# Patient Record
Sex: Male | Born: 1940 | Marital: Married | Smoking: Former smoker
Health system: Southern US, Community
[De-identification: ages and names within clinical notes are randomized; demographics above are authoritative.]

## PROBLEM LIST (undated history)

## (undated) DIAGNOSIS — E119 Type 2 diabetes mellitus without complications: Secondary | ICD-10-CM

## (undated) DIAGNOSIS — C3490 Malignant neoplasm of unspecified part of unspecified bronchus or lung: Secondary | ICD-10-CM

## (undated) HISTORY — DX: Malignant neoplasm of unspecified part of unspecified bronchus or lung: C34.90

## (undated) HISTORY — DX: Type 2 diabetes mellitus without complications: E11.9

## (undated) SURGERY — BRONCHOSCOPY, WITH FLUOROSCOPY
Anesthesia: Moderate Sedation

---

## 2009-04-04 DEATH — deceased

## 2017-02-23 ENCOUNTER — Other Ambulatory Visit (HOSPITAL_BASED_OUTPATIENT_CLINIC_OR_DEPARTMENT_OTHER): Payer: Self-pay | Admitting: Physician Assistant

## 2017-02-23 DIAGNOSIS — R918 Other nonspecific abnormal finding of lung field: Secondary | ICD-10-CM

## 2017-02-24 ENCOUNTER — Encounter (HOSPITAL_BASED_OUTPATIENT_CLINIC_OR_DEPARTMENT_OTHER): Payer: Self-pay

## 2017-02-24 ENCOUNTER — Ambulatory Visit (HOSPITAL_BASED_OUTPATIENT_CLINIC_OR_DEPARTMENT_OTHER)
Admission: RE | Admit: 2017-02-24 | Discharge: 2017-02-24 | Disposition: A | Discharge status: Home / Self Care | Payer: Medicare Other | Source: Ambulatory Visit | Attending: Physician Assistant | Admitting: Physician Assistant

## 2017-02-24 ENCOUNTER — Other Ambulatory Visit (HOSPITAL_COMMUNITY): Payer: Self-pay | Admitting: Physician Assistant

## 2017-02-24 DIAGNOSIS — I700 Atherosclerosis of aorta: Secondary | ICD-10-CM | POA: Diagnosis not present

## 2017-02-24 DIAGNOSIS — I2510 Atherosclerotic heart disease of native coronary artery without angina pectoris: Secondary | ICD-10-CM | POA: Insufficient documentation

## 2017-02-24 DIAGNOSIS — D491 Neoplasm of unspecified behavior of respiratory system: Secondary | ICD-10-CM | POA: Diagnosis not present

## 2017-02-24 DIAGNOSIS — K760 Fatty (change of) liver, not elsewhere classified: Secondary | ICD-10-CM | POA: Diagnosis not present

## 2017-02-24 DIAGNOSIS — J90 Pleural effusion, not elsewhere classified: Secondary | ICD-10-CM | POA: Insufficient documentation

## 2017-02-24 DIAGNOSIS — R918 Other nonspecific abnormal finding of lung field: Secondary | ICD-10-CM

## 2017-02-24 DIAGNOSIS — C3492 Malignant neoplasm of unspecified part of left bronchus or lung: Secondary | ICD-10-CM

## 2017-02-24 DIAGNOSIS — J432 Centrilobular emphysema: Secondary | ICD-10-CM | POA: Insufficient documentation

## 2017-02-24 DIAGNOSIS — J439 Emphysema, unspecified: Secondary | ICD-10-CM | POA: Diagnosis not present

## 2017-02-24 MED ORDER — IOPAMIDOL (ISOVUE-300) INJECTION 61%
100.0000 mL | Freq: Once | INTRAVENOUS | Status: AC | PRN
  Administered 2017-02-24: 80 mL via INTRAVENOUS

## 2017-03-05 ENCOUNTER — Telehealth: Payer: Self-pay | Admitting: *Deleted

## 2017-03-05 DIAGNOSIS — R918 Other nonspecific abnormal finding of lung field: Secondary | ICD-10-CM | POA: Insufficient documentation

## 2017-03-05 NOTE — Telephone Encounter (Signed)
Oncology Nurse Navigator Documentation  Oncology Nurse Navigator Flowsheets 03/05/2017  Navigator Location [facility]  Referral date to RadOnc/MedOnc 03/05/2017  Navigator Encounter Type Telephone/I received referral on Maurice Tyler today. I called and scheduled him to be seen at [facility] next week on 03/12/17.    Telephone Outgoing Call  Treatment Phase Abnormal Scans  Barriers/Navigation Needs Coordination of Care  Interventions Coordination of Care  Coordination of Care Appts  Acuity Level 1  Time Spent with Patient 30

## 2017-03-07 ENCOUNTER — Encounter (HOSPITAL_COMMUNITY): Payer: Self-pay

## 2017-03-07 ENCOUNTER — Ambulatory Visit (HOSPITAL_COMMUNITY)
Admission: RE | Admit: 2017-03-07 | Discharge: 2017-03-07 | Disposition: A | Discharge status: Home / Self Care | Payer: Medicare Other | Source: Ambulatory Visit | Attending: Physician Assistant | Admitting: Physician Assistant

## 2017-03-07 DIAGNOSIS — Z1389 Encounter for screening for other disorder: Secondary | ICD-10-CM | POA: Diagnosis present

## 2017-03-07 DIAGNOSIS — C3492 Malignant neoplasm of unspecified part of left bronchus or lung: Secondary | ICD-10-CM

## 2017-03-07 LAB — GLUCOSE, CAPILLARY
GLUCOSE-CAPILLARY: 278 mg/dL — AB (ref 65–99)
Glucose-Capillary: 262 mg/dL — ABNORMAL HIGH (ref 65–99)

## 2017-03-09 ENCOUNTER — Encounter: Payer: Self-pay | Admitting: *Deleted

## 2017-03-09 ENCOUNTER — Telehealth: Payer: Self-pay | Admitting: *Deleted

## 2017-03-09 NOTE — Telephone Encounter (Signed)
Oncology Nurse Navigator Documentation  Oncology Nurse Navigator Flowsheets 03/09/2017  Navigator Location [facility]  Navigator Encounter Type Telephone/I called today to follow up on Mr. Insley PET scan. It was supposed to be on 11/3.  He states his blood sugar was to high for scan.  He is going to see his doctor today regarding his sugar and will re-schedule his PET scan.  His appt on 11/8 will be cancelled and re-scheduled after his PET scan.  He verbalized understanding  Telephone Outgoing Call  Treatment Phase Abnormal Scans  Barriers/Navigation Needs Coordination of Care  Interventions Coordination of Care  Coordination of Care Other  Acuity Level 1  Time Spent with Patient 30

## 2017-03-10 ENCOUNTER — Telehealth: Payer: Self-pay | Admitting: *Deleted

## 2017-03-10 ENCOUNTER — Encounter: Payer: Self-pay | Admitting: Pulmonary Disease

## 2017-03-10 ENCOUNTER — Ambulatory Visit (INDEPENDENT_AMBULATORY_CARE_PROVIDER_SITE_OTHER): Payer: Medicare Other | Admitting: Pulmonary Disease

## 2017-03-10 ENCOUNTER — Encounter: Payer: Self-pay | Admitting: *Deleted

## 2017-03-10 VITALS — BP 124/64 | HR 90 | Ht 70.0 in | Wt 182.2 lb

## 2017-03-10 DIAGNOSIS — J90 Pleural effusion, not elsewhere classified: Secondary | ICD-10-CM | POA: Diagnosis not present

## 2017-03-10 DIAGNOSIS — R918 Other nonspecific abnormal finding of lung field: Secondary | ICD-10-CM

## 2017-03-10 NOTE — Progress Notes (Signed)
   Subjective:    Patient ID: Maurice Tyler, male    DOB: 1940-11-13, 76 y.o.   MRN: [id number]  HPI    Review of Systems  Constitutional: Positive for unexpected weight change. Negative for fever.  HENT: Negative for congestion, dental problem, ear pain, nosebleeds, postnasal drip, rhinorrhea, sinus pressure, sneezing, sore throat and trouble swallowing.   Eyes: Negative for redness and itching.  Respiratory: Positive for shortness of breath and wheezing. Negative for cough and chest tightness.   Cardiovascular: Negative for palpitations and leg swelling.  Gastrointestinal: Negative for nausea and vomiting.  Genitourinary: Negative for dysuria.  Musculoskeletal: Negative for joint swelling.  Skin: Negative for rash.  Allergic/Immunologic: Negative.  Negative for environmental allergies, food allergies and immunocompromised state.  Neurological: Negative for headaches.  Hematological: Does not bruise/bleed easily.  Psychiatric/Behavioral: Negative for dysphoric mood. The patient is not nervous/anxious.        Objective:   Physical Exam        Assessment & Plan:

## 2017-03-10 NOTE — Progress Notes (Signed)
Subjective:    Patient ID: Maurice Tyler, male    DOB: 1940-05-13, 76 y.o.   MRN: [id number]  Synopsis: Referred in 2018 in for a lung mass.  HPI Chief Complaint  Patient presents with  . pulm consult    Pt referred by Dr. Mat Carne for pulmonary Mass with possible PE. Pt having SOB with exertion with some wheezing on occassion.    Andray tells me that he was traveling recently with his RV and last week he was up in [place] and he had the sudden onset of cough and dyspnea.  Since then he started to fee a little better.  This was about 3 months ago.  He quit smoking in July and since then he has noticed some improvement in his dyspnea.  He says that there is still something in his chest.  No chest pain, still some dyspnea but it is not as bad recently.  He denies cough or hemoptysis.  He is actually gained 10 pounds in the last several weeks.  He tells me that he started smoking in 1960 when he joined [employer] and smoked 1 pack/day for 58 years until 2018.  He has always worked in Clinical cytogeneticist.  He says that early on in his career he had a significant amount of exposure to jet fuel's, installation used within the jet aircraft, and other chemicals that were used in the jet engine.  After this he worked Mud[employer].  He has never been told that he has a lung problem such as asthma or emphysema.   History reviewed. No pertinent past medical history.   Family History  Problem Relation Age of Onset  . Cancer Mother      Social History   Socioeconomic History  . Marital status: Married    Spouse name: Not on file  . Number of children: Not on file  . Years of education: Not on file  . Highest education level: Not on file  Social Needs  . Financial resource strain: Not on file  . Food insecurity - worry: Not on file  . Food insecurity - inability: Not on file  . Transportation needs - medical: Not on file  . Transportation needs - non-medical: Not on file    Occupational History  . Not on file  Tobacco Use  . Smoking status: Former Smoker    Years: 58.00    Types: Cigarettes    Last attempt to quit: 11/21/2016    Years since quitting: 0.2  . Smokeless tobacco: Never Used  Substance and Sexual Activity  . Alcohol use: No    Frequency: Never  . Drug use: No  . Sexual activity: Not on file  Other Topics Concern  . Not on file  Social History Narrative  . Not on file     No Known Allergies   Outpatient Medications Prior to Visit  Medication Sig Dispense Refill  . albuterol (PROVENTIL HFA;VENTOLIN HFA) 108 (90 Base) MCG/ACT inhaler Inhale 1 puff every 6 (six) hours as needed into the lungs for wheezing or shortness of breath.    . metFORMIN (GLUCOPHAGE) 850 MG tablet Take 850 mg 1 day or 1 dose by mouth.     No facility-administered medications prior to visit.       Review of Systems  Constitutional: Negative for activity change, appetite change, chills and fever.  HENT: Negative for congestion, ear pain, hearing loss, postnasal drip, rhinorrhea, sinus pressure and sneezing.   Eyes: Negative for  redness, itching and visual disturbance.  Respiratory: Positive for shortness of breath. Negative for cough, chest tightness and wheezing.   Cardiovascular: Negative for chest pain, palpitations and leg swelling.  Gastrointestinal: Negative for abdominal distention, abdominal pain, blood in stool, constipation, diarrhea, nausea and vomiting.  Musculoskeletal: Negative for arthralgias, gait problem, joint swelling, myalgias, neck pain and neck stiffness.  Skin: Negative for rash.  Neurological: Negative for dizziness, light-headedness, numbness and headaches.  Hematological: Does not bruise/bleed easily.  Psychiatric/Behavioral: Negative for confusion and dysphoric mood.       Objective:   Physical Exam  Vitals:   03/10/17 1127  BP: 124/64  Pulse: 90  SpO2: 95%  Weight: 182 lb 3.2 oz (82.6 kg)  Height: 5\' 10"  (1.778 m)     Gen: well appearing, no acute distress HENT: NCAT, OP clear, neck supple without masses Eyes: PERRL, EOMi Lymph: no cervical lymphadenopathy PULM: Diminished left lung upper lobe CV: RRR, no mgr, no JVD GI: BS+, soft, nontender, no hsm Derm: no rash or skin breakdown MSK: normal bulk and tone Neuro: A&Ox4, CN II-XII intact, strength 5/5 in all 4 extremities Psyche: normal mood and affect   CBC No results found for: WBC, RBC, HGB, HCT, PLT, MCV, MCH, MCHC, RDW, LYMPHSABS, MONOABS, EOSABS, BASOSABS  Chest imaging: October 2018 CT chest images independently reviewed showing what appears to be a large left upper lobe mass with complete collapse of the left upper lobe this lesion is encasing the pulmonary artery and extending into the subcarinal region of the mediastinum.  There is also a moderate-sized pleural effusion on that side.  He also has mild centrilobular emphysema      Assessment & Plan:    Lung mass - Plan: IR THORACENTESIS ASP PLEURAL SPACE W/IMG GUIDE Pleural fluid  Discussion: I'm very worried about Mr. Teale CT chest.  He has a large left lung mass with a pleural effusion.  Given his smoking history this most likely represents malignancy.  I explained this to him in detail today.  The best approach moving forward is to obtain a thoracentesis to assess the pleural fluid for malignancy.  If that is negative or inconclusive then he will need a bronchoscopy.  He should keep the appointment for the PET scan as previously arranged.  Plan: Lung mass with pleural effusion: I am concerned that this is cancer, but the only way to know for certain is to perform a biopsy We will plan for procedure called a thoracentesis where we will drain fluid from your left lung and send it to the lab If that test is inconclusive then I will make arrangements for a bronchoscopy next week  You will hear from [place] after the results of the thoracentesis are available    Current  Outpatient Medications:  .  albuterol (PROVENTIL HFA;VENTOLIN HFA) 108 (90 Base) MCG/ACT inhaler, Inhale 1 puff every 6 (six) hours as needed into the lungs for wheezing or shortness of breath., Disp: , Rfl:  .  metFORMIN (GLUCOPHAGE) 850 MG tablet, Take 850 mg 1 day or 1 dose by mouth., Disp: , Rfl:

## 2017-03-10 NOTE — Patient Instructions (Signed)
Lung mass with pleural effusion: I am concerned that this is cancer, but the only way to know for certain is to perform a biopsy We will plan for procedure called a thoracentesis where we will drain fluid from your left lung and send it to the lab If that test is inconclusive then I will make arrangements for a bronchoscopy next week  You will hear from [place] after the results of the thoracentesis are available

## 2017-03-12 ENCOUNTER — Other Ambulatory Visit: Payer: TRICARE For Life (TFL)

## 2017-03-12 ENCOUNTER — Ambulatory Visit: Payer: TRICARE For Life (TFL) | Admitting: Internal Medicine

## 2017-03-16 ENCOUNTER — Ambulatory Visit (HOSPITAL_COMMUNITY)
Admission: RE | Admit: 2017-03-16 | Discharge: 2017-03-16 | Disposition: A | Discharge status: Home / Self Care | Payer: Medicare Other | Source: Ambulatory Visit | Attending: Radiology | Admitting: Radiology

## 2017-03-16 ENCOUNTER — Ambulatory Visit (HOSPITAL_COMMUNITY)
Admission: RE | Admit: 2017-03-16 | Discharge: 2017-03-16 | Disposition: A | Discharge status: Home / Self Care | Payer: Medicare Other | Source: Ambulatory Visit | Attending: Pulmonary Disease | Admitting: Pulmonary Disease

## 2017-03-16 ENCOUNTER — Encounter (HOSPITAL_COMMUNITY): Payer: Self-pay | Admitting: Radiology

## 2017-03-16 DIAGNOSIS — I700 Atherosclerosis of aorta: Secondary | ICD-10-CM | POA: Insufficient documentation

## 2017-03-16 DIAGNOSIS — J90 Pleural effusion, not elsewhere classified: Secondary | ICD-10-CM | POA: Diagnosis present

## 2017-03-16 DIAGNOSIS — R918 Other nonspecific abnormal finding of lung field: Secondary | ICD-10-CM

## 2017-03-16 HISTORY — PX: IR THORACENTESIS ASP PLEURAL SPACE W/IMG GUIDE: IMG5380

## 2017-03-16 MED ORDER — LIDOCAINE 2% (20 MG/ML) 5 ML SYRINGE
INTRAMUSCULAR | Status: AC
  Filled 2017-03-16: qty 10

## 2017-03-16 NOTE — Procedures (Signed)
PROCEDURE SUMMARY:  Successful US guided left thoracentesis. Yielded 1.4 L of slightly hazy yellow fluid. Pt tolerated procedure well. No immediate complications.  Specimen was sent for labs. CXR ordered.  Ascencion Dike PA-C 03/16/2017 1:31 PM

## 2017-03-17 ENCOUNTER — Ambulatory Visit: Payer: TRICARE For Life (TFL) | Admitting: Internal Medicine

## 2017-03-17 ENCOUNTER — Telehealth: Payer: Self-pay | Admitting: Internal Medicine

## 2017-03-17 NOTE — Telephone Encounter (Signed)
Received a call from [facility] at [facility] to reschedule the pt's appt. Pt wanted to be rescheduled after PET scan on 11/19. Appt has been rescheduled for the pt to see Dr. [name] on 11/26 at 215pm, 145pm labs. Tammi Klippel will notify the pt.

## 2017-03-20 ENCOUNTER — Telehealth: Payer: Self-pay | Admitting: Pulmonary Disease

## 2017-03-20 NOTE — Telephone Encounter (Signed)
I called Mr. Hamre to discuss the results of his cytology report.  So far no evidence of cancer but some stains are still pending.  Based on this I explained to him that we need to go ahead and arrange for a bronchoscopy at [facility] at some point on Monday, Tue, Wed, or Friday of next week. I would prefer that it be performed earlier in the week so that we can get results sooner.  Will forward to our triage team to start working on this.

## 2017-03-20 NOTE — Telephone Encounter (Signed)
Bronch has been scheduled for 03/25/17 at 8:00a at [facility]. Pt is aware and voiced his understanding.   Will route to BQ to make aware.

## 2017-03-21 NOTE — Telephone Encounter (Signed)
Malachy Moan, The other patient I called you about is Morton Stall #[id number]. Thank you. [name]

## 2017-03-23 ENCOUNTER — Encounter (HOSPITAL_COMMUNITY)
Admission: RE | Admit: 2017-03-23 | Discharge: 2017-03-23 | Disposition: A | Discharge status: Home / Self Care | Payer: Medicare Other | Source: Ambulatory Visit | Attending: Physician Assistant | Admitting: Physician Assistant

## 2017-03-23 DIAGNOSIS — C3492 Malignant neoplasm of unspecified part of left bronchus or lung: Secondary | ICD-10-CM | POA: Diagnosis present

## 2017-03-23 LAB — GLUCOSE, CAPILLARY: Glucose-Capillary: 184 mg/dL — ABNORMAL HIGH (ref 65–99)

## 2017-03-23 MED ORDER — FLUDEOXYGLUCOSE F - 18 (FDG) INJECTION: 8.4000 | Freq: Once | INTRAVENOUS | Status: DC | PRN

## 2017-03-23 NOTE — Telephone Encounter (Signed)
Mohamed, Thanks for the note.  I sent you a note regarding [name]'s bronchoscopy today. Ruby Cola

## 2017-03-24 NOTE — Progress Notes (Signed)
Pt called and given instructions for bronchoscopy in am.  Pt was told to arrive at 06:45 am at the [facility]. Nothing to eat or drink after midnight.  Will bring wife as his adult driver. Pt was given a call back number of [phone].

## 2017-03-25 ENCOUNTER — Encounter (HOSPITAL_COMMUNITY)
Admission: RE | Disposition: A | Discharge status: Home / Self Care | Payer: Self-pay | Source: Ambulatory Visit | Attending: Pulmonary Disease

## 2017-03-25 ENCOUNTER — Ambulatory Visit (HOSPITAL_COMMUNITY)
Admission: RE | Admit: 2017-03-25 | Discharge: 2017-03-25 | Disposition: A | Discharge status: Home / Self Care | Payer: Medicare Other | Source: Ambulatory Visit | Attending: Pulmonary Disease | Admitting: Pulmonary Disease

## 2017-03-25 ENCOUNTER — Ambulatory Visit (HOSPITAL_COMMUNITY): Payer: Medicare Other

## 2017-03-25 ENCOUNTER — Encounter (HOSPITAL_COMMUNITY): Payer: Self-pay | Admitting: Respiratory Therapy

## 2017-03-25 DIAGNOSIS — R918 Other nonspecific abnormal finding of lung field: Secondary | ICD-10-CM

## 2017-03-25 DIAGNOSIS — J90 Pleural effusion, not elsewhere classified: Secondary | ICD-10-CM | POA: Diagnosis not present

## 2017-03-25 DIAGNOSIS — Z79899 Other long term (current) drug therapy: Secondary | ICD-10-CM | POA: Insufficient documentation

## 2017-03-25 DIAGNOSIS — Z7984 Long term (current) use of oral hypoglycemic drugs: Secondary | ICD-10-CM | POA: Diagnosis not present

## 2017-03-25 DIAGNOSIS — C3402 Malignant neoplasm of left main bronchus: Secondary | ICD-10-CM | POA: Diagnosis not present

## 2017-03-25 DIAGNOSIS — Z87891 Personal history of nicotine dependence: Secondary | ICD-10-CM | POA: Diagnosis not present

## 2017-03-25 DIAGNOSIS — Z9889 Other specified postprocedural states: Secondary | ICD-10-CM

## 2017-03-25 HISTORY — PX: VIDEO BRONCHOSCOPY: SHX5072

## 2017-03-25 LAB — CBC
HEMATOCRIT: 44.5 % (ref 39.0–52.0)
HEMOGLOBIN: 15.5 g/dL (ref 13.0–17.0)
MCH: 31.4 pg (ref 26.0–34.0)
MCHC: 34.8 g/dL (ref 30.0–36.0)
MCV: 90.1 fL (ref 78.0–100.0)
Platelets: 290 10*3/uL (ref 150–400)
RBC: 4.94 MIL/uL (ref 4.22–5.81)
RDW: 12.4 % (ref 11.5–15.5)
WBC: 9.2 10*3/uL (ref 4.0–10.5)

## 2017-03-25 LAB — GLUCOSE, CAPILLARY: Glucose-Capillary: 165 mg/dL — ABNORMAL HIGH (ref 65–99)

## 2017-03-25 SURGERY — BRONCHOSCOPY, WITH FLUOROSCOPY
Anesthesia: Moderate Sedation | Laterality: Bilateral

## 2017-03-25 MED ORDER — FENTANYL CITRATE (PF) 100 MCG/2ML IJ SOLN
INTRAMUSCULAR | Status: DC | PRN
  Administered 2017-03-25: 25 ug via INTRAVENOUS
  Administered 2017-03-25: 50 ug via INTRAVENOUS
  Administered 2017-03-25: 25 ug via INTRAVENOUS

## 2017-03-25 MED ORDER — LIDOCAINE HCL 1 % IJ SOLN
INTRAMUSCULAR | Status: DC | PRN
  Administered 2017-03-25: 6 mL via RESPIRATORY_TRACT

## 2017-03-25 MED ORDER — MIDAZOLAM HCL 5 MG/ML IJ SOLN
INTRAMUSCULAR | Status: AC
  Filled 2017-03-25: qty 2

## 2017-03-25 MED ORDER — LIDOCAINE HCL 2 % EX GEL: 1.0000 "application " | Freq: Once | CUTANEOUS | Status: DC

## 2017-03-25 MED ORDER — FENTANYL CITRATE (PF) 100 MCG/2ML IJ SOLN
INTRAMUSCULAR | Status: AC
  Filled 2017-03-25: qty 4

## 2017-03-25 MED ORDER — LIDOCAINE HCL 2 % EX GEL
CUTANEOUS | Status: DC | PRN
  Administered 2017-03-25: 1

## 2017-03-25 MED ORDER — SODIUM CHLORIDE 0.9 % IV SOLN
INTRAVENOUS | Status: DC
  Administered 2017-03-25: 08:00:00 via INTRAVENOUS

## 2017-03-25 MED ORDER — PHENYLEPHRINE HCL 0.25 % NA SOLN
NASAL | Status: DC | PRN
  Administered 2017-03-25: 2 via NASAL

## 2017-03-25 MED ORDER — PHENYLEPHRINE HCL 0.25 % NA SOLN: 1.0000 | Freq: Four times a day (QID) | NASAL | Status: DC | PRN

## 2017-03-25 MED ORDER — BUTAMBEN-TETRACAINE-BENZOCAINE 2-2-14 % EX AERO: 1.0000 | INHALATION_SPRAY | Freq: Once | CUTANEOUS | Status: DC

## 2017-03-25 MED ORDER — MIDAZOLAM HCL 10 MG/2ML IJ SOLN
INTRAMUSCULAR | Status: DC | PRN
  Administered 2017-03-25 (×3): 1 mg via INTRAVENOUS
  Administered 2017-03-25: 2 mg via INTRAVENOUS

## 2017-03-25 NOTE — Discharge Instructions (Signed)
Flexible Bronchoscopy, Care After These instructions give you information on caring for yourself after your procedure. Your doctor may also give you more specific instructions. Call your doctor if you have any problems or questions after your procedure. Follow these instructions at home:  Do not eat or drink anything for 2 hours after your procedure. If you try to eat or drink before the medicine wears off, food or drink could go into your lungs. You could also burn yourself.  After 2 hours have passed and when you can cough and gag normally, you may eat soft food and drink liquids slowly.  The day after the test, you may eat your normal diet.  You may do your normal activities.  Keep all doctor visits. Get help right away if:  You get more and more short of breath.  You get light-headed.  You feel like you are going to pass out (faint).  You have chest pain.  You have new problems that worry you.  You cough up more than a little blood.  You cough up more blood than before. This information is not intended to replace advice given to you by your health care provider. Make sure you discuss any questions you have with your health care provider. Document Released: 02/16/2009 Document Revised: 09/27/2015 Document Reviewed: 12/24/2012 Elsevier Interactive Patient Education  2017 [employer].  Nothing to eat or drink until  11:00 am today   03/25/2017.

## 2017-03-25 NOTE — H&P (Signed)
Maurice Tyler is an 76 y.o. male.   Chief Complaint: lung mass  HPI: 76 y/o smoker presented to clinic with cough, found to have a left pleural effusion and lung mass.  Thoracentesis was non-diagnostic.  Here for bronchoscopy today.  History reviewed. No pertinent past medical history.  Past Surgical History:  Procedure Laterality Date  . IR THORACENTESIS ASP PLEURAL SPACE W/IMG GUIDE  03/16/2017    Family History  Problem Relation Age of Onset  . Cancer Mother    Social History:  reports that he quit smoking about 4 months ago. His smoking use included cigarettes. He quit after 58.00 years of use. he has never used smokeless tobacco. He reports that he does not drink alcohol or use drugs.  Allergies: No Known Allergies  Medications Prior to Admission  Medication Sig Dispense Refill  . albuterol (PROVENTIL HFA;VENTOLIN HFA) 108 (90 Base) MCG/ACT inhaler Inhale 1 puff every 6 (six) hours as needed into the lungs for wheezing or shortness of breath.    . cetirizine (ZYRTEC) 10 MG tablet Take 10 mg daily by mouth. ALLERTEC    . metFORMIN (GLUCOPHAGE) 500 MG tablet Take 500 mg 2 (two) times daily with a meal by mouth.       Results for orders placed or performed during the hospital encounter of 03/23/17 (from the past 48 hour(s))  Glucose, capillary     Status: Abnormal   Collection Time: 03/23/17  8:44 AM  Result Value Ref Range   Glucose-Capillary 184 (H) 65 - 99 mg/dL   Nm Pet Image Initial (pi) Skull Base To Thigh  Result Date: 03/23/2017 CLINICAL DATA:  Initial treatment strategy for left lung mass and pleural effusion. EXAM: NUCLEAR MEDICINE PET SKULL BASE TO THIGH TECHNIQUE: 8.4 mCi F-18 FDG was injected intravenously. Full-ring PET imaging was performed from the skull base to thigh after the radiotracer. CT data was obtained and used for attenuation correction and anatomic localization. FASTING BLOOD GLUCOSE:  Value: 184 mg/dl COMPARISON:  Chest CT on 02/24/2017 FINDINGS:  NECK:  No hypermetabolic lymph nodes or masses. CHEST: A large hypermetabolic mass is seen within the central left lung, which involves the left hilum and mediastinum. This causes obstruction of the left mainstem bronchus, and complete postobstructive collapse of the left lung. This mass is difficult to measure due to surrounding left lung collapse, but has SUV max of 12.6. Moderate left pleural effusion is seen, without evidence of hypermetabolic chest wall mass. No hypermetabolic right hilar or supraclavicular lymph nodes. Mild right lung emphysema noted, however no suspicious right lung nodules or masses identified. ABDOMEN/PELVIS: No abnormal hypermetabolic activity within the liver, pancreas, adrenal glands, or spleen. No hypermetabolic lymph nodes in the abdomen or pelvis. Moderately enlarged prostate gland is seen with relatively diffuse hypermetabolic activity, with SUV max of 7.7. This may be due to prostatitis or high-grade prostate carcinoma. Multiple calcified gallstones are seen as well as mild to moderate diffuse hepatic steatosis. Aortic atherosclerosis. Sigmoid diverticulosis, without evidence of diverticulitis. SKELETON: No focal hypermetabolic bone lesions to suggest skeletal metastasis. IMPRESSION: Large central hypermetabolic left lung mass with involvement of the left hilum and mediastinum, and complete postobstructive left lung collapse. Moderate left pleural effusion, likely malignant. Recommend correlation with cytology. No evidence of extra-thoracic metastatic disease. Moderately enlarged hypermetabolic prostate gland, which may be due to prostatitis or high-grade prostate carcinoma. Recommend correlation with PSA, and consider prostate MRI or biopsy. Other incidental findings described above. Electronically Signed   By: Sharrie Rothman.D.  On: 03/23/2017 12:06    ROS  Blood pressure 135/65, temperature 97.7 F (36.5 C), resp. rate (!) 22, SpO2 96 %. Physical Exam   Gen: well  appearing HENT: OP clear, TM's clear, neck supple PULM: Diminished left lung B, normal percussion CV: RRR, no mgr, trace edema GI: BS+, soft, nontender Derm: no cyanosis or rash Psyche: normal mood and affect   CBC    Component Value Date/Time   WBC 9.2 03/25/2017 0810   RBC 4.94 03/25/2017 0810   HGB 15.5 03/25/2017 0810   HCT 44.5 03/25/2017 0810   PLT 290 03/25/2017 0810   MCV 90.1 03/25/2017 0810   MCH 31.4 03/25/2017 0810   MCHC 34.8 03/25/2017 0810   RDW 12.4 03/25/2017 0810   PET CT images reviewed, showing left lung mass which is hypermetabolic    Assessment/Plan  Lung mass: plan bronchoscopy, will send stat CBC to assess PLT count  Simonne Maffucci, MD 03/25/2017, 7:52 AM

## 2017-03-25 NOTE — Progress Notes (Signed)
Video Bronchoscopy done' Intervention Bronchial washing  Intervention bronchial biopsy Intervention Bronchial  Brushing  Procedure tolerated well

## 2017-03-25 NOTE — Op Note (Signed)
[name] Cardiopulmonary Patient Name: Maurice Tyler Procedure Date: 03/25/2017 MRN: [id number] Attending MD: Juanito Doom , MD Date of Birth: 1941-04-15 CSN: [id number] Age: 76 Admit Type: Outpatient Ethnicity: Unknown Procedure:            Bronchoscopy Indications:          Left mainstem mass Providers:            Nathaneil Canary B. Lake Bells, MD, Andre Lefort RRT,RCP,                        Ashley Mariner RRT,RCP Referring MD:          Medicines:            Midazolam 5 mg IV, Fentanyl 100 mcg IV, Lidocaine                        applied to nares and subglottic space Complications:        No immediate complications Estimated Blood Loss: Estimated blood loss was minimal. Procedure:      Pre-Anesthesia Assessment:      - A History and Physical has been performed. Patient meds and allergies       have been reviewed. The risks and benefits of the procedure and the       sedation options and risks were discussed with the patient. All       questions were answered and informed consent was obtained. Patient       identification and proposed procedure were verified prior to the       procedure by the physician and the technician in the procedure room.       Mental Status Examination: alert and oriented. Airway Examination:       normal oropharyngeal airway. Respiratory Examination: clear to       auscultation. CV Examination: normal. ASA Grade Assessment: II - A       patient with mild systemic disease. After reviewing the risks and       benefits, the patient was deemed in satisfactory condition to undergo       the procedure. The anesthesia plan was to use moderate sedation /       analgesia (conscious sedation). Immediately prior to administration of       medications, the patient was re-assessed for adequacy to receive       sedatives. The heart rate, respiratory rate, oxygen saturations, blood       pressure, adequacy of pulmonary ventilation, and response to  care were       monitored throughout the procedure. The physical status of the patient       was re-assessed after the procedure.      After obtaining informed consent, the bronchoscope was passed under       direct vision. Throughout the procedure, the patient's blood pressure,       pulse, and oxygen saturations were monitored continuously. the [id number]       ([id number]) scope was introduced through the left nostril and advanced to       the tracheobronchial tree. The procedure was accomplished without       difficulty. The patient tolerated the procedure well. The total duration       of the procedure was 15 minutes. Findings:      The nasopharynx/oropharynx appears normal. The larynx had a small nodule       on the left see  image. The vocal cords appear normal. The subglottic       space is normal. The trachea is of normal caliber. The carina is sharp.       The tracheobronchial tree of the right lung was examined to at least the       first subsegmental level. Bronchial mucosa and anatomy in the right lung       are normal; there are no endobronchial lesions, and no secretions.      Left Lung Abnormalities: A partially obstructing (about 90% obstructed)       mass was found in the middle portion in the left mainstem bronchus. The       mass was large and bloody, endobronchial and friable. The lesion was not       traversed. Endobronchial biopsies were performed in the left mainstem       bronchus using forceps and sent for histopathology examination. 6 were       obtained. Fluoroscopically guided transbronchial brushings were obtained       in the left mainstem bronchus and sent for routine cytology. Three       samples were obtained. Washings were obtained in the left mainstem       bronchus and sent for routine cytology. The return was blood-tinged. Impression:      - Left laryngeal nodule      - Left mainstem mass      - The right lung was normal.      - A bloody, endobronchial and  friable mass was found in the left       mainstem bronchus. This lesion is likely malignant.      - An endobronchial biopsy was performed.      - Fluoroscopically guided transbronchial brushings were obtained.      - Washings were obtained. Moderate Sedation:      Moderate (conscious) sedation was personally administered by the       endoscopist. The following parameters were monitored: oxygen saturation,       heart rate, blood pressure, and response to care. Total physician       intraservice time was 24 minutes. Recommendation:      - Await biopsy and cytology results. Procedure Code(s):      --- Professional ---      [phone], Bronchoscopy, rigid or flexible, including fluoroscopic guidance,       when performed; with bronchial or endobronchial biopsy(s), single or       multiple sites      31623, Bronchoscopy, rigid or flexible, including fluoroscopic guidance,       when performed; with brushing or protected brushings      99152, Moderate sedation services provided by the same physician or       other qualified health care professional performing the diagnostic or       therapeutic service that the sedation supports, requiring the presence       of an independent trained observer to assist in the monitoring of the       patient's level of consciousness and physiological status; initial 15       minutes of intraservice time, patient age 26 years or older      [phone], Moderate sedation services; each additional 15 minutes       intraservice time Diagnosis Code(s):      --- Professional ---      R91.8, Other nonspecific abnormal finding of lung field      J98.9,  Respiratory disorder, unspecified CPT copyright 2016 American Medical Association. All rights reserved. The codes documented in this report are preliminary and upon coder review may  be revised to meet current compliance requirements. Norlene Campbell, MD Juanito Doom, MD 03/25/2017 9:18:37 AM This report has been  signed electronically. Number of Addenda: 0 Scope In: 8:52:06 AM Scope Out: 9:07:07 AM

## 2017-03-27 ENCOUNTER — Telehealth: Payer: Self-pay | Admitting: Pulmonary Disease

## 2017-03-27 ENCOUNTER — Encounter (HOSPITAL_COMMUNITY): Payer: Self-pay | Admitting: Pulmonary Disease

## 2017-03-27 NOTE — Telephone Encounter (Signed)
Thank you :)

## 2017-03-27 NOTE — Telephone Encounter (Signed)
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  Further tissue stains are pending.  I requested PDL-1 testing.  He will see Dr. [name] next week.  He voiced understanding.

## 2017-03-30 ENCOUNTER — Ambulatory Visit (HOSPITAL_BASED_OUTPATIENT_CLINIC_OR_DEPARTMENT_OTHER): Payer: Medicare Other | Admitting: Internal Medicine

## 2017-03-30 ENCOUNTER — Other Ambulatory Visit (HOSPITAL_BASED_OUTPATIENT_CLINIC_OR_DEPARTMENT_OTHER): Payer: Medicare Other

## 2017-03-30 ENCOUNTER — Encounter: Payer: Self-pay | Admitting: Internal Medicine

## 2017-03-30 VITALS — BP 136/59 | HR 114 | Temp 97.6°F | Resp 20 | Ht 70.0 in | Wt 166.7 lb

## 2017-03-30 DIAGNOSIS — C3412 Malignant neoplasm of upper lobe, left bronchus or lung: Secondary | ICD-10-CM

## 2017-03-30 DIAGNOSIS — C3492 Malignant neoplasm of unspecified part of left bronchus or lung: Secondary | ICD-10-CM

## 2017-03-30 DIAGNOSIS — J910 Malignant pleural effusion: Secondary | ICD-10-CM | POA: Diagnosis not present

## 2017-03-30 DIAGNOSIS — R5382 Chronic fatigue, unspecified: Secondary | ICD-10-CM

## 2017-03-30 DIAGNOSIS — C3490 Malignant neoplasm of unspecified part of unspecified bronchus or lung: Secondary | ICD-10-CM

## 2017-03-30 DIAGNOSIS — R918 Other nonspecific abnormal finding of lung field: Secondary | ICD-10-CM

## 2017-03-30 DIAGNOSIS — Z7189 Other specified counseling: Secondary | ICD-10-CM

## 2017-03-30 DIAGNOSIS — R634 Abnormal weight loss: Secondary | ICD-10-CM | POA: Diagnosis not present

## 2017-03-30 DIAGNOSIS — E46 Unspecified protein-calorie malnutrition: Secondary | ICD-10-CM | POA: Diagnosis not present

## 2017-03-30 DIAGNOSIS — Z5111 Encounter for antineoplastic chemotherapy: Secondary | ICD-10-CM

## 2017-03-30 DIAGNOSIS — Z5112 Encounter for antineoplastic immunotherapy: Secondary | ICD-10-CM

## 2017-03-30 HISTORY — DX: Malignant neoplasm of unspecified part of unspecified bronchus or lung: C34.90

## 2017-03-30 LAB — COMPREHENSIVE METABOLIC PANEL
ALBUMIN: 3.4 g/dL — AB (ref 3.5–5.0)
ALK PHOS: 88 U/L (ref 40–150)
ALT: 15 U/L (ref 0–55)
AST: 13 U/L (ref 5–34)
Anion Gap: 14 mEq/L — ABNORMAL HIGH (ref 3–11)
BUN: 17.7 mg/dL (ref 7.0–26.0)
CALCIUM: 10.2 mg/dL (ref 8.4–10.4)
CO2: 26 mEq/L (ref 22–29)
Chloride: 98 mEq/L (ref 98–109)
Creatinine: 0.9 mg/dL (ref 0.7–1.3)
GLUCOSE: 131 mg/dL (ref 70–140)
POTASSIUM: 5 meq/L (ref 3.5–5.1)
SODIUM: 138 meq/L (ref 136–145)
Total Bilirubin: 0.54 mg/dL (ref 0.20–1.20)
Total Protein: 7.2 g/dL (ref 6.4–8.3)

## 2017-03-30 LAB — CBC WITH DIFFERENTIAL/PLATELET
BASO%: 0.4 % (ref 0.0–2.0)
Basophils Absolute: 0 10*3/uL (ref 0.0–0.1)
EOS%: 0.4 % (ref 0.0–7.0)
Eosinophils Absolute: 0 10*3/uL (ref 0.0–0.5)
HEMATOCRIT: 46.7 % (ref 38.4–49.9)
HGB: 15.7 g/dL (ref 13.0–17.1)
LYMPH#: 1.2 10*3/uL (ref 0.9–3.3)
LYMPH%: 12.8 % — ABNORMAL LOW (ref 14.0–49.0)
MCH: 30.4 pg (ref 27.2–33.4)
MCHC: 33.6 g/dL (ref 32.0–36.0)
MCV: 90.3 fL (ref 79.3–98.0)
MONO#: 1 10*3/uL — AB (ref 0.1–0.9)
MONO%: 10.6 % (ref 0.0–14.0)
NEUT%: 75.8 % — ABNORMAL HIGH (ref 39.0–75.0)
NEUTROS ABS: 7.2 10*3/uL — AB (ref 1.5–6.5)
PLATELETS: 292 10*3/uL (ref 140–400)
RBC: 5.17 10*6/uL (ref 4.20–5.82)
RDW: 12.9 % (ref 11.0–14.6)
WBC: 9.5 10*3/uL (ref 4.0–10.3)

## 2017-03-30 MED ORDER — PROCHLORPERAZINE MALEATE 10 MG PO TABS: 10.0000 mg | ORAL_TABLET | Freq: Four times a day (QID) | ORAL | 0 refills | Status: DC | PRN

## 2017-03-30 NOTE — Progress Notes (Signed)
START ON PATHWAY REGIMEN - Non-Small Cell Lung     A cycle is every 21 days:     Paclitaxel      Carboplatin   **Always confirm dose/schedule in your pharmacy ordering system**    Patient Characteristics: Stage IV Metastatic, Squamous, PS = 0, 1, First Line, PD-L1 Expression Positive 1-49% (TPS) / Negative / Not Tested / Not a Candidate for Immunotherapy/Awaiting Test Results AJCC T Category: T4 Current Disease Status: Distant Metastases AJCC N Category: N2 AJCC M Category: M1a AJCC 8 Stage Grouping: IVA Histology: Squamous Cell Line of therapy: First Line PD-L1 Expression Status: Awaiting Test Results Performance Status: PS = 0, 1 Would you be surprised if this patient died  in the next year<= I would NOT be surprised if this patient died in the next year Intent of Therapy: Non-Curative / Palliative Intent, Discussed with Patient

## 2017-03-30 NOTE — Progress Notes (Signed)
[facility] [phone]   [phone]  CONSULT NOTE  REFERRING PHYSICIAN: Dr. Simonne Maffucci  REASON FOR CONSULTATION:  76 years old white male recently diagnosed with lung cancer.  HPI Maurice Tyler is a 76 y.o. male with long history for smoking and past medical history significant for diabetes mellitus.  The patient mentions that 3 months ago while he was vacationing and traveling with his RV, he went out to smoke a cigarette and had severe cough to the point that he decided to quit smoking at that time.  He has also been complaining of persistent cough and shortness of breath since that time.  He also started losing weight.  He was seen by his primary care physician and CT scan of the chest was performed on February 24, 2017 and that showed a mass felt to arise from the left upper lobe and extend into and invades the left hilum, encasing the left main and left upper lobe pulmonary arteries.  There may be adjacent adenopathy in the left hilum and subcarinal regions.  The area of questionable adenopathy in the left hilar region measures 3.4 x 2.3 cm.  There is mass in the middle mediastinum which abuts and may invade the esophagus measuring 2.4 x 2.0 cm.  The scan also showed moderate pleural effusion on the left.  There was airspace consolidation throughout the left upper lobe with essentially collapsed left upper lobe likely due to tumor invading and obstructing the left main bronchus.  The area of tumor versus adjacent atelectasis/pneumonitis was difficult to ascertain.  The area felt to be most suspicious for tumor in the posterior segment of the left upper lobe medially and measured 4.1 x 3.3 cm.  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 On March 16, 2017 the patient underwent ultrasound-guided left thoracentesis with drainage of 1.4 L of pleural fluid.  The final cytology was not conclusive for malignancy. A PET scan was performed in March 23, 2017 and that showed large central hypermetabolic left lung mass with involvement of the left hilum and mediastinum and complete postobstructive left lung collapse.  There was also moderate left pleural effusion likely malignant.  There was also moderately enlarged hypermetabolic prostate gland which may be due to prostatitis or high-grade prostate carcinoma.  There was no evidence of extrathoracic metastatic disease. On April 24, 2017 the patient underwent bronchoscopy under the care of Dr. 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 Dr. Lake Bells kindly refer the patient to me today for evaluation and recommendation regarding treatment of his condition. When seen today the patient is feeling fine except for the weight loss of around 20 pounds in the last 3 months.  He denied having any chest pain, shortness of breath, cough or hemoptysis.  He denied having any nausea, vomiting, diarrhea or constipation.  He has no headache or visual changes. Family history significant for mother with liver cancer, father died from old age. The patient is married and has 2 children.  He used to work as in Office manager.  He has a history of smoking 1 pack/day for around 60 years and quit November 12, 2016.  The patient denied having any history of alcohol or drug abuse.  HPI  Past Medical History:  Diagnosis Date  . Diabetes mellitus without complication ([place])   . Malignant neoplasm of unspecified part of unspecified bronchus or lung ([place]) 03/30/2017    Past  Surgical History:  Procedure Laterality Date  . IR THORACENTESIS ASP PLEURAL SPACE W/IMG GUIDE  03/16/2017  . VIDEO BRONCHOSCOPY Bilateral 03/25/2017   Procedure: VIDEO BRONCHOSCOPY WITH FLUORO;  Surgeon: Juanito Doom, MD;  Location: [facility];  Service: Cardiopulmonary;  Laterality: Bilateral;    Family History  Problem Relation Age of Onset  .  Cancer Mother     Social History Social History   Tobacco Use  . Smoking status: Former Smoker    Years: 58.00    Types: Cigarettes    Last attempt to quit: 11/21/2016    Years since quitting: 0.3  . Smokeless tobacco: Never Used  Substance Use Topics  . Alcohol use: No    Frequency: Never  . Drug use: No    No Known Allergies  Current Outpatient Medications  Medication Sig Dispense Refill  . albuterol (PROVENTIL HFA;VENTOLIN HFA) 108 (90 Base) MCG/ACT inhaler Inhale 1 puff every 6 (six) hours as needed into the lungs for wheezing or shortness of breath.    . cetirizine (ZYRTEC) 10 MG tablet Take 10 mg daily by mouth. ALLERTEC    . metFORMIN (GLUCOPHAGE) 500 MG tablet Take 500 mg 2 (two) times daily with a meal by mouth.      No current facility-administered medications for this visit.     Review of Systems  Constitutional: positive for anorexia, fatigue and weight loss Eyes: negative Ears, nose, mouth, throat, and face: negative Respiratory: negative Cardiovascular: negative Gastrointestinal: negative Genitourinary:negative Integument/breast: negative Hematologic/lymphatic: negative Musculoskeletal:negative Neurological: negative Behavioral/Psych: negative Endocrine: negative Allergic/Immunologic: negative  Physical Exam  BTD:VVOHY, healthy, no distress, well nourished, well developed and anxious SKIN: skin color, texture, turgor are normal, no rashes or significant lesions HEAD: Normocephalic, No masses, lesions, tenderness or abnormalities EYES: normal, PERRLA, Conjunctiva are pink and non-injected EARS: External ears normal, Canals clear OROPHARYNX:no exudate, no erythema and lips, buccal mucosa, and tongue normal  NECK: supple, no adenopathy, no JVD LYMPH:  no palpable lymphadenopathy, no hepatosplenomegaly LUNGS: clear to auscultation , and palpation HEART: regular rate & rhythm, no murmurs and no gallops ABDOMEN:abdomen soft, non-tender, normal bowel  sounds and no masses or organomegaly BACK: Back symmetric, no curvature., No CVA tenderness EXTREMITIES:no joint deformities, effusion, or inflammation, no edema, no skin discoloration  NEURO: alert & oriented x 3 with fluent speech, no focal motor/sensory deficits  PERFORMANCE STATUS: ECOG 1  LABORATORY DATA: Lab Results  Component Value Date   WBC 9.5 03/30/2017   HGB 15.7 03/30/2017   HCT 46.7 03/30/2017   MCV 90.3 03/30/2017   PLT 292 03/30/2017      Chemistry   No results found for: NA, K, CL, CO2, BUN, CREATININE, GLU No results found for: CALCIUM, ALKPHOS, AST, ALT, BILITOT     RADIOGRAPHIC STUDIES: Dg Chest 1 View  Result Date: 03/16/2017 CLINICAL DATA:  As post left-sided thoracentesis with removal of 1.5 L of fluid. EXAM: CHEST 1 VIEW COMPARISON:  CT scan chest of February 24, 2017 and chest x-ray of February 20, 2017. FINDINGS: There is complete opacification of the left hemithorax. There is abrupt tapering of the caliber of the left mainstem bronchus. There is shift of the mediastinum toward the left with hyperinflation of the right lung. The right lung is clear. There is calcification in the wall of the aortic arch. The heart borders are obscured. IMPRESSION: Total opacification of the left hemithorax presumably with fluid and postobstructive atelectasis. An abrupt tapering of the left mainstem bronchus is observed. Compensatory hyperinflation of  the right lung. Thoracic aortic atherosclerosis. Electronically Signed   By: David  Martinique M.D.   On: 03/16/2017 13:31   Nm Pet Image Initial (pi) Skull Base To Thigh  Result Date: 03/23/2017 CLINICAL DATA:  Initial treatment strategy for left lung mass and pleural effusion. EXAM: NUCLEAR MEDICINE PET SKULL BASE TO THIGH TECHNIQUE: 8.4 mCi F-18 FDG was injected intravenously. Full-ring PET imaging was performed from the skull base to thigh after the radiotracer. CT data was obtained and used for attenuation correction and anatomic  localization. FASTING BLOOD GLUCOSE:  Value: 184 mg/dl COMPARISON:  Chest CT on 02/24/2017 FINDINGS: NECK:  No hypermetabolic lymph nodes or masses. CHEST: A large hypermetabolic mass is seen within the central left lung, which involves the left hilum and mediastinum. This causes obstruction of the left mainstem bronchus, and complete postobstructive collapse of the left lung. This mass is difficult to measure due to surrounding left lung collapse, but has SUV max of 12.6. Moderate left pleural effusion is seen, without evidence of hypermetabolic chest wall mass. No hypermetabolic right hilar or supraclavicular lymph nodes. Mild right lung emphysema noted, however no suspicious right lung nodules or masses identified. ABDOMEN/PELVIS: No abnormal hypermetabolic activity within the liver, pancreas, adrenal glands, or spleen. No hypermetabolic lymph nodes in the abdomen or pelvis. Moderately enlarged prostate gland is seen with relatively diffuse hypermetabolic activity, with SUV max of 7.7. This may be due to prostatitis or high-grade prostate carcinoma. Multiple calcified gallstones are seen as well as mild to moderate diffuse hepatic steatosis. Aortic atherosclerosis. Sigmoid diverticulosis, without evidence of diverticulitis. SKELETON: No focal hypermetabolic bone lesions to suggest skeletal metastasis. IMPRESSION: Large central hypermetabolic left lung mass with involvement of the left hilum and mediastinum, and complete postobstructive left lung collapse. Moderate left pleural effusion, likely malignant. Recommend correlation with cytology. No evidence of extra-thoracic metastatic disease. Moderately enlarged hypermetabolic prostate gland, which may be due to prostatitis or high-grade prostate carcinoma. Recommend correlation with PSA, and consider prostate MRI or biopsy. Other incidental findings described above. Electronically Signed   By: Earle Gell M.D.   On: 03/23/2017 12:06   Ir Thoracentesis Asp Pleural  Space W/img Guide  Result Date: 03/16/2017 INDICATION: Left-sided pulmonary mass. Left-sided pleural effusion. Request for diagnostic and therapeutic thoracentesis. EXAM: ULTRASOUND GUIDED LEFT THORACENTESIS MEDICATIONS: None. COMPLICATIONS: None immediate. Postprocedural chest x-ray negative for pneumothorax. PROCEDURE: An ultrasound guided thoracentesis was thoroughly discussed with the patient and questions answered. The benefits, risks, alternatives and complications were also discussed. The patient understands and wishes to proceed with the procedure. Written consent was obtained. Ultrasound was performed to localize and mark an adequate pocket of fluid in the left chest. The area was then prepped and draped in the normal sterile fashion. 1% Lidocaine was used for local anesthesia. Under ultrasound guidance a Safe-T-Centesis catheter was introduced. Thoracentesis was performed. The catheter was removed and a dressing applied. FINDINGS: A total of approximately 1.4 L of hazy, yellow colored fluid was removed. Samples were sent to the laboratory as requested by the clinical team. IMPRESSION: Successful ultrasound guided left thoracentesis yielding 1.4 L of pleural fluid. Read by: Ascencion Dike PA-C Electronically Signed   By: Aletta Edouard M.D.   On: 03/16/2017 13:43   Dg C-arm Bronchoscopy  Result Date: 03/25/2017 C-ARM BRONCHOSCOPY: Fluoroscopy was utilized by the requesting physician.  No radiographic interpretation.    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   PLAN: I had a lengthy discussion with the patient today about his current disease status, prognosis and treatment options. I personally and independently reviewed  the scan images and discussed the results and showed the images to the patient today. I recommended for the patient to complete the staging workup by ordering MRI of the brain to rule out brain metastasis. I explained to the patient that he has an incurable condition and all the treatment will be of palliative nature. I gave him the option of palliative care versus consideration of palliative systemic chemotherapy with carboplatin for AC of 5, paclitaxel 175 mg/M2 and Keytruda 200 mg IV every 3 weeks. I discussed with the patient the adverse effect of the chemotherapy including but not limited to alopecia, myelosuppression, nausea and vomiting, peripheral neuropathy, liver or renal dysfunction, in addition to the adverse effect of immunotherapy including but not limited to immunotherapy mediated skin rash, diarrhea, inflammation of the lung, kidney, liver, thyroid or other endocrine dysfunction. The patient is interested in proceeding with systemic chemotherapy.  He is expected to start the first cycle of this treatment on April 15, 2017. I also referred the patient to radiation oncology for consideration of short course of palliative radiotherapy to the central obstructing left upper lobe lung mass. For the recurrent left pleural effusion, I may consider referring the patient to thoracic surgery for consideration of Pleurx catheter placement if he has recurrence of his fluid. For the malnutrition and weight loss, I referred the patient to the dietitian at the cancer center for evaluation and management of his condition. We will arrange for the patient to have a chemotherapy education class before the first dose of his treatment. I would call his pharmacy with prescription for Compazine 10 mg p.o. every 6 hours as needed for nausea. The patient will come back for follow-up visit in 2 weeks for evaluation before starting the first cycle of his treatment. He was advised to call immediately if he has any  concerning symptoms in the interval. The patient voices understanding of current disease status and treatment options and is in agreement with the current care plan.  All questions were answered. The patient knows to call the clinic with any problems, questions or concerns. We can certainly see the patient much sooner if necessary.  Thank you so much for allowing me to participate in the care of Maurice Tyler. I will continue to follow up the patient with you and assist in his care.  I spent 55 minutes counseling the patient face to face. The total time spent in the appointment was 80 minutes.  Disclaimer: This note was dictated with voice recognition software. Similar sounding words can inadvertently be transcribed and may not be corrected upon review.   Eilleen Kempf March 30, 2017, 2:17 PM

## 2017-03-31 ENCOUNTER — Telehealth: Payer: Self-pay | Admitting: Internal Medicine

## 2017-03-31 ENCOUNTER — Encounter: Payer: Self-pay | Admitting: Radiation Oncology

## 2017-03-31 NOTE — Telephone Encounter (Signed)
Scheduled appt per 11/26 los - patient is aware of appt date and time and rad onc consult appt given as well.

## 2017-04-03 NOTE — Progress Notes (Signed)
Thoracic Location of Tumor / Histology: Left upper  Lobe lung mass in addition to mediastinal and left hilar adenopathy  And pleural effusion  Patient presented months ago with symptoms of: persistent cough and shortness breath   Biopsies of  (if applicable) revealed: Diagnosis 03/25/17:Dr. McQuaid, MdBronchus, biopsy, left - POORLY DIFFERENTIATED NON-SMALL CELL CARCINOMA.  Tobacco/Marijuana/Snuff/ETOH use:   Past/Anticipated interventions by cardiothoracic surgery, if any: 03/16/17 U/S guided thoracentesis with 1.4 liter drainage,   Past/Anticipated interventions by medical oncology, if any: Dr. [name]  May need pleurx  catheter placement , referral for palliative radiation to central obstructing left upper lobe lung mass ,1st chemotherapy infusion 04/15/17 paclitaxel Carboplatin every 21 days  MRI brain scheduled 04/08/17  Signs/Symptoms  Weight changes, if any: 20 lb weight loss past 3 months, difficulkty swallowing foods, eats soft foods, 25% or less appetite  Respiratory complaints, if any: Shortness breath   Hemoptysis, if any: no  Pain issues, if any:  Discomfort when lying  down in bed  SAFETY ISSUES:  Prior radiation? No  Pacemaker/ICD?  No  Is the patient on methotrexate? No Current Complaints / other details:  DM ;58 years cigarette smoking, last attempt to quit 11/21/16,no smokeless tobacco,no alcohol or drug use, wears b/l hearing aids BP 117/63   Pulse 100   Temp 97.6 F (36.4 C) (Oral)   Resp 20   Ht 5\' 10"  (1.778 m)   Wt 160 lb 3.2 oz (72.7 kg)   SpO2 99%   BMI 22.99 kg/m   Wt Readings from Last 3 Encounters:  04/09/17 160 lb 3.2 oz (72.7 kg)  04/07/17 161 lb (73 kg)  03/30/17 166 lb 11.2 oz (75.6 kg)

## 2017-04-07 ENCOUNTER — Encounter: Payer: Self-pay | Admitting: *Deleted

## 2017-04-07 ENCOUNTER — Other Ambulatory Visit: Payer: Self-pay | Admitting: Cardiothoracic Surgery

## 2017-04-07 ENCOUNTER — Other Ambulatory Visit: Payer: TRICARE For Life (TFL)

## 2017-04-07 ENCOUNTER — Other Ambulatory Visit: Payer: Self-pay

## 2017-04-07 ENCOUNTER — Encounter: Payer: Self-pay | Admitting: Cardiothoracic Surgery

## 2017-04-07 ENCOUNTER — Institutional Professional Consult (permissible substitution) (INDEPENDENT_AMBULATORY_CARE_PROVIDER_SITE_OTHER): Payer: Medicare Other | Admitting: Cardiothoracic Surgery

## 2017-04-07 VITALS — BP 100/61 | HR 100 | Ht 70.0 in | Wt 161.0 lb

## 2017-04-07 DIAGNOSIS — C3492 Malignant neoplasm of unspecified part of left bronchus or lung: Secondary | ICD-10-CM | POA: Diagnosis not present

## 2017-04-07 DIAGNOSIS — J90 Pleural effusion, not elsewhere classified: Secondary | ICD-10-CM | POA: Diagnosis not present

## 2017-04-07 DIAGNOSIS — C3490 Malignant neoplasm of unspecified part of unspecified bronchus or lung: Secondary | ICD-10-CM

## 2017-04-07 NOTE — Progress Notes (Signed)
[address]       [place] [address]             [phone]                    Christophr Pike [place] Medical Record #[id number] Date of Birth: 11-15-1940  Referring: Curt Bears, MD Primary Care: [name], MD  Chief Complaint:   No chief complaint on file.   History of Present Illness:    Maurice Tyler 76 y.o. male is seen in the office  today for possible placement of a Pleurx catheter.  Patient presents with advanced stage lung cancer with complete collapse of the left lung and probable impingement on the distal esophagus in addition. Patient has a long history of smoking and also a history of diabetes.  For the past several months he has had increasing shortness of breath and cough and weight loss.  CT scan done October 23 showed a mass in the left lung invading into the left hilum with extensive adenopathy.  No mass in the middle mediastinum which abuts the esophagus.  On November 12 thoracentesis was performed at 1.4 L of fluid was removed.  Bronchoscopy was performed by Dr. 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 The patient is unable to take solid food.  With the degree of left lung collapse that he has he notes that he is not bothered by shortness of breath much, but also likely that he has not been very active. He has no current films to evaluate the degree of pleural effusion versus left lung collapse to review on his visit today.  Current Activity/ Functional Status:  Patient is independent with mobility/ambulation, transfers, ADL's, IADL's.   Zubrod Score: At the time of surgery this patient's most appropriate activity status/level should be described as: []     0    Normal activity, no symptoms []     1    Restricted in physical strenuous activity but ambulatory, able to do out light work []     2    Ambulatory and capable of self  care, unable to do work activities, up and about               >50 % of waking hours                              []     3    Only limited self care, in bed greater than 50% of waking hours []     4    Completely disabled, no self care, confined to bed or chair []     5    Moribund   Past Medical History:  Diagnosis Date  . Diabetes mellitus without complication ([place])   . Malignant neoplasm of unspecified part of unspecified bronchus or lung ([place]) 03/30/2017    Past Surgical History:  Procedure Laterality Date  . IR THORACENTESIS ASP PLEURAL SPACE W/IMG GUIDE  03/16/2017  . VIDEO BRONCHOSCOPY Bilateral 03/25/2017   Procedure: VIDEO BRONCHOSCOPY WITH FLUORO;  Surgeon: Juanito Doom, MD;  Location: [facility];  Service: Cardiopulmonary;  Laterality: Bilateral;    Family History  Problem Relation Age of Onset  . Cancer Mother     Social History   Socioeconomic History  . Marital status: Married    Spouse name: Not on file  .  Number of children: Not on file  . Years of education: Not on file  . Highest education level: Not on file  Social Needs  . Financial resource strain: Not on file  . Food insecurity - worry: Not on file  . Food insecurity - inability: Not on file  . Transportation needs - medical: Not on file  . Transportation needs - non-medical: Not on file  Occupational History  . Not on file  Tobacco Use  . Smoking status: Former Smoker    Years: 58.00    Types: Cigarettes    Last attempt to quit: 11/21/2016    Years since quitting: 0.3  . Smokeless tobacco: Never Used  Substance and Sexual Activity  . Alcohol use: No    Frequency: Never  . Drug use: No  . Sexual activity: Not on file  Other Topics Concern  . Not on file  Social History Narrative  . Not on file    Social History   Tobacco Use  Smoking Status Former Smoker  . Years: 58.00  . Types: Cigarettes  . Last attempt to quit: 11/21/2016  . Years since quitting: 0.3  Smokeless Tobacco  Never Used    Social History   Substance and Sexual Activity  Alcohol Use No  . Frequency: Never     No Known Allergies  Current Outpatient Medications  Medication Sig Dispense Refill  . albuterol (PROVENTIL HFA;VENTOLIN HFA) 108 (90 Base) MCG/ACT inhaler Inhale 1 puff every 6 (six) hours as needed into the lungs for wheezing or shortness of breath.    . cetirizine (ZYRTEC) 10 MG tablet Take 10 mg daily by mouth. ALLERTEC    . metFORMIN (GLUCOPHAGE) 500 MG tablet Take 500 mg 2 (two) times daily with a meal by mouth.     . prochlorperazine (COMPAZINE) 10 MG tablet Take 1 tablet (10 mg total) by mouth every 6 (six) hours as needed for nausea or vomiting. 30 tablet 0   No current facility-administered medications for this visit.     Pertinent items are noted in HPI.   Review of Systems:     Cardiac Review of Systems: Y or N  Chest Pain [    ]  Resting SOB [   ] Exertional SOB  [  ]  Orthopnea [  ]   Pedal Edema [   ]    Palpitations [  ] Syncope  [  ]   Presyncope [   ]  General Review of Systems: [Y] = yes [  ]=no Constitional: recent weight change [  ];  Wt loss over the last 3 months [   ] anorexia [  ]; fatigue [  ]; nausea [  ]; night sweats [  ]; fever [  ]; or chills [  ];          Dental: poor dentition[  ]; Last Dentist visit:   Eye : blurred vision [  ]; diplopia [   ]; vision changes [  ];  Amaurosis fugax[  ]; Resp: cough [  ];  wheezing[  ];  hemoptysis[  ]; shortness of breath[  ]; paroxysmal nocturnal dyspnea[  ]; dyspnea on exertion[  ]; or orthopnea[  ];  GI:  gallstones[  ], vomiting[  ];  dysphagia[  ]; melena[  ];  hematochezia [  ]; heartburn[  ];   Hx of  Colonoscopy[  ]; GU: kidney stones [  ]; hematuria[  ];   dysuria [  ];  nocturia[  ];  history of     obstruction [  ]; urinary frequency [  ]             Skin: rash, swelling[  ];, hair loss[  ];  peripheral edema[  ];  or itching[  ]; Musculosketetal: myalgias[  ];  joint swelling[  ];  joint erythema[  ];   joint pain[  ];  back pain[  ];  Heme/Lymph: bruising[  ];  bleeding[  ];  anemia[  ];  Neuro: TIA[  ];  headaches[  ];  stroke[  ];  vertigo[  ];  seizures[  ];   paresthesias[  ];  difficulty walking[  ];  Psych:depression[  ]; anxiety[  ];  Endocrine: diabetes[  ];  thyroid dysfunction[  ];  Immunizations: Flu up to date [  ]; Pneumococcal up to date [  ];  Other:  Physical Exam: There were no vitals taken for this visit.  PHYSICAL EXAMINATION: General appearance: alert, cooperative, appears older than stated age, cachectic and no distress Head: Normocephalic, without obvious abnormality, atraumatic Neck: no adenopathy, no carotid bruit, no JVD, supple, symmetrical, trachea midline and thyroid not enlarged, symmetric, no tenderness/mass/nodules Lymph nodes: Cervical, supraclavicular, and axillary nodes normal. Resp: diminished breath sounds LLL and LUL Back: symmetric, no curvature. ROM normal. No CVA tenderness. Cardio: regular rate and rhythm, S1, S2 normal, no murmur, click, rub or gallop GI: soft, non-tender; bowel sounds normal; no masses,  no organomegaly Extremities: extremities normal, atraumatic, no cyanosis or edema and Homans sign is negative, no sign of DVT Neurologic: Grossly normal  Diagnostic Studies & Laboratory data:     Recent Radiology Findings:   Dg Chest 1 View  Result Date: 03/16/2017 CLINICAL DATA:  As post left-sided thoracentesis with removal of 1.5 L of fluid. EXAM: CHEST 1 VIEW COMPARISON:  CT scan chest of February 24, 2017 and chest x-ray of February 20, 2017. FINDINGS: There is complete opacification of the left hemithorax. There is abrupt tapering of the caliber of the left mainstem bronchus. There is shift of the mediastinum toward the left with hyperinflation of the right lung. The right lung is clear. There is calcification in the wall of the aortic arch. The heart borders are obscured. IMPRESSION: Total opacification of the left hemithorax presumably  with fluid and postobstructive atelectasis. An abrupt tapering of the left mainstem bronchus is observed. Compensatory hyperinflation of the right lung. Thoracic aortic atherosclerosis. Electronically Signed   By: David  Martinique M.D.   On: 03/16/2017 13:31   Nm Pet Image Initial (pi) Skull Base To Thigh  Result Date: 03/23/2017 CLINICAL DATA:  Initial treatment strategy for left lung mass and pleural effusion. EXAM: NUCLEAR MEDICINE PET SKULL BASE TO THIGH TECHNIQUE: 8.4 mCi F-18 FDG was injected intravenously. Full-ring PET imaging was performed from the skull base to thigh after the radiotracer. CT data was obtained and used for attenuation correction and anatomic localization. FASTING BLOOD GLUCOSE:  Value: 184 mg/dl COMPARISON:  Chest CT on 02/24/2017 FINDINGS: NECK:  No hypermetabolic lymph nodes or masses. CHEST: A large hypermetabolic mass is seen within the central left lung, which involves the left hilum and mediastinum. This causes obstruction of the left mainstem bronchus, and complete postobstructive collapse of the left lung. This mass is difficult to measure due to surrounding left lung collapse, but has SUV max of 12.6. Moderate left pleural effusion is seen, without evidence of hypermetabolic chest wall mass. No hypermetabolic right hilar or supraclavicular lymph  nodes. Mild right lung emphysema noted, however no suspicious right lung nodules or masses identified. ABDOMEN/PELVIS: No abnormal hypermetabolic activity within the liver, pancreas, adrenal glands, or spleen. No hypermetabolic lymph nodes in the abdomen or pelvis. Moderately enlarged prostate gland is seen with relatively diffuse hypermetabolic activity, with SUV max of 7.7. This may be due to prostatitis or high-grade prostate carcinoma. Multiple calcified gallstones are seen as well as mild to moderate diffuse hepatic steatosis. Aortic atherosclerosis. Sigmoid diverticulosis, without evidence of diverticulitis. SKELETON: No focal  hypermetabolic bone lesions to suggest skeletal metastasis. IMPRESSION: Large central hypermetabolic left lung mass with involvement of the left hilum and mediastinum, and complete postobstructive left lung collapse. Moderate left pleural effusion, likely malignant. Recommend correlation with cytology. No evidence of extra-thoracic metastatic disease. Moderately enlarged hypermetabolic prostate gland, which may be due to prostatitis or high-grade prostate carcinoma. Recommend correlation with PSA, and consider prostate MRI or biopsy. Other incidental findings described above. Electronically Signed   By: Earle Gell M.D.   On: 03/23/2017 12:06   Ir Thoracentesis Asp Pleural Space W/img Guide  Result Date: 03/16/2017 INDICATION: Left-sided pulmonary mass. Left-sided pleural effusion. Request for diagnostic and therapeutic thoracentesis. EXAM: ULTRASOUND GUIDED LEFT THORACENTESIS MEDICATIONS: None. COMPLICATIONS: None immediate. Postprocedural chest x-ray negative for pneumothorax. PROCEDURE: An ultrasound guided thoracentesis was thoroughly discussed with the patient and questions answered. The benefits, risks, alternatives and complications were also discussed. The patient understands and wishes to proceed with the procedure. Written consent was obtained. Ultrasound was performed to localize and mark an adequate pocket of fluid in the left chest. The area was then prepped and draped in the normal sterile fashion. 1% Lidocaine was used for local anesthesia. Under ultrasound guidance a Safe-T-Centesis catheter was introduced. Thoracentesis was performed. The catheter was removed and a dressing applied. FINDINGS: A total of approximately 1.4 L of hazy, yellow colored fluid was removed. Samples were sent to the laboratory as requested by the clinical team. IMPRESSION: Successful ultrasound guided left thoracentesis yielding 1.4 L of pleural fluid. Read by: Ascencion Dike PA-C Electronically Signed   By: Aletta Edouard M.D.   On: 03/16/2017 13:43   Dg C-arm Bronchoscopy  Result Date: 03/25/2017 C-ARM BRONCHOSCOPY: Fluoroscopy was utilized by the requesting physician.  No radiographic interpretation.     I have independently reviewed the above radiologic studies.  Recent Lab Findings: Lab Results  Component Value Date   WBC 9.5 03/30/2017   HGB 15.7 03/30/2017   HCT 46.7 03/30/2017   PLT 292 03/30/2017   GLUCOSE 131 03/30/2017   ALT 15 03/30/2017   AST 13 03/30/2017   NA 138 03/30/2017   K 5.0 03/30/2017   CREATININE 0.9 03/30/2017   BUN 17.7 03/30/2017   CO2 26 03/30/2017      Assessment / Plan:   Advanced stage non-small cell lung cancer with extensive mediastinal involvement on the left and possible esophageal involvement.  I discussed with the patient and his wife the possibility of use of a Pleurx catheter she developed increased pleural fluid that become symptomatic.  At this point I suspect his greatest limitation will be maintaining her nutritional status during radiation and chemotherapy and may require feeding tube. We will obtain a current chest x-ray tomorrow when he has the MRI of his brain He is scheduled for a CT for radiation planning later this week, I will attempt to use this to decide about placement of a Pleurx now or wait and see how much of the white out  of the left chest is collapsed lung and how much is fluid.  I have discussed with the patient and his wife the option of Pleurx catheter placement and the procedure to place it.  If we decide he needs it can be arranged quickly.       I  spent 30 minutes counseling the patient face to face and 50% or more the  time was spent in counseling and coordination of care. The total time spent in the appointment was 40 minutes.  Grace Isaac MD      [place] [place] [address] Office [phone]   [place] [phone]  04/07/2017 2:56 PM

## 2017-04-08 ENCOUNTER — Ambulatory Visit (HOSPITAL_COMMUNITY)
Admission: RE | Admit: 2017-04-08 | Discharge: 2017-04-08 | Disposition: A | Discharge status: Home / Self Care | Payer: Medicare Other | Source: Ambulatory Visit | Attending: Internal Medicine | Admitting: Internal Medicine

## 2017-04-08 ENCOUNTER — Ambulatory Visit (HOSPITAL_COMMUNITY)
Admission: RE | Admit: 2017-04-08 | Discharge: 2017-04-08 | Disposition: A | Discharge status: Home / Self Care | Payer: Medicare Other | Source: Ambulatory Visit | Attending: Cardiothoracic Surgery | Admitting: Cardiothoracic Surgery

## 2017-04-08 DIAGNOSIS — C3490 Malignant neoplasm of unspecified part of unspecified bronchus or lung: Secondary | ICD-10-CM

## 2017-04-08 DIAGNOSIS — I6782 Cerebral ischemia: Secondary | ICD-10-CM | POA: Diagnosis not present

## 2017-04-08 MED ORDER — GADOBENATE DIMEGLUMINE 529 MG/ML IV SOLN
15.0000 mL | Freq: Once | INTRAVENOUS | Status: AC | PRN
  Administered 2017-04-08: 15 mL via INTRAVENOUS

## 2017-04-09 ENCOUNTER — Other Ambulatory Visit: Payer: TRICARE For Life (TFL)

## 2017-04-09 ENCOUNTER — Ambulatory Visit
Admission: RE | Admit: 2017-04-09 | Discharge: 2017-04-09 | Disposition: A | Discharge status: Home / Self Care | Payer: Medicare Other | Source: Ambulatory Visit | Attending: Radiation Oncology | Admitting: Radiation Oncology

## 2017-04-09 ENCOUNTER — Telehealth: Payer: Self-pay | Admitting: *Deleted

## 2017-04-09 ENCOUNTER — Encounter: Payer: Self-pay | Admitting: Radiation Oncology

## 2017-04-09 VITALS — BP 117/63 | HR 100 | Temp 97.6°F | Resp 20 | Ht 70.0 in | Wt 160.2 lb

## 2017-04-09 DIAGNOSIS — Z7984 Long term (current) use of oral hypoglycemic drugs: Secondary | ICD-10-CM | POA: Insufficient documentation

## 2017-04-09 DIAGNOSIS — E119 Type 2 diabetes mellitus without complications: Secondary | ICD-10-CM | POA: Diagnosis not present

## 2017-04-09 DIAGNOSIS — Z510 Encounter for antineoplastic radiation therapy: Secondary | ICD-10-CM | POA: Diagnosis present

## 2017-04-09 DIAGNOSIS — C3412 Malignant neoplasm of upper lobe, left bronchus or lung: Secondary | ICD-10-CM

## 2017-04-09 DIAGNOSIS — Z87891 Personal history of nicotine dependence: Secondary | ICD-10-CM | POA: Insufficient documentation

## 2017-04-09 DIAGNOSIS — R590 Localized enlarged lymph nodes: Secondary | ICD-10-CM | POA: Insufficient documentation

## 2017-04-09 DIAGNOSIS — Z809 Family history of malignant neoplasm, unspecified: Secondary | ICD-10-CM | POA: Insufficient documentation

## 2017-04-09 DIAGNOSIS — J90 Pleural effusion, not elsewhere classified: Secondary | ICD-10-CM | POA: Diagnosis not present

## 2017-04-09 DIAGNOSIS — Z79899 Other long term (current) drug therapy: Secondary | ICD-10-CM | POA: Diagnosis not present

## 2017-04-09 NOTE — Addendum Note (Signed)
Encounter addended by: Doreen Beam, RN on: 04/09/2017 8:28 AM  Actions taken: Visit diagnoses modified

## 2017-04-09 NOTE — Progress Notes (Signed)
Please see the Nurse Progress Note in the MD Initial Consult Encounter for this patient. 

## 2017-04-09 NOTE — Telephone Encounter (Signed)
Called [name] RN Chemo education [phone], patient is double booked at 36 am, he is scheduled for his ct simulation and education at 10:00am,, today;  Patient and wife prefer  Tuesday at 9:30 am. Abigail Butts RN notified of preference, thanked Rn for changing his  Chemo education to Tuesday at 9:30am 8:46 AM

## 2017-04-09 NOTE — Progress Notes (Signed)
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   HISTORY OF PRESENT ILLNESS: Maurice Tyler is a 76 y.o. male seen at the request of Dr. Earlie Server for a new diagnosis of a left lung cancer. Of note, the patient has an extensive social history or smoking. He states that he quit smoking approximately four months ago after a 58-year history of cigarette usage. Around the time of mid-October 2018 he presented to his primary care physician complaining of a persistent cough and worsening shortness of breath. A CXR and subsequent CT Chest was performed at that time which was notable for a large lung mass on the left measured 4.1 x 3.3 cm, and within the  hilum and mediastinum, there was a 2.4 x 2 cm mass invading the esophagus, and 3.4 x 2.3 cm of hilar adenopathy. He had a moderate left pleural effusion and underwent thoracentesis on 03/16/17. Cytology was negative. He had a PET scan on 03/23/17 which revealed hypermetabolism of his large central left lung mass, and involvement of the hilum and mediastinum and complete postobstructive left lung collapse. His left effusion was also noted and likely malignant. He also had a hypermetabolic prostate gland which was concerning for prostatitis versus prostate cancer. He underwent bronchoscopy with Dr. Lake Bells on 03/25/17, and pathology of this left lung tumor was consistent with squamous cell carcinoma. He has met with Dr. [name] and yesterday, he completed his staging imaging with MRI brian which was negative for evidence of metastasis. He is clinically felt to be stage IV and he comes today to discuss palliative radiation with concurrent palliative  chemotherapy.      PREVIOUS RADIATION THERAPY: No   PAST MEDICAL HISTORY:  Past Medical History:  Diagnosis Date  . Diabetes mellitus without complication ([place])   . Malignant neoplasm of unspecified part of unspecified bronchus or lung ([place]) 03/30/2017       PAST SURGICAL HISTORY: Past Surgical History:  Procedure Laterality Date  . IR THORACENTESIS ASP PLEURAL SPACE W/IMG GUIDE  03/16/2017  . VIDEO BRONCHOSCOPY Bilateral 03/25/2017   Procedure: VIDEO BRONCHOSCOPY WITH FLUORO;  Surgeon: Juanito Doom, MD;  Location: [facility];  Service: Cardiopulmonary;  Laterality: Bilateral;     FAMILY HISTORY:  Family History  Problem Relation Age of Onset  . Cancer Mother      SOCIAL HISTORY:  reports that he quit smoking about 4 months ago. His smoking use included cigarettes. He quit after 58.00 years of use. he has never used smokeless tobacco. He reports that he does not drink alcohol or use drugs.   ALLERGIES: Patient has no known allergies.   MEDICATIONS:  Current Outpatient Medications  Medication Sig Dispense Refill  . albuterol (PROVENTIL HFA;VENTOLIN HFA) 108 (90 Base) MCG/ACT inhaler Inhale 1 puff every 6 (six) hours as needed into the lungs for wheezing or shortness of breath.    . metFORMIN (GLUCOPHAGE) 500 MG tablet Take 500 mg 2 (two) times daily with a meal by mouth.     . nystatin (MYCOSTATIN) 100000 UNIT/ML suspension SWISH AND SWALLOW 5 MLS PO  AC AND QHS FOR 7 DAYS  0  . ACCU-CHEK FASTCLIX LANCETS MISC     .  ACCU-CHEK GUIDE test strip USE TO CHECK BLOOD SUGAR BID  3  . cetirizine (ZYRTEC) 10 MG tablet Take 10 mg daily by mouth. ALLERTEC    . prochlorperazine (COMPAZINE) 10 MG tablet Take 1 tablet (10 mg total) by mouth every 6 (six) hours as needed for nausea or vomiting. (Patient not taking: Reported on 04/07/2017) 30 tablet 0   No current facility-administered medications for this encounter.      REVIEW OF SYSTEMS: On review of systems, the  patient reports that he is doing well overall. He denies any chest pain, shortness of breath, cough, fevers, chills, night sweats, unintended weight changes. He reports some mild orthopnea, and reports he is not ready to proceed with another thoracentesis. He denies any bowel or bladder disturbances, and denies abdominal pain, nausea or vomiting. He denies any new musculoskeletal or joint aches or pains. A complete review of systems is obtained and is otherwise negative.     PHYSICAL EXAM:  Wt Readings from Last 3 Encounters:  04/09/17 160 lb 3.2 oz (72.7 kg)  04/07/17 161 lb (73 kg)  03/30/17 166 lb 11.2 oz (75.6 kg)   Temp Readings from Last 3 Encounters:  04/09/17 97.6 F (36.4 C) (Oral)  03/30/17 97.6 F (36.4 C)  03/25/17 97.7 F (36.5 C)   BP Readings from Last 3 Encounters:  04/09/17 117/63  04/07/17 100/61  03/30/17 (!) 136/59   Pulse Readings from Last 3 Encounters:  04/09/17 100  04/07/17 100  03/30/17 (!) 114   Pain Assessment Pain Score: 2  Pain Loc: Chest(discomfort wjhen lying down)/10  In general this is a well appearing caucasian male in no acute distress. He is alert and oriented x4 and appropriate throughout the examination. HEENT reveals that the patient is normocephalic, atraumatic. EOMs are intact. PERRLA. Skin is intact without any evidence of gross lesions. Cardiovascular exam reveals a regular rate and rhythm, no clicks rubs or murmurs are auscultated. Chest is clear to auscultation on the right, with decrease in the left mid and lower fields. Lymphatic assessment is performed and does not reveal any adenopathy in the cervical, supraclavicular, axillary, or inguinal chains. Abdomen has active bowel sounds in all quadrants and is intact. The abdomen is soft, non tender, non distended. Lower extremities are negative for pretibial pitting edema, deep calf tenderness, cyanosis or clubbing.   ECOG = 1  0 - Asymptomatic (Fully active, able to carry on all  predisease activities without restriction)  1 - Symptomatic but completely ambulatory (Restricted in physically strenuous activity but ambulatory and able to carry out work of a light or sedentary nature. For example, light housework, office work)  2 - Symptomatic, <50% in bed during the day (Ambulatory and capable of all self care but unable to carry out any work activities. Up and about more than 50% of waking hours)  3 - Symptomatic, >50% in bed, but not bedbound (Capable of only limited self-care, confined to bed or chair 50% or more of waking hours)  4 - Bedbound (Completely disabled. Cannot carry on any self-care. Totally confined to bed or chair)  5 - Death   Eustace Pen MM, Creech RH, Tormey DC, et al. [phone]). "Toxicity and response criteria of the [facility]". [place] Oncol. 5 (6): 649-55    LABORATORY DATA:  Lab Results  Component Value Date   WBC 9.5 03/30/2017   HGB 15.7 03/30/2017   HCT 46.7 03/30/2017   MCV 90.3 03/30/2017   PLT 292 03/30/2017  Lab Results  Component Value Date   NA 138 03/30/2017   K 5.0 03/30/2017   CO2 26 03/30/2017   Lab Results  Component Value Date   ALT 15 03/30/2017   AST 13 03/30/2017   ALKPHOS 88 03/30/2017   BILITOT 0.54 03/30/2017      RADIOGRAPHY: Dg Chest 1 View  Result Date: 03/16/2017 CLINICAL DATA:  As post left-sided thoracentesis with removal of 1.5 L of fluid. EXAM: CHEST 1 VIEW COMPARISON:  CT scan chest of February 24, 2017 and chest x-ray of February 20, 2017. FINDINGS: There is complete opacification of the left hemithorax. There is abrupt tapering of the caliber of the left mainstem bronchus. There is shift of the mediastinum toward the left with hyperinflation of the right lung. The right lung is clear. There is calcification in the wall of the aortic arch. The heart borders are obscured. IMPRESSION: Total opacification of the left hemithorax presumably with fluid and postobstructive atelectasis.  An abrupt tapering of the left mainstem bronchus is observed. Compensatory hyperinflation of the right lung. Thoracic aortic atherosclerosis. Electronically Signed   By: David  Martinique M.D.   On: 03/16/2017 13:31   Dg Chest 2 View  Result Date: 04/08/2017 CLINICAL DATA:  Recent diagnosis of lung cancer. EXAM: CHEST  2 VIEW COMPARISON:  PET CT 03/23/2017.  Chest x-ray 03/16/2017. FINDINGS: Continued complete opacification of the left hemithorax, stable. Right lung is clear. Heart likely normal size. No acute bony abnormality. IMPRESSION: Stable complete opacification of the left hemithorax. Electronically Signed   By: Rolm Baptise M.D.   On: 04/08/2017 12:16   Mr Jeri Cos FW Contrast  Result Date: 04/08/2017 CLINICAL DATA:  Stage IV squamous cell lung cancer.  Staging. EXAM: MRI HEAD WITHOUT AND WITH CONTRAST TECHNIQUE: Multiplanar, multiecho pulse sequences of the brain and surrounding structures were obtained without and with intravenous contrast. CONTRAST:  77m MULTIHANCE GADOBENATE DIMEGLUMINE 529 MG/ML IV SOLN COMPARISON:  None. FINDINGS: Some sequences are mildly motion degraded. Brain: There is no evidence of acute infarct, intracranial hemorrhage, mass, midline shift, or extra-axial fluid collection. There is moderate cerebral and cerebellar atrophy. Minimal T2 hyperintensity in the cerebral white matter bilaterally is nonspecific but may reflect chronic small vessel ischemic disease, not greater than expected for patient's age. 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 Dilated perivascular spaces are noted in both cerebral hemispheres. No abnormal enhancement is identified. Vascular: Major intracranial vascular flow voids are preserved, with the right vertebral artery being dominant. Skull and upper cervical spine: Unremarkable bone marrow signal. Sinuses/Orbits: Unremarkable orbits.  No significant sinus disease. Other: None. IMPRESSION: 1. No evidence of intracranial metastases. 2.  Minimal chronic small vessel ischemic changes. Electronically Signed   By: ALogan BoresM.D.   On: 04/08/2017 11:35   Nm Pet Image Initial (pi) Skull Base To Thigh  Result Date: 03/23/2017 CLINICAL DATA:  Initial treatment strategy for left lung mass and pleural effusion. EXAM: NUCLEAR MEDICINE PET SKULL BASE TO THIGH TECHNIQUE: 8.4 mCi F-18 FDG was injected intravenously. Full-ring PET imaging was performed from the skull base to thigh after the radiotracer. CT data was obtained and used for attenuation correction and anatomic localization. FASTING BLOOD GLUCOSE:  Value: 184 mg/dl COMPARISON:  Chest CT on 02/24/2017 FINDINGS: NECK:  No hypermetabolic lymph nodes or masses. CHEST: A large hypermetabolic mass is seen within the central left lung, which involves the left hilum and mediastinum. This causes obstruction of the left mainstem bronchus, and complete  postobstructive collapse of the left lung. This mass is difficult to measure due to surrounding left lung collapse, but has SUV max of 12.6. Moderate left pleural effusion is seen, without evidence of hypermetabolic chest wall mass. No hypermetabolic right hilar or supraclavicular lymph nodes. Mild right lung emphysema noted, however no suspicious right lung nodules or masses identified. ABDOMEN/PELVIS: No abnormal hypermetabolic activity within the liver, pancreas, adrenal glands, or spleen. No hypermetabolic lymph nodes in the abdomen or pelvis. Moderately enlarged prostate gland is seen with relatively diffuse hypermetabolic activity, with SUV max of 7.7. This may be due to prostatitis or high-grade prostate carcinoma. Multiple calcified gallstones are seen as well as mild to moderate diffuse hepatic steatosis. Aortic atherosclerosis. Sigmoid diverticulosis, without evidence of diverticulitis. SKELETON: No focal hypermetabolic bone lesions to suggest skeletal metastasis. IMPRESSION: Large central hypermetabolic left lung mass with involvement of the left  hilum and mediastinum, and complete postobstructive left lung collapse. Moderate left pleural effusion, likely malignant. Recommend correlation with cytology. No evidence of extra-thoracic metastatic disease. Moderately enlarged hypermetabolic prostate gland, which may be due to prostatitis or high-grade prostate carcinoma. Recommend correlation with PSA, and consider prostate MRI or biopsy. Other incidental findings described above. Electronically Signed   By: Earle Gell M.D.   On: 03/23/2017 12:06   Ir Thoracentesis Asp Pleural Space W/img Guide  Result Date: 03/16/2017 INDICATION: Left-sided pulmonary mass. Left-sided pleural effusion. Request for diagnostic and therapeutic thoracentesis. EXAM: ULTRASOUND GUIDED LEFT THORACENTESIS MEDICATIONS: None. COMPLICATIONS: None immediate. Postprocedural chest x-ray negative for pneumothorax. PROCEDURE: An ultrasound guided thoracentesis was thoroughly discussed with the patient and questions answered. The benefits, risks, alternatives and complications were also discussed. The patient understands and wishes to proceed with the procedure. Written consent was obtained. Ultrasound was performed to localize and mark an adequate pocket of fluid in the left chest. The area was then prepped and draped in the normal sterile fashion. 1% Lidocaine was used for local anesthesia. Under ultrasound guidance a Safe-T-Centesis catheter was introduced. Thoracentesis was performed. The catheter was removed and a dressing applied. FINDINGS: A total of approximately 1.4 L of hazy, yellow colored fluid was removed. Samples were sent to the laboratory as requested by the clinical team. IMPRESSION: Successful ultrasound guided left thoracentesis yielding 1.4 L of pleural fluid. Read by: Ascencion Dike PA-C Electronically Signed   By: Aletta Edouard M.D.   On: 03/16/2017 13:43   Dg C-arm Bronchoscopy  Result Date: 03/25/2017 C-ARM BRONCHOSCOPY: Fluoroscopy was utilized by the  requesting physician.  No radiographic interpretation.       IMPRESSION/PLAN: 1. Stage IV, [id number], NSCLC, squamous cell carcinoma of the left lung with suspicion of malignant effusion. Dr. Lisbeth Renshaw discusses the pathology findings and reviews the nature of advanced lung disease. We discussed the risks, benefits, short, and long term effects of radiotherapy, and the patient is interested in proceeding. Dr. Lisbeth Renshaw discusses the delivery and logistics of radiotherapy and anticipates a course of 3 weeks while he will receive concurrent treatment. Written consent is obtained and placed in the chart, a copy was provided to the patient. He will simulate today, and plan for chemotherapy education early next week.  We anticipate starting radiation next week.    The above documentation reflects my direct findings during this shared patient visit. Please see the separate note by Dr. Lisbeth Renshaw on this date for the remainder of the patient's plan of care.    Carola Rhine, PAC

## 2017-04-13 ENCOUNTER — Ambulatory Visit: Payer: Medicare Other | Admitting: Radiation Oncology

## 2017-04-14 ENCOUNTER — Other Ambulatory Visit: Payer: Medicare Other

## 2017-04-14 ENCOUNTER — Ambulatory Visit: Payer: Medicare Other

## 2017-04-14 ENCOUNTER — Telehealth: Payer: Self-pay | Admitting: *Deleted

## 2017-04-14 ENCOUNTER — Ambulatory Visit: Payer: Medicare Other | Admitting: Radiation Oncology

## 2017-04-14 DIAGNOSIS — Z510 Encounter for antineoplastic radiation therapy: Secondary | ICD-10-CM | POA: Diagnosis not present

## 2017-04-14 NOTE — Telephone Encounter (Signed)
No other notes.

## 2017-04-15 ENCOUNTER — Encounter: Payer: Self-pay | Admitting: *Deleted

## 2017-04-15 ENCOUNTER — Other Ambulatory Visit (HOSPITAL_BASED_OUTPATIENT_CLINIC_OR_DEPARTMENT_OTHER): Payer: Medicare Other

## 2017-04-15 ENCOUNTER — Other Ambulatory Visit: Payer: Medicare Other

## 2017-04-15 ENCOUNTER — Ambulatory Visit: Payer: Medicare Other

## 2017-04-15 ENCOUNTER — Encounter: Payer: Self-pay | Admitting: Internal Medicine

## 2017-04-15 ENCOUNTER — Ambulatory Visit: Payer: Medicare Other | Admitting: Radiation Oncology

## 2017-04-15 ENCOUNTER — Telehealth: Payer: Self-pay | Admitting: Internal Medicine

## 2017-04-15 ENCOUNTER — Ambulatory Visit (HOSPITAL_BASED_OUTPATIENT_CLINIC_OR_DEPARTMENT_OTHER): Payer: Medicare Other | Admitting: Internal Medicine

## 2017-04-15 ENCOUNTER — Ambulatory Visit: Payer: Medicare Other | Admitting: Nutrition

## 2017-04-15 ENCOUNTER — Ambulatory Visit (HOSPITAL_BASED_OUTPATIENT_CLINIC_OR_DEPARTMENT_OTHER): Payer: Medicare Other

## 2017-04-15 VITALS — BP 122/53 | HR 90 | Temp 98.4°F | Resp 20 | Wt 158.6 lb

## 2017-04-15 VITALS — BP 112/74 | HR 73 | Temp 98.2°F | Resp 17

## 2017-04-15 DIAGNOSIS — Z5112 Encounter for antineoplastic immunotherapy: Secondary | ICD-10-CM

## 2017-04-15 DIAGNOSIS — C3412 Malignant neoplasm of upper lobe, left bronchus or lung: Secondary | ICD-10-CM | POA: Diagnosis present

## 2017-04-15 DIAGNOSIS — E119 Type 2 diabetes mellitus without complications: Secondary | ICD-10-CM

## 2017-04-15 DIAGNOSIS — C3490 Malignant neoplasm of unspecified part of unspecified bronchus or lung: Secondary | ICD-10-CM

## 2017-04-15 DIAGNOSIS — C3492 Malignant neoplasm of unspecified part of left bronchus or lung: Secondary | ICD-10-CM

## 2017-04-15 DIAGNOSIS — J90 Pleural effusion, not elsewhere classified: Secondary | ICD-10-CM

## 2017-04-15 DIAGNOSIS — Z5111 Encounter for antineoplastic chemotherapy: Secondary | ICD-10-CM

## 2017-04-15 DIAGNOSIS — R5382 Chronic fatigue, unspecified: Secondary | ICD-10-CM

## 2017-04-15 DIAGNOSIS — Z7189 Other specified counseling: Secondary | ICD-10-CM

## 2017-04-15 LAB — COMPREHENSIVE METABOLIC PANEL
ALT: 17 U/L (ref 0–55)
ANION GAP: 14 meq/L — AB (ref 3–11)
AST: 17 U/L (ref 5–34)
Albumin: 3.3 g/dL — ABNORMAL LOW (ref 3.5–5.0)
Alkaline Phosphatase: 86 U/L (ref 40–150)
BILIRUBIN TOTAL: 0.5 mg/dL (ref 0.20–1.20)
BUN: 16.8 mg/dL (ref 7.0–26.0)
CO2: 24 meq/L (ref 22–29)
CREATININE: 0.7 mg/dL (ref 0.7–1.3)
Calcium: 10.2 mg/dL (ref 8.4–10.4)
Chloride: 100 mEq/L (ref 98–109)
EGFR: >60 mL/min/{1.73_m2} (ref >60)
GLUCOSE: 105 mg/dL (ref 70–140)
Potassium: 4.1 mEq/L (ref 3.5–5.1)
Sodium: 138 mEq/L (ref 136–145)
TOTAL PROTEIN: 7 g/dL (ref 6.4–8.3)

## 2017-04-15 LAB — CBC WITH DIFFERENTIAL/PLATELET
BASO%: 0.1 % (ref 0.0–2.0)
BASOS ABS: 0 10*3/uL (ref 0.0–0.1)
EOS%: 0.2 % (ref 0.0–7.0)
Eosinophils Absolute: 0 10*3/uL (ref 0.0–0.5)
HCT: 44.3 % (ref 38.4–49.9)
HGB: 14.8 g/dL (ref 13.0–17.1)
LYMPH%: 12.3 % — AB (ref 14.0–49.0)
MCH: 30.4 pg (ref 27.2–33.4)
MCHC: 33.4 g/dL (ref 32.0–36.0)
MCV: 91 fL (ref 79.3–98.0)
MONO#: 1 10*3/uL — ABNORMAL HIGH (ref 0.1–0.9)
MONO%: 10.4 % (ref 0.0–14.0)
NEUT%: 77 % — AB (ref 39.0–75.0)
NEUTROS ABS: 7 10*3/uL — AB (ref 1.5–6.5)
Platelets: 309 10*3/uL (ref 140–400)
RBC: 4.87 10*6/uL (ref 4.20–5.82)
RDW: 12.9 % (ref 11.0–14.6)
WBC: 9.1 10*3/uL (ref 4.0–10.3)
lymph#: 1.1 10*3/uL (ref 0.9–3.3)

## 2017-04-15 LAB — TSH: TSH: 1.707 m[IU]/L (ref 0.320–4.118)

## 2017-04-15 MED ORDER — FAMOTIDINE IN NACL 20-0.9 MG/50ML-% IV SOLN
INTRAVENOUS | Status: AC
  Filled 2017-04-15: qty 50

## 2017-04-15 MED ORDER — PALONOSETRON HCL INJECTION 0.25 MG/5ML
INTRAVENOUS | Status: AC
  Filled 2017-04-15: qty 5

## 2017-04-15 MED ORDER — SODIUM CHLORIDE 0.9 % IV SOLN
20.0000 mg | Freq: Once | INTRAVENOUS | Status: AC
  Administered 2017-04-15: 20 mg via INTRAVENOUS
  Filled 2017-04-15: qty 2

## 2017-04-15 MED ORDER — SODIUM CHLORIDE 0.9 % IV SOLN
Freq: Once | INTRAVENOUS | Status: AC
  Administered 2017-04-15: 13:00:00 via INTRAVENOUS

## 2017-04-15 MED ORDER — SODIUM CHLORIDE 0.9 % IV SOLN
175.0000 mg/m2 | Freq: Once | INTRAVENOUS | Status: AC
  Administered 2017-04-15: 336 mg via INTRAVENOUS
  Filled 2017-04-15: qty 56

## 2017-04-15 MED ORDER — DIPHENHYDRAMINE HCL 50 MG/ML IJ SOLN
INTRAMUSCULAR | Status: AC
  Filled 2017-04-15: qty 1

## 2017-04-15 MED ORDER — DIPHENHYDRAMINE HCL 50 MG/ML IJ SOLN
50.0000 mg | Freq: Once | INTRAMUSCULAR | Status: AC
  Administered 2017-04-15: 50 mg via INTRAVENOUS

## 2017-04-15 MED ORDER — SODIUM CHLORIDE 0.9 % IV SOLN
456.0000 mg | Freq: Once | INTRAVENOUS | Status: AC
  Administered 2017-04-15: 460 mg via INTRAVENOUS
  Filled 2017-04-15: qty 46

## 2017-04-15 MED ORDER — SODIUM CHLORIDE 0.9 % IV SOLN
200.0000 mg | Freq: Once | INTRAVENOUS | Status: AC
  Administered 2017-04-15: 200 mg via INTRAVENOUS
  Filled 2017-04-15: qty 8

## 2017-04-15 MED ORDER — PALONOSETRON HCL INJECTION 0.25 MG/5ML
0.2500 mg | Freq: Once | INTRAVENOUS | Status: AC
  Administered 2017-04-15: 0.25 mg via INTRAVENOUS

## 2017-04-15 MED ORDER — FAMOTIDINE IN NACL 20-0.9 MG/50ML-% IV SOLN
20.0000 mg | Freq: Once | INTRAVENOUS | Status: AC
  Administered 2017-04-15: 20 mg via INTRAVENOUS

## 2017-04-15 NOTE — Progress Notes (Signed)
76 year old male diagnosed with lung cancer.  He is a patient of Dr. [name].  Past medical history includes diabetes.  Medications include Glucophage and Compazine.  Labs include albumin 3.4, glucose 131 on November 26.  Height: 5 feet 10 inches. Weight: 160.2 pounds, December 6. Usual body weight: 182.2 pounds on November 6. BMI: 22.99.  Patient status post thoracentesis on November 12, removing 1.4 L fluid. Patient tolerates 2 Glucerna daily along with very soft foods such as oatmeal and chicken noodle soup. Patient has been following a low concentrated sweets diet and has been avoiding carbohydrates. Patient endorses 22 pound weight loss.  Estimated nutrition needs: 2000-2200 calories, 90-100 grams protein, 2.2 L fluid.  Nutrition diagnosis: Inadequate oral intake related to new diagnosis of lung cancer as evidenced by 12% weight loss over 4 weeks and less than 50% energy intake for greater than 5 days.  Patient also has moderate to severe depletion of body fat and muscle mass.  Intervention: Patient was educated to liberalize carbohydrate controlled diet in order to consume higher calorie, higher protein foods and avoid further weight loss. Reviewed soft moist foods with patient. Educated patient on nausea prevention. Patient agreeable to increasing Glucerna 3 times a day I provided fact sheets and coupons.  Questions were answered.  Teach back method used.  Monitoring, evaluation, goals: Patient will tolerate increased calories and protein to minimize weight loss during treatment.  Next visit: Wednesday, January 2, during infusion.  **Disclaimer: This note was dictated with voice recognition software. Similar sounding words can inadvertently be transcribed and this note may contain transcription errors which may not have been corrected upon publication of note.**

## 2017-04-15 NOTE — Progress Notes (Signed)
Discharge instructions reviewed with patient and patient's wife, both verbalized understanding.   Patient tolerated infusions well. Patient stable upon discharge.

## 2017-04-15 NOTE — Progress Notes (Signed)
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  PRIOR THERAPY: None.  CURRENT THERAPY: Palliative systemic chemotherapy with carboplatin for AC of 5, paclitaxel 175 mg/M2 and Keytruda 200 mg IV every 3 weeks.  INTERVAL HISTORY: Maurice Tyler 76 y.o. male returns to the clinic today for follow-up visit accompanied by his wife.  The patient is feeling fine today with no specific complaints except for lack of appetite.  He is scheduled to see the dietitian later today for evaluation of his nutritional status.  He denied having any current chest pain but has shortness of breath at baseline increased with exertion with no cough or hemoptysis.  He denied having any nausea, vomiting, diarrhea or constipation.  The patient had MRI of the brain that showed no evidence of metastatic disease to the brain.  He is here today for evaluation before starting the first cycle of systemic chemotherapy.  MEDICAL HISTORY: Past Medical History:  Diagnosis Date  . Diabetes mellitus without complication ([place])   . Malignant neoplasm of unspecified part of unspecified bronchus or lung ([place]) 03/30/2017    ALLERGIES:  has No Known Allergies.  MEDICATIONS:  Current Outpatient Medications  Medication Sig Dispense Refill  . ACCU-CHEK FASTCLIX LANCETS MISC     . ACCU-CHEK GUIDE test strip USE TO CHECK BLOOD SUGAR BID  3  . albuterol (PROVENTIL HFA;VENTOLIN HFA) 108 (90 Base) MCG/ACT inhaler Inhale 1 puff every 6 (six) hours as needed into the lungs for wheezing or shortness of breath.    . cetirizine (ZYRTEC) 10 MG tablet Take 10 mg daily by  mouth. ALLERTEC    . metFORMIN (GLUCOPHAGE) 500 MG tablet Take 500 mg 2 (two) times daily with a meal by mouth.     . nystatin (MYCOSTATIN) 100000 UNIT/ML suspension SWISH AND SWALLOW 5 MLS PO  AC AND QHS FOR 7 DAYS  0  . prochlorperazine (COMPAZINE) 10 MG tablet Take 1 tablet (10 mg total) by mouth every 6 (six) hours as needed for nausea or vomiting. (Patient not taking: Reported on 04/07/2017) 30 tablet 0   No current facility-administered medications for this visit.     SURGICAL HISTORY:  Past Surgical History:  Procedure Laterality Date  . IR THORACENTESIS ASP PLEURAL SPACE W/IMG GUIDE  03/16/2017  . VIDEO BRONCHOSCOPY Bilateral 03/25/2017   Procedure: VIDEO BRONCHOSCOPY WITH FLUORO;  Surgeon: Juanito Doom, MD;  Location: [facility];  Service: Cardiopulmonary;  Laterality: Bilateral;    REVIEW OF SYSTEMS:  A comprehensive review of systems was negative except for: Constitutional: positive for fatigue and weight loss Respiratory: positive for dyspnea on exertion   PHYSICAL EXAMINATION: General appearance: alert, cooperative, fatigued and no distress Head: Normocephalic, without obvious abnormality, atraumatic Neck: no adenopathy, no JVD, supple, symmetrical, trachea midline and thyroid not enlarged, symmetric, no tenderness/mass/nodules Lymph nodes: Cervical, supraclavicular, and axillary nodes normal. Resp: clear to auscultation bilaterally Back: symmetric, no curvature. ROM normal. No CVA tenderness. Cardio: regular rate and rhythm, S1, S2 normal, no murmur, click, rub or gallop GI: soft, non-tender; bowel sounds normal; no masses,  no organomegaly Extremities: extremities normal, atraumatic, no cyanosis or edema  ECOG PERFORMANCE STATUS: 1 -  Symptomatic but completely ambulatory  Blood pressure (!) 122/53, pulse 90, temperature 98.4 F (36.9 C), temperature source Oral, resp. rate 20, weight 158 lb 9.6 oz (71.9 kg), SpO2 97 %.  LABORATORY DATA: Lab Results  Component  Value Date   WBC 9.1 04/15/2017   HGB 14.8 04/15/2017   HCT 44.3 04/15/2017   MCV 91.0 04/15/2017   PLT 309 04/15/2017      Chemistry      Component Value Date/Time   NA 138 04/15/2017 0922   K 4.1 04/15/2017 0922   CO2 24 04/15/2017 0922   BUN 16.8 04/15/2017 0922   CREATININE 0.7 04/15/2017 0922      Component Value Date/Time   CALCIUM 10.2 04/15/2017 0922   ALKPHOS 86 04/15/2017 0922   AST 17 04/15/2017 0922   ALT 17 04/15/2017 0922   BILITOT 0.50 04/15/2017 0922       RADIOGRAPHIC STUDIES: Dg Chest 1 View  Result Date: 03/16/2017 CLINICAL DATA:  As post left-sided thoracentesis with removal of 1.5 L of fluid. EXAM: CHEST 1 VIEW COMPARISON:  CT scan chest of February 24, 2017 and chest x-ray of February 20, 2017. FINDINGS: There is complete opacification of the left hemithorax. There is abrupt tapering of the caliber of the left mainstem bronchus. There is shift of the mediastinum toward the left with hyperinflation of the right lung. The right lung is clear. There is calcification in the wall of the aortic arch. The heart borders are obscured. IMPRESSION: Total opacification of the left hemithorax presumably with fluid and postobstructive atelectasis. An abrupt tapering of the left mainstem bronchus is observed. Compensatory hyperinflation of the right lung. Thoracic aortic atherosclerosis. Electronically Signed   By: David  Martinique M.D.   On: 03/16/2017 13:31   Dg Chest 2 View  Result Date: 04/08/2017 CLINICAL DATA:  Recent diagnosis of lung cancer. EXAM: CHEST  2 VIEW COMPARISON:  PET CT 03/23/2017.  Chest x-ray 03/16/2017. FINDINGS: Continued complete opacification of the left hemithorax, stable. Right lung is clear. Heart likely normal size. No acute bony abnormality. IMPRESSION: Stable complete opacification of the left hemithorax. Electronically Signed   By: Rolm Baptise M.D.   On: 04/08/2017 12:16   Mr Jeri Cos YQ Contrast  Result Date: 04/08/2017 CLINICAL DATA:  Stage  IV squamous cell lung cancer.  Staging. EXAM: MRI HEAD WITHOUT AND WITH CONTRAST TECHNIQUE: Multiplanar, multiecho pulse sequences of the brain and surrounding structures were obtained without and with intravenous contrast. CONTRAST:  69mL MULTIHANCE GADOBENATE DIMEGLUMINE 529 MG/ML IV SOLN COMPARISON:  None. FINDINGS: Some sequences are mildly motion degraded. Brain: There is no evidence of acute infarct, intracranial hemorrhage, mass, midline shift, or extra-axial fluid collection. There is moderate cerebral and cerebellar atrophy. Minimal T2 hyperintensity in the cerebral white matter bilaterally is nonspecific but may reflect chronic small vessel ischemic disease, not greater than expected for patient's age. 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 Dilated perivascular spaces are noted in both cerebral hemispheres. No abnormal enhancement is identified. Vascular: Major intracranial vascular flow voids are preserved, with the right vertebral artery being dominant. Skull and upper cervical spine: Unremarkable bone marrow signal. Sinuses/Orbits: Unremarkable orbits.  No significant sinus disease. Other: None. IMPRESSION: 1. No evidence of intracranial metastases. 2. Minimal chronic small vessel ischemic changes. Electronically Signed   By: Logan Bores M.D.   On: 04/08/2017 11:35   Nm Pet Image Initial (pi) Skull Base To Thigh  Result Date: 03/23/2017 CLINICAL DATA:  Initial treatment  strategy for left lung mass and pleural effusion. EXAM: NUCLEAR MEDICINE PET SKULL BASE TO THIGH TECHNIQUE: 8.4 mCi F-18 FDG was injected intravenously. Full-ring PET imaging was performed from the skull base to thigh after the radiotracer. CT data was obtained and used for attenuation correction and anatomic localization. FASTING BLOOD GLUCOSE:  Value: 184 mg/dl COMPARISON:  Chest CT on 02/24/2017 FINDINGS: NECK:  No hypermetabolic lymph nodes or masses. CHEST: A large hypermetabolic mass is seen within the  central left lung, which involves the left hilum and mediastinum. This causes obstruction of the left mainstem bronchus, and complete postobstructive collapse of the left lung. This mass is difficult to measure due to surrounding left lung collapse, but has SUV max of 12.6. Moderate left pleural effusion is seen, without evidence of hypermetabolic chest wall mass. No hypermetabolic right hilar or supraclavicular lymph nodes. Mild right lung emphysema noted, however no suspicious right lung nodules or masses identified. ABDOMEN/PELVIS: No abnormal hypermetabolic activity within the liver, pancreas, adrenal glands, or spleen. No hypermetabolic lymph nodes in the abdomen or pelvis. Moderately enlarged prostate gland is seen with relatively diffuse hypermetabolic activity, with SUV max of 7.7. This may be due to prostatitis or high-grade prostate carcinoma. Multiple calcified gallstones are seen as well as mild to moderate diffuse hepatic steatosis. Aortic atherosclerosis. Sigmoid diverticulosis, without evidence of diverticulitis. SKELETON: No focal hypermetabolic bone lesions to suggest skeletal metastasis. IMPRESSION: Large central hypermetabolic left lung mass with involvement of the left hilum and mediastinum, and complete postobstructive left lung collapse. Moderate left pleural effusion, likely malignant. Recommend correlation with cytology. No evidence of extra-thoracic metastatic disease. Moderately enlarged hypermetabolic prostate gland, which may be due to prostatitis or high-grade prostate carcinoma. Recommend correlation with PSA, and consider prostate MRI or biopsy. Other incidental findings described above. Electronically Signed   By: Earle Gell M.D.   On: 03/23/2017 12:06   Ir Thoracentesis Asp Pleural Space W/img Guide  Result Date: 03/16/2017 INDICATION: Left-sided pulmonary mass. Left-sided pleural effusion. Request for diagnostic and therapeutic thoracentesis. EXAM: ULTRASOUND GUIDED LEFT  THORACENTESIS MEDICATIONS: None. COMPLICATIONS: None immediate. Postprocedural chest x-ray negative for pneumothorax. PROCEDURE: An ultrasound guided thoracentesis was thoroughly discussed with the patient and questions answered. The benefits, risks, alternatives and complications were also discussed. The patient understands and wishes to proceed with the procedure. Written consent was obtained. Ultrasound was performed to localize and mark an adequate pocket of fluid in the left chest. The area was then prepped and draped in the normal sterile fashion. 1% Lidocaine was used for local anesthesia. Under ultrasound guidance a Safe-T-Centesis catheter was introduced. Thoracentesis was performed. The catheter was removed and a dressing applied. FINDINGS: A total of approximately 1.4 L of hazy, yellow colored fluid was removed. Samples were sent to the laboratory as requested by the clinical team. IMPRESSION: Successful ultrasound guided left thoracentesis yielding 1.4 L of pleural fluid. Read by: Ascencion Dike PA-C Electronically Signed   By: Aletta Edouard M.D.   On: 03/16/2017 13:43   Dg C-arm Bronchoscopy  Result Date: 03/25/2017 C-ARM BRONCHOSCOPY: Fluoroscopy was utilized by the requesting physician.  No radiographic interpretation.    ASSESSMENT AND PLAN: This is a very pleasant 76 years old white male with a stage IV non-small cell lung cancer, squamous cell carcinoma. The patient is here today to start the first cycle of systemic chemotherapy with carboplatin, paclitaxel and Keytruda. He is feeling fine and a recommended for him to proceed with the treatment as planned. For the malnutrition, he  is scheduled to see the dietitian at the cancer center today. I would see the patient back for follow-up visit in 3 weeks for evaluation before starting cycle #2. He was advised to call immediately if he has any concerning symptoms in the interval. The patient voices understanding of current disease status  and treatment options and is in agreement with the current care plan. All questions were answered. The patient knows to call the clinic with any problems, questions or concerns. We can certainly see the patient much sooner if necessary.  I spent 10 minutes counseling the patient face to face. The total time spent in the appointment was 15 minutes.  Disclaimer: This note was dictated with voice recognition software. Similar sounding words can inadvertently be transcribed and may not be corrected upon review.

## 2017-04-15 NOTE — Telephone Encounter (Signed)
Gave avs and calendar for February and March 2019

## 2017-04-15 NOTE — Patient Instructions (Addendum)
[facility] Discharge Instructions for Patients Receiving Chemotherapy  Today you received the following chemotherapy agents paclitaxel (Taxol), carboplatin, pembrolizumab (Keytruda).  To help prevent nausea and vomiting after your treatment, we encourage you to take your nausea medication as directed by doctor.   If you develop nausea and vomiting that is not controlled by your nausea medication, call the clinic.   BELOW ARE SYMPTOMS THAT SHOULD BE REPORTED IMMEDIATELY:  *FEVER GREATER THAN 100.5 F  *CHILLS WITH OR WITHOUT FEVER  NAUSEA AND VOMITING THAT IS NOT CONTROLLED WITH YOUR NAUSEA MEDICATION  *UNUSUAL SHORTNESS OF BREATH  *UNUSUAL BRUISING OR BLEEDING  TENDERNESS IN MOUTH AND THROAT WITH OR WITHOUT PRESENCE OF ULCERS  *URINARY PROBLEMS  *BOWEL PROBLEMS  UNUSUAL RASH Items with * indicate a potential emergency and should be followed up as soon as possible.  Feel free to call the clinic should you have any questions or concerns. The clinic phone number is [phone].  Please show the [facility] at check-in to the Emergency Department and triage nurse.  Pembrolizumab injection What is this medicine? PEMBROLIZUMAB (pem broe liz ue mab) is a monoclonal antibody. It is used to treat melanoma, head and neck cancer, Hodgkin lymphoma, non-small cell lung cancer, urothelial cancer, stomach cancer, and cancers that have a certain genetic condition. This medicine may be used for other purposes; ask your health care provider or pharmacist if you have questions. COMMON BRAND NAME(S): Keytruda What should I tell my health care provider before I take this medicine? They need to know if you have any of these conditions: -diabetes -immune system problems -inflammatory bowel disease -liver disease -lung or breathing disease -lupus -organ transplant -an unusual or allergic reaction to pembrolizumab, other medicines, foods, dyes, or preservatives -pregnant  or trying to get pregnant -breast-feeding How should I use this medicine? This medicine is for infusion into a vein. It is given by a health care professional in a hospital or clinic setting. A special MedGuide will be given to you before each treatment. Be sure to read this information carefully each time. Talk to your pediatrician regarding the use of this medicine in children. While this drug may be prescribed for selected conditions, precautions do apply. Overdosage: If you think you have taken too much of this medicine contact a poison control center or emergency room at once. NOTE: This medicine is only for you. Do not share this medicine with others. What if I miss a dose? It is important not to miss your dose. Call your doctor or health care professional if you are unable to keep an appointment. What may interact with this medicine? Interactions have not been studied. Give your health care provider a list of all the medicines, herbs, non-prescription drugs, or dietary supplements you use. Also tell them if you smoke, drink alcohol, or use illegal drugs. Some items may interact with your medicine. This list may not describe all possible interactions. Give your health care provider a list of all the medicines, herbs, non-prescription drugs, or dietary supplements you use. Also tell them if you smoke, drink alcohol, or use illegal drugs. Some items may interact with your medicine. What should I watch for while using this medicine? Your condition will be monitored carefully while you are receiving this medicine. You may need blood work done while you are taking this medicine. Do not become pregnant while taking this medicine or for 4 months after stopping it. Women should inform their doctor if they wish to  become pregnant or think they might be pregnant. There is a potential for serious side effects to an unborn child. Talk to your health care professional or pharmacist for more information. Do  not breast-feed an infant while taking this medicine or for 4 months after the last dose. What side effects may I notice from receiving this medicine? Side effects that you should report to your doctor or health care professional as soon as possible: -allergic reactions like skin rash, itching or hives, swelling of the face, lips, or tongue -bloody or black, tarry -breathing problems -changes in vision -chest pain -chills -constipation -cough -dizziness or feeling faint or lightheaded -fast or irregular heartbeat -fever -flushing -hair loss -low blood counts - this medicine may decrease the number of white blood cells, red blood cells and platelets. You may be at increased risk for infections and bleeding. -muscle pain -muscle weakness -persistent headache -signs and symptoms of high blood sugar such as dizziness; dry mouth; dry skin; fruity breath; nausea; stomach pain; increased hunger or thirst; increased urination -signs and symptoms of kidney injury like trouble passing urine or change in the amount of urine -signs and symptoms of liver injury like dark urine, light-colored stools, loss of appetite, nausea, right upper belly pain, yellowing of the eyes or skin -stomach pain -sweating -weight loss Side effects that usually do not require medical attention (report to your doctor or health care professional if they continue or are bothersome): -decreased appetite -diarrhea -tiredness This list may not describe all possible side effects. Call your doctor for medical advice about side effects. You may report side effects to FDA at 1-800-FDA-1088. Where should I keep my medicine? This drug is given in a hospital or clinic and will not be stored at home. NOTE: This sheet is a summary. It may not cover all possible information. If you have questions about this medicine, talk to your doctor, pharmacist, or health care provider.  2018 Elsevier/Gold Standard (2016-01-29  12:29:36)   Paclitaxel injection What is this medicine? PACLITAXEL (PAK li TAX el) is a chemotherapy drug. It targets fast dividing cells, like cancer cells, and causes these cells to die. This medicine is used to treat ovarian cancer, breast cancer, and other cancers. This medicine may be used for other purposes; ask your health care provider or pharmacist if you have questions. COMMON BRAND NAME(S): Onxol, Taxol What should I tell my health care provider before I take this medicine? They need to know if you have any of these conditions: -blood disorders -irregular heartbeat -infection (especially a virus infection such as chickenpox, cold sores, or herpes) -liver disease -previous or ongoing radiation therapy -an unusual or allergic reaction to paclitaxel, alcohol, polyoxyethylated castor oil, other chemotherapy agents, other medicines, foods, dyes, or preservatives -pregnant or trying to get pregnant -breast-feeding How should I use this medicine? This drug is given as an infusion into a vein. It is administered in a hospital or clinic by a specially trained health care professional. Talk to your pediatrician regarding the use of this medicine in children. Special care may be needed. Overdosage: If you think you have taken too much of this medicine contact a poison control center or emergency room at once. NOTE: This medicine is only for you. Do not share this medicine with others. What if I miss a dose? It is important not to miss your dose. Call your doctor or health care professional if you are unable to keep an appointment. What may interact with this medicine? Do  not take this medicine with any of the following medications: -disulfiram -metronidazole This medicine may also interact with the following medications: -cyclosporine -diazepam -ketoconazole -medicines to increase blood counts like filgrastim, pegfilgrastim, sargramostim -other chemotherapy drugs like cisplatin,  doxorubicin, epirubicin, etoposide, teniposide, vincristine -quinidine -testosterone -vaccines -verapamil Talk to your doctor or health care professional before taking any of these medicines: -acetaminophen -aspirin -ibuprofen -ketoprofen -naproxen This list may not describe all possible interactions. Give your health care provider a list of all the medicines, herbs, non-prescription drugs, or dietary supplements you use. Also tell them if you smoke, drink alcohol, or use illegal drugs. Some items may interact with your medicine. What should I watch for while using this medicine? Your condition will be monitored carefully while you are receiving this medicine. You will need important blood work done while you are taking this medicine. This medicine can cause serious allergic reactions. To reduce your risk you will need to take other medicine(s) before treatment with this medicine. If you experience allergic reactions like skin rash, itching or hives, swelling of the face, lips, or tongue, tell your doctor or health care professional right away. In some cases, you may be given additional medicines to help with side effects. Follow all directions for their use. This drug may make you feel generally unwell. This is not uncommon, as chemotherapy can affect healthy cells as well as cancer cells. Report any side effects. Continue your course of treatment even though you feel ill unless your doctor tells you to stop. Call your doctor or health care professional for advice if you get a fever, chills or sore throat, or other symptoms of a cold or flu. Do not treat yourself. This drug decreases your body's ability to fight infections. Try to avoid being around people who are sick. This medicine may increase your risk to bruise or bleed. Call your doctor or health care professional if you notice any unusual bleeding. Be careful brushing and flossing your teeth or using a toothpick because you may get an  infection or bleed more easily. If you have any dental work done, tell your dentist you are receiving this medicine. Avoid taking products that contain aspirin, acetaminophen, ibuprofen, naproxen, or ketoprofen unless instructed by your doctor. These medicines may hide a fever. Do not become pregnant while taking this medicine. Women should inform their doctor if they wish to become pregnant or think they might be pregnant. There is a potential for serious side effects to an unborn child. Talk to your health care professional or pharmacist for more information. Do not breast-feed an infant while taking this medicine. Men are advised not to father a child while receiving this medicine. This product may contain alcohol. Ask your pharmacist or healthcare provider if this medicine contains alcohol. Be sure to tell all healthcare providers you are taking this medicine. Certain medicines, like metronidazole and disulfiram, can cause an unpleasant reaction when taken with alcohol. The reaction includes flushing, headache, nausea, vomiting, sweating, and increased thirst. The reaction can last from 30 minutes to several hours. What side effects may I notice from receiving this medicine? Side effects that you should report to your doctor or health care professional as soon as possible: -allergic reactions like skin rash, itching or hives, swelling of the face, lips, or tongue -low blood counts - This drug may decrease the number of white blood cells, red blood cells and platelets. You may be at increased risk for infections and bleeding. -signs of infection - fever  or chills, cough, sore throat, pain or difficulty passing urine -signs of decreased platelets or bleeding - bruising, pinpoint red spots on the skin, black, tarry stools, nosebleeds -signs of decreased red blood cells - unusually weak or tired, fainting spells, lightheadedness -breathing problems -chest pain -high or low blood pressure -mouth  sores -nausea and vomiting -pain, swelling, redness or irritation at the injection site -pain, tingling, numbness in the hands or feet -slow or irregular heartbeat -swelling of the ankle, feet, hands Side effects that usually do not require medical attention (report to your doctor or health care professional if they continue or are bothersome): -bone pain -complete hair loss including hair on your head, underarms, pubic hair, eyebrows, and eyelashes -changes in the color of fingernails -diarrhea -loosening of the fingernails -loss of appetite -muscle or joint pain -red flush to skin -sweating This list may not describe all possible side effects. Call your doctor for medical advice about side effects. You may report side effects to FDA at 1-800-FDA-1088. Where should I keep my medicine? This drug is given in a hospital or clinic and will not be stored at home. NOTE: This sheet is a summary. It may not cover all possible information. If you have questions about this medicine, talk to your doctor, pharmacist, or health care provider.  2018 Elsevier/Gold Standard (2015-02-20 19:58:00)  Carboplatin injection What is this medicine? CARBOPLATIN (KAR boe pla tin) is a chemotherapy drug. It targets fast dividing cells, like cancer cells, and causes these cells to die. This medicine is used to treat ovarian cancer and many other cancers. This medicine may be used for other purposes; ask your health care provider or pharmacist if you have questions. COMMON BRAND NAME(S): Paraplatin What should I tell my health care provider before I take this medicine? They need to know if you have any of these conditions: -blood disorders -hearing problems -kidney disease -recent or ongoing radiation therapy -an unusual or allergic reaction to carboplatin, cisplatin, other chemotherapy, other medicines, foods, dyes, or preservatives -pregnant or trying to get pregnant -breast-feeding How should I use this  medicine? This drug is usually given as an infusion into a vein. It is administered in a hospital or clinic by a specially trained health care professional. Talk to your pediatrician regarding the use of this medicine in children. Special care may be needed. Overdosage: If you think you have taken too much of this medicine contact a poison control center or emergency room at once. NOTE: This medicine is only for you. Do not share this medicine with others. What if I miss a dose? It is important not to miss a dose. Call your doctor or health care professional if you are unable to keep an appointment. What may interact with this medicine? -medicines for seizures -medicines to increase blood counts like filgrastim, pegfilgrastim, sargramostim -some antibiotics like amikacin, gentamicin, neomycin, streptomycin, tobramycin -vaccines Talk to your doctor or health care professional before taking any of these medicines: -acetaminophen -aspirin -ibuprofen -ketoprofen -naproxen This list may not describe all possible interactions. Give your health care provider a list of all the medicines, herbs, non-prescription drugs, or dietary supplements you use. Also tell them if you smoke, drink alcohol, or use illegal drugs. Some items may interact with your medicine. What should I watch for while using this medicine? Your condition will be monitored carefully while you are receiving this medicine. You will need important blood work done while you are taking this medicine. This drug may make you feel  generally unwell. This is not uncommon, as chemotherapy can affect healthy cells as well as cancer cells. Report any side effects. Continue your course of treatment even though you feel ill unless your doctor tells you to stop. In some cases, you may be given additional medicines to help with side effects. Follow all directions for their use. Call your doctor or health care professional for advice if you get a  fever, chills or sore throat, or other symptoms of a cold or flu. Do not treat yourself. This drug decreases your body's ability to fight infections. Try to avoid being around people who are sick. This medicine may increase your risk to bruise or bleed. Call your doctor or health care professional if you notice any unusual bleeding. Be careful brushing and flossing your teeth or using a toothpick because you may get an infection or bleed more easily. If you have any dental work done, tell your dentist you are receiving this medicine. Avoid taking products that contain aspirin, acetaminophen, ibuprofen, naproxen, or ketoprofen unless instructed by your doctor. These medicines may hide a fever. Do not become pregnant while taking this medicine. Women should inform their doctor if they wish to become pregnant or think they might be pregnant. There is a potential for serious side effects to an unborn child. Talk to your health care professional or pharmacist for more information. Do not breast-feed an infant while taking this medicine. What side effects may I notice from receiving this medicine? Side effects that you should report to your doctor or health care professional as soon as possible: -allergic reactions like skin rash, itching or hives, swelling of the face, lips, or tongue -signs of infection - fever or chills, cough, sore throat, pain or difficulty passing urine -signs of decreased platelets or bleeding - bruising, pinpoint red spots on the skin, black, tarry stools, nosebleeds -signs of decreased red blood cells - unusually weak or tired, fainting spells, lightheadedness -breathing problems -changes in hearing -changes in vision -chest pain -high blood pressure -low blood counts - This drug may decrease the number of white blood cells, red blood cells and platelets. You may be at increased risk for infections and bleeding. -nausea and vomiting -pain, swelling, redness or irritation at the  injection site -pain, tingling, numbness in the hands or feet -problems with balance, talking, walking -trouble passing urine or change in the amount of urine Side effects that usually do not require medical attention (report to your doctor or health care professional if they continue or are bothersome): -hair loss -loss of appetite -metallic taste in the mouth or changes in taste This list may not describe all possible side effects. Call your doctor for medical advice about side effects. You may report side effects to FDA at 1-800-FDA-1088. Where should I keep my medicine? This drug is given in a hospital or clinic and will not be stored at home. NOTE: This sheet is a summary. It may not cover all possible information. If you have questions about this medicine, talk to your doctor, pharmacist, or health care provider.  2018 Elsevier/Gold Standard (2007-07-27 14:38:05)

## 2017-04-16 ENCOUNTER — Ambulatory Visit: Payer: Medicare Other

## 2017-04-16 ENCOUNTER — Ambulatory Visit
Admission: RE | Admit: 2017-04-16 | Discharge: 2017-04-16 | Disposition: A | Discharge status: Home / Self Care | Payer: Medicare Other | Source: Ambulatory Visit | Attending: Radiation Oncology | Admitting: Radiation Oncology

## 2017-04-16 ENCOUNTER — Telehealth: Payer: Self-pay | Admitting: Medical Oncology

## 2017-04-16 ENCOUNTER — Ambulatory Visit: Payer: Medicare Other | Admitting: Radiation Oncology

## 2017-04-16 DIAGNOSIS — Z510 Encounter for antineoplastic radiation therapy: Secondary | ICD-10-CM | POA: Diagnosis not present

## 2017-04-16 NOTE — Telephone Encounter (Signed)
Left message to return call about chemo followup .

## 2017-04-17 ENCOUNTER — Ambulatory Visit: Payer: Medicare Other

## 2017-04-17 ENCOUNTER — Ambulatory Visit
Admission: RE | Admit: 2017-04-17 | Discharge: 2017-04-17 | Disposition: A | Discharge status: Home / Self Care | Payer: Medicare Other | Source: Ambulatory Visit | Attending: Radiation Oncology | Admitting: Radiation Oncology

## 2017-04-17 ENCOUNTER — Ambulatory Visit (HOSPITAL_BASED_OUTPATIENT_CLINIC_OR_DEPARTMENT_OTHER): Payer: Medicare Other

## 2017-04-17 VITALS — BP 115/72 | HR 76 | Temp 98.1°F | Resp 16

## 2017-04-17 DIAGNOSIS — C3490 Malignant neoplasm of unspecified part of unspecified bronchus or lung: Secondary | ICD-10-CM

## 2017-04-17 DIAGNOSIS — C3412 Malignant neoplasm of upper lobe, left bronchus or lung: Secondary | ICD-10-CM

## 2017-04-17 DIAGNOSIS — Z5189 Encounter for other specified aftercare: Secondary | ICD-10-CM | POA: Diagnosis not present

## 2017-04-17 DIAGNOSIS — Z510 Encounter for antineoplastic radiation therapy: Secondary | ICD-10-CM | POA: Diagnosis not present

## 2017-04-17 DIAGNOSIS — C3492 Malignant neoplasm of unspecified part of left bronchus or lung: Secondary | ICD-10-CM

## 2017-04-17 MED ORDER — PEGFILGRASTIM INJECTION 6 MG/0.6ML ~~LOC~~
6.0000 mg | PREFILLED_SYRINGE | Freq: Once | SUBCUTANEOUS | Status: AC
  Administered 2017-04-17: 6 mg via SUBCUTANEOUS

## 2017-04-17 NOTE — Progress Notes (Signed)
  Radiation Oncology         [phone]) [phone] ________________________________  Name: Maurice Tyler MRN: [id number]  Date: 04/09/2017  DOB: Jul 02, 1940  SIMULATION AND TREATMENT PLANNING NOTE  DIAGNOSIS:     ICD-10-CM   1. 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.  Identity was confirmed.  All relevant records and images related to the planned course of therapy were reviewed.   Written consent to proceed with treatment was confirmed which was freely given after reviewing the details related to the planned course of therapy had been reviewed with the patient.  Then, the patient was set-up in a stable reproducible  supine position for radiation therapy.  CT images were obtained.  Surface markings were placed.    Medically necessary complex treatment device(s) for immobilization:  Customized vac-lock bag.   The CT images were loaded into the planning software.  Then the target and avoidance structures were contoured.  Treatment planning then occurred.  The radiation prescription was entered and confirmed.  A total of 6 complex treatment devices were fabricated which relate to the designed radiation treatment fields; including 3 reduced fields to improve dose homogeneity. Each of these customized fields/ complex treatment devices will be used on a daily basis during the radiation course. I have requested : 3D Simulation  I have requested a DVH of the following structures: target volume, spinal cord, lungs, heart.   The patient will undergo daily image guidance to ensure accurate localization of the target, and adequate minimize dose to the normal surrounding structures in close proximity to the target.   PLAN:  The patient will receive 37.5 Gy in 15 fractions.   Special treatment procedure The patient will also receive concurrent chemotherapy during the treatment. The patient may  therefore experience increased toxicity or side effects and the patient will be monitored for such problems. This may require extra lab work as necessary. This therefore constitutes a special treatment procedure.   ________________________________   Jodelle Gross, MD, PhD

## 2017-04-20 ENCOUNTER — Ambulatory Visit
Admission: RE | Admit: 2017-04-20 | Discharge: 2017-04-20 | Disposition: A | Discharge status: Home / Self Care | Payer: Medicare Other | Source: Ambulatory Visit | Attending: Radiation Oncology | Admitting: Radiation Oncology

## 2017-04-20 ENCOUNTER — Ambulatory Visit: Payer: Medicare Other

## 2017-04-20 DIAGNOSIS — Z510 Encounter for antineoplastic radiation therapy: Secondary | ICD-10-CM | POA: Diagnosis not present

## 2017-04-21 ENCOUNTER — Ambulatory Visit: Payer: Medicare Other

## 2017-04-21 ENCOUNTER — Ambulatory Visit
Admission: RE | Admit: 2017-04-21 | Discharge: 2017-04-21 | Disposition: A | Discharge status: Home / Self Care | Payer: Medicare Other | Source: Ambulatory Visit | Attending: Radiation Oncology | Admitting: Radiation Oncology

## 2017-04-21 DIAGNOSIS — Z510 Encounter for antineoplastic radiation therapy: Secondary | ICD-10-CM | POA: Diagnosis not present

## 2017-04-22 ENCOUNTER — Other Ambulatory Visit (HOSPITAL_BASED_OUTPATIENT_CLINIC_OR_DEPARTMENT_OTHER): Payer: Medicare Other

## 2017-04-22 ENCOUNTER — Ambulatory Visit: Payer: Medicare Other

## 2017-04-22 ENCOUNTER — Other Ambulatory Visit: Payer: TRICARE For Life (TFL)

## 2017-04-22 ENCOUNTER — Ambulatory Visit
Admission: RE | Admit: 2017-04-22 | Discharge: 2017-04-22 | Disposition: A | Discharge status: Home / Self Care | Payer: Medicare Other | Source: Ambulatory Visit | Attending: Radiation Oncology | Admitting: Radiation Oncology

## 2017-04-22 DIAGNOSIS — C3412 Malignant neoplasm of upper lobe, left bronchus or lung: Secondary | ICD-10-CM | POA: Diagnosis not present

## 2017-04-22 DIAGNOSIS — C3492 Malignant neoplasm of unspecified part of left bronchus or lung: Secondary | ICD-10-CM

## 2017-04-22 LAB — COMPREHENSIVE METABOLIC PANEL
ALBUMIN: 3.3 g/dL — AB (ref 3.5–5.0)
ALK PHOS: 110 U/L (ref 40–150)
ALT: 22 U/L (ref 0–55)
AST: 18 U/L (ref 5–34)
Anion Gap: 13 mEq/L — ABNORMAL HIGH (ref 3–11)
BILIRUBIN TOTAL: 0.58 mg/dL (ref 0.20–1.20)
BUN: 16.4 mg/dL (ref 7.0–26.0)
CALCIUM: 9.6 mg/dL (ref 8.4–10.4)
CO2: 28 mEq/L (ref 22–29)
Chloride: 99 mEq/L (ref 98–109)
Creatinine: 0.7 mg/dL (ref 0.7–1.3)
GLUCOSE: 147 mg/dL — AB (ref 70–140)
POTASSIUM: 3.9 meq/L (ref 3.5–5.1)
Sodium: 140 mEq/L (ref 136–145)
TOTAL PROTEIN: 6.6 g/dL (ref 6.4–8.3)

## 2017-04-22 LAB — CBC WITH DIFFERENTIAL/PLATELET
BASO%: 0.5 % (ref 0.0–2.0)
BASOS ABS: 0.1 10*3/uL (ref 0.0–0.1)
EOS ABS: 0 10*3/uL (ref 0.0–0.5)
EOS%: 0.3 % (ref 0.0–7.0)
HEMATOCRIT: 44.6 % (ref 38.4–49.9)
HEMOGLOBIN: 14.4 g/dL (ref 13.0–17.1)
LYMPH#: 0.6 10*3/uL — AB (ref 0.9–3.3)
LYMPH%: 4.6 % — ABNORMAL LOW (ref 14.0–49.0)
MCH: 29.6 pg (ref 27.2–33.4)
MCHC: 32.3 g/dL (ref 32.0–36.0)
MCV: 91.5 fL (ref 79.3–98.0)
MONO#: 0.1 10*3/uL (ref 0.1–0.9)
MONO%: 1.1 % (ref 0.0–14.0)
NEUT%: 93.5 % — ABNORMAL HIGH (ref 39.0–75.0)
NEUTROS ABS: 12.3 10*3/uL — AB (ref 1.5–6.5)
Platelets: 207 10*3/uL (ref 140–400)
RBC: 4.88 10*6/uL (ref 4.20–5.82)
RDW: 13.1 % (ref 11.0–14.6)
WBC: 13.1 10*3/uL — AB (ref 4.0–10.3)

## 2017-04-23 ENCOUNTER — Ambulatory Visit
Admission: RE | Admit: 2017-04-23 | Discharge: 2017-04-23 | Disposition: A | Discharge status: Home / Self Care | Payer: Medicare Other | Source: Ambulatory Visit | Attending: Radiation Oncology | Admitting: Radiation Oncology

## 2017-04-23 ENCOUNTER — Ambulatory Visit: Payer: Medicare Other

## 2017-04-24 ENCOUNTER — Inpatient Hospital Stay (HOSPITAL_COMMUNITY)
Admission: EM | Admit: 2017-04-24 | Discharge: 2017-05-20 | DRG: 871 | Disposition: A | Discharge status: Skilled Nursing Facility | Payer: Medicare Other | Attending: Internal Medicine | Admitting: Internal Medicine

## 2017-04-24 ENCOUNTER — Ambulatory Visit: Payer: Medicare Other

## 2017-04-24 ENCOUNTER — Other Ambulatory Visit: Payer: Self-pay | Admitting: Medical Oncology

## 2017-04-24 ENCOUNTER — Other Ambulatory Visit: Payer: Self-pay

## 2017-04-24 ENCOUNTER — Emergency Department (HOSPITAL_COMMUNITY): Payer: Medicare Other

## 2017-04-24 ENCOUNTER — Other Ambulatory Visit: Payer: Self-pay | Admitting: Radiation Oncology

## 2017-04-24 ENCOUNTER — Telehealth: Payer: Self-pay | Admitting: *Deleted

## 2017-04-24 ENCOUNTER — Encounter (HOSPITAL_COMMUNITY): Payer: Self-pay

## 2017-04-24 DIAGNOSIS — A409 Streptococcal sepsis, unspecified: Secondary | ICD-10-CM | POA: Diagnosis not present

## 2017-04-24 DIAGNOSIS — L89891 Pressure ulcer of other site, stage 1: Secondary | ICD-10-CM | POA: Diagnosis present

## 2017-04-24 DIAGNOSIS — R197 Diarrhea, unspecified: Secondary | ICD-10-CM | POA: Diagnosis present

## 2017-04-24 DIAGNOSIS — A419 Sepsis, unspecified organism: Secondary | ICD-10-CM | POA: Diagnosis present

## 2017-04-24 DIAGNOSIS — Z7189 Other specified counseling: Secondary | ICD-10-CM

## 2017-04-24 DIAGNOSIS — N179 Acute kidney failure, unspecified: Secondary | ICD-10-CM

## 2017-04-24 DIAGNOSIS — J90 Pleural effusion, not elsewhere classified: Secondary | ICD-10-CM

## 2017-04-24 DIAGNOSIS — Z809 Family history of malignant neoplasm, unspecified: Secondary | ICD-10-CM

## 2017-04-24 DIAGNOSIS — L8990 Pressure ulcer of unspecified site, unspecified stage: Secondary | ICD-10-CM | POA: Diagnosis present

## 2017-04-24 DIAGNOSIS — J869 Pyothorax without fistula: Secondary | ICD-10-CM

## 2017-04-24 DIAGNOSIS — Z9689 Presence of other specified functional implants: Secondary | ICD-10-CM

## 2017-04-24 DIAGNOSIS — E875 Hyperkalemia: Secondary | ICD-10-CM | POA: Diagnosis present

## 2017-04-24 DIAGNOSIS — J9621 Acute and chronic respiratory failure with hypoxia: Secondary | ICD-10-CM

## 2017-04-24 DIAGNOSIS — E778 Other disorders of glycoprotein metabolism: Secondary | ICD-10-CM | POA: Diagnosis present

## 2017-04-24 DIAGNOSIS — Z79899 Other long term (current) drug therapy: Secondary | ICD-10-CM

## 2017-04-24 DIAGNOSIS — J154 Pneumonia due to other streptococci: Secondary | ICD-10-CM

## 2017-04-24 DIAGNOSIS — J8410 Pulmonary fibrosis, unspecified: Secondary | ICD-10-CM | POA: Diagnosis present

## 2017-04-24 DIAGNOSIS — J948 Other specified pleural conditions: Secondary | ICD-10-CM

## 2017-04-24 DIAGNOSIS — R627 Adult failure to thrive: Secondary | ICD-10-CM | POA: Diagnosis present

## 2017-04-24 DIAGNOSIS — E119 Type 2 diabetes mellitus without complications: Secondary | ICD-10-CM

## 2017-04-24 DIAGNOSIS — I482 Chronic atrial fibrillation, unspecified: Secondary | ICD-10-CM

## 2017-04-24 DIAGNOSIS — J910 Malignant pleural effusion: Secondary | ICD-10-CM | POA: Diagnosis present

## 2017-04-24 DIAGNOSIS — J441 Chronic obstructive pulmonary disease with (acute) exacerbation: Secondary | ICD-10-CM | POA: Diagnosis present

## 2017-04-24 DIAGNOSIS — B953 Streptococcus pneumoniae as the cause of diseases classified elsewhere: Secondary | ICD-10-CM | POA: Diagnosis present

## 2017-04-24 DIAGNOSIS — R0602 Shortness of breath: Secondary | ICD-10-CM | POA: Diagnosis not present

## 2017-04-24 DIAGNOSIS — I471 Supraventricular tachycardia: Secondary | ICD-10-CM | POA: Diagnosis not present

## 2017-04-24 DIAGNOSIS — Z6821 Body mass index (BMI) 21.0-21.9, adult: Secondary | ICD-10-CM

## 2017-04-24 DIAGNOSIS — E43 Unspecified severe protein-calorie malnutrition: Secondary | ICD-10-CM | POA: Diagnosis present

## 2017-04-24 DIAGNOSIS — E1165 Type 2 diabetes mellitus with hyperglycemia: Secondary | ICD-10-CM | POA: Diagnosis present

## 2017-04-24 DIAGNOSIS — Z9889 Other specified postprocedural states: Secondary | ICD-10-CM

## 2017-04-24 DIAGNOSIS — Y95 Nosocomial condition: Secondary | ICD-10-CM | POA: Diagnosis present

## 2017-04-24 DIAGNOSIS — J9622 Acute and chronic respiratory failure with hypercapnia: Secondary | ICD-10-CM | POA: Diagnosis present

## 2017-04-24 DIAGNOSIS — E872 Acidosis: Secondary | ICD-10-CM | POA: Diagnosis present

## 2017-04-24 DIAGNOSIS — I4891 Unspecified atrial fibrillation: Secondary | ICD-10-CM

## 2017-04-24 DIAGNOSIS — Z09 Encounter for follow-up examination after completed treatment for conditions other than malignant neoplasm: Secondary | ICD-10-CM

## 2017-04-24 DIAGNOSIS — J9602 Acute respiratory failure with hypercapnia: Secondary | ICD-10-CM

## 2017-04-24 DIAGNOSIS — T380X5A Adverse effect of glucocorticoids and synthetic analogues, initial encounter: Secondary | ICD-10-CM | POA: Diagnosis present

## 2017-04-24 DIAGNOSIS — Z87891 Personal history of nicotine dependence: Secondary | ICD-10-CM

## 2017-04-24 DIAGNOSIS — R630 Anorexia: Secondary | ICD-10-CM

## 2017-04-24 DIAGNOSIS — R000 Tachycardia, unspecified: Secondary | ICD-10-CM

## 2017-04-24 DIAGNOSIS — J9601 Acute respiratory failure with hypoxia: Secondary | ICD-10-CM | POA: Diagnosis present

## 2017-04-24 DIAGNOSIS — R6520 Severe sepsis without septic shock: Secondary | ICD-10-CM | POA: Diagnosis present

## 2017-04-24 DIAGNOSIS — E11649 Type 2 diabetes mellitus with hypoglycemia without coma: Secondary | ICD-10-CM | POA: Diagnosis not present

## 2017-04-24 DIAGNOSIS — J440 Chronic obstructive pulmonary disease with acute lower respiratory infection: Secondary | ICD-10-CM | POA: Diagnosis present

## 2017-04-24 DIAGNOSIS — E876 Hypokalemia: Secondary | ICD-10-CM | POA: Diagnosis present

## 2017-04-24 DIAGNOSIS — J189 Pneumonia, unspecified organism: Secondary | ICD-10-CM | POA: Diagnosis present

## 2017-04-24 DIAGNOSIS — R339 Retention of urine, unspecified: Secondary | ICD-10-CM

## 2017-04-24 DIAGNOSIS — L89152 Pressure ulcer of sacral region, stage 2: Secondary | ICD-10-CM | POA: Diagnosis present

## 2017-04-24 DIAGNOSIS — Z515 Encounter for palliative care: Secondary | ICD-10-CM

## 2017-04-24 DIAGNOSIS — C3492 Malignant neoplasm of unspecified part of left bronchus or lung: Secondary | ICD-10-CM | POA: Diagnosis present

## 2017-04-24 DIAGNOSIS — E11622 Type 2 diabetes mellitus with other skin ulcer: Secondary | ICD-10-CM | POA: Diagnosis present

## 2017-04-24 DIAGNOSIS — E440 Moderate protein-calorie malnutrition: Secondary | ICD-10-CM | POA: Diagnosis present

## 2017-04-24 DIAGNOSIS — Z66 Do not resuscitate: Secondary | ICD-10-CM | POA: Diagnosis not present

## 2017-04-24 DIAGNOSIS — R0902 Hypoxemia: Secondary | ICD-10-CM

## 2017-04-24 LAB — CBC WITH DIFFERENTIAL/PLATELET
BAND NEUTROPHILS: 3 %
BASOS ABS: 0 10*3/uL (ref 0.0–0.1)
BLASTS: 0 %
Basophils Relative: 0 %
EOS ABS: 0 10*3/uL (ref 0.0–0.7)
Eosinophils Relative: 0 %
HEMATOCRIT: 48.5 % (ref 39.0–52.0)
HEMOGLOBIN: 16.4 g/dL (ref 13.0–17.0)
LYMPHS PCT: 1 %
Lymphs Abs: 0.3 10*3/uL — ABNORMAL LOW (ref 0.7–4.0)
MCH: 31.5 pg (ref 26.0–34.0)
MCHC: 33.8 g/dL (ref 30.0–36.0)
MCV: 93.3 fL (ref 78.0–100.0)
MONOS PCT: 5 %
Metamyelocytes Relative: 1 %
Monocytes Absolute: 1.4 10*3/uL — ABNORMAL HIGH (ref 0.1–1.0)
Myelocytes: 1 %
Neutro Abs: 25.8 10*3/uL — ABNORMAL HIGH (ref 1.7–7.7)
Neutrophils Relative %: 89 %
OTHER: 0 %
PROMYELOCYTES ABS: 0 %
Platelets: 242 10*3/uL (ref 150–400)
RBC: 5.2 MIL/uL (ref 4.22–5.81)
RDW: 13.3 % (ref 11.5–15.5)
WBC: 27.5 10*3/uL — AB (ref 4.0–10.5)
nRBC: 0 /100 WBC

## 2017-04-24 LAB — COMPREHENSIVE METABOLIC PANEL
ALBUMIN: 3.6 g/dL (ref 3.5–5.0)
ALT: 22 U/L (ref 17–63)
ANION GAP: 17 — AB (ref 5–15)
AST: 26 U/L (ref 15–41)
Alkaline Phosphatase: 132 U/L — ABNORMAL HIGH (ref 38–126)
BILIRUBIN TOTAL: 0.9 mg/dL (ref 0.3–1.2)
BUN: 26 mg/dL — ABNORMAL HIGH (ref 6–20)
CALCIUM: 9.4 mg/dL (ref 8.9–10.3)
CO2: 22 mmol/L (ref 22–32)
Chloride: 99 mmol/L — ABNORMAL LOW (ref 101–111)
Creatinine, Ser: 0.74 mg/dL (ref 0.61–1.24)
GLUCOSE: 234 mg/dL — AB (ref 65–99)
POTASSIUM: 4.1 mmol/L (ref 3.5–5.1)
Sodium: 138 mmol/L (ref 135–145)
TOTAL PROTEIN: 7.4 g/dL (ref 6.5–8.1)

## 2017-04-24 LAB — I-STAT CG4 LACTIC ACID, ED: LACTIC ACID, VENOUS: 2.72 mmol/L — AB (ref 0.5–1.9)

## 2017-04-24 LAB — BRAIN NATRIURETIC PEPTIDE: B Natriuretic Peptide: 440.6 pg/mL — ABNORMAL HIGH (ref 0.0–100.0)

## 2017-04-24 LAB — I-STAT TROPONIN, ED: TROPONIN I, POC: 0.05 ng/mL (ref 0.00–0.08)

## 2017-04-24 MED ORDER — ALBUTEROL SULFATE (2.5 MG/3ML) 0.083% IN NEBU: 5.0000 mg | INHALATION_SOLUTION | Freq: Once | RESPIRATORY_TRACT | Status: DC

## 2017-04-24 MED ORDER — LEVALBUTEROL HCL 0.63 MG/3ML IN NEBU
0.6300 mg | INHALATION_SOLUTION | Freq: Four times a day (QID) | RESPIRATORY_TRACT | Status: DC
  Administered 2017-04-25 – 2017-05-09 (×51): 0.63 mg via RESPIRATORY_TRACT
  Filled 2017-04-24 (×53): qty 3

## 2017-04-24 MED ORDER — IOPAMIDOL (ISOVUE-370) INJECTION 76%
INTRAVENOUS | Status: AC
  Administered 2017-04-24: 100 mL via INTRAVENOUS
  Filled 2017-04-24: qty 100

## 2017-04-24 MED ORDER — PROCHLORPERAZINE MALEATE 10 MG PO TABS: 10.0000 mg | ORAL_TABLET | Freq: Four times a day (QID) | ORAL | 0 refills | Status: AC | PRN

## 2017-04-24 MED ORDER — METHYLPREDNISOLONE 4 MG PO TBPK: ORAL_TABLET | ORAL | 0 refills | Status: DC

## 2017-04-24 NOTE — ED Provider Notes (Addendum)
[name] DEPT Provider Note   CSN: [id number] Arrival date & time: 04/24/17  2002     History   Chief Complaint Chief Complaint  Patient presents with  . Shortness of Breath    HPI Maurice Tyler is a 76 y.o. male.  HPI  Patient has known lung cancer.  Patient is getting palliative chemotherapy.  He has got significantly more short of breath over the past 2 days.  He chronically has cough but seems somewhat worse.  No fever.  No swelling of the lower extremities.  No mental status change. Past Medical History:  Diagnosis Date  . Diabetes mellitus without complication ([place])   . Malignant neoplasm of unspecified part of unspecified bronchus or lung ([place]) 03/30/2017    Patient Active Problem List   Diagnosis Date Noted  . Acute respiratory failure with hypoxia ([place]) 04/25/2017  . Diabetes mellitus type 2 in nonobese ([place]) 04/25/2017  . CAP (community acquired pneumonia) 04/25/2017  . 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 Mass of left lung 03/05/2017    Past Surgical History:  Procedure Laterality Date  . IR THORACENTESIS ASP PLEURAL SPACE W/IMG GUIDE  03/16/2017  . VIDEO BRONCHOSCOPY Bilateral 03/25/2017   Procedure: VIDEO BRONCHOSCOPY WITH FLUORO;  Surgeon: Juanito Doom, MD;  Location: [facility];  Service: Cardiopulmonary;  Laterality: Bilateral;       Home Medications    Prior to Admission medications   Medication Sig Start Date End Date Taking? Authorizing Provider  acetaminophen (TYLENOL) 500 MG tablet Take 1,000 mg by mouth every 6 (six) hours as needed for moderate pain.   Yes [provider]  albuterol (PROVENTIL HFA;VENTOLIN HFA) 108 (90 Base) MCG/ACT inhaler Inhale 1 puff every 6 (six) hours as needed into the lungs for wheezing or shortness of breath.   Yes [provider]  loperamide (IMODIUM  A-D) 2 MG tablet Take 2 mg by mouth 3 (three) times daily as needed for diarrhea or loose stools.   Yes [provider]  metFORMIN (GLUCOPHAGE) 500 MG tablet Take 500 mg 2 (two) times daily with a meal by mouth.    Yes [provider]  methylPREDNISolone (MEDROL DOSEPAK) 4 MG TBPK tablet Take per package instructions Patient taking differently: as directed. Take 6 tablets on Day 1, Take 5 tablets on Day 2, Take 4 tablets on Day 3, Take 3 tablets on Day 4, Take 2 tablets on Day 5 and take 1 tablet on Day 6. 04/24/17  Yes Curt Bears, MD  prochlorperazine (COMPAZINE) 10 MG tablet Take 1 tablet (10 mg total) by mouth every 6 (six) hours as needed for nausea or vomiting. 04/24/17  Yes Kyung Rudd, MD  ACCU-CHEK FASTCLIX LANCETS [place]  03/19/17   [provider]  ACCU-CHEK GUIDE test strip USE TO CHECK BLOOD SUGAR BID 03/23/17   [provider]  cetirizine (ZYRTEC) 10 MG tablet Take 10 mg daily by mouth. ALLERTEC    [provider]  nystatin (MYCOSTATIN) 100000 UNIT/ML suspension SWISH AND SWALLOW 5 MLS PO  AC AND QHS FOR 7 DAYS 03/31/17   [provider]    Family History Family History  Problem Relation Age of Onset  . Cancer Mother     Social History Social History   Tobacco Use  . Smoking status: Former Smoker    Years: 58.00    Types: Cigarettes  Last attempt to quit: 11/21/2016    Years since quitting: 0.4  . Smokeless tobacco: Never Used  Substance Use Topics  . Alcohol use: No    Frequency: Never  . Drug use: No     Allergies   Patient has no known allergies.   Review of Systems Review of Systems 10 Systems reviewed and are negative for acute change except as noted in the HPI.  Physical Exam Updated Vital Signs BP 127/60 (BP Location: Right Arm)   Pulse 91   Temp 97.9 F (36.6 C) (Oral)   Resp (!) 33   SpO2 97%   Physical Exam  Constitutional: He is oriented to person, place, and time.  Patient has mild  to moderate increased work of breathing.  Mental status is alert.  He is thin and deconditioned.  HENT:  Head: Normocephalic and atraumatic.  Cardiovascular:  Tachycardia.  No gross rub murmur gallop.  Pulmonary/Chest:  Moderate work of breathing.  Significantly decreased breath sounds over left lung fields.  Abdominal: Soft. He exhibits no distension. There is no tenderness.  Musculoskeletal: Normal range of motion. He exhibits no edema or tenderness.  Neurological: He is alert and oriented to person, place, and time. No cranial nerve deficit. He exhibits normal muscle tone. Coordination normal.  Skin: Skin is warm.  Psychiatric: He has a normal mood and affect.     ED Treatments / Results  Labs (all labs ordered are listed, but only abnormal results are displayed) Labs Reviewed  COMPREHENSIVE METABOLIC PANEL - Abnormal; Notable for the following components:      Result Value   Chloride 99 (*)    Glucose, Bld 234 (*)    BUN 26 (*)    Alkaline Phosphatase 132 (*)    Anion gap 17 (*)    All other components within normal limits  CBC WITH DIFFERENTIAL/PLATELET - Abnormal; Notable for the following components:   WBC 27.5 (*)    Neutro Abs 25.8 (*)    Lymphs Abs 0.3 (*)    Monocytes Absolute 1.4 (*)    All other components within normal limits  BRAIN NATRIURETIC PEPTIDE - Abnormal; Notable for the following components:   B Natriuretic Peptide 440.6 (*)    All other components within normal limits  I-STAT CG4 LACTIC ACID, ED - Abnormal; Notable for the following components:   Lactic Acid, Venous 2.72 (*)    All other components within normal limits  CULTURE, BLOOD (ROUTINE X 2)  CULTURE, BLOOD (ROUTINE X 2)  URINALYSIS, ROUTINE W REFLEX MICROSCOPIC  I-STAT TROPONIN, ED  I-STAT CG4 LACTIC ACID, ED    EKG  EKG Interpretation None       Radiology Dg Chest 2 View  Result Date: 04/24/2017 CLINICAL DATA:  Chronic shortness of breath, productive cough EXAM: CHEST  2 VIEW  COMPARISON:  04/08/2017 FINDINGS: Large left perihilar mass. Moderate left pleural effusion which appears partially loculated. Right lung is clear. Stable cardiomediastinal silhouette. Thoracic aortic atherosclerosis. No acute osseous abnormality. IMPRESSION: Left perihilar soft tissue mass consistent with known malignancy. Moderate left pleural effusion improved compared with 04/08/2017. Electronically Signed   By: Kathreen Devoid   On: 04/24/2017 21:43   Ct Angio Chest Pe W/cm &/or Wo Cm  Result Date: 04/24/2017 CLINICAL DATA:  Acute onset of shortness of breath, cough and generalized weakness. Known lung cancer, status post radiation therapy. EXAM: CT ANGIOGRAPHY CHEST WITH CONTRAST TECHNIQUE: Multidetector CT imaging of the chest was performed using the standard protocol during bolus  administration of intravenous contrast. Multiplanar CT image reconstructions and MIPs were obtained to evaluate the vascular anatomy. CONTRAST:  151mL ISOVUE-370 IOPAMIDOL (ISOVUE-370) INJECTION 76% COMPARISON:  Chest radiograph performed earlier today at 9:11 p.m., and PET/CT performed 03/23/2017 FINDINGS: Cardiovascular: There is no definite evidence of pulmonary embolus. Evaluation for pulmonary embolus is suboptimal due to motion artifact and in areas of airspace consolidation. The heart is normal in size. Scattered calcification is noted along the aortic arch. The great vessels are grossly unremarkable in appearance, aside from mild mural thrombus along the left subclavian artery. Mediastinum/Nodes: A large mass is again noted along the left side of the mediastinum, completely effacing the left mainstem bronchus and markedly compressing the left main pulmonary artery and its branches. The mass is difficult to fully characterize due to the phase of contrast enhancement, but measures at least 6.5 cm size. Trace pericardial fluid remains within normal limits. No additional mediastinal lymphadenopathy is seen. The thyroid gland  is unremarkable. No axillary lymphadenopathy is appreciated. Lungs/Pleura: A moderate to large left-sided pleural effusion is noted, improved from the prior study. Diffuse airspace opacity is noted within the left upper and lower lobes, compatible with pneumonia. Underlying central cavitary lung mass is noted, with air-fluid levels and nodular extension into the left lung apex. Patchy airspace opacities within the right middle lobe are also concerning for pneumonia. Underlying emphysema is noted at the right upper lobe. No pneumothorax is seen. Upper Abdomen: The visualized portions of the liver and spleen are unremarkable. The visualized portions of the pancreas, adrenal glands and kidneys are within normal limits. Musculoskeletal: No acute osseous abnormalities are identified. The visualized musculature is unremarkable in appearance. Review of the MIP images confirms the above findings. IMPRESSION: 1. No definite evidence of pulmonary embolus. 2. Diffuse airspace opacities within the left upper and lower lung lobes, and mild patchy airspace opacities within the right middle lobe, compatible with multifocal pneumonia. 3. Central cavitary lung mass noted at the left lung, with air-fluid levels and nodular extension into the left lung apex. Associated large mass along the left side of the mediastinum, completely effacing the left mainstem bronchus and markedly compressing the left main pulmonary artery and its branches. This reflects the patient's known lung cancer. 4. Moderate to large left-sided pleural effusion is improved from the prior study. 5. Underlying emphysema noted at the right upper lobe. Electronically Signed   By: Garald Balding M.D.   On: 04/24/2017 23:49    Procedures Procedures (including critical care time) CRITICAL CARE Performed by: Si Gaul   Total critical care time: 20 minutes  Critical care time was exclusive of separately billable procedures and treating other  patients.  Critical care was necessary to treat or prevent imminent or life-threatening deterioration.  Critical care was time spent personally by me on the following activities: development of treatment plan with patient and/or surrogate as well as nursing, discussions with consultants, evaluation of patient's response to treatment, examination of patient, obtaining history from patient or surrogate, ordering and performing treatments and interventions, ordering and review of laboratory studies, ordering and review of radiographic studies, pulse oximetry and re-evaluation of patient's condition. Medications Ordered in ED Medications  levalbuterol (XOPENEX) nebulizer solution 0.63 mg (not administered)  ceFEPIme (MAXIPIME) 2 g in dextrose 5 % 50 mL IVPB (not administered)  vancomycin (VANCOCIN) IVPB 1000 mg/200 mL premix (not administered)  iopamidol (ISOVUE-370) 76 % injection (100 mLs Intravenous Contrast Given 04/24/17 2310)     Initial Impression / Assessment and  Plan / ED Course  I have reviewed the triage vital signs and the nursing notes.  Pertinent labs & imaging results that were available during my care of the patient were reviewed by me and considered in my medical decision making (see chart for details).      Consult: Dr. Hal Hope for admission.  We will proceed with CT PE study.  Final Clinical Impressions(s) / ED Diagnoses   Final diagnoses:  Hypoxia  Malignant neoplasm of left lung, unspecified part of lung ([place])  Tachycardia  Acute on chronic respiratory failure with hypoxia ([place])  HCAP (healthcare-associated pneumonia)   Patient has developed significant increased and difficulty breathing.  He does have known malignancy.  Patient however presents with hypoxia which is new.  We will proceed with CT PE study to further delineate.  Patient does have significant leukocytosis.  He however does not have fever or chest pain.  Will await CT PE results to determine next  treatment plan.  Have reviewed with Dr. Hal Hope who is also evaluating at this time for admission and additional diagnostic and treatment plan. ED Discharge Orders    None       Charlesetta Shanks, MD 04/24/17 2313    Charlesetta Shanks, MD 04/24/17 Sarita Haver    Charlesetta Shanks, MD 04/25/17 0005

## 2017-04-24 NOTE — ED Triage Notes (Signed)
Pt c/o chronic shortness of breath, worsening over the past several days. Pt not on o2 at home, initial sats 85%, pt's family reports congested cough with frothy sputum.

## 2017-04-24 NOTE — ED Notes (Signed)
Bed: WA03 Expected date:  Expected time:  Means of arrival:  Comments: triage

## 2017-04-24 NOTE — ED Notes (Signed)
Pt reported feeling SOB following his xray. Pt was assisted into an upright sitting position. Pt's oxygen was maintained at 98% on nonrebreather.

## 2017-04-24 NOTE — Telephone Encounter (Signed)
Called patient who had cancelled rad tx today, wife stated he was up all night with diarrhea, what should she do?,Very weak, cancelled radiatio treatment  To his chest today, MD informed , suggested he start taking imodium OTC , she then  said patient also coughing up a lot of junk, and needs refill on compazine too,  she had called Dr. [name] office , and waiting for them to call back, Dr. Lisbeth Renshaw will refill his compazine he will e-script it over, wife thanked this RN and stated she would go get imodium  For her husband 10:19 AM

## 2017-04-24 NOTE — Telephone Encounter (Signed)
Diarrhea,cough/no appetite - pt not hungry lost 10 pounds in past 10 days. Pt will get Imodium /compazine per Dr Lisbeth Renshaw  Orders. Per Dr [name] I instructed wife to start pt on Delysm for cough and medrol dose pack called in to stimulate appetite.

## 2017-04-24 NOTE — ED Notes (Signed)
Pt O2 increased to 4L [place], pts sats remain at 86%. MD notified.

## 2017-04-24 NOTE — ED Notes (Signed)
Pt aware a urine sample is needed. Family remains with patient.

## 2017-04-25 ENCOUNTER — Other Ambulatory Visit: Payer: Self-pay

## 2017-04-25 ENCOUNTER — Encounter (HOSPITAL_COMMUNITY): Payer: Self-pay | Admitting: Internal Medicine

## 2017-04-25 ENCOUNTER — Inpatient Hospital Stay (HOSPITAL_COMMUNITY): Payer: Medicare Other

## 2017-04-25 DIAGNOSIS — J9602 Acute respiratory failure with hypercapnia: Secondary | ICD-10-CM

## 2017-04-25 DIAGNOSIS — J154 Pneumonia due to other streptococci: Secondary | ICD-10-CM | POA: Diagnosis present

## 2017-04-25 DIAGNOSIS — J441 Chronic obstructive pulmonary disease with (acute) exacerbation: Secondary | ICD-10-CM | POA: Diagnosis present

## 2017-04-25 DIAGNOSIS — C3412 Malignant neoplasm of upper lobe, left bronchus or lung: Secondary | ICD-10-CM | POA: Diagnosis not present

## 2017-04-25 DIAGNOSIS — R627 Adult failure to thrive: Secondary | ICD-10-CM | POA: Diagnosis not present

## 2017-04-25 DIAGNOSIS — R0902 Hypoxemia: Secondary | ICD-10-CM

## 2017-04-25 DIAGNOSIS — J910 Malignant pleural effusion: Secondary | ICD-10-CM | POA: Diagnosis present

## 2017-04-25 DIAGNOSIS — N179 Acute kidney failure, unspecified: Secondary | ICD-10-CM | POA: Diagnosis present

## 2017-04-25 DIAGNOSIS — Z9889 Other specified postprocedural states: Secondary | ICD-10-CM | POA: Diagnosis not present

## 2017-04-25 DIAGNOSIS — R0600 Dyspnea, unspecified: Secondary | ICD-10-CM | POA: Diagnosis not present

## 2017-04-25 DIAGNOSIS — E440 Moderate protein-calorie malnutrition: Secondary | ICD-10-CM | POA: Diagnosis not present

## 2017-04-25 DIAGNOSIS — J948 Other specified pleural conditions: Secondary | ICD-10-CM | POA: Diagnosis present

## 2017-04-25 DIAGNOSIS — E119 Type 2 diabetes mellitus without complications: Secondary | ICD-10-CM

## 2017-04-25 DIAGNOSIS — E8729 Other acidosis: Secondary | ICD-10-CM | POA: Insufficient documentation

## 2017-04-25 DIAGNOSIS — I471 Supraventricular tachycardia: Secondary | ICD-10-CM | POA: Diagnosis not present

## 2017-04-25 DIAGNOSIS — B953 Streptococcus pneumoniae as the cause of diseases classified elsewhere: Secondary | ICD-10-CM | POA: Diagnosis present

## 2017-04-25 DIAGNOSIS — Z66 Do not resuscitate: Secondary | ICD-10-CM | POA: Diagnosis not present

## 2017-04-25 DIAGNOSIS — J440 Chronic obstructive pulmonary disease with acute lower respiratory infection: Secondary | ICD-10-CM | POA: Diagnosis present

## 2017-04-25 DIAGNOSIS — Z7189 Other specified counseling: Secondary | ICD-10-CM | POA: Diagnosis not present

## 2017-04-25 DIAGNOSIS — J9601 Acute respiratory failure with hypoxia: Secondary | ICD-10-CM | POA: Diagnosis not present

## 2017-04-25 DIAGNOSIS — Z978 Presence of other specified devices: Secondary | ICD-10-CM | POA: Diagnosis not present

## 2017-04-25 DIAGNOSIS — E11622 Type 2 diabetes mellitus with other skin ulcer: Secondary | ICD-10-CM | POA: Diagnosis present

## 2017-04-25 DIAGNOSIS — J90 Pleural effusion, not elsewhere classified: Secondary | ICD-10-CM | POA: Diagnosis not present

## 2017-04-25 DIAGNOSIS — A419 Sepsis, unspecified organism: Secondary | ICD-10-CM | POA: Diagnosis not present

## 2017-04-25 DIAGNOSIS — R0602 Shortness of breath: Secondary | ICD-10-CM | POA: Diagnosis present

## 2017-04-25 DIAGNOSIS — E11649 Type 2 diabetes mellitus with hypoglycemia without coma: Secondary | ICD-10-CM | POA: Diagnosis not present

## 2017-04-25 DIAGNOSIS — Z515 Encounter for palliative care: Secondary | ICD-10-CM | POA: Diagnosis not present

## 2017-04-25 DIAGNOSIS — E872 Acidosis: Secondary | ICD-10-CM | POA: Diagnosis present

## 2017-04-25 DIAGNOSIS — Z9689 Presence of other specified functional implants: Secondary | ICD-10-CM | POA: Diagnosis not present

## 2017-04-25 DIAGNOSIS — J869 Pyothorax without fistula: Secondary | ICD-10-CM | POA: Diagnosis present

## 2017-04-25 DIAGNOSIS — R6520 Severe sepsis without septic shock: Secondary | ICD-10-CM | POA: Diagnosis present

## 2017-04-25 DIAGNOSIS — I4891 Unspecified atrial fibrillation: Secondary | ICD-10-CM | POA: Diagnosis present

## 2017-04-25 DIAGNOSIS — E778 Other disorders of glycoprotein metabolism: Secondary | ICD-10-CM | POA: Diagnosis present

## 2017-04-25 DIAGNOSIS — J9622 Acute and chronic respiratory failure with hypercapnia: Secondary | ICD-10-CM | POA: Diagnosis present

## 2017-04-25 DIAGNOSIS — E1165 Type 2 diabetes mellitus with hyperglycemia: Secondary | ICD-10-CM | POA: Diagnosis present

## 2017-04-25 DIAGNOSIS — J189 Pneumonia, unspecified organism: Secondary | ICD-10-CM | POA: Diagnosis not present

## 2017-04-25 DIAGNOSIS — K121 Other forms of stomatitis: Secondary | ICD-10-CM | POA: Diagnosis not present

## 2017-04-25 DIAGNOSIS — A409 Streptococcal sepsis, unspecified: Secondary | ICD-10-CM | POA: Diagnosis present

## 2017-04-25 DIAGNOSIS — C3492 Malignant neoplasm of unspecified part of left bronchus or lung: Secondary | ICD-10-CM | POA: Diagnosis present

## 2017-04-25 DIAGNOSIS — Y95 Nosocomial condition: Secondary | ICD-10-CM | POA: Diagnosis present

## 2017-04-25 DIAGNOSIS — E43 Unspecified severe protein-calorie malnutrition: Secondary | ICD-10-CM | POA: Diagnosis present

## 2017-04-25 DIAGNOSIS — J9621 Acute and chronic respiratory failure with hypoxia: Secondary | ICD-10-CM | POA: Diagnosis present

## 2017-04-25 DIAGNOSIS — L89891 Pressure ulcer of other site, stage 1: Secondary | ICD-10-CM | POA: Diagnosis present

## 2017-04-25 DIAGNOSIS — R339 Retention of urine, unspecified: Secondary | ICD-10-CM | POA: Diagnosis not present

## 2017-04-25 DIAGNOSIS — L89152 Pressure ulcer of sacral region, stage 2: Secondary | ICD-10-CM | POA: Diagnosis present

## 2017-04-25 LAB — BASIC METABOLIC PANEL
Anion gap: 14 (ref 5–15)
Anion gap: 8 (ref 5–15)
BUN: 31 mg/dL — AB (ref 6–20)
BUN: 33 mg/dL — AB (ref 6–20)
CALCIUM: 9 mg/dL (ref 8.9–10.3)
CALCIUM: 8.4 mg/dL — AB (ref 8.9–10.3)
CO2: 28 mmol/L (ref 22–32)
CO2: 26 mmol/L (ref 22–32)
CREATININE: 0.81 mg/dL (ref 0.61–1.24)
CREATININE: 0.84 mg/dL (ref 0.61–1.24)
Chloride: 96 mmol/L — ABNORMAL LOW (ref 101–111)
Chloride: 103 mmol/L (ref 101–111)
GFR calc Af Amer: >60 mL/min (ref >60)
GFR calc non Af Amer: >60 mL/min (ref >60)
GFR calc non Af Amer: >60 mL/min (ref >60)
GLUCOSE: 311 mg/dL — AB (ref 65–99)
Glucose, Bld: 236 mg/dL — ABNORMAL HIGH (ref 65–99)
Potassium: 5.9 mmol/L — ABNORMAL HIGH (ref 3.5–5.1)
Potassium: 4.1 mmol/L (ref 3.5–5.1)
SODIUM: 137 mmol/L (ref 135–145)
Sodium: 138 mmol/L (ref 135–145)

## 2017-04-25 LAB — I-STAT TROPONIN, ED: TROPONIN I, POC: 0.04 ng/mL (ref 0.00–0.08)

## 2017-04-25 LAB — CBC
HCT: 43.6 % (ref 39.0–52.0)
HEMOGLOBIN: 14.4 g/dL (ref 13.0–17.0)
MCH: 30.8 pg (ref 26.0–34.0)
MCHC: 33 g/dL (ref 30.0–36.0)
MCV: 93.4 fL (ref 78.0–100.0)
PLATELETS: 250 10*3/uL (ref 150–400)
RBC: 4.67 MIL/uL (ref 4.22–5.81)
RDW: 13.5 % (ref 11.5–15.5)
WBC: 26 10*3/uL — ABNORMAL HIGH (ref 4.0–10.5)

## 2017-04-25 LAB — CBG MONITORING, ED
GLUCOSE-CAPILLARY: 206 mg/dL — AB (ref 65–99)
Glucose-Capillary: 243 mg/dL — ABNORMAL HIGH (ref 65–99)

## 2017-04-25 LAB — TROPONIN I
TROPONIN I: 0.03 ng/mL — AB (ref <0.03)
Troponin I: 0.24 ng/mL (ref <0.03)
Troponin I: <0.03 ng/mL (ref <0.03)

## 2017-04-25 LAB — GLUCOSE, CAPILLARY
GLUCOSE-CAPILLARY: 181 mg/dL — AB (ref 65–99)
Glucose-Capillary: 210 mg/dL — ABNORMAL HIGH (ref 65–99)

## 2017-04-25 LAB — LACTIC ACID, PLASMA
LACTIC ACID, VENOUS: 2.3 mmol/L — AB (ref 0.5–1.9)
Lactic Acid, Venous: 3.5 mmol/L (ref 0.5–1.9)
Lactic Acid, Venous: 3.1 mmol/L (ref 0.5–1.9)
Lactic Acid, Venous: 2.5 mmol/L (ref 0.5–1.9)

## 2017-04-25 LAB — I-STAT CG4 LACTIC ACID, ED: Lactic Acid, Venous: 4.3 mmol/L (ref 0.5–1.9)

## 2017-04-25 LAB — MRSA PCR SCREENING: MRSA BY PCR: NEGATIVE

## 2017-04-25 MED ORDER — SODIUM CHLORIDE 0.9 % IV BOLUS (SEPSIS)
500.0000 mL | Freq: Once | INTRAVENOUS | Status: AC
  Administered 2017-04-25: 500 mL via INTRAVENOUS

## 2017-04-25 MED ORDER — ACETAMINOPHEN 325 MG PO TABS: 650.0000 mg | ORAL_TABLET | Freq: Four times a day (QID) | ORAL | Status: DC | PRN

## 2017-04-25 MED ORDER — CEFEPIME HCL 2 G IJ SOLR
2.0000 g | Freq: Once | INTRAMUSCULAR | Status: AC
  Administered 2017-04-25: 2 g via INTRAVENOUS
  Filled 2017-04-25: qty 2

## 2017-04-25 MED ORDER — IPRATROPIUM BROMIDE 0.02 % IN SOLN
0.5000 mg | Freq: Four times a day (QID) | RESPIRATORY_TRACT | Status: DC
  Administered 2017-04-25 – 2017-05-09 (×49): 0.5 mg via RESPIRATORY_TRACT
  Filled 2017-04-25 (×52): qty 2.5

## 2017-04-25 MED ORDER — ONDANSETRON HCL 4 MG/2ML IJ SOLN: 4.0000 mg | Freq: Four times a day (QID) | INTRAMUSCULAR | Status: DC | PRN

## 2017-04-25 MED ORDER — SODIUM CHLORIDE 0.9 % IV SOLN
INTRAVENOUS | Status: DC
  Administered 2017-04-25 – 2017-05-08 (×15): via INTRAVENOUS

## 2017-04-25 MED ORDER — LACTATED RINGERS IV BOLUS (SEPSIS)
1000.0000 mL | Freq: Once | INTRAVENOUS | Status: AC
  Administered 2017-04-25: 1000 mL via INTRAVENOUS

## 2017-04-25 MED ORDER — CEFEPIME HCL 1 G IJ SOLR
1.0000 g | Freq: Three times a day (TID) | INTRAMUSCULAR | Status: DC
  Administered 2017-04-25 – 2017-04-29 (×12): 1 g via INTRAVENOUS
  Filled 2017-04-25 (×13): qty 1

## 2017-04-25 MED ORDER — ORAL CARE MOUTH RINSE
15.0000 mL | Freq: Two times a day (BID) | OROMUCOSAL | Status: DC
  Administered 2017-04-25 – 2017-05-19 (×23): 15 mL via OROMUCOSAL

## 2017-04-25 MED ORDER — PROCHLORPERAZINE MALEATE 10 MG PO TABS
10.0000 mg | ORAL_TABLET | Freq: Four times a day (QID) | ORAL | Status: DC | PRN
  Filled 2017-04-25: qty 1

## 2017-04-25 MED ORDER — LOPERAMIDE HCL 2 MG PO CAPS
2.0000 mg | ORAL_CAPSULE | Freq: Three times a day (TID) | ORAL | Status: DC | PRN
  Administered 2017-05-06: 2 mg via ORAL
  Filled 2017-04-25 (×2): qty 1

## 2017-04-25 MED ORDER — VANCOMYCIN HCL IN DEXTROSE 750-5 MG/150ML-% IV SOLN
750.0000 mg | Freq: Two times a day (BID) | INTRAVENOUS | Status: DC
  Administered 2017-04-25 – 2017-04-27 (×5): 750 mg via INTRAVENOUS
  Filled 2017-04-25 (×5): qty 150

## 2017-04-25 MED ORDER — BUDESONIDE 0.25 MG/2ML IN SUSP
0.2500 mg | Freq: Two times a day (BID) | RESPIRATORY_TRACT | Status: DC
  Administered 2017-04-25 – 2017-05-19 (×46): 0.25 mg via RESPIRATORY_TRACT
  Filled 2017-04-25 (×52): qty 2

## 2017-04-25 MED ORDER — ENOXAPARIN SODIUM 40 MG/0.4ML ~~LOC~~ SOLN
40.0000 mg | Freq: Every day | SUBCUTANEOUS | Status: DC
  Administered 2017-04-26 – 2017-05-07 (×10): 40 mg via SUBCUTANEOUS
  Filled 2017-04-25 (×14): qty 0.4

## 2017-04-25 MED ORDER — ONDANSETRON HCL 4 MG PO TABS: 4.0000 mg | ORAL_TABLET | Freq: Four times a day (QID) | ORAL | Status: DC | PRN

## 2017-04-25 MED ORDER — SODIUM CHLORIDE 0.9 % IV BOLUS (SEPSIS)
1000.0000 mL | Freq: Once | INTRAVENOUS | Status: AC
  Administered 2017-04-25: 1000 mL via INTRAVENOUS

## 2017-04-25 MED ORDER — DEXTROSE 5 % IV SOLN
500.0000 mg | Freq: Every day | INTRAVENOUS | Status: DC
  Administered 2017-04-25 – 2017-04-28 (×5): 500 mg via INTRAVENOUS
  Filled 2017-04-25 (×5): qty 500

## 2017-04-25 MED ORDER — CHLORHEXIDINE GLUCONATE 0.12 % MT SOLN
15.0000 mL | Freq: Two times a day (BID) | OROMUCOSAL | Status: DC
  Administered 2017-04-26 – 2017-05-20 (×40): 15 mL via OROMUCOSAL
  Filled 2017-04-25 (×39): qty 15

## 2017-04-25 MED ORDER — ACETAMINOPHEN 650 MG RE SUPP: 650.0000 mg | Freq: Four times a day (QID) | RECTAL | Status: DC | PRN

## 2017-04-25 MED ORDER — VANCOMYCIN HCL IN DEXTROSE 1-5 GM/200ML-% IV SOLN
1000.0000 mg | Freq: Once | INTRAVENOUS | Status: AC
  Administered 2017-04-25: 1000 mg via INTRAVENOUS
  Filled 2017-04-25: qty 200

## 2017-04-25 MED ORDER — INSULIN ASPART 100 UNIT/ML ~~LOC~~ SOLN
0.0000 [IU] | Freq: Three times a day (TID) | SUBCUTANEOUS | Status: DC
  Administered 2017-04-25 – 2017-04-26 (×5): 3 [IU] via SUBCUTANEOUS
  Administered 2017-04-26: 2 [IU] via SUBCUTANEOUS
  Administered 2017-04-27: 3 [IU] via SUBCUTANEOUS
  Administered 2017-04-27 (×2): 2 [IU] via SUBCUTANEOUS
  Administered 2017-04-28: 3 [IU] via SUBCUTANEOUS
  Administered 2017-04-28 – 2017-04-29 (×5): 2 [IU] via SUBCUTANEOUS
  Administered 2017-04-30 – 2017-05-03 (×4): 1 [IU] via SUBCUTANEOUS
  Administered 2017-05-03: 2 [IU] via SUBCUTANEOUS
  Administered 2017-05-04: 1 [IU] via SUBCUTANEOUS
  Administered 2017-05-04: 2 [IU] via SUBCUTANEOUS
  Administered 2017-05-05: 3 [IU] via SUBCUTANEOUS
  Administered 2017-05-05: 2 [IU] via SUBCUTANEOUS
  Administered 2017-05-06: 1 [IU] via SUBCUTANEOUS
  Administered 2017-05-06 (×2): 2 [IU] via SUBCUTANEOUS
  Administered 2017-05-07 – 2017-05-08 (×4): 1 [IU] via SUBCUTANEOUS
  Administered 2017-05-08 – 2017-05-10 (×4): 2 [IU] via SUBCUTANEOUS
  Administered 2017-05-10 – 2017-05-11 (×2): 3 [IU] via SUBCUTANEOUS
  Administered 2017-05-11 (×2): 2 [IU] via SUBCUTANEOUS
  Administered 2017-05-12: 9 [IU] via SUBCUTANEOUS
  Administered 2017-05-12: 5 [IU] via SUBCUTANEOUS
  Administered 2017-05-12 – 2017-05-13 (×2): 3 [IU] via SUBCUTANEOUS
  Administered 2017-05-13: 2 [IU] via SUBCUTANEOUS
  Administered 2017-05-13: 5 [IU] via SUBCUTANEOUS
  Administered 2017-05-14: 3 [IU] via SUBCUTANEOUS
  Administered 2017-05-14 (×2): 2 [IU] via SUBCUTANEOUS
  Administered 2017-05-15: 1 [IU] via SUBCUTANEOUS
  Administered 2017-05-15: 3 [IU] via SUBCUTANEOUS
  Administered 2017-05-15: 2 [IU] via SUBCUTANEOUS
  Administered 2017-05-16 (×3): 3 [IU] via SUBCUTANEOUS
  Administered 2017-05-17: 2 [IU] via SUBCUTANEOUS
  Administered 2017-05-17: 5 [IU] via SUBCUTANEOUS
  Administered 2017-05-17 – 2017-05-19 (×3): 2 [IU] via SUBCUTANEOUS
  Administered 2017-05-19 (×2): 1 [IU] via SUBCUTANEOUS
  Administered 2017-05-20 (×2): 2 [IU] via SUBCUTANEOUS
  Filled 2017-04-25 (×2): qty 1

## 2017-04-25 MED ORDER — METHYLPREDNISOLONE SODIUM SUCC 40 MG IJ SOLR
40.0000 mg | Freq: Two times a day (BID) | INTRAMUSCULAR | Status: DC
  Administered 2017-04-25 – 2017-04-27 (×6): 40 mg via INTRAVENOUS
  Filled 2017-04-25 (×6): qty 1

## 2017-04-25 NOTE — H&P (Signed)
History and Physical    Edmund Holcomb GYJ:[id number] DOB: [birth year] DOA: 04/24/2017  PCP: [name], MD  Patient coming from: Home.  Chief Complaint: Shortness of breath.  HPI: Maurice Tyler is a 76 y.o. male with history of diabetes mellitus type 2 who was recently diagnosed with non-small cell lung cancer and has been started on chemotherapy last week started developing shortness of breath with productive cough for last 3 days.  Patient also had diarrhea which improved with using of Imodium.  Denies any chest pain.  Patient shortness of breath is present even at rest which has progressively worsened.  Patient did throw up once.  ED Course: In the ER patient is found to be acutely in respiratory distress and had to have a CT angiogram of the chest to rule out pulmonary embolism.  CT angiogram of the chest was negative for pulmonary embolism.  But did show multifocal pneumonia and moderate to large left pleural effusion which as per the radiologist is improved from prior.  Labs revealed marked leukocytosis with a BNP of 458.  Lactic acid was mildly elevated.  Patient was started on empiric antibiotics for healthcare associated pneumonia along with Zithromax blood cultures obtained and since patient is quite short of breath I have ordered BiPAP.  On my exam patient is wheezing for which I have ordered nebulizer treatment with IV steroids.  Review of Systems: As per HPI, rest all negative.   Past Medical History:  Diagnosis Date  . Diabetes mellitus without complication ([place])   . Malignant neoplasm of unspecified part of unspecified bronchus or lung ([place]) 03/30/2017    Past Surgical History:  Procedure Laterality Date  . IR THORACENTESIS ASP PLEURAL SPACE W/IMG GUIDE  03/16/2017  . VIDEO BRONCHOSCOPY Bilateral 03/25/2017   Procedure: VIDEO BRONCHOSCOPY WITH FLUORO;  Surgeon: Juanito Doom, MD;  Location: [facility];  Service: Cardiopulmonary;  Laterality: Bilateral;      reports that he quit smoking about 5 months ago. His smoking use included cigarettes. He quit after 58.00 years of use. he has never used smokeless tobacco. He reports that he does not drink alcohol or use drugs.  No Known Allergies  Family History  Problem Relation Age of Onset  . Cancer Mother     Prior to Admission medications   Medication Sig Start Date End Date Taking? Authorizing Provider  acetaminophen (TYLENOL) 500 MG tablet Take 1,000 mg by mouth every 6 (six) hours as needed for moderate pain.   Yes [provider]  albuterol (PROVENTIL HFA;VENTOLIN HFA) 108 (90 Base) MCG/ACT inhaler Inhale 1 puff every 6 (six) hours as needed into the lungs for wheezing or shortness of breath.   Yes [provider]  loperamide (IMODIUM A-D) 2 MG tablet Take 2 mg by mouth 3 (three) times daily as needed for diarrhea or loose stools.   Yes [provider]  metFORMIN (GLUCOPHAGE) 500 MG tablet Take 500 mg 2 (two) times daily with a meal by mouth.    Yes [provider]  methylPREDNISolone (MEDROL DOSEPAK) 4 MG TBPK tablet Take per package instructions Patient taking differently: as directed. Take 6 tablets on Day 1, Take 5 tablets on Day 2, Take 4 tablets on Day 3, Take 3 tablets on Day 4, Take 2 tablets on Day 5 and take 1 tablet on Day 6. 04/24/17  Yes Curt Bears, MD  prochlorperazine (COMPAZINE) 10 MG tablet Take 1 tablet (10 mg total) by mouth every 6 (six) hours as needed for  nausea or vomiting. 04/24/17  Yes Kyung Rudd, MD  ACCU-CHEK FASTCLIX LANCETS [place]  03/19/17   [provider]  ACCU-CHEK GUIDE test strip USE TO CHECK BLOOD SUGAR BID 03/23/17   [provider]  cetirizine (ZYRTEC) 10 MG tablet Take 10 mg daily by mouth. ALLERTEC    [provider]  nystatin (MYCOSTATIN) 100000 UNIT/ML suspension SWISH AND SWALLOW 5 MLS PO  AC AND QHS FOR 7 DAYS 03/31/17   [provider]    Physical Exam: Vitals:    04/24/17 2147 04/24/17 2200 04/24/17 2230 04/24/17 2342  BP: 127/64 118/75 (!) 136/58 127/60  Pulse: (!) 124 (!) 131 (!) 129 91  Resp: (!) 37 (!) 33 (!) 23 (!) 33  Temp:      TempSrc:      SpO2: 95% 98% 98% 97%      Constitutional: Moderately built and nourished. Vitals:   04/24/17 2147 04/24/17 2200 04/24/17 2230 04/24/17 2342  BP: 127/64 118/75 (!) 136/58 127/60  Pulse: (!) 124 (!) 131 (!) 129 91  Resp: (!) 37 (!) 33 (!) 23 (!) 33  Temp:      TempSrc:      SpO2: 95% 98% 98% 97%   Eyes: Anicteric no pallor. ENMT: No discharge from the ears eyes nose or mouth. Neck: No mass felt.  No JVD appreciated. Respiratory: Bilateral expiratory wheeze heard no crepitations. Cardiovascular: S1-S2 heard no murmurs appreciated. Abdomen: Soft nontender bowel sounds present. Musculoskeletal: No edema.  No joint effusion. Skin: No rash.  Skin appears warm. Neurologic: Alert awake oriented to time place and person.  Moves all extremities. Psychiatric: Appears normal.  Normal affect.   Labs on Admission: I have personally reviewed following labs and imaging studies  CBC: Recent Labs  Lab 04/22/17 1428 04/24/17 2054  WBC 13.1* 27.5*  NEUTROABS 12.3* 25.8*  HGB 14.4 16.4  HCT 44.6 48.5  MCV 91.5 93.3  PLT 207 782   Basic Metabolic Panel: Recent Labs  Lab 04/22/17 1428 04/24/17 2054  NA 140 138  K 3.9 4.1  CL  --  99*  CO2 28 22  GLUCOSE 147* 234*  BUN 16.4 26*  CREATININE 0.7 0.74  CALCIUM 9.6 9.4   GFR: Estimated Creatinine Clearance: 78.6 mL/min (by C-G formula based on SCr of 0.74 mg/dL). Liver Function Tests: Recent Labs  Lab 04/22/17 1428 04/24/17 2054  AST 18 26  ALT 22 22  ALKPHOS 110 132*  BILITOT 0.58 0.9  PROT 6.6 7.4  ALBUMIN 3.3* 3.6   No results for input(s): LIPASE, AMYLASE in the last 168 hours. No results for input(s): AMMONIA in the last 168 hours. Coagulation Profile: No results for input(s): INR, PROTIME in the last 168 hours. Cardiac  Enzymes: No results for input(s): CKTOTAL, CKMB, CKMBINDEX, TROPONINI in the last 168 hours. BNP (last 3 results) No results for input(s): PROBNP in the last 8760 hours. HbA1C: No results for input(s): HGBA1C in the last 72 hours. CBG: No results for input(s): GLUCAP in the last 168 hours. Lipid Profile: No results for input(s): CHOL, HDL, LDLCALC, TRIG, CHOLHDL, LDLDIRECT in the last 72 hours. Thyroid Function Tests: No results for input(s): TSH, T4TOTAL, FREET4, T3FREE, THYROIDAB in the last 72 hours. Anemia Panel: No results for input(s): VITAMINB12, FOLATE, FERRITIN, TIBC, IRON, RETICCTPCT in the last 72 hours. Urine analysis: No results found for: COLORURINE, APPEARANCEUR, LABSPEC, PHURINE, GLUCOSEU, HGBUR, BILIRUBINUR, KETONESUR, PROTEINUR, UROBILINOGEN, NITRITE, LEUKOCYTESUR Sepsis Labs: @LABRCNTIP (procalcitonin:4,lacticidven:4) )No results found for this or any previous visit (  from the past 240 hour(s)).   Radiological Exams on Admission: Dg Chest 2 View  Result Date: 04/24/2017 CLINICAL DATA:  Chronic shortness of breath, productive cough EXAM: CHEST  2 VIEW COMPARISON:  04/08/2017 FINDINGS: Large left perihilar mass. Moderate left pleural effusion which appears partially loculated. Right lung is clear. Stable cardiomediastinal silhouette. Thoracic aortic atherosclerosis. No acute osseous abnormality. IMPRESSION: Left perihilar soft tissue mass consistent with known malignancy. Moderate left pleural effusion improved compared with 04/08/2017. Electronically Signed   By: Kathreen Devoid   On: 04/24/2017 21:43   Ct Angio Chest Pe W/cm &/or Wo Cm  Result Date: 04/24/2017 CLINICAL DATA:  Acute onset of shortness of breath, cough and generalized weakness. Known lung cancer, status post radiation therapy. EXAM: CT ANGIOGRAPHY CHEST WITH CONTRAST TECHNIQUE: Multidetector CT imaging of the chest was performed using the standard protocol during bolus administration of intravenous contrast.  Multiplanar CT image reconstructions and MIPs were obtained to evaluate the vascular anatomy. CONTRAST:  150mL ISOVUE-370 IOPAMIDOL (ISOVUE-370) INJECTION 76% COMPARISON:  Chest radiograph performed earlier today at 9:11 p.m., and PET/CT performed 03/23/2017 FINDINGS: Cardiovascular: There is no definite evidence of pulmonary embolus. Evaluation for pulmonary embolus is suboptimal due to motion artifact and in areas of airspace consolidation. The heart is normal in size. Scattered calcification is noted along the aortic arch. The great vessels are grossly unremarkable in appearance, aside from mild mural thrombus along the left subclavian artery. Mediastinum/Nodes: A large mass is again noted along the left side of the mediastinum, completely effacing the left mainstem bronchus and markedly compressing the left main pulmonary artery and its branches. The mass is difficult to fully characterize due to the phase of contrast enhancement, but measures at least 6.5 cm size. Trace pericardial fluid remains within normal limits. No additional mediastinal lymphadenopathy is seen. The thyroid gland is unremarkable. No axillary lymphadenopathy is appreciated. Lungs/Pleura: A moderate to large left-sided pleural effusion is noted, improved from the prior study. Diffuse airspace opacity is noted within the left upper and lower lobes, compatible with pneumonia. Underlying central cavitary lung mass is noted, with air-fluid levels and nodular extension into the left lung apex. Patchy airspace opacities within the right middle lobe are also concerning for pneumonia. Underlying emphysema is noted at the right upper lobe. No pneumothorax is seen. Upper Abdomen: The visualized portions of the liver and spleen are unremarkable. The visualized portions of the pancreas, adrenal glands and kidneys are within normal limits. Musculoskeletal: No acute osseous abnormalities are identified. The visualized musculature is unremarkable in  appearance. Review of the MIP images confirms the above findings. IMPRESSION: 1. No definite evidence of pulmonary embolus. 2. Diffuse airspace opacities within the left upper and lower lung lobes, and mild patchy airspace opacities within the right middle lobe, compatible with multifocal pneumonia. 3. Central cavitary lung mass noted at the left lung, with air-fluid levels and nodular extension into the left lung apex. Associated large mass along the left side of the mediastinum, completely effacing the left mainstem bronchus and markedly compressing the left main pulmonary artery and its branches. This reflects the patient's known lung cancer. 4. Moderate to large left-sided pleural effusion is improved from the prior study. 5. Underlying emphysema noted at the right upper lobe. Electronically Signed   By: Garald Balding M.D.   On: 04/24/2017 23:49    EKG: Independently reviewed.  Sinus tachycardia.  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 Acute respiratory failure with hypoxia with sepsis-likely from multifocal pneumonia and also large left pleural effusion.  I have placed patient on BiPAP for now.  Patient is started on vancomycin cefepime and Zithromax for pneumonia and also I have added Solu-Medrol nebulizer and Pulmicort for wheezing likely from COPD.  Requested pulmonary critical care consult.  IV fluids for now and recheck lactate. 2. Diarrhea -has improved after Imodium.  Follow for any further diarrhea. 3. Diabetes mellitus type 2 -we will hold metformin while inpatient and keep patient on sliding scale coverage. 4. Metastatic stage IV lung cancer -recently started on palliative chemotherapy per Dr. [name].   DVT prophylaxis: Lovenox. Code Status: Full code. Family Communication: Patient's daughter and  wife. Disposition Plan: Home. Consults called: Pulmonary critical care. Admission status: Inpatient.   Rise Patience MD [facility] Pager [phone].  If 7PM-7AM, please contact night-coverage [email or web address] Password [facility]  04/25/2017, 12:09 AM

## 2017-04-25 NOTE — ED Notes (Signed)
Critical care doctor at bedside

## 2017-04-25 NOTE — ED Notes (Signed)
Called respiratory for nebulizer and bipap placement

## 2017-04-25 NOTE — ED Notes (Signed)
Notified ICU charge that there would be delay in transport d/t echo being performed at this time

## 2017-04-25 NOTE — ED Notes (Signed)
Sent hospitalist Erling Conte text to advise of the rise in pt lactic from 3.5 to 4.3 after 500 cc NS bolus

## 2017-04-25 NOTE — Progress Notes (Signed)
Patient will remain on BiPAP at this time. Patient SOB with mild exertion. Will attempt to give patient a "rest" period off of BiPAP at later time if tolerated by patient. RT will continue to monitor patient.

## 2017-04-25 NOTE — ED Notes (Signed)
ECHO tech at bedside.

## 2017-04-25 NOTE — ED Notes (Signed)
Previous documentation on wrong pt. This pt is not transferring to [facility]. Unable to remove from chart

## 2017-04-25 NOTE — ED Notes (Signed)
Date and time results received: 04/25/17 1201 (use smartphrase ".now" to insert current time)  Test: Lactic Critical Value: 3.1  Name of Provider Notified: Florene Glen  Orders Received? Or Actions Taken?: Actions Taken: Notified MD

## 2017-04-25 NOTE — ED Notes (Signed)
Pt denies pain. Pt notified about delay in bed assignment. Pt given warm blanket. Resp at bedside to admin ordered breathing treatments

## 2017-04-25 NOTE — ED Notes (Signed)
CBG 206. RN aware.

## 2017-04-25 NOTE — ED Notes (Signed)
Lactic acid was completed per verbal order post 500 cc fluid bolus

## 2017-04-25 NOTE — Progress Notes (Signed)
PROGRESS NOTE    Maurice Tyler  DZH:[id number] DOB: [birth year] DOA: 04/24/2017 PCP: [name], MD   Brief Narrative:  Maurice Tyler is Maurice Tyler 76 y.o. male with history of diabetes mellitus type 2 who was recently diagnosed with non-small cell lung cancer and has been started on chemotherapy last week started developing shortness of breath with productive cough for last 3 days.  Patient also had diarrhea which improved with using of Imodium.  Denies any chest pain.  Patient shortness of breath is present even at rest which has progressively worsened.  Patient did throw up once.  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 Acute respiratory failure with hypoxia with sepsis-likely from multifocal pneumonia and also large left pleural effusion with possible contribution of COPD exacerbation.  Placed on BIPAP in ED and remains on bipap.  Patient is started on vancomycin cefepime and Zithromax for pneumonia and also I have added Solu-Medrol nebulizer and Pulmicort for wheezing likely from COPD.  Requested pulmonary critical care consult.  IV fluids for now and recheck lactate. 1. Vanc, cefepime, azithro 2. IV steroids, nebs, pulmicort 3. Lactate rising, repeat bolus ordered (this will be 2.5 L), IVF 4. Follow blood cx, sputum cx, urine strep 5. Continue IV abx 6. Will order thoracentesis by IR  7. Appreciate PCCM consult  2. Elevated troponin: likely demand, will follow up repeat.  He denies any CP.  No old EKG's in our system, this one with q's in inferior leads, paired PVC's, no ST-T wave changes clearly suggestive of acs.  Will follow up repeat troponins, echo  pending.   3. Hyperkalemia: repeat improved  4. Diarrhea -has improved after Imodium.  Follow for any further diarrhea.  5. Diabetes mellitus type 2 -we will hold metformin while inpatient and keep patient on sliding scale coverage.  6. Metastatic stage IV lung cancer -recently started on palliative chemotherapy per Dr. [name].  DVT prophylaxis:lovenox Code Status: full code Family Communication: discussed with duaghter Disposition Plan: pending improvement   Consultants:   PCCM  Procedures:   Echo   Antimicrobials:  Anti-infectives (From admission, onward)   Start     Dose/Rate Route Frequency Ordered Stop   04/25/17 1200  vancomycin (VANCOCIN) IVPB 750 mg/150 ml premix     750 mg 150 mL/hr over 60 Minutes Intravenous Every 12 hours 04/25/17 0835     04/25/17 1000  ceFEPIme (MAXIPIME) 1 g in dextrose 5 % 50 mL IVPB     1 g 100 mL/hr over 30 Minutes Intravenous Every 8 hours 04/25/17 0835     04/25/17 0015  ceFEPIme (MAXIPIME) 2 g in dextrose 5 % 50 mL IVPB     2 g 100 mL/hr over 30 Minutes Intravenous  Once 04/25/17 0001 04/25/17 0119   04/25/17 0015  vancomycin (VANCOCIN) IVPB 1000 mg/200 mL premix     1,000 mg 200 mL/hr over 60 Minutes Intravenous  Once 04/25/17 0001 04/25/17 0259   04/25/17 0015  azithromycin (ZITHROMAX) 500 mg in dextrose 5 % 250 mL IVPB     500 mg 250 mL/hr over 60 Minutes Intravenous Daily at bedtime 04/25/17 0009  Subjective: SOB.  Denies CP.   Objective: Vitals:   04/25/17 0645 04/25/17 0700 04/25/17 0715 04/25/17 0737  BP:  (!) 127/57    Pulse: (!) 122   (!) 124  Resp: (!) 34 (!) 30 (!) 32 (!) 33  Temp:      TempSrc:      SpO2: 99% 100%  100%   No intake or output data in the 24 hours ending 04/25/17 0941 There were no vitals filed for this visit.  Examination:  General exam: Appears uncomfortable Respiratory system: Increased WOB, on bipap Cardiovascular system: S1 & S2 heard, tachy. No JVD, murmurs, rubs, gallops or  clicks. No pedal edema. Gastrointestinal system: Abdomen is nondistended, soft and nontender. No organomegaly or masses felt. Normal bowel sounds heard. Central nervous system: Alert and oriented. No focal neurological deficits. Extremities: no LEE Skin: No rashes, lesions or ulcers Psychiatry: Judgement and insight appear normal. Mood & affect appropriate.    Data Reviewed: I have personally reviewed following labs and imaging studies  CBC: Recent Labs  Lab 04/22/17 1428 04/24/17 2054 04/25/17 0500  WBC 13.1* 27.5* 26.0*  NEUTROABS 12.3* 25.8*  --   HGB 14.4 16.4 14.4  HCT 44.6 48.5 43.6  MCV 91.5 93.3 93.4  PLT 207 242 607   Basic Metabolic Panel: Recent Labs  Lab 04/22/17 1428 04/24/17 2054 04/25/17 0500  NA 140 138 138  K 3.9 4.1 5.9*  CL  --  99* 96*  CO2 28 22 28   GLUCOSE 147* 234* 311*  BUN 16.4 26* 31*  CREATININE 0.7 0.74 0.81  CALCIUM 9.6 9.4 9.0   GFR: Estimated Creatinine Clearance: 77.7 mL/min (by C-G formula based on SCr of 0.81 mg/dL). Liver Function Tests: Recent Labs  Lab 04/22/17 1428 04/24/17 2054  AST 18 26  ALT 22 22  ALKPHOS 110 132*  BILITOT 0.58 0.9  PROT 6.6 7.4  ALBUMIN 3.3* 3.6   No results for input(s): LIPASE, AMYLASE in the last 168 hours. No results for input(s): AMMONIA in the last 168 hours. Coagulation Profile: No results for input(s): INR, PROTIME in the last 168 hours. Cardiac Enzymes: Recent Labs  Lab 04/25/17 0555  TROPONINI 0.24*   BNP (last 3 results) No results for input(s): PROBNP in the last 8760 hours. HbA1C: No results for input(s): HGBA1C in the last 72 hours. CBG: Recent Labs  Lab 04/25/17 0741  GLUCAP 243*   Lipid Profile: No results for input(s): CHOL, HDL, LDLCALC, TRIG, CHOLHDL, LDLDIRECT in the last 72 hours. Thyroid Function Tests: No results for input(s): TSH, T4TOTAL, FREET4, T3FREE, THYROIDAB in the last 72 hours. Anemia Panel: No results for input(s): VITAMINB12, FOLATE, FERRITIN,  TIBC, IRON, RETICCTPCT in the last 72 hours. Sepsis Labs: Recent Labs  Lab 04/24/17 2240 04/25/17 0452 04/25/17 0828  LATICACIDVEN 2.72* 3.5* 4.30*    No results found for this or any previous visit (from the past 240 hour(s)).       Radiology Studies: Dg Chest 2 View  Result Date: 04/24/2017 CLINICAL DATA:  Chronic shortness of breath, productive cough EXAM: CHEST  2 VIEW COMPARISON:  04/08/2017 FINDINGS: Large left perihilar mass. Moderate left pleural effusion which appears partially loculated. Right lung is clear. Stable cardiomediastinal silhouette. Thoracic aortic atherosclerosis. No acute osseous abnormality. IMPRESSION: Left perihilar soft tissue mass consistent with known malignancy. Moderate left pleural effusion improved compared with 04/08/2017. Electronically Signed   By: Kathreen Devoid   On: 04/24/2017 21:43   Ct Angio Chest Pe W/cm &/or Wo  Cm  Result Date: 04/24/2017 CLINICAL DATA:  Acute onset of shortness of breath, cough and generalized weakness. Known lung cancer, status post radiation therapy. EXAM: CT ANGIOGRAPHY CHEST WITH CONTRAST TECHNIQUE: Multidetector CT imaging of the chest was performed using the standard protocol during bolus administration of intravenous contrast. Multiplanar CT image reconstructions and MIPs were obtained to evaluate the vascular anatomy. CONTRAST:  112mL ISOVUE-370 IOPAMIDOL (ISOVUE-370) INJECTION 76% COMPARISON:  Chest radiograph performed earlier today at 9:11 p.m., and PET/CT performed 03/23/2017 FINDINGS: Cardiovascular: There is no definite evidence of pulmonary embolus. Evaluation for pulmonary embolus is suboptimal due to motion artifact and in areas of airspace consolidation. The heart is normal in size. Scattered calcification is noted along the aortic arch. The great vessels are grossly unremarkable in appearance, aside from mild mural thrombus along the left subclavian artery. Mediastinum/Nodes: Rafik Koppel large mass is again noted along the  left side of the mediastinum, completely effacing the left mainstem bronchus and markedly compressing the left main pulmonary artery and its branches. The mass is difficult to fully characterize due to the phase of contrast enhancement, but measures at least 6.5 cm size. Trace pericardial fluid remains within normal limits. No additional mediastinal lymphadenopathy is seen. The thyroid gland is unremarkable. No axillary lymphadenopathy is appreciated. Lungs/Pleura: Sharlene Mccluskey moderate to large left-sided pleural effusion is noted, improved from the prior study. Diffuse airspace opacity is noted within the left upper and lower lobes, compatible with pneumonia. Underlying central cavitary lung mass is noted, with air-fluid levels and nodular extension into the left lung apex. Patchy airspace opacities within the right middle lobe are also concerning for pneumonia. Underlying emphysema is noted at the right upper lobe. No pneumothorax is seen. Upper Abdomen: The visualized portions of the liver and spleen are unremarkable. The visualized portions of the pancreas, adrenal glands and kidneys are within normal limits. Musculoskeletal: No acute osseous abnormalities are identified. The visualized musculature is unremarkable in appearance. Review of the MIP images confirms the above findings. IMPRESSION: 1. No definite evidence of pulmonary embolus. 2. Diffuse airspace opacities within the left upper and lower lung lobes, and mild patchy airspace opacities within the right middle lobe, compatible with multifocal pneumonia. 3. Central cavitary lung mass noted at the left lung, with air-fluid levels and nodular extension into the left lung apex. Associated large mass along the left side of the mediastinum, completely effacing the left mainstem bronchus and markedly compressing the left main pulmonary artery and its branches. This reflects the patient's known lung cancer. 4. Moderate to large left-sided pleural effusion is improved  from the prior study. 5. Underlying emphysema noted at the right upper lobe. Electronically Signed   By: Garald Balding M.D.   On: 04/24/2017 23:49        Scheduled Meds: . budesonide (PULMICORT) nebulizer solution  0.25 mg Nebulization BID  . enoxaparin (LOVENOX) injection  40 mg Subcutaneous QHS  . insulin aspart  0-9 Units Subcutaneous TID WC  . ipratropium  0.5 mg Nebulization Q6H  . levalbuterol  0.63 mg Nebulization Q6H  . methylPREDNISolone (SOLU-MEDROL) injection  40 mg Intravenous BID   Continuous Infusions: . azithromycin Stopped (04/25/17 0431)  . ceFEPime (MAXIPIME) IV    . vancomycin       LOS: 0 days    Time spent: over 30 min    Fayrene Helper, MD [facility] Pager [phone]  If 7PM-7AM, please contact night-coverage [email or web address] Password [facility] 04/25/2017, 9:41 AM

## 2017-04-25 NOTE — Progress Notes (Signed)
  Echocardiogram 2D Echocardiogram has been performed.  Joelene Millin 04/25/2017, 3:26 PM

## 2017-04-25 NOTE — Consult Note (Signed)
PULMONARY / CRITICAL CARE MEDICINE   Name: Maurice Tyler MRN: [id number] DOB: 27-Apr-1941    ADMISSION DATE:  04/24/2017 CONSULTATION DATE: 04/25/2017  REFERRING MD: Hal Hope  CHIEF COMPLAINT: Critical care consulted for multifocal pneumonia acute hypercapnic and hypoxemic respiratory failure.  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  HPI is limited due to patient shortness of breath and need for BiPAP.  He reports 2 days of worsening shortness of breath.  He does not report any hemoptysis or productive sputum.  He denies fevers.  He has loose stool but not frequently.  He threw up after taking his pills this morning there was no hematemesis and there was only.  He understands the extent of his cancer.  He reports that he wishes to remain full code.  He feels his shortness of breath and effort of breathing is improved since coming to the emergency department and started on BiPAP.  PAST MEDICAL HISTORY :  He  has a past medical history of Diabetes mellitus without complication ([place]) and Malignant neoplasm of unspecified part of unspecified bronchus or lung ([place]) (03/30/2017).  PAST SURGICAL HISTORY: He  has a past surgical history that includes IR THORACENTESIS ASP PLEURAL SPACE W/IMG GUIDE (03/16/2017) and Video bronchoscopy (Bilateral, 03/25/2017).  No Known Allergies  No current facility-administered medications on file prior to encounter.    Current Outpatient Medications on File Prior to Encounter  Medication Sig  . acetaminophen (TYLENOL) 500 MG tablet Take 1,000 mg by mouth every 6 (six) hours as needed for moderate pain.   Marland Kitchen albuterol (PROVENTIL HFA;VENTOLIN HFA) 108 (90 Base) MCG/ACT inhaler Inhale 1 puff every 6 (six) hours as needed into the lungs for wheezing or shortness of breath.  . loperamide (IMODIUM A-D) 2 MG tablet Take 2 mg by mouth 3 (three) times daily as needed for diarrhea or loose stools.  . metFORMIN (GLUCOPHAGE) 500 MG tablet Take 500 mg 2 (two) times daily with a meal by mouth.   . methylPREDNISolone (MEDROL DOSEPAK) 4 MG TBPK tablet Take per package instructions (Patient taking differently: as directed. Take 6 tablets on Day 1, Take 5 tablets on Day 2, Take 4 tablets on Day 3, Take 3 tablets on Day 4, Take 2 tablets on Day 5 and take 1 tablet on Day 6.)  . prochlorperazine (COMPAZINE) 10 MG tablet Take 1 tablet (10 mg total) by mouth every 6 (six) hours as needed for nausea or vomiting.  Marland Kitchen ACCU-CHEK FASTCLIX LANCETS MISC   . ACCU-CHEK GUIDE test strip USE TO CHECK BLOOD SUGAR BID  . cetirizine (ZYRTEC) 10 MG tablet Take 10 mg daily by mouth. ALLERTEC  . nystatin (MYCOSTATIN) 100000 UNIT/ML suspension SWISH AND SWALLOW 5 MLS PO  AC AND QHS FOR 7 DAYS    FAMILY HISTORY:  His indicated that his mother is deceased. He indicated that his father is deceased. He indicated that his sister is deceased. He indicated that his brother is deceased.   SOCIAL HISTORY: He  reports that he quit smoking about 5 months ago. His smoking use included cigarettes. He quit after 58.00 years of use. he has never used smokeless tobacco. He reports that he does not drink alcohol or use drugs.  REVIEW OF  SYSTEMS:   As per HPI.  Additional review of systems is limited due to respiratory distress and difficulty speaking in full sentences as well as need to wear BiPAP during interview  SUBJECTIVE:  As per HPI  VITAL SIGNS: BP 103/89   Pulse (!) 126   Temp 97.9 F (36.6 C) (Oral)   Resp (!) 24   SpO2 97%   HEMODYNAMICS:    VENTILATOR SETTINGS: FiO2 (%):  [50 %] 50 %  INTAKE / OUTPUT: No intake/output data  recorded.  PHYSICAL EXAMINATION: General: Cachectic, older appearing than stated age Neuro: Alert and oriented x3 moves all extremities no focal deficits HEENT: AT/[place], PERRLA, EOMI, MMM Cardiovascular: Tachycardic regular no murmur no cyanosis no edema  capillary refill less than 2 seconds  Lungs: Right lung clear to auscultation left lung uppers crackles mid crackles lower diminished no wheezing Abdomen: Positive bowel sounds soft nontender nondistended Musculoskeletal: No red warm swollen tender joints Skin: Poor turgor warm dry no rash  LABS: Respiratory acidosis acute ABG Increased AA gradient, PF ratio 228 Increased BUN creatinine ratio Borderline metabolic alkalosis with hypochloremia elevated anion gap Hyperglycemia Elevated lactic acid 2.72 BNP elevated score >400 Leukocytosis with left shift   BMET Recent Labs  Lab 04/22/17 1428 04/24/17 2054  NA 140 138  K 3.9 4.1  CL  --  99*  CO2 28 22  BUN 16.4 26*  CREATININE 0.7 0.74  GLUCOSE 147* 234*    Electrolytes Recent Labs  Lab 04/22/17 1428 04/24/17 2054  CALCIUM 9.6 9.4    CBC Recent Labs  Lab 04/22/17 1428 04/24/17 2054  WBC 13.1* 27.5*  HGB 14.4 16.4  HCT 44.6 48.5  PLT 207 242    Coag's No results for input(s): APTT, INR in the last 168 hours.  Sepsis Markers Recent Labs  Lab 04/24/17 2240  LATICACIDVEN 2.72*    ABG Recent Labs  Lab 04/25/17 0028  PHART 7.340*  PCO2ART 48.3*  PO2ART 119*    Liver Enzymes Recent Labs  Lab 04/22/17 1428 04/24/17 2054  AST 18 26  ALT 22 22  ALKPHOS 110 132*  BILITOT 0.58 0.9  ALBUMIN 3.3* 3.6    Cardiac Enzymes No results for input(s): TROPONINI, PROBNP in the last 168 hours.  Glucose No results for input(s): GLUCAP in the last 168 hours.  Imaging Dg Chest 2 View  Result Date: 04/24/2017 CLINICAL DATA:  Chronic shortness of breath, productive cough EXAM: CHEST  2 VIEW COMPARISON:  04/08/2017 FINDINGS: Large left perihilar mass.  Moderate left pleural effusion which appears partially loculated. Right lung is clear. Stable cardiomediastinal silhouette. Thoracic aortic atherosclerosis. No acute osseous abnormality. IMPRESSION: Left perihilar soft tissue mass consistent with known malignancy. Moderate left pleural effusion improved compared with 04/08/2017. Electronically Signed   By: Kathreen Devoid   On: 04/24/2017 21:43   Ct Angio Chest Pe W/cm &/or Wo Cm  Result Date: 04/24/2017 CLINICAL DATA:  Acute onset of shortness of breath, cough and generalized weakness. Known lung cancer, status post radiation therapy. EXAM: CT ANGIOGRAPHY CHEST WITH CONTRAST TECHNIQUE: Multidetector CT imaging of the chest was performed using the standard protocol during bolus administration of intravenous contrast. Multiplanar CT image reconstructions and MIPs were obtained to evaluate the vascular anatomy. CONTRAST:  17mL ISOVUE-370 IOPAMIDOL (ISOVUE-370) INJECTION 76% COMPARISON:  Chest radiograph performed earlier today at 9:11 p.m., and PET/CT performed 03/23/2017 FINDINGS: Cardiovascular: There is no definite evidence of pulmonary embolus. Evaluation for pulmonary embolus is suboptimal due to motion artifact and in  areas of airspace consolidation. The heart is normal in size. Scattered calcification is noted along the aortic arch. The great vessels are grossly unremarkable in appearance, aside from mild mural thrombus along the left subclavian artery. Mediastinum/Nodes: A large mass is again noted along the left side of the mediastinum, completely effacing the left mainstem bronchus and markedly compressing the left main pulmonary artery and its branches. The mass is difficult to fully characterize due to the phase of contrast enhancement, but measures at least 6.5 cm size. Trace pericardial fluid remains within normal limits. No additional mediastinal lymphadenopathy is seen. The thyroid gland is unremarkable. No axillary lymphadenopathy is appreciated.  Lungs/Pleura: A moderate to large left-sided pleural effusion is noted, improved from the prior study. Diffuse airspace opacity is noted within the left upper and lower lobes, compatible with pneumonia. Underlying central cavitary lung mass is noted, with air-fluid levels and nodular extension into the left lung apex. Patchy airspace opacities within the right middle lobe are also concerning for pneumonia. Underlying emphysema is noted at the right upper lobe. No pneumothorax is seen. Upper Abdomen: The visualized portions of the liver and spleen are unremarkable. The visualized portions of the pancreas, adrenal glands and kidneys are within normal limits. Musculoskeletal: No acute osseous abnormalities are identified. The visualized musculature is unremarkable in appearance. Review of the MIP images confirms the above findings. IMPRESSION: 1. No definite evidence of pulmonary embolus. 2. Diffuse airspace opacities within the left upper and lower lung lobes, and mild patchy airspace opacities within the right middle lobe, compatible with multifocal pneumonia. 3. Central cavitary lung mass noted at the left lung, with air-fluid levels and nodular extension into the left lung apex. Associated large mass along the left side of the mediastinum, completely effacing the left mainstem bronchus and markedly compressing the left main pulmonary artery and its branches. This reflects the patient's known lung cancer. 4. Moderate to large left-sided pleural effusion is improved from the prior study. 5. Underlying emphysema noted at the right upper lobe. Electronically Signed   By: Garald Balding M.D.   On: 04/24/2017 23:49     STUDIES:  CTA chest 12/21>> suboptimal for pulmonary embolism no central pulmonary embolism on the right is limited due to compression from mass/effusion/infiltrate.  Multifocal infiltrates on the left.  Left cavitary lung mass.  Moderate to large left pleural effusion  CULTURES: 12/21 blood  culture>>  ANTIBIOTICS: Cefepime 12/21>>  Azithromycin 12/21>> Vancomycin 12/21>>  SIGNIFICANT EVENTS: Admitted 12/21  LINES/TUBES: BiPAP 12/21  DISCUSSION: CTA of chest shows no PE, left multifocal pneumonia, left lung mass with cavitary lesion in pleural effusio EKG sinus tachycardia overall poor quality does not appear to have any ST segment changes consistent with ischemia  Problems Severe sepsis due to Multifocal pneumonia due to unknown organism but at risk for healthcare associated opportunistic infections due to immunocompromise state Possible AK ideation.  BUN/creatinine ratio Acute respiratory acidosis Gap metabolic acidosis Left pleural effusion malignant left-sided non-small cell lung cancer with metastasis Diabetes  ASSESSMENT / PLAN:  PULMONARY A: Acute hypoxemic and hypercapnic respiratory failure due to multifocal pneumonia Left pleural effusion likely malignant possibly parapneumonic or empyema P:   Continue with BiPAP Xopenex,Solu-Medrol, Pulmicort, Atrovent  CARDIOVASCULAR A:  Sinus tachycardia Elevated proBNP P:  Follow-up echo Telemetry  RENAL A:   Acute kidney injury Anion gap metabolic acidosis Acute respiratory acidosis P:   Lactated Ringer's 1 L over 2 hours monitor urine output  GASTROINTESTINAL A:   Anorexia Suspected malnutrition P:  Consult dietitian  HEMATOLOGIC A:   Left non-small cell lung cancer with metastasis on palliative chemo and adjuvant treatment P:  Low molecular weight heparin DVT prophylaxis Outpatient follow-up with oncology  INFECTIOUS A:   Multifocal pneumonia unknown organism P:   Continue with vancomycin, cefepime, azithromycin  ENDOCRINE A:   Diabetes P:   Sliding scale insulin  NEUROLOGIC A:   Pain P:   RASS goal: 0 As needed Tylenol   FAMILY  - Updates: None available  - Inter-disciplinary family meet or Palliative Care meeting due by: 05/01/17  Upon my evaluation, this  patient had a high probability of imminent or life-threatening deterioration due to acute hypoxemic and hypercapnic respiratory failure, severe sepsis  high complexity decision making to assess, manipulate, and support vital organ system failure including  broad-spectrum antibiotics and noninvasive ventilation  I have personally provided 50 minutes of critical care time exclusive of time spent on separately billable procedures and education. Time includes review and summation of previous medical record, laboratory data, radiology results, independent review of chest x-ray, CTA, coordination of care with RN, and monitoring for potential decompensation. Interventions were performed as documented above.  Condition: Critical Prognosis: Guarded, long-term prognosis is likely poor given cachexia/functional status Code Status: Full  Charlesetta Garibaldi, MD  [place] [facility] Pager: [phone]  04/25/2017, 1:45 AM

## 2017-04-25 NOTE — ED Notes (Signed)
Bed: RC78 Expected date:  Expected time:  Means of arrival:  Comments: Rm 3??

## 2017-04-25 NOTE — ED Notes (Signed)
Date and time results received: 04/25/17 0850 (use smartphrase ".now" to insert current time)  Test: Troponin and Lactic Critical Value: Trop 0.24, Lactic acid 4.5  Name of Provider Notified: Florene Glen   Orders Received? Or Actions Taken?: Actions Taken: Notified Florene Glen

## 2017-04-25 NOTE — ED Notes (Signed)
Lactic given to RN and MD.

## 2017-04-25 NOTE — ED Notes (Signed)
Advised pt family member that pt is waiting on a bed in ICU and there are none at this moment. Will keep family and pt updated

## 2017-04-25 NOTE — Progress Notes (Signed)
Pharmacy Antibiotic Note  Maurice Tyler is a 76 y.o. male admitted on 04/24/2017 with pneumonia.  Pharmacy has been consulted for Vanc/cefepime dosing.  Plan: 1) Vancomycin 1g x 1 already given. Start 750mg  IV q12 thereafter (goal AUC 400-500 ) 2) Cefepime 2g x 1 already given. Start cefepime 1g IV q8 thereafter 3) Will check vanc pk/tr as appropriate     Temp (24hrs), Avg:97.9 F (36.6 C), Min:97.8 F (36.6 C), Max:97.9 F (36.6 C)  Recent Labs  Lab 04/22/17 1428 04/24/17 2054 04/24/17 2240 04/25/17 0452 04/25/17 0500  WBC 13.1* 27.5*  --   --  26.0*  CREATININE 0.7 0.74  --   --  0.81  LATICACIDVEN  --   --  2.72* 3.5*  --     Estimated Creatinine Clearance: 77.7 mL/min (by C-G formula based on SCr of 0.81 mg/dL).    No Known Allergies   Thank you for allowing pharmacy to be a part of this patient's care.   Adrian Saran, PharmD, BCPS Pager [phone] 04/25/2017 8:33 AM

## 2017-04-25 NOTE — ED Notes (Signed)
Contact daughter Prudy Feeler first with any information.  Number can be found in demographics of chart.

## 2017-04-25 NOTE — Progress Notes (Signed)
PCCM Interval Note  Chart reviewed and patient evaluated  He received immunotherapy last week, has not tolerated well - lots of side effects, bony pain, diarrhea.   He is more comfortable on BiPAP, has received abx for presumed LLL HCAP.   He has a large L effusion, possible loculated component although I believe I see continuity, probably free flowing. He has been in contact with Dr Servando Snare about a possible pleurex.   Given his acute dyspnea I believe it would be reasonable to see if IR can drain his effusion. If so would not drain dry - this would give [place] room to get the Pleurx placed if that is the ultimate plan.   Will follow with you  Baltazar Apo, MD, PhD 04/25/2017, 11:30 AM [facility] [phone] or if no answer [phone]

## 2017-04-25 NOTE — ED Notes (Signed)
Per night RN, hospitalist gave verbal order to infuse 500 cc NS bolus d/t elevated lactic acid. The order was never placed into computer but was observed to be infusing upon assessment. Verbal order for NS placed by this RN

## 2017-04-25 NOTE — ED Notes (Signed)
ED TO INPATIENT HANDOFF REPORT  Name/Age/Gender Maurice Tyler 76 y.o. male  Code Status    Code Status Orders  (From admission, onward)        Start     Ordered   04/25/17 0008  Full code  Continuous     04/25/17 0009    Code Status History    Date Active Date Inactive Code Status Order ID Comments User Context   This patient has a current code status but no historical code status.      Home/SNF/Other Home      Chief Complaint Tachycardia [R00.0] Hypoxia [R09.02] HCAP (healthcare-associated pneumonia) [J18.9] Acute on chronic respiratory failure with hypoxia ([place]) [J96.21] Malignant neoplasm of left lung, unspecified part of lung ([place]) [C34.92] Acute respiratory failure with hypoxia ([place]) [J96.01]  Level of Care/Admitting Diagnosis ED Disposition    ED Disposition Condition [place]: [place] [[id number]]  Level of Care: Stepdown [14]  Admit to SDU based on following criteria: Respiratory Distress:  Frequent assessment and/or intervention to maintain adequate ventilation/respiration, pulmonary toilet, and respiratory treatment.  Diagnosis: Acute respiratory failure with hypoxia [facility]) [[id number]]  Admitting Physician: Rise Patience [phone]  Attending Physician: Rise Patience [phone]  Estimated length of stay: past midnight tomorrow  Certification:: I certify this patient will need inpatient services for at least 2 midnights  PT Class (Do Not Modify): Inpatient [101]  PT Acc Code (Do Not Modify): Private [1]       Medical History Past Medical History:  Diagnosis Date  . Diabetes mellitus without complication ([place])   . Malignant neoplasm of unspecified part of unspecified bronchus or lung ([place]) 03/30/2017    Allergies No Known Allergies  IV Location/Drains/Wounds Patient Lines/Drains/Airways Status   Active Line/Drains/Airways    Name:   Placement date:   Placement time:   Site:   Days:    Peripheral IV 04/24/17 Right Hand   04/24/17    2057    Hand   1   Peripheral IV 04/25/17 Left Wrist   04/25/17    1239    Wrist   less than 1          Labs/Imaging Results for orders placed or performed during the hospital encounter of 04/24/17 (from the past 48 hour(s))  Comprehensive metabolic panel     Status: Abnormal   Collection Time: 04/24/17  8:54 PM  Result Value Ref Range   Sodium 138 135 - 145 mmol/L   Potassium 4.1 3.5 - 5.1 mmol/L   Chloride 99 (L) 101 - 111 mmol/L   CO2 22 22 - 32 mmol/L   Glucose, Bld 234 (H) 65 - 99 mg/dL   BUN 26 (H) 6 - 20 mg/dL   Creatinine, Ser 0.74 0.61 - 1.24 mg/dL   Calcium 9.4 8.9 - 10.3 mg/dL   Total Protein 7.4 6.5 - 8.1 g/dL   Albumin 3.6 3.5 - 5.0 g/dL   AST 26 15 - 41 U/L   ALT 22 17 - 63 U/L   Alkaline Phosphatase 132 (H) 38 - 126 U/L   Total Bilirubin 0.9 0.3 - 1.2 mg/dL   GFR calc non Af Amer >60 >60 mL/min   GFR calc Af Amer >60 >60 mL/min    Comment: (NOTE) The eGFR has been calculated using the CKD EPI equation. This calculation has not been validated in all clinical situations. eGFR's persistently <60 mL/min signify possible Chronic Kidney Disease.    Anion gap 17 (  H) 5 - 15  CBC with Differential     Status: Abnormal   Collection Time: 04/24/17  8:54 PM  Result Value Ref Range   WBC 27.5 (H) 4.0 - 10.5 K/uL   RBC 5.20 4.22 - 5.81 MIL/uL   Hemoglobin 16.4 13.0 - 17.0 g/dL   HCT 48.5 39.0 - 52.0 %   MCV 93.3 78.0 - 100.0 fL   MCH 31.5 26.0 - 34.0 pg   MCHC 33.8 30.0 - 36.0 g/dL   RDW 13.3 11.5 - 15.5 %   Platelets 242 150 - 400 K/uL   Neutrophils Relative % 89 %   Lymphocytes Relative 1 %   Monocytes Relative 5 %   Eosinophils Relative 0 %   Basophils Relative 0 %   Band Neutrophils 3 %   Metamyelocytes Relative 1 %   Myelocytes 1 %   Promyelocytes Absolute 0 %   Blasts 0 %   nRBC 0 0 /100 WBC   Other 0 %   Neutro Abs 25.8 (H) 1.7 - 7.7 K/uL   Lymphs Abs 0.3 (L) 0.7 - 4.0 K/uL   Monocytes Absolute 1.4  (H) 0.1 - 1.0 K/uL   Eosinophils Absolute 0.0 0.0 - 0.7 K/uL   Basophils Absolute 0.0 0.0 - 0.1 K/uL   WBC Morphology MILD LEFT SHIFT (1-5% METAS, OCC MYELO, OCC BANDS)     Comment: WHITE COUNT CONFIRMED ON SMEAR TOXIC GRANULATION    Smear Review PLATELET COUNT CONFIRMED BY SMEAR   Brain natriuretic peptide     Status: Abnormal   Collection Time: 04/24/17 10:27 PM  Result Value Ref Range   B Natriuretic Peptide 440.6 (H) 0.0 - 100.0 pg/mL  I-stat troponin, ED     Status: None   Collection Time: 04/24/17 10:38 PM  Result Value Ref Range   Troponin i, poc 0.05 0.00 - 0.08 ng/mL   Comment 3            Comment: Due to the release kinetics of cTnI, a negative result within the first hours of the onset of symptoms does not rule out myocardial infarction with certainty. If myocardial infarction is still suspected, repeat the test at appropriate intervals.   I-Stat CG4 Lactic Acid, ED     Status: Abnormal   Collection Time: 04/24/17 10:40 PM  Result Value Ref Range   Lactic Acid, Venous 2.72 (HH) 0.5 - 1.9 mmol/L   Comment NOTIFIED PHYSICIAN   Blood gas, arterial     Status: Abnormal (Preliminary result)   Collection Time: 04/25/17 12:28 AM  Result Value Ref Range   FIO2 50.00    O2 Content PENDING L/min   Delivery systems BILEVEL POSITIVE AIRWAY PRESSURE    Mode BILEVEL POSITIVE AIRWAY PRESSURE    LHR 10 resp/min   Inspiratory PAP 10    Expiratory PAP 5    pH, Arterial 7.340 (L) 7.350 - 7.450   pCO2 arterial 48.3 (H) 32.0 - 48.0 mmHg   pO2, Arterial 119 (H) 83.0 - 108.0 mmHg   Bicarbonate 25.4 20.0 - 28.0 mmol/L   Acid-base deficit 0.5 0.0 - 2.0 mmol/L   O2 Saturation 97.6 %   Patient temperature 98.6    Collection site RIGHT RADIAL    Drawn by [phone]    Sample type ARTERIAL DRAW    Allens test (pass/fail) PASS PASS  Lactic acid, plasma     Status: Abnormal   Collection Time: 04/25/17  4:52 AM  Result Value Ref Range   Lactic Acid, Venous  3.5 (HH) 0.5 - 1.9 mmol/L     Comment: CRITICAL RESULT CALLED TO, READ BACK BY AND VERIFIED WITH: L ADKINS,RN @0650  04/25/17 MKELLY   Basic metabolic panel     Status: Abnormal   Collection Time: 04/25/17  5:00 AM  Result Value Ref Range   Sodium 138 135 - 145 mmol/L   Potassium 5.9 (H) 3.5 - 5.1 mmol/L    Comment: DELTA CHECK NOTED   Chloride 96 (L) 101 - 111 mmol/L   CO2 28 22 - 32 mmol/L   Glucose, Bld 311 (H) 65 - 99 mg/dL   BUN 31 (H) 6 - 20 mg/dL   Creatinine, Ser 0.81 0.61 - 1.24 mg/dL   Calcium 9.0 8.9 - 10.3 mg/dL   GFR calc non Af Amer >60 >60 mL/min   GFR calc Af Amer >60 >60 mL/min    Comment: (NOTE) The eGFR has been calculated using the CKD EPI equation. This calculation has not been validated in all clinical situations. eGFR's persistently <60 mL/min signify possible Chronic Kidney Disease.    Anion gap 14 5 - 15  CBC     Status: Abnormal   Collection Time: 04/25/17  5:00 AM  Result Value Ref Range   WBC 26.0 (H) 4.0 - 10.5 K/uL    Comment: REPEATED TO VERIFY   RBC 4.67 4.22 - 5.81 MIL/uL   Hemoglobin 14.4 13.0 - 17.0 g/dL   HCT 43.6 39.0 - 52.0 %   MCV 93.4 78.0 - 100.0 fL   MCH 30.8 26.0 - 34.0 pg   MCHC 33.0 30.0 - 36.0 g/dL   RDW 13.5 11.5 - 15.5 %   Platelets 250 150 - 400 K/uL  Troponin I     Status: Abnormal   Collection Time: 04/25/17  5:55 AM  Result Value Ref Range   Troponin I 0.24 (HH) <0.03 ng/mL    Comment: CRITICAL RESULT CALLED TO, READ BACK BY AND VERIFIED WITH: P.DOWD,RN 04/25/17 @0848  BY V.WILKINS   CBG monitoring, ED     Status: Abnormal   Collection Time: 04/25/17  7:41 AM  Result Value Ref Range   Glucose-Capillary 243 (H) 65 - 99 mg/dL  I-Stat Troponin, ED (not at [facility])     Status: None   Collection Time: 04/25/17  8:26 AM  Result Value Ref Range   Troponin i, poc 0.04 0.00 - 0.08 ng/mL   Comment 3            Comment: Due to the release kinetics of cTnI, a negative result within the first hours of the onset of symptoms does not rule out myocardial  infarction with certainty. If myocardial infarction is still suspected, repeat the test at appropriate intervals.   I-Stat CG4 Lactic Acid, ED     Status: Abnormal   Collection Time: 04/25/17  8:28 AM  Result Value Ref Range   Lactic Acid, Venous 4.30 (HH) 0.5 - 1.9 mmol/L   Comment NOTIFIED PHYSICIAN   Lactic acid, plasma     Status: Abnormal   Collection Time: 04/25/17 10:56 AM  Result Value Ref Range   Lactic Acid, Venous 3.1 (HH) 0.5 - 1.9 mmol/L    Comment: CRITICAL RESULT CALLED TO, READ BACK BY AND VERIFIED WITH: S.THRONTON,RN 04/25/17 @1158  BY V.WILKINS   Basic metabolic panel     Status: Abnormal   Collection Time: 04/25/17 10:57 AM  Result Value Ref Range   Sodium 137 135 - 145 mmol/L   Potassium 4.1 3.5 - 5.1 mmol/L  Comment: DELTA CHECK NOTED   Chloride 103 101 - 111 mmol/L   CO2 26 22 - 32 mmol/L   Glucose, Bld 236 (H) 65 - 99 mg/dL   BUN 33 (H) 6 - 20 mg/dL   Creatinine, Ser 0.84 0.61 - 1.24 mg/dL   Calcium 8.4 (L) 8.9 - 10.3 mg/dL   GFR calc non Af Amer >60 >60 mL/min   GFR calc Af Amer >60 >60 mL/min    Comment: (NOTE) The eGFR has been calculated using the CKD EPI equation. This calculation has not been validated in all clinical situations. eGFR's persistently <60 mL/min signify possible Chronic Kidney Disease.    Anion gap 8 5 - 15  CBG monitoring, ED     Status: Abnormal   Collection Time: 04/25/17 12:30 PM  Result Value Ref Range   Glucose-Capillary 206 (H) 65 - 99 mg/dL   Dg Chest 2 View  Result Date: 04/24/2017 CLINICAL DATA:  Chronic shortness of breath, productive cough EXAM: CHEST  2 VIEW COMPARISON:  04/08/2017 FINDINGS: Large left perihilar mass. Moderate left pleural effusion which appears partially loculated. Right lung is clear. Stable cardiomediastinal silhouette. Thoracic aortic atherosclerosis. No acute osseous abnormality. IMPRESSION: Left perihilar soft tissue mass consistent with known malignancy. Moderate left pleural effusion  improved compared with 04/08/2017. Electronically Signed   By: Kathreen Devoid   On: 04/24/2017 21:43   Ct Angio Chest Pe W/cm &/or Wo Cm  Result Date: 04/24/2017 CLINICAL DATA:  Acute onset of shortness of breath, cough and generalized weakness. Known lung cancer, status post radiation therapy. EXAM: CT ANGIOGRAPHY CHEST WITH CONTRAST TECHNIQUE: Multidetector CT imaging of the chest was performed using the standard protocol during bolus administration of intravenous contrast. Multiplanar CT image reconstructions and MIPs were obtained to evaluate the vascular anatomy. CONTRAST:  162m ISOVUE-370 IOPAMIDOL (ISOVUE-370) INJECTION 76% COMPARISON:  Chest radiograph performed earlier today at 9:11 p.m., and PET/CT performed 03/23/2017 FINDINGS: Cardiovascular: There is no definite evidence of pulmonary embolus. Evaluation for pulmonary embolus is suboptimal due to motion artifact and in areas of airspace consolidation. The heart is normal in size. Scattered calcification is noted along the aortic arch. The great vessels are grossly unremarkable in appearance, aside from mild mural thrombus along the left subclavian artery. Mediastinum/Nodes: A large mass is again noted along the left side of the mediastinum, completely effacing the left mainstem bronchus and markedly compressing the left main pulmonary artery and its branches. The mass is difficult to fully characterize due to the phase of contrast enhancement, but measures at least 6.5 cm size. Trace pericardial fluid remains within normal limits. No additional mediastinal lymphadenopathy is seen. The thyroid gland is unremarkable. No axillary lymphadenopathy is appreciated. Lungs/Pleura: A moderate to large left-sided pleural effusion is noted, improved from the prior study. Diffuse airspace opacity is noted within the left upper and lower lobes, compatible with pneumonia. Underlying central cavitary lung mass is noted, with air-fluid levels and nodular extension  into the left lung apex. Patchy airspace opacities within the right middle lobe are also concerning for pneumonia. Underlying emphysema is noted at the right upper lobe. No pneumothorax is seen. Upper Abdomen: The visualized portions of the liver and spleen are unremarkable. The visualized portions of the pancreas, adrenal glands and kidneys are within normal limits. Musculoskeletal: No acute osseous abnormalities are identified. The visualized musculature is unremarkable in appearance. Review of the MIP images confirms the above findings. IMPRESSION: 1. No definite evidence of pulmonary embolus. 2. Diffuse airspace opacities within  the left upper and lower lung lobes, and mild patchy airspace opacities within the right middle lobe, compatible with multifocal pneumonia. 3. Central cavitary lung mass noted at the left lung, with air-fluid levels and nodular extension into the left lung apex. Associated large mass along the left side of the mediastinum, completely effacing the left mainstem bronchus and markedly compressing the left main pulmonary artery and its branches. This reflects the patient's known lung cancer. 4. Moderate to large left-sided pleural effusion is improved from the prior study. 5. Underlying emphysema noted at the right upper lobe. Electronically Signed   By: Garald Balding M.D.   On: 04/24/2017 23:49    Pending Labs Unresulted Labs (From admission, onward)   Start     Ordered   05/02/17 0500  Creatinine, serum  (enoxaparin (LOVENOX)    CrCl >/= 30 ml/min)  Weekly,   R    Comments:  while on enoxaparin therapy    04/25/17 0009   04/25/17 1000  Troponin I  Once,   STAT     04/25/17 0901   04/25/17 1000  Lactic acid, plasma  Once,   STAT     04/25/17 0901   04/25/17 0009  Troponin I  Now then every 6 hours,   R     04/25/17 0009   04/25/17 0007  Culture, sputum-assessment  Once,   R     04/25/17 0009   04/25/17 0007  Gram stain  Once,   R     04/25/17 0009   04/25/17 0007  Strep  pneumoniae urinary antigen  Once,   R     04/25/17 0009   04/25/17 0007  Legionella Pneumophila Serogp 1 Ur Ag  Once,   R     04/25/17 0009   04/24/17 2225  Culture, blood (routine x 2)  BLOOD CULTURE X 2,   STAT     04/24/17 2224   04/24/17 2058  Urinalysis, Routine w reflex microscopic  STAT,   STAT     04/24/17 2058      Vitals/Pain Today's Vitals   04/25/17 1345 04/25/17 1357 04/25/17 1400 04/25/17 1430  BP:   111/73 105/72  Pulse: (!) 41  (!) 123 (!) 52  Resp: (!) 25 (!) 33 (!) 32 (!) 24  Temp:      TempSrc:      SpO2: 98% 98% 98% 99%  PainSc:        Isolation Precautions No active isolations  Medications Medications  levalbuterol (XOPENEX) nebulizer solution 0.63 mg (0.63 mg Nebulization Given 04/25/17 1357)  loperamide (IMODIUM) capsule 2 mg (not administered)  prochlorperazine (COMPAZINE) tablet 10 mg (not administered)  acetaminophen (TYLENOL) tablet 650 mg (not administered)    Or  acetaminophen (TYLENOL) suppository 650 mg (not administered)  ondansetron (ZOFRAN) tablet 4 mg (not administered)    Or  ondansetron (ZOFRAN) injection 4 mg (not administered)  insulin aspart (novoLOG) injection 0-9 Units (3 Units Subcutaneous Given 04/25/17 1237)  enoxaparin (LOVENOX) injection 40 mg (40 mg Subcutaneous Not Given 04/25/17 0703)  azithromycin (ZITHROMAX) 500 mg in dextrose 5 % 250 mL IVPB (0 mg Intravenous Stopped 04/25/17 0431)  budesonide (PULMICORT) nebulizer solution 0.25 mg (0.25 mg Nebulization Given 04/25/17 0737)  methylPREDNISolone sodium succinate (SOLU-MEDROL) 40 mg/mL injection 40 mg (40 mg Intravenous Given 04/25/17 1043)  ipratropium (ATROVENT) nebulizer solution 0.5 mg (0.5 mg Nebulization Given 04/25/17 1357)  ceFEPIme (MAXIPIME) 1 g in dextrose 5 % 50 mL IVPB (0 g Intravenous Stopped 04/25/17 1128)  vancomycin (VANCOCIN) IVPB 750 mg/150 ml premix (0 mg Intravenous Stopped 04/25/17 1352)  0.9 %  sodium chloride infusion ( Intravenous  Transfusing/Transfer 04/25/17 1445)  iopamidol (ISOVUE-370) 76 % injection (100 mLs Intravenous Contrast Given 04/24/17 2310)  ceFEPIme (MAXIPIME) 2 g in dextrose 5 % 50 mL IVPB (0 g Intravenous Stopped 04/25/17 0119)  vancomycin (VANCOCIN) IVPB 1000 mg/200 mL premix (0 mg Intravenous Stopped 04/25/17 0259)  lactated ringers bolus 1,000 mL (0 mLs Intravenous Stopped 04/25/17 0642)  sodium chloride 0.9 % bolus 500 mL (0 mLs Intravenous Stopped 04/25/17 0803)  sodium chloride 0.9 % bolus 1,000 mL (0 mLs Intravenous Stopped 04/25/17 1217)    Mobility non-ambulatory

## 2017-04-26 ENCOUNTER — Inpatient Hospital Stay (HOSPITAL_COMMUNITY): Payer: Medicare Other

## 2017-04-26 ENCOUNTER — Encounter (HOSPITAL_COMMUNITY): Payer: Self-pay | Admitting: Internal Medicine

## 2017-04-26 DIAGNOSIS — J90 Pleural effusion, not elsewhere classified: Secondary | ICD-10-CM

## 2017-04-26 LAB — URINALYSIS, ROUTINE W REFLEX MICROSCOPIC
BACTERIA UA: NONE SEEN
BILIRUBIN URINE: NEGATIVE
Glucose, UA: 50 mg/dL — AB
Hgb urine dipstick: NEGATIVE
KETONES UR: 5 mg/dL — AB
LEUKOCYTES UA: NEGATIVE
Nitrite: NEGATIVE
Protein, ur: 30 mg/dL — AB
Specific Gravity, Urine: >1.046 — ABNORMAL HIGH (ref 1.005–1.030)
pH: 5 (ref 5.0–8.0)

## 2017-04-26 LAB — COMPREHENSIVE METABOLIC PANEL
ALBUMIN: 2.5 g/dL — AB (ref 3.5–5.0)
ALK PHOS: 79 U/L (ref 38–126)
ALT: 22 U/L (ref 17–63)
ANION GAP: 8 (ref 5–15)
AST: 25 U/L (ref 15–41)
BILIRUBIN TOTAL: 0.7 mg/dL (ref 0.3–1.2)
BUN: 45 mg/dL — AB (ref 6–20)
CALCIUM: 8.6 mg/dL — AB (ref 8.9–10.3)
CO2: 26 mmol/L (ref 22–32)
Chloride: 106 mmol/L (ref 101–111)
Creatinine, Ser: 0.87 mg/dL (ref 0.61–1.24)
GFR calc Af Amer: >60 mL/min (ref >60)
GFR calc non Af Amer: >60 mL/min (ref >60)
GLUCOSE: 242 mg/dL — AB (ref 65–99)
POTASSIUM: 4.5 mmol/L (ref 3.5–5.1)
SODIUM: 140 mmol/L (ref 135–145)
Total Protein: 5.7 g/dL — ABNORMAL LOW (ref 6.5–8.1)

## 2017-04-26 LAB — CBC
HEMATOCRIT: 41.5 % (ref 39.0–52.0)
HEMOGLOBIN: 13.2 g/dL (ref 13.0–17.0)
MCH: 29.9 pg (ref 26.0–34.0)
MCHC: 31.8 g/dL (ref 30.0–36.0)
MCV: 93.9 fL (ref 78.0–100.0)
Platelets: 226 10*3/uL (ref 150–400)
RBC: 4.42 MIL/uL (ref 4.22–5.81)
RDW: 13.8 % (ref 11.5–15.5)
WBC: 17.7 10*3/uL — AB (ref 4.0–10.5)

## 2017-04-26 LAB — ECHOCARDIOGRAM COMPLETE
AO mean calculated velocity dopler: 108 cm/s
AOASC: 31 cm
AV Area VTI index: 0.89 cm2/m2
AV Mean grad: 6 mmHg
AV Peak grad: 12 mmHg
AV VEL mean LVOT/AV: 0.6
AVAREAMEANVIN: 0.91 cm2/m2
AVPKVEL: 172 cm/s
Ao pk vel: 0.54 m/s
CHL CUP AV PEAK INDEX: 0.8
CHL CUP AV VALUE AREA INDEX: 0.89
CHL CUP RV SYS PRESS: 62 mmHg
FS: 28 % (ref 28–44)
IVS/LV PW RATIO, ED: 1
LA ID, A-P, ES: 29 mm
LA diam end sys: 29 mm
LA diam index: 1.53 cm/m2
LA vol A4C: 43.7 ml
LA vol index: 16.3 mL/m2
LA vol: 30.8 mL
LVOT VTI: 14.7 cm
LVOT area: 2.84 cm2
LVOT peak vel: 92.3 cm/s
LVOTD: 19 mm
LVOTSV: 42 mL
LVOTVTI: 0.6 cm
PW: 12.2 mm — AB (ref 0.6–1.1)
RV LATERAL S' VELOCITY: 22.1 cm/s
TAPSE: 31.7 mm
VTI: 24.7 cm

## 2017-04-26 LAB — PROTEIN, PLEURAL OR PERITONEAL FLUID: Total protein, fluid: <3 g/dL

## 2017-04-26 LAB — ALBUMIN, PLEURAL OR PERITONEAL FLUID: Albumin, Fluid: 1.9 g/dL

## 2017-04-26 LAB — BODY FLUID CELL COUNT WITH DIFFERENTIAL
LYMPHS FL: 5 %
MONOCYTE-MACROPHAGE-SEROUS FLUID: 1 % — AB (ref 50–90)
Neutrophil Count, Fluid: 93 % — ABNORMAL HIGH (ref 0–25)
Total Nucleated Cell Count, Fluid: 78475 cu mm — ABNORMAL HIGH (ref 0–1000)

## 2017-04-26 LAB — GLUCOSE, CAPILLARY
GLUCOSE-CAPILLARY: 209 mg/dL — AB (ref 65–99)
GLUCOSE-CAPILLARY: 198 mg/dL — AB (ref 65–99)
GLUCOSE-CAPILLARY: 168 mg/dL — AB (ref 65–99)
Glucose-Capillary: 210 mg/dL — ABNORMAL HIGH (ref 65–99)

## 2017-04-26 LAB — LACTIC ACID, PLASMA
LACTIC ACID, VENOUS: 2.6 mmol/L — AB (ref 0.5–1.9)
LACTIC ACID, VENOUS: 2 mmol/L — AB (ref 0.5–1.9)

## 2017-04-26 LAB — GLUCOSE, PLEURAL OR PERITONEAL FLUID: Glucose, Fluid: <20 mg/dL

## 2017-04-26 MED ORDER — LIDOCAINE HCL 1 % IJ SOLN
INTRAMUSCULAR | Status: AC
  Filled 2017-04-26: qty 20

## 2017-04-26 NOTE — Progress Notes (Signed)
PULMONARY / CRITICAL CARE MEDICINE   Name: Maurice Tyler MRN: [id number] DOB: [birth year]    ADMISSION DATE:  04/24/2017 CONSULTATION DATE: 04/25/2017  REFERRING MD: Hal Hope  CHIEF COMPLAINT: Critical care consulted for multifocal pneumonia acute hypercapnic and hypoxemic respiratory failure.  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  HPI is limited due to patient shortness of breath and need for BiPAP.  He reports 2 days of worsening shortness of breath.  He does not report any hemoptysis or productive sputum.  He denies fevers.  He has loose stool but not frequently.  He threw up after taking his pills this morning there was no hematemesis and there was only.  He understands the extent of his cancer.  He reports that he wishes to remain full code.  He feels his shortness of breath and effort of breathing is improved since coming to the emergency department and started on BiPAP.  SUBJECTIVE:  Looks better today Comfortable and able to converse on BiPAP  VITAL SIGNS: BP 125/75   Pulse (!) 121   Temp 98.5 F (36.9 C) (Axillary)   Resp (!) 30   Ht 5\' 11"  (1.803 m)   Wt 69.9 kg (154 lb 1.6 oz)   SpO2 99%   BMI 21.49 kg/m   HEMODYNAMICS:    VENTILATOR SETTINGS: FiO2 (%):  [30 %-40 %] 30 %  INTAKE / OUTPUT: I/O last 3 completed shifts: In: 2641.7 [I.V.:2141.7; IV Piggyback:500] Out: 40 [Urine:40]  PHYSICAL EXAMINATION: General: thin, elderly man on BiPAP Neuro: alert and oriented, appropriate, moved all ext HEENT: BiPAP in place Cardiovascular: regular, no M Lungs: no L breath sounds, R clear Abdomen: soft, NT,  benign, positive BS Musculoskeletal: no deformities Skin: no rash  LABS:  BMET Recent Labs  Lab 04/25/17 0500 04/25/17 1057 04/26/17 0311  NA 138 137 140  K 5.9* 4.1 4.5  CL 96* 103 106  CO2 28 26 26   BUN 31* 33* 45*  CREATININE 0.81 0.84 0.87  GLUCOSE 311* 236* 242*    Electrolytes Recent Labs  Lab 04/25/17 0500 04/25/17 1057 04/26/17 0311  CALCIUM 9.0 8.4* 8.6*    CBC Recent Labs  Lab 04/24/17 2054 04/25/17 0500 04/26/17 0311  WBC 27.5* 26.0* 17.7*  HGB 16.4 14.4 13.2  HCT 48.5 43.6 41.5  PLT 242 250 226    Coag's No results for input(s): APTT, INR in the last 168 hours.  Sepsis Markers Recent Labs  Lab 04/25/17 1643 04/25/17 2009 04/25/17 2258  LATICACIDVEN 2.5* 2.3* 2.6*    ABG Recent Labs  Lab 04/25/17 0028  PHART 7.340*  PCO2ART 48.3*  PO2ART 119*    Liver Enzymes Recent Labs  Lab 04/22/17 1428 04/24/17 2054 04/26/17 0311  AST 18 26 25   ALT 22 22 22   ALKPHOS 110 132* 79  BILITOT 0.58 0.9 0.7  ALBUMIN 3.3* 3.6 2.5*    Cardiac Enzymes Recent Labs  Lab 04/25/17 0555 04/25/17 1056 04/25/17 2009  TROPONINI 0.24* 0.03* <0.03    Glucose Recent Labs  Lab 04/25/17 0741 04/25/17 1230 04/25/17 1712 04/25/17 2056 04/26/17 0735  GLUCAP 243* 206* 210* 181* 209*    Imaging No results found.   STUDIES:  CTA chest 12/21>> suboptimal for pulmonary  embolism no central pulmonary embolism on the right is limited due to compression from mass/effusion/infiltrate.  Multifocal infiltrates on the left.  Left cavitary lung mass.  Moderate to large left pleural effusion  CULTURES: 12/21 blood culture>>  ANTIBIOTICS: Cefepime 12/21>>  Azithromycin 12/21>> Vancomycin 12/21>>  SIGNIFICANT EVENTS: Admitted 12/21  LINES/TUBES: BiPAP 12/21>> 12/23  DISCUSSION: CTA of chest shows no PE, left multifocal pneumonia, left lung mass with cavitary lesion in pleural effusio EKG sinus tachycardia overall poor quality does not appear to  have any ST segment changes consistent with ischemia  Problems Severe sepsis due to Multifocal pneumonia due to unknown organism but at risk for healthcare associated opportunistic infections due to immunocompromise state Possible AK injury.  BUN/creatinine ratio Acute respiratory acidosis Gap metabolic acidosis Left pleural effusion malignant left-sided non-small cell lung cancer with metastasis Diabetes  ASSESSMENT / PLAN:  PULMONARY A: Acute hypoxemic and hypercapnic respiratory failure due to multifocal pneumonia Left pleural effusion likely malignant possibly parapneumonic or empyema P:   Should be able to take a break from BiPAP this am, change to prn Xopenex,Solu-Medrol, Pulmicort, Atrovent Had been discussing possible L pleurx w Dr Servando Snare. I believe he will benefit from a L thoracentesis. Fluid partially loculated, should be done in IR. May be able to get the Pleurx in IR as well. If not, then would not drain to completion - some residual effusion will allow the Pleurx placement this admission.   CARDIOVASCULAR A:  Sinus tachycardia, improved Elevated proBNP P:  Follow-up echo Telemetry  RENAL A:   Acute kidney injury Anion gap metabolic acidosis, improved w volume Acute respiratory acidosis, improved P:   Maintenance IVF Follow UOP and BMP  GASTROINTESTINAL A:   Anorexia Suspected malnutrition P:   Nutrition consult  HEMATOLOGIC A:   Left non-small cell lung cancer with metastasis on palliative chemo and adjuvant treatment P:  Low molecular weight heparin DVT prophylaxis Outpatient follow-up with oncology  INFECTIOUS A:   Multifocal pneumonia unknown organism P:   Agree with vancomycin, cefepime, azithromycin pending cx data  ENDOCRINE A:   Diabetes P:   Sliding scale insulin  NEUROLOGIC A:   Pain P:   RASS goal: 0 As needed Tylenol   FAMILY  - Updates: None available  - Inter-disciplinary family meet or Palliative Care meeting  due by: 05/01/17  Baltazar Apo, MD, PhD 04/26/2017, 8:48 AM [place] [phone] or if no answer [phone]

## 2017-04-26 NOTE — Procedures (Addendum)
PROCEDURE SUMMARY:  Successful US guided left thoracentesis. Yielded 1.4 L of turbid dark yellow/brown fluid. Pt tolerated procedure well. No immediate complications.  Specimen was sent for labs. Post procedure CXR shows apical PTX, likely secondary to incomplete re-expansion. Given known extensive lung cancer/mass and ongoing radiation, this is not unexpected.   Pt apparently to have PleurX with TCTS. If they cannot accommodate while pt here, IR would be available later this week as long as fluid analysis clear of infection.  Ascencion Dike PA-C 04/26/2017 12:13 PM

## 2017-04-26 NOTE — Progress Notes (Signed)
PROGRESS NOTE    Maurice Tyler  XLK:[id number] DOB: 09-Apr-1941 DOA: 04/24/2017 PCP: [name], MD   Brief Narrative: Patient is a 76 year old male with past medical history of diabetes type 2, recently diagnosed non-small cell lung cancer , on chemotherapy, who presented to the emergency department with shortness of breath and productive cough.  Patient being currently managed for community-acquired pneumonia and left-sided pleural effusion.   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  Likely postobstructive pneumonia.  Possible contribution of COPD exacerbation also. Patient is off BiPAP and on nasal cannula now. Pulmonary following.  Patient on broad-spectrum antibiotics with vancomycin ,cefepime and Zithromax for pneumonia . Also on Xopenex, Solu-Medrol, Pulmicort and Atrovent. We will follow blood culture, sputum culture  Left-sided pleural effusion: Ordered US thoracocentesis.  Plan for Pleurx catheter placement  Sepsis: Presented with elevated lactate and leukocytosis.  We will continue antibiotics.  Follow-up cultures.  Continue gentle IV fluids.  Elevated troponin: Likely secondary to supply demand ischemia.  Troponin level has normalized.  Denies any chest pain.  EKG did not show any ST changes suggestive of ischemia.  Sinus tachycardia: Will most likely secondary to hypoxia.    Will hold albuterol and continue Xopenex.  Will check echocardiogram.  Hypokalemia: Resolved  Diarrhea:  Resolved  Diabetes type 2: We will continue sliding scale insulin.Started on low dose lantus.  We will continue to monitor the blood glucose.  Metastatic stage IV lung cancer:Recently started on palliative chemotherapy.Follows with Dr. [name].    DVT prophylaxis:Lovenox Code Status: Full Family Communication: Updated the plan and current situation with the daughter Disposition Plan: Home   Consultants: Pulmonary and critical care  Procedures: Echocardiogram  Antimicrobials: Vancomycin, cefepime and azithromycin started on 04/25/17   Subjective: Patient seen and examined the bedside this morning.  Still feeling shortness of breath .  His overall status has improved.  Patient denies any chest pain.  Noted to be sinus tachycardic   Objective: Vitals:   04/26/17 0729 04/26/17 0832 04/26/17 0900 04/26/17 1000  BP:  125/75 134/65 (!) 107/59  Pulse:  (!) 121 (!) 121 (!) 136  Resp:  (!) 30 (!) 30 (!) 30  Temp: 98.5 F (36.9 C)     TempSrc: Axillary     SpO2:  99% 93% 95%  Weight:      Height:        Intake/Output Summary (Last 24 hours) at 04/26/2017 1122 Last data filed at 04/26/2017 1023 Gross per 24 hour  Intake 2641.67 ml  Output 140 ml  Net 2501.67 ml   Filed Weights   04/25/17 1500 04/25/17 1534  Weight: 69.9 kg (154 lb 1.6 oz) 69.9 kg (154 lb 1.6 oz)    Examination:  General exam: Short of breath,average built Respiratory system: Bilateral decreased air entry more on the left, on nasal cannula  Cardiovascular system: Sinus tachycardia, no JVD, murmurs, rubs, gallops or clicks. No pedal edema. Gastrointestinal system: Abdomen is nondistended, soft and nontender. No organomegaly or masses felt. Normal bowel sounds heard.  Central nervous system: Alert and oriented. No focal neurological deficits. Extremities: No edema, no clubbing ,no cyanosis, distal peripheral pulses palpable. Skin: No rashes, lesions or ulcers,no icterus ,no pallor Psychiatry: Judgement and  insight appear normal. Mood & affect appropriate.     Data Reviewed: I have personally reviewed following labs and imaging studies  CBC: Recent Labs  Lab 04/22/17 1428 04/24/17 2054 04/25/17 0500 04/26/17 0311  WBC 13.1* 27.5* 26.0* 17.7*  NEUTROABS 12.3* 25.8*  --   --   HGB 14.4 16.4 14.4 13.2  HCT 44.6 48.5 43.6 41.5  MCV 91.5 93.3 93.4 93.9  PLT 207 242 250 734   Basic Metabolic Panel: Recent Labs  Lab 04/22/17 1428 04/24/17 2054 04/25/17 0500 04/25/17 1057 04/26/17 0311  NA 140 138 138 137 140  K 3.9 4.1 5.9* 4.1 4.5  CL  --  99* 96* 103 106  CO2 28 22 28 26 26   GLUCOSE 147* 234* 311* 236* 242*  BUN 16.4 26* 31* 33* 45*  CREATININE 0.7 0.74 0.81 0.84 0.87  CALCIUM 9.6 9.4 9.0 8.4* 8.6*   GFR: Estimated Creatinine Clearance: 70.3 mL/min (by C-G formula based on SCr of 0.87 mg/dL). Liver Function Tests: Recent Labs  Lab 04/22/17 1428 04/24/17 2054 04/26/17 0311  AST 18 26 25   ALT 22 22 22   ALKPHOS 110 132* 79  BILITOT 0.58 0.9 0.7  PROT 6.6 7.4 5.7*  ALBUMIN 3.3* 3.6 2.5*   No results for input(s): LIPASE, AMYLASE in the last 168 hours. No results for input(s): AMMONIA in the last 168 hours. Coagulation Profile: No results for input(s): INR, PROTIME in the last 168 hours. Cardiac Enzymes: Recent Labs  Lab 04/25/17 0555 04/25/17 1056 04/25/17 2009  TROPONINI 0.24* 0.03* <0.03   BNP (last 3 results) No results for input(s): PROBNP in the last 8760 hours. HbA1C: No results for input(s): HGBA1C in the last 72 hours. CBG: Recent Labs  Lab 04/25/17 0741 04/25/17 1230 04/25/17 1712 04/25/17 2056 04/26/17 0735  GLUCAP 243* 206* 210* 181* 209*   Lipid Profile: No results for input(s): CHOL, HDL, LDLCALC, TRIG, CHOLHDL, LDLDIRECT in the last 72 hours. Thyroid Function Tests: No results for input(s): TSH, T4TOTAL, FREET4, T3FREE, THYROIDAB in the last 72 hours. Anemia Panel: No results for input(s): VITAMINB12, FOLATE, FERRITIN, TIBC, IRON,  RETICCTPCT in the last 72 hours. Sepsis Labs: Recent Labs  Lab 04/25/17 1056 04/25/17 1643 04/25/17 2009 04/25/17 2258  LATICACIDVEN 3.1* 2.5* 2.3* 2.6*    Recent Results (from the past 240 hour(s))  MRSA PCR Screening     Status: None   Collection Time: 04/25/17  3:41 PM  Result Value Ref Range Status   MRSA by PCR NEGATIVE NEGATIVE Final    Comment:        The GeneXpert MRSA Assay (FDA approved for NASAL specimens only), is one component of a comprehensive MRSA colonization surveillance program. It is not intended to diagnose MRSA infection nor to guide or monitor treatment for MRSA infections.          Radiology Studies: Dg Chest 2 View  Result Date: 04/24/2017 CLINICAL DATA:  Chronic shortness of breath, productive cough EXAM: CHEST  2 VIEW COMPARISON:  04/08/2017 FINDINGS: Large left perihilar mass. Moderate left pleural effusion which appears partially loculated. Right lung is clear. Stable cardiomediastinal silhouette. Thoracic aortic atherosclerosis. No acute osseous abnormality. IMPRESSION: Left perihilar soft tissue mass consistent with known malignancy. Moderate left pleural effusion improved compared with 04/08/2017. Electronically Signed   By: Elbert Ewings  Patel   On: 04/24/2017 21:43   Ct Angio Chest Pe W/cm &/or Wo Cm  Result Date: 04/24/2017 CLINICAL DATA:  Acute onset of shortness of breath, cough and generalized weakness. Known lung cancer, status post radiation therapy. EXAM: CT ANGIOGRAPHY CHEST WITH CONTRAST TECHNIQUE: Multidetector CT imaging of the chest was performed using the standard protocol during bolus administration of intravenous contrast. Multiplanar CT image reconstructions and MIPs were obtained to evaluate the vascular anatomy. CONTRAST:  160mL ISOVUE-370 IOPAMIDOL (ISOVUE-370) INJECTION 76% COMPARISON:  Chest radiograph performed earlier today at 9:11 p.m., and PET/CT performed 03/23/2017 FINDINGS: Cardiovascular: There is no definite evidence  of pulmonary embolus. Evaluation for pulmonary embolus is suboptimal due to motion artifact and in areas of airspace consolidation. The heart is normal in size. Scattered calcification is noted along the aortic arch. The great vessels are grossly unremarkable in appearance, aside from mild mural thrombus along the left subclavian artery. Mediastinum/Nodes: A large mass is again noted along the left side of the mediastinum, completely effacing the left mainstem bronchus and markedly compressing the left main pulmonary artery and its branches. The mass is difficult to fully characterize due to the phase of contrast enhancement, but measures at least 6.5 cm size. Trace pericardial fluid remains within normal limits. No additional mediastinal lymphadenopathy is seen. The thyroid gland is unremarkable. No axillary lymphadenopathy is appreciated. Lungs/Pleura: A moderate to large left-sided pleural effusion is noted, improved from the prior study. Diffuse airspace opacity is noted within the left upper and lower lobes, compatible with pneumonia. Underlying central cavitary lung mass is noted, with air-fluid levels and nodular extension into the left lung apex. Patchy airspace opacities within the right middle lobe are also concerning for pneumonia. Underlying emphysema is noted at the right upper lobe. No pneumothorax is seen. Upper Abdomen: The visualized portions of the liver and spleen are unremarkable. The visualized portions of the pancreas, adrenal glands and kidneys are within normal limits. Musculoskeletal: No acute osseous abnormalities are identified. The visualized musculature is unremarkable in appearance. Review of the MIP images confirms the above findings. IMPRESSION: 1. No definite evidence of pulmonary embolus. 2. Diffuse airspace opacities within the left upper and lower lung lobes, and mild patchy airspace opacities within the right middle lobe, compatible with multifocal pneumonia. 3. Central cavitary  lung mass noted at the left lung, with air-fluid levels and nodular extension into the left lung apex. Associated large mass along the left side of the mediastinum, completely effacing the left mainstem bronchus and markedly compressing the left main pulmonary artery and its branches. This reflects the patient's known lung cancer. 4. Moderate to large left-sided pleural effusion is improved from the prior study. 5. Underlying emphysema noted at the right upper lobe. Electronically Signed   By: Garald Balding M.D.   On: 04/24/2017 23:49        Scheduled Meds: . budesonide (PULMICORT) nebulizer solution  0.25 mg Nebulization BID  . chlorhexidine  15 mL Mouth Rinse BID  . enoxaparin (LOVENOX) injection  40 mg Subcutaneous QHS  . insulin aspart  0-9 Units Subcutaneous TID WC  . ipratropium  0.5 mg Nebulization Q6H  . levalbuterol  0.63 mg Nebulization Q6H  . mouth rinse  15 mL Mouth Rinse q12n4p  . methylPREDNISolone (SOLU-MEDROL) injection  40 mg Intravenous BID   Continuous Infusions: . sodium chloride 125 mL/hr at 04/26/17 1017  . azithromycin Stopped (04/25/17 2249)  . ceFEPime (MAXIPIME) IV 1 g (04/26/17 1023)  . vancomycin Stopped (04/26/17 0200)  LOS: 1 day    Time spent: 30 mins    Telvin Reinders Jodie Echevaria, MD [facility] Pager [phone]  If 7PM-7AM, please contact night-coverage [email or web address] Password [email or web address] 04/26/2017, 11:22 AM

## 2017-04-26 NOTE — Progress Notes (Signed)
At 2235, this RT placed pt back on bipap per his request.  RN notified.

## 2017-04-26 NOTE — Progress Notes (Signed)
Pt sustained HR over 200s for a few minutes. PT asymptomatic.  As I called and told Dr. Tawanna Solo pt HR went back down to 140s.  Irven [place], RN

## 2017-04-26 NOTE — Progress Notes (Signed)
CRITICAL VALUE ALERT  Critical Value: Lactic Acid 2.3     Date & Time Notified:  04/26/2017 @ 2130  Provider Notified: Yes  Orders Received/Actions taken: No orders received. Will continue to monitor

## 2017-04-26 NOTE — Progress Notes (Signed)
Pt requesting to come off BIPAP.  RT placed Pt on 6 LPM [place], Pt tolerating well at this time.  RT to monitor and assess as needed.

## 2017-04-26 NOTE — Progress Notes (Signed)
Patient taken off BIPAP and placed on 4LNC. Nebulizer treatment given. Patient is tolerating well at this time. BIPAP on standby at bedside. RT will continue to monitor.

## 2017-04-27 ENCOUNTER — Other Ambulatory Visit: Payer: Self-pay

## 2017-04-27 ENCOUNTER — Telehealth: Payer: Self-pay | Admitting: *Deleted

## 2017-04-27 ENCOUNTER — Ambulatory Visit: Payer: Medicare Other

## 2017-04-27 LAB — GLUCOSE, CAPILLARY
GLUCOSE-CAPILLARY: 185 mg/dL — AB (ref 65–99)
GLUCOSE-CAPILLARY: 184 mg/dL — AB (ref 65–99)
GLUCOSE-CAPILLARY: 185 mg/dL — AB (ref 65–99)
Glucose-Capillary: 206 mg/dL — ABNORMAL HIGH (ref 65–99)

## 2017-04-27 LAB — CBC WITH DIFFERENTIAL/PLATELET
Basophils Absolute: 0 10*3/uL (ref 0.0–0.1)
Basophils Relative: 0 %
EOS PCT: 0 %
Eosinophils Absolute: 0 10*3/uL (ref 0.0–0.7)
HEMATOCRIT: 36.9 % — AB (ref 39.0–52.0)
Hemoglobin: 11.8 g/dL — ABNORMAL LOW (ref 13.0–17.0)
LYMPHS ABS: 0.5 10*3/uL — AB (ref 0.7–4.0)
Lymphocytes Relative: 3 %
MCH: 29.9 pg (ref 26.0–34.0)
MCHC: 32 g/dL (ref 30.0–36.0)
MCV: 93.4 fL (ref 78.0–100.0)
MONOS PCT: 2 %
Monocytes Absolute: 0.4 10*3/uL (ref 0.1–1.0)
Neutro Abs: 16.7 10*3/uL — ABNORMAL HIGH (ref 1.7–7.7)
Neutrophils Relative %: 95 %
Platelets: 211 10*3/uL (ref 150–400)
RBC: 3.95 MIL/uL — AB (ref 4.22–5.81)
RDW: 13.7 % (ref 11.5–15.5)
WBC: 17.6 10*3/uL — AB (ref 4.0–10.5)

## 2017-04-27 LAB — BASIC METABOLIC PANEL
ANION GAP: 5 (ref 5–15)
BUN: 36 mg/dL — ABNORMAL HIGH (ref 6–20)
CHLORIDE: 108 mmol/L (ref 101–111)
CO2: 27 mmol/L (ref 22–32)
Calcium: 8.4 mg/dL — ABNORMAL LOW (ref 8.9–10.3)
Creatinine, Ser: 0.57 mg/dL — ABNORMAL LOW (ref 0.61–1.24)
GFR calc Af Amer: >60 mL/min (ref >60)
GFR calc non Af Amer: >60 mL/min (ref >60)
GLUCOSE: 235 mg/dL — AB (ref 65–99)
POTASSIUM: 4.3 mmol/L (ref 3.5–5.1)
Sodium: 140 mmol/L (ref 135–145)

## 2017-04-27 LAB — PH, BODY FLUID: pH, Body Fluid: 6.1

## 2017-04-27 MED ORDER — GLUCERNA SHAKE PO LIQD
237.0000 mL | Freq: Three times a day (TID) | ORAL | Status: DC
  Administered 2017-04-30 (×2): 237 mL via ORAL
  Filled 2017-04-27 (×13): qty 237

## 2017-04-27 NOTE — Telephone Encounter (Signed)
Called ICU spoke with RN Bryson Ha, patient on c-pap, not stable enough per Charge RN; today for rad tx, will call Wednesday morning, check on patient thanked RN, called and spoke with Jeannetta Nap RT therapist  9:40 AM

## 2017-04-27 NOTE — Progress Notes (Signed)
Initial Nutrition Assessment  DOCUMENTATION CODES:   Non-severe (moderate) malnutrition in context of chronic illness  INTERVENTION:   Glucerna Shake po TID, each supplement provides 220 kcal and 10 grams of protein  NUTRITION DIAGNOSIS:   Moderate Malnutrition related to chronic illness, cancer and cancer related treatments as evidenced by 15.4% weight loss in one month, energy intake < or equal to 50% for > or equal to 1 month, moderate fat depletion, moderate muscle depletion.  GOAL:   Patient will meet greater than or equal to 90% of their needs  MONITOR:   PO intake, Weight trends, Labs, Supplement acceptance  REASON FOR ASSESSMENT:   Malnutrition Screening Tool   ASSESSMENT:   Pt with PMH significant for DM and recently diagnosed with non-small cell lung carcinoma. Pt started chemotherapy last week and has had increasing shortness of breath with productive cough since. Admitted for acute respiratory failure with sepsis from multifocal PNA and large left PE.    Pt currently on BiPap. Spoke with pt at bedside who reports having a loss in appetite for the last three days related to increasingly worse cough. States he typically eats smaller meals throughout the day with at least two Glucerna's per day. Pt admits to having swallowing issues but is actively being seen for them. Cannot tolerate certain textures like dry meats or stringy vegetables. Suspect PO intake has decreased due to swallowing issues as well. Pt would like to continue Glucerna this stay, and is requesting lemonade.   Pt reports a UBW of 180 lb, the last time being at that weight in November 2018. Records indicate pt weighed 182 lb 03/10/17 and 154 lb this admission. This shows a 15.4 % wt loss in one month. This is significant.   Nutrition-Focused physical exam completed.   Medications reviewed and include: SSI, solumedrol, IV abx, NS @ 125 ml/hr Labs reviewed: CBG 235-242 (H) BUN 36 (H)   NUTRITION - FOCUSED  PHYSICAL EXAM:    Most Recent Value  Orbital Region  No depletion  Upper Arm Region  No depletion  Thoracic and Lumbar Region  Unable to assess  Buccal Region  Moderate depletion  Temple Region  Moderate depletion  Clavicle Bone Region  Moderate depletion  Clavicle and Acromion Bone Region  Moderate depletion  Scapular Bone Region  Unable to assess  Dorsal Hand  Mild depletion  Patellar Region  Moderate depletion  Anterior Thigh Region  Moderate depletion  Posterior Calf Region  Moderate depletion  Edema (RD Assessment)  None  Hair  Reviewed  Eyes  Reviewed  Mouth  Unable to assess  Skin  Reviewed  Nails  Reviewed       Diet Order:  Diet heart healthy/carb modified Room service appropriate? Yes; Fluid consistency: Thin  EDUCATION NEEDS:   Education needs have been addressed  Skin:  Skin Assessment: Reviewed RN Assessment  Last BM:  PTA  Height:   Ht Readings from Last 1 Encounters:  04/25/17 5\' 11"  (1.803 m)    Weight:   Wt Readings from Last 1 Encounters:  04/25/17 154 lb 1.6 oz (69.9 kg)    Ideal Body Weight:  78.2 kg  BMI:  Body mass index is 21.49 kg/m.  Estimated Nutritional Needs:   Kcal:  2000-2200 kcal/day  Protein:  100-105 g/day  Fluid:  >2 L/day    Mariana Single RD, LDN Clinical Nutrition Pager # - [phone]

## 2017-04-27 NOTE — Care Management Note (Signed)
Case Management Note  Patient Details  Name: Maurice Tyler MRN: [id number] Date of Birth: [birth year]  Subjective/Objective:                  resp distress and bipap  Action/Plan: Date: April 27, 2017 Velva Harman, BSN, [place], [place] Chart and notes review for patient progress and needs. Will follow for case management and discharge needs. Next review date: [id number] Expected Discharge Date:                  Expected Discharge Plan:  Home/Self Care  In-House Referral:     Discharge planning Services  CM Consult  Post Acute Care Choice:    Choice offered to:     DME Arranged:    DME Agency:     HH Arranged:    HH Agency:     Status of Service:  In process, will continue to follow  If discussed at Long Length of Stay Meetings, dates discussed:    Additional Comments:  Leeroy Cha, RN 04/27/2017, 8:53 AM

## 2017-04-27 NOTE — Progress Notes (Signed)
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  Patient being currently managed for multiofocal pneumonia and left-sided pleural effusion.   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  Likely postobstructive pneumonia.  Possible contribution of COPD exacerbation also. Patient on/off BiPAP .Desaturates on nasal cannula . Pulmonary following.  Patient on broad-spectrum antibiotics with cefepime and Zithromax for pneumonia . Also on Xopenex,  Pulmicort and Atrovent. We will follow blood culture, sputum culture, Pleural fluid culture. CT chest angio showed:  Diffuse airspace opacities within the left upper and lower lung lobes, and mild patchy airspace opacities within the right middle lobe, compatible with multifocal pneumonia. Central cavitary lung mass noted at the left lung, with air-fluid levels and nodular extension into the left lung apex. Associated large mass along the left side of the mediastinum, completely effacing the left mainstem bronchus and markedly compressing the left main pulmonary artery and its branches.  Metastatic  stage IV lung cancer:Recently started on palliative chemotherapy.Follows with Dr. [name].  The patient has a stage IV non-small cell lung cancer.  Patient has left upper lobe lung mass.  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 Was on palliative systemic chemotherapy with carboplatin, paclitaxel and Keytruda.  Left-sided pleural effusion: S/P [place] thoracocentesis with removal of 1.4 L of turbid brown fluid.  Gram stain shows moderate gram-positive cocci in pairs/chains.  We will follow final culture report Plan for Pleurx catheter placement  Sepsis: Presented with elevated lactate and leukocytosis.  We will continue antibiotics.  Follow-up cultures.  Continue gentle IV fluids.  Elevated troponin: Likely secondary to supply demand ischemia.  Troponin level has normalized.  Denies any chest pain.  EKG did not show any ST changes suggestive of ischemia.  Sinus tachycardia: Much improved.Will most likely secondary to hypoxia.   Echocardiogram showed normal left ventricular function, normal ejection fraction, mild concentric hypertrophy  Hypokalemia: Resolved  Diarrhea: Resolved  Diabetes type 2: We will continue sliding scale insulin.  We will continue to monitor the blood glucose.  Hyperglycemia likely secondary to steroids.  Steroids have been discontinued.   DVT prophylaxis:Lovenox Code Status: Full Family Communication: Updated the plan and current situation with the wife/daughter Disposition Plan: Home   Consultants: Pulmonary and critical care  Procedures: Echocardiogram  Antimicrobials: Cefepime and azithromycin started on 04/25/17   Subjective: Patient seen and examined the bedside this morning.  Currently he is on BiPAP.  However he feels better.  Sinus tachycardia has improved  Objective: Vitals:   04/27/17 0800 04/27/17 0900 04/27/17 1000 04/27/17 1221  BP: (!) 119/59 115/60 108/62   Pulse: 95 (!)  112  96 99  Resp: (!) 31 (!) 32 (!) 31 (!) 28  Temp:      TempSrc:      SpO2: 99% 96% 96% 97%  Weight:      Height:        Intake/Output Summary (Last 24 hours) at 04/27/2017 1421 Last data filed at 04/27/2017 1000 Gross per 24 hour  Intake 2825 ml  Output 850 ml  Net 1975 ml   Filed Weights   04/25/17 1500 04/25/17 1534  Weight: 69.9 kg (154 lb 1.6 oz) 69.9 kg (154 lb 1.6 oz)    Examination:  General exam: Appears calm,In mild respiratory distress,average built Respiratory system: Bilateral decreased air entry more on the left ,on Bipap cardiovascular system: S1 & S2 heard, RRR. No JVD, murmurs, rubs, gallops or clicks. No pedal edema. Gastrointestinal system: Abdomen is nondistended, soft and nontender. No organomegaly or masses felt. Normal bowel sounds heard. Central nervous system: Alert and oriented. No focal neurological deficits. Extremities: No edema, no clubbing ,no cyanosis, distal peripheral pulses palpable. Skin: No cyanosis,No pallor,No Rash,No Ulcer Psychiatry: Judgement and insight appear normal. Mood & affect appropriate.    Data Reviewed: I have personally reviewed following labs and imaging studies  CBC: Recent Labs  Lab 04/22/17 1428 04/24/17 2054 04/25/17 0500 04/26/17 0311 04/27/17 0327  WBC 13.1* 27.5* 26.0* 17.7* 17.6*  NEUTROABS 12.3* 25.8*  --   --  16.7*  HGB 14.4 16.4 14.4 13.2 11.8*  HCT 44.6 48.5 43.6 41.5 36.9*  MCV 91.5 93.3 93.4 93.9 93.4  PLT 207 242 250 226 882   Basic Metabolic Panel: Recent Labs  Lab 04/24/17 2054 04/25/17 0500 04/25/17 1057 04/26/17 0311 04/27/17 0327  NA 138 138 137 140 140  K 4.1 5.9* 4.1 4.5 4.3  CL 99* 96* 103 106 108  CO2 22 28 26 26 27   GLUCOSE 234* 311* 236* 242* 235*  BUN 26* 31* 33* 45* 36*  CREATININE 0.74 0.81 0.84 0.87 0.57*  CALCIUM 9.4 9.0 8.4* 8.6* 8.4*   GFR: Estimated Creatinine Clearance: 76.5 mL/min (A) (by C-G formula based on SCr of 0.57 mg/dL (L)). Liver Function Tests: Recent  Labs  Lab 04/22/17 1428 04/24/17 2054 04/26/17 0311  AST 18 26 25   ALT 22 22 22   ALKPHOS 110 132* 79  BILITOT 0.58 0.9 0.7  PROT 6.6 7.4 5.7*  ALBUMIN 3.3* 3.6 2.5*   No results for input(s): LIPASE, AMYLASE in the last 168 hours. No results for input(s): AMMONIA in the last 168 hours. Coagulation Profile: No results for input(s): INR, PROTIME in the last 168 hours. Cardiac Enzymes: Recent Labs  Lab 04/25/17 0555 04/25/17 1056 04/25/17 2009  TROPONINI 0.24* 0.03* <0.03   BNP (last 3 results) No results for input(s): PROBNP in the last 8760 hours. HbA1C: No results for input(s): HGBA1C in the last 72 hours. CBG: Recent Labs  Lab 04/26/17 1114 04/26/17 1630 04/26/17 2108 04/27/17 0727 04/27/17 1311  GLUCAP 198* 210* 168* 206* 185*   Lipid Profile: No results for input(s): CHOL, HDL, LDLCALC, TRIG, CHOLHDL, LDLDIRECT in the last 72 hours. Thyroid Function Tests: No results for input(s): TSH, T4TOTAL, FREET4, T3FREE, THYROIDAB in the last 72 hours. Anemia Panel: No results for input(s): VITAMINB12, FOLATE, FERRITIN, TIBC, IRON, RETICCTPCT in the last 72 hours. Sepsis Labs: Recent Labs  Lab 04/25/17 1643 04/25/17 2009 04/25/17 2258 04/26/17 1242  LATICACIDVEN 2.5* 2.3* 2.6* 2.0*    Recent Results (from the past 240 hour(s))  Culture, blood (routine  x 2)     Status: None (Preliminary result)   Collection Time: 04/24/17 11:41 PM  Result Value Ref Range Status   Specimen Description BLOOD LEFT ANTECUBITAL  Final   Special Requests   Final    BOTTLES DRAWN AEROBIC AND ANAEROBIC Blood Culture results may not be optimal due to an inadequate volume of blood received in culture bottles   Culture   Final    NO GROWTH 2 DAYS Performed at [facility] [address] [place], [place] [address]    Report Status PENDING  Incomplete  MRSA PCR Screening     Status: None   Collection Time: 04/25/17  3:41 PM  Result Value Ref Range Status   MRSA by PCR NEGATIVE  NEGATIVE Final    Comment:        The GeneXpert MRSA Assay (FDA approved for NASAL specimens only), is one component of a comprehensive MRSA colonization surveillance program. It is not intended to diagnose MRSA infection nor to guide or monitor treatment for MRSA infections.   Culture, blood (routine x 2)     Status: None (Preliminary result)   Collection Time: 04/25/17  4:43 PM  Result Value Ref Range Status   Specimen Description BLOOD RIGHT ANTECUBITAL  Final   Special Requests IN PEDIATRIC BOTTLE Blood Culture adequate volume  Final   Culture   Final    NO GROWTH 1 DAY Performed at [facility], [place] [address] [place], [place] [address]    Report Status PENDING  Incomplete  Body fluid culture     Status: None (Preliminary result)   Collection Time: 04/26/17 12:24 PM  Result Value Ref Range Status   Specimen Description PLEURAL LEFT  Final   Special Requests NONE  Final   Gram Stain   Final    ABUNDANT WBC PRESENT, PREDOMINANTLY PMN MODERATE GRAM POSITIVE COCCI IN PAIRS IN CHAINS    Culture   Final    NO GROWTH 1 DAY Performed at [facility], [place] [address], [place], [place] [address]    Report Status PENDING  Incomplete         Radiology Studies: Dg Chest Port 1 View  Result Date: 04/26/2017 CLINICAL DATA:  Status post left thoracentesis today. EXAM: PORTABLE CHEST 1 VIEW COMPARISON:  PA and lateral chest 04/24/2017. FINDINGS: Left pleural effusion is decreased after thoracentesis. There is a left pneumothorax estimated at 15%. Extensive opacity throughout the left long persists. The lungs are emphysematous. Cardiac silhouette is obscured. IMPRESSION: Left apical pneumothorax estimated at 15% after thoracentesis. Extensive airspace disease left lung base consistent with pneumonia. Emphysema. Electronically Signed   By: Inge Rise M.D.   On: 04/26/2017 12:35   US Thoracentesis Asp Pleural Space W/img Guide  Result Date: 04/26/2017 INDICATION:  Lung cancer. Left-sided pleural effusion. Shortness of breath. Request diagnostic and therapeutic thoracentesis. EXAM: ULTRASOUND GUIDED LEFT THORACENTESIS MEDICATIONS: None. COMPLICATIONS: None immediate. PROCEDURE: An ultrasound guided thoracentesis was thoroughly discussed with the patient and questions answered. The benefits, risks, alternatives and complications were also discussed. The patient understands and wishes to proceed with the procedure. Written consent was obtained. Ultrasound was performed to localize and mark an adequate pocket of fluid in the left chest. The area was then prepped and draped in the normal sterile fashion. 1% Lidocaine was used for local anesthesia. Under ultrasound guidance a Safe-T-Centesis catheter was introduced. Thoracentesis was performed. The catheter was removed and a dressing applied. FINDINGS: A total of approximately  1.4 L of turbid, dark yellow/brown fluid was removed. Samples were sent to the laboratory as requested by the clinical team. IMPRESSION: Successful ultrasound guided left thoracentesis yielding 1.4 L of pleural fluid. Read by: Ascencion Dike PA-C Electronically Signed   By: Aletta Edouard M.D.   On: 04/26/2017 12:24        Scheduled Meds: . budesonide (PULMICORT) nebulizer solution  0.25 mg Nebulization BID  . chlorhexidine  15 mL Mouth Rinse BID  . enoxaparin (LOVENOX) injection  40 mg Subcutaneous QHS  . feeding supplement (GLUCERNA SHAKE)  237 mL Oral TID BM  . insulin aspart  0-9 Units Subcutaneous TID WC  . ipratropium  0.5 mg Nebulization Q6H  . levalbuterol  0.63 mg Nebulization Q6H  . mouth rinse  15 mL Mouth Rinse q12n4p   Continuous Infusions: . sodium chloride 125 mL/hr at 04/27/17 0600  . azithromycin Stopped (04/26/17 2339)  . ceFEPime (MAXIPIME) IV Stopped (04/27/17 1115)     LOS: 2 days    Time spent: 30 mins    Latania Bascomb Jodie Echevaria, MD [facility] Pager [phone]  If 7PM-7AM, please contact  night-coverage [email or web address] Password [email or web address] 04/27/2017, 2:21 PM

## 2017-04-27 NOTE — Progress Notes (Signed)
Pt found off bipap, on 6lnc, HR99, rr28-30, spo2 96%,  Pt denies sob/respiratory distress and does not want to go back on bipap at this time.  This RT will continue to monitor and assess for bipap needs.  Bipap remains in room on standby.

## 2017-04-28 ENCOUNTER — Encounter (HOSPITAL_COMMUNITY): Payer: Self-pay | Admitting: Internal Medicine

## 2017-04-28 DIAGNOSIS — L8990 Pressure ulcer of unspecified site, unspecified stage: Secondary | ICD-10-CM | POA: Diagnosis present

## 2017-04-28 DIAGNOSIS — D72829 Elevated white blood cell count, unspecified: Secondary | ICD-10-CM | POA: Insufficient documentation

## 2017-04-28 DIAGNOSIS — E440 Moderate protein-calorie malnutrition: Secondary | ICD-10-CM | POA: Diagnosis present

## 2017-04-28 LAB — CBC WITH DIFFERENTIAL/PLATELET
Basophils Absolute: 0 10*3/uL (ref 0.0–0.1)
Basophils Relative: 0 %
EOS ABS: 0 10*3/uL (ref 0.0–0.7)
Eosinophils Relative: 0 %
HCT: 37.7 % — ABNORMAL LOW (ref 39.0–52.0)
Hemoglobin: 12 g/dL — ABNORMAL LOW (ref 13.0–17.0)
Lymphocytes Relative: 3 %
Lymphs Abs: 0.7 10*3/uL (ref 0.7–4.0)
MCH: 29.9 pg (ref 26.0–34.0)
MCHC: 31.8 g/dL (ref 30.0–36.0)
MCV: 93.8 fL (ref 78.0–100.0)
MONO ABS: 0.2 10*3/uL (ref 0.1–1.0)
Monocytes Relative: 1 %
NEUTROS PCT: 96 %
Neutro Abs: 22.7 10*3/uL — ABNORMAL HIGH (ref 1.7–7.7)
PLATELETS: 251 10*3/uL (ref 150–400)
RBC: 4.02 MIL/uL — AB (ref 4.22–5.81)
RDW: 14 % (ref 11.5–15.5)
WBC: 23.6 10*3/uL — AB (ref 4.0–10.5)

## 2017-04-28 LAB — STREP PNEUMONIAE URINARY ANTIGEN: STREP PNEUMO URINARY ANTIGEN: POSITIVE — AB

## 2017-04-28 LAB — GLUCOSE, CAPILLARY
GLUCOSE-CAPILLARY: 229 mg/dL — AB (ref 65–99)
GLUCOSE-CAPILLARY: 171 mg/dL — AB (ref 65–99)
Glucose-Capillary: 192 mg/dL — ABNORMAL HIGH (ref 65–99)
Glucose-Capillary: 180 mg/dL — ABNORMAL HIGH (ref 65–99)

## 2017-04-28 NOTE — Progress Notes (Signed)
Pt. tolerating N/C well at this time, RT to monitor.

## 2017-04-28 NOTE — Progress Notes (Signed)
Pharmacy Antibiotic Note  Maurice Tyler is a 76 y.o. male admitted on 04/24/2017 with pneumonia.  He continues on Cefepime & Zithromax.  04/28/2017:   Day#4 abx  Afebrile  Leukocytosis- IV steroids stopped  +S Pneumo  Renal function stable  Plan: 1) Continue Cefepime 1g IV q8 -->consider de-escalate to Rocephin as pt doesn't need pseudomonas coverage 2) Continue Zithromax per MD 3) No further dose adjustments anticipated.  Pharmacy will sign off.  Please re-consult if needed.   Height: 5\' 11"  (180.3 cm) Weight: 158 lb 15.2 oz (72.1 kg) IBW/kg (Calculated) : 75.3  Temp (24hrs), Avg:97.8 F (36.6 C), Min:96.9 F (36.1 C), Max:98.6 F (37 C)  Recent Labs  Lab 04/24/17 2054  04/25/17 0500  04/25/17 1056 04/25/17 1057 04/25/17 1643 04/25/17 2009 04/25/17 2258 04/26/17 0311 04/26/17 1242 04/27/17 0327 04/28/17 0317  WBC 27.5*  --  26.0*  --   --   --   --   --   --  17.7*  --  17.6* 23.6*  CREATININE 0.74  --  0.81  --   --  0.84  --   --   --  0.87  --  0.57*  --   LATICACIDVEN  --    < >  --    < > 3.1*  --  2.5* 2.3* 2.6*  --  2.0*  --   --    < > = values in this interval not displayed.    Estimated Creatinine Clearance: 78.9 mL/min (A) (by C-G formula based on SCr of 0.57 mg/dL (L)).    No Known Allergies  Antimicrobials this admission: 12/22 vanc >>12/24 12/22 cefepime >> 12/22 Zithromax >>  Dose adjustments this admission:  Microbiology results: 12/21 BCx: NGTD 12/22 MRSA PCR: neg 12/23 Pleural Fluid: GPC 12/22 S Pneumo UA: positive   Thank you for allowing pharmacy to be a part of this patient's care.  Netta Cedars, PharmD, BCPS 04/28/2017 9:27 AM

## 2017-04-28 NOTE — Progress Notes (Signed)
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  Patient being currently managed for multiofocal pneumonia and left-sided pleural effusion.   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  Likely postobstructive pneumonia.  Possible contribution of COPD exacerbation also. Patient on/off BiPAP .Currently saturating 90-92% on nasal cannula . Pulmonary following.  Patient on broad-spectrum antibiotics with cefepime and Zithromax for pneumonia . Also on Xopenex,  Pulmicort and Atrovent. We will follow blood culture, sputum culture, Pleural fluid culture.So far negative. CT chest angio showed:  Diffuse airspace opacities within the left upper and lower lung lobes, and mild patchy airspace opacities within the right middle lobe, compatible with multifocal pneumonia. Central cavitary lung mass noted at the left lung, with air-fluid levels and nodular extension into the left lung apex. Associated large mass along the left side of the mediastinum, completely effacing the left mainstem bronchus and markedly compressing the left main pulmonary artery and its  branches.  Metastatic stage IV lung cancer:Recently started on palliative chemotherapy.Follows with Dr. [name].  The patient has a stage IV non-small cell lung cancer.  Patient has left upper lobe lung mass.  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 Was on palliative systemic chemotherapy with carboplatin, paclitaxel and Keytruda.  Left-sided pleural effusion: S/P [place] thoracocentesis with removal of 1.4 L of turbid brown fluid.  Gram stain shows moderate gram-positive cocci in pairs/chains.  We will follow final culture report Plan for Pleurx catheter placement.Cytology pending  Sepsis: Presented with elevated lactate and leukocytosis.  We will continue antibiotics.  Follow-up cultures.  Continue gentle IV fluids.  Elevated troponin: Likely secondary to supply demand ischemia.  Troponin level has normalized.  Denies any chest pain.  EKG did not show any ST changes suggestive of ischemia.  Sinus tachycardia: Much improved.Will most likely secondary to hypoxia.   Echocardiogram showed normal left ventricular function, normal ejection fraction, mild concentric hypertrophy  Hypokalemia: Resolved  Diarrhea: Resolved  Diabetes type 2: We will continue sliding scale insulin.  We will continue to monitor the blood glucose.  Hyperglycemia likely secondary to steroids.  Steroids have been discontinued.  Leukocytosis: Secondary to pneumonia and likely exacerbated by steroids.  Steroids have been discontinued.  We will continue to monitor the trend.  Moderate protein energy malnutrition: Nutrition following   Pressure injury: Continue supportive care.  Frequent turning   DVT prophylaxis:Lovenox Code Status: Full Family Communication: Updated the plan and current situation with the daughter Disposition Plan: Home   Consultants: Pulmonary and critical care  Procedures: Echocardiogram  Antimicrobials: Cefepime and  azithromycin started on 04/25/17   Subjective: Patient seen  and examined the bedside this morning.  Currently he is on nasal cannula.  However he feels better.  Sinus tachycardia has improved  Objective: Vitals:   04/28/17 0830 04/28/17 0900 04/28/17 1000 04/28/17 1100  BP:  135/64 138/63 139/71  Pulse:  (!) 108 (!) 105 (!) 103  Resp:  (!) 32 (!) 33 (!) 34  Temp:      TempSrc:      SpO2: 96% 90% (!) 88% 96%  Weight:      Height:        Intake/Output Summary (Last 24 hours) at 04/28/2017 1120 Last data filed at 04/28/2017 0800 Gross per 24 hour  Intake 2550 ml  Output 435 ml  Net 2115 ml   Filed Weights   04/25/17 1500 04/25/17 1534 04/28/17 0406  Weight: 69.9 kg (154 lb 1.6 oz) 69.9 kg (154 lb 1.6 oz) 72.1 kg (158 lb 15.2 oz)    Examination:  General exam: Generalized weakness, chronically ill,Not in distress, cachectic Respiratory system: Bilateral decreased air entry, more on the left  Cardiovascular system: S1 & S2 heard, RRR. No JVD, murmurs, rubs, gallops or clicks. No pedal edema. Gastrointestinal system: Abdomen is nondistended, soft and nontender. No organomegaly or masses felt. Normal bowel sounds heard. Central nervous system: Alert and oriented. No focal neurological deficits. Extremities: No edema, no clubbing ,no cyanosis, distal peripheral pulses palpable. Skin: Pallor,No cyanosis,,No Rash,No Ulcer Psychiatry: Judgement and insight appear normal. Mood & affect appropriate.    Data Reviewed: I have personally reviewed following labs and imaging studies  CBC: Recent Labs  Lab 04/22/17 1428 04/24/17 2054 04/25/17 0500 04/26/17 0311 04/27/17 0327 04/28/17 0317  WBC 13.1* 27.5* 26.0* 17.7* 17.6* 23.6*  NEUTROABS 12.3* 25.8*  --   --  16.7* 22.7*  HGB 14.4 16.4 14.4 13.2 11.8* 12.0*  HCT 44.6 48.5 43.6 41.5 36.9* 37.7*  MCV 91.5 93.3 93.4 93.9 93.4 93.8  PLT 207 242 250 226 211 563   Basic Metabolic Panel: Recent Labs  Lab 04/24/17 2054  04/25/17 0500 04/25/17 1057 04/26/17 0311 04/27/17 0327  NA 138 138 137 140 140  K 4.1 5.9* 4.1 4.5 4.3  CL 99* 96* 103 106 108  CO2 22 28 26 26 27   GLUCOSE 234* 311* 236* 242* 235*  BUN 26* 31* 33* 45* 36*  CREATININE 0.74 0.81 0.84 0.87 0.57*  CALCIUM 9.4 9.0 8.4* 8.6* 8.4*   GFR: Estimated Creatinine Clearance: 78.9 mL/min (A) (by C-G formula based on SCr of 0.57 mg/dL (L)). Liver Function Tests: Recent Labs  Lab 04/22/17 1428 04/24/17 2054 04/26/17 0311  AST 18 26 25   ALT 22 22 22   ALKPHOS 110 132* 79  BILITOT 0.58 0.9 0.7  PROT 6.6 7.4 5.7*  ALBUMIN 3.3* 3.6 2.5*   No results for input(s): LIPASE, AMYLASE in the last 168 hours. No results for input(s): AMMONIA in the last 168 hours. Coagulation Profile: No results for input(s): INR, PROTIME in the last 168 hours. Cardiac Enzymes: Recent Labs  Lab 04/25/17 0555 04/25/17 1056 04/25/17 2009  TROPONINI 0.24* 0.03* <0.03   BNP (last 3 results) No results for input(s): PROBNP in the last 8760 hours. HbA1C: No results for input(s): HGBA1C in the last 72 hours. CBG: Recent Labs  Lab 04/27/17 1311 04/27/17 1552 04/27/17 2124 04/28/17 0719 04/28/17 1102  GLUCAP 185* 184* 185* 229* 192*   Lipid Profile: No results for input(s): CHOL, HDL, LDLCALC, TRIG, CHOLHDL, LDLDIRECT in the last 72 hours. Thyroid Function Tests: No results for  input(s): TSH, T4TOTAL, FREET4, T3FREE, THYROIDAB in the last 72 hours. Anemia Panel: No results for input(s): VITAMINB12, FOLATE, FERRITIN, TIBC, IRON, RETICCTPCT in the last 72 hours. Sepsis Labs: Recent Labs  Lab 04/25/17 1643 04/25/17 2009 04/25/17 2258 04/26/17 1242  LATICACIDVEN 2.5* 2.3* 2.6* 2.0*    Recent Results (from the past 240 hour(s))  Culture, blood (routine x 2)     Status: None (Preliminary result)   Collection Time: 04/24/17 11:41 PM  Result Value Ref Range Status   Specimen Description BLOOD LEFT ANTECUBITAL  Final   Special Requests   Final     BOTTLES DRAWN AEROBIC AND ANAEROBIC Blood Culture results may not be optimal due to an inadequate volume of blood received in culture bottles   Culture   Final    NO GROWTH 2 DAYS Performed at [facility] [address], [place], [place] [address]    Report Status PENDING  Incomplete  MRSA PCR Screening     Status: None   Collection Time: 04/25/17  3:41 PM  Result Value Ref Range Status   MRSA by PCR NEGATIVE NEGATIVE Final    Comment:        The GeneXpert MRSA Assay (FDA approved for NASAL specimens only), is one component of a comprehensive MRSA colonization surveillance program. It is not intended to diagnose MRSA infection nor to guide or monitor treatment for MRSA infections.   Culture, blood (routine x 2)     Status: None (Preliminary result)   Collection Time: 04/25/17  4:43 PM  Result Value Ref Range Status   Specimen Description BLOOD RIGHT ANTECUBITAL  Final   Special Requests IN PEDIATRIC BOTTLE Blood Culture adequate volume  Final   Culture   Final    NO GROWTH 1 DAY Performed at [facility], [place] [address], [place], [place] [address]    Report Status PENDING  Incomplete  Body fluid culture     Status: None (Preliminary result)   Collection Time: 04/26/17 12:24 PM  Result Value Ref Range Status   Specimen Description PLEURAL LEFT  Final   Special Requests NONE  Final   Gram Stain   Final    ABUNDANT WBC PRESENT, PREDOMINANTLY PMN MODERATE GRAM POSITIVE COCCI IN PAIRS IN CHAINS    Culture   Final    CULTURE REINCUBATED FOR BETTER GROWTH Performed at [facility], [place] [address] [place], [place] [address]    Report Status PENDING  Incomplete         Radiology Studies: Dg Chest Port 1 View  Result Date: 04/26/2017 CLINICAL DATA:  Status post left thoracentesis today. EXAM: PORTABLE CHEST 1 VIEW COMPARISON:  PA and lateral chest 04/24/2017. FINDINGS: Left pleural effusion is decreased after thoracentesis. There is a left  pneumothorax estimated at 15%. Extensive opacity throughout the left long persists. The lungs are emphysematous. Cardiac silhouette is obscured. IMPRESSION: Left apical pneumothorax estimated at 15% after thoracentesis. Extensive airspace disease left lung base consistent with pneumonia. Emphysema. Electronically Signed   By: Inge Rise M.D.   On: 04/26/2017 12:35   US Thoracentesis Asp Pleural Space W/img Guide  Result Date: 04/26/2017 INDICATION: Lung cancer. Left-sided pleural effusion. Shortness of breath. Request diagnostic and therapeutic thoracentesis. EXAM: ULTRASOUND GUIDED LEFT THORACENTESIS MEDICATIONS: None. COMPLICATIONS: None immediate. PROCEDURE: An ultrasound guided thoracentesis was thoroughly discussed with the patient and questions answered. The benefits, risks, alternatives and complications were also discussed. The patient understands and wishes to proceed with the procedure.  Written consent was obtained. Ultrasound was performed to localize and mark an adequate pocket of fluid in the left chest. The area was then prepped and draped in the normal sterile fashion. 1% Lidocaine was used for local anesthesia. Under ultrasound guidance a Safe-T-Centesis catheter was introduced. Thoracentesis was performed. The catheter was removed and a dressing applied. FINDINGS: A total of approximately 1.4 L of turbid, dark yellow/brown fluid was removed. Samples were sent to the laboratory as requested by the clinical team. IMPRESSION: Successful ultrasound guided left thoracentesis yielding 1.4 L of pleural fluid. Read by: Ascencion Dike PA-C Electronically Signed   By: Aletta Edouard M.D.   On: 04/26/2017 12:24        Scheduled Meds: . budesonide (PULMICORT) nebulizer solution  0.25 mg Nebulization BID  . chlorhexidine  15 mL Mouth Rinse BID  . enoxaparin (LOVENOX) injection  40 mg Subcutaneous QHS  . feeding supplement (GLUCERNA SHAKE)  237 mL Oral TID BM  . insulin aspart  0-9 Units  Subcutaneous TID WC  . ipratropium  0.5 mg Nebulization Q6H  . levalbuterol  0.63 mg Nebulization Q6H  . mouth rinse  15 mL Mouth Rinse q12n4p   Continuous Infusions: . sodium chloride 75 mL/hr at 04/28/17 0800  . azithromycin Stopped (04/27/17 2242)  . ceFEPime (MAXIPIME) IV Stopped (04/28/17 0235)     LOS: 3 days    Time spent: 25 mins    Angeliah Wisdom Jodie Echevaria, MD [facility] Pager [phone]  If 7PM-7AM, please contact night-coverage [email or web address] Password [email or web address] 04/28/2017, 11:20 AM

## 2017-04-28 NOTE — Progress Notes (Signed)
The patient was non-compliant with Q2H turning throughout the day today. He continues to be non-compliant with turning this evening. Education provided on need and purpose for turning and mobility; Continued to refuse. Bed set to rotate. Will continue to provide education and encourage mobility.

## 2017-04-28 NOTE — Progress Notes (Signed)
Patient refused to be turned during shift. Patient did allow staff members to boost him up in bed, but declined full turns. Patient has a non-blanchable area on sacrum, foam pad applied. Patient educated on importance of off-loading weight, shift hips and turning in bed to prevent pressure ulcers from developing. Patient states understanding of pressure ulcer prevention and that he is high risk for development. Will continue to educate patient on the importance of turning.

## 2017-04-29 ENCOUNTER — Other Ambulatory Visit: Payer: TRICARE For Life (TFL)

## 2017-04-29 ENCOUNTER — Inpatient Hospital Stay (HOSPITAL_COMMUNITY): Payer: Medicare Other

## 2017-04-29 ENCOUNTER — Other Ambulatory Visit: Payer: Self-pay

## 2017-04-29 ENCOUNTER — Ambulatory Visit: Payer: Medicare Other

## 2017-04-29 ENCOUNTER — Telehealth: Payer: Self-pay | Admitting: Radiation Oncology

## 2017-04-29 DIAGNOSIS — I4891 Unspecified atrial fibrillation: Secondary | ICD-10-CM

## 2017-04-29 DIAGNOSIS — J869 Pyothorax without fistula: Secondary | ICD-10-CM

## 2017-04-29 LAB — CBC WITH DIFFERENTIAL/PLATELET
BASOS ABS: 0 10*3/uL (ref 0.0–0.1)
BASOS PCT: 0 %
EOS PCT: 0 %
Eosinophils Absolute: 0 10*3/uL (ref 0.0–0.7)
HCT: 39.7 % (ref 39.0–52.0)
Hemoglobin: 12.7 g/dL — ABNORMAL LOW (ref 13.0–17.0)
LYMPHS PCT: 4 %
Lymphs Abs: 1 10*3/uL (ref 0.7–4.0)
MCH: 30.2 pg (ref 26.0–34.0)
MCHC: 32 g/dL (ref 30.0–36.0)
MCV: 94.3 fL (ref 78.0–100.0)
Monocytes Absolute: 0.7 10*3/uL (ref 0.1–1.0)
Monocytes Relative: 2 %
NEUTROS ABS: 26.5 10*3/uL — AB (ref 1.7–7.7)
Neutrophils Relative %: 94 %
PLATELETS: 292 10*3/uL (ref 150–400)
RBC: 4.21 MIL/uL — AB (ref 4.22–5.81)
RDW: 14.5 % (ref 11.5–15.5)
WBC: 28.3 10*3/uL — AB (ref 4.0–10.5)

## 2017-04-29 LAB — GLUCOSE, CAPILLARY
GLUCOSE-CAPILLARY: 180 mg/dL — AB (ref 65–99)
GLUCOSE-CAPILLARY: 198 mg/dL — AB (ref 65–99)
GLUCOSE-CAPILLARY: 150 mg/dL — AB (ref 65–99)
Glucose-Capillary: 162 mg/dL — ABNORMAL HIGH (ref 65–99)

## 2017-04-29 LAB — LEGIONELLA PNEUMOPHILA SEROGP 1 UR AG: L. pneumophila Serogp 1 Ur Ag: NEGATIVE

## 2017-04-29 LAB — BASIC METABOLIC PANEL
ANION GAP: 7 (ref 5–15)
BUN: 51 mg/dL — ABNORMAL HIGH (ref 6–20)
CALCIUM: 8.4 mg/dL — AB (ref 8.9–10.3)
CO2: 22 mmol/L (ref 22–32)
Chloride: 112 mmol/L — ABNORMAL HIGH (ref 101–111)
Creatinine, Ser: 1 mg/dL (ref 0.61–1.24)
GLUCOSE: 216 mg/dL — AB (ref 65–99)
POTASSIUM: 4.8 mmol/L (ref 3.5–5.1)
Sodium: 141 mmol/L (ref 135–145)

## 2017-04-29 MED ORDER — LEVALBUTEROL HCL 0.63 MG/3ML IN NEBU
INHALATION_SOLUTION | RESPIRATORY_TRACT | Status: AC
  Administered 2017-04-29: 0.63 mg
  Filled 2017-04-29: qty 3

## 2017-04-29 MED ORDER — LORAZEPAM 0.5 MG PO TABS
0.5000 mg | ORAL_TABLET | Freq: Four times a day (QID) | ORAL | Status: DC | PRN
  Administered 2017-04-30 – 2017-05-05 (×8): 0.5 mg via ORAL
  Filled 2017-04-29 (×8): qty 1

## 2017-04-29 MED ORDER — AMIODARONE HCL IN DEXTROSE 360-4.14 MG/200ML-% IV SOLN
30.0000 mg/h | INTRAVENOUS | Status: DC
  Administered 2017-04-29: 30 mg/h via INTRAVENOUS
  Filled 2017-04-29: qty 200

## 2017-04-29 MED ORDER — AMIODARONE HCL IN DEXTROSE 360-4.14 MG/200ML-% IV SOLN
60.0000 mg/h | INTRAVENOUS | Status: AC
  Administered 2017-04-29 – 2017-04-30 (×2): 60 mg/h via INTRAVENOUS
  Filled 2017-04-29: qty 200

## 2017-04-29 MED ORDER — DILTIAZEM HCL-DEXTROSE 100-5 MG/100ML-% IV SOLN (PREMIX)
5.0000 mg/h | INTRAVENOUS | Status: DC
  Administered 2017-04-29: 5 mg/h via INTRAVENOUS
  Filled 2017-04-29: qty 100

## 2017-04-29 MED ORDER — DIGOXIN 0.25 MG/ML IJ SOLN
0.5000 mg | Freq: Once | INTRAMUSCULAR | Status: AC
  Administered 2017-04-29: 0.5 mg via INTRAVENOUS
  Filled 2017-04-29: qty 2

## 2017-04-29 MED ORDER — METOPROLOL TARTRATE 12.5 MG HALF TABLET: 12.5000 mg | ORAL_TABLET | Freq: Two times a day (BID) | ORAL | Status: DC

## 2017-04-29 MED ORDER — AMIODARONE HCL IN DEXTROSE 360-4.14 MG/200ML-% IV SOLN
60.0000 mg/h | INTRAVENOUS | Status: AC
  Administered 2017-04-29: 60 mg/h via INTRAVENOUS
  Filled 2017-04-29: qty 200

## 2017-04-29 MED ORDER — METOPROLOL TARTRATE 25 MG PO TABS
25.0000 mg | ORAL_TABLET | Freq: Two times a day (BID) | ORAL | Status: DC
  Administered 2017-04-29 – 2017-05-20 (×42): 25 mg via ORAL
  Filled 2017-04-29 (×43): qty 1

## 2017-04-29 MED ORDER — CEFTRIAXONE SODIUM 2 G IJ SOLR
2.0000 g | INTRAMUSCULAR | Status: DC
  Administered 2017-04-29 – 2017-05-20 (×22): 2 g via INTRAVENOUS
  Filled 2017-04-29 (×22): qty 2

## 2017-04-29 MED ORDER — METOPROLOL TARTRATE 5 MG/5ML IV SOLN
5.0000 mg | Freq: Three times a day (TID) | INTRAVENOUS | Status: DC | PRN
  Administered 2017-04-29 – 2017-05-07 (×2): 5 mg via INTRAVENOUS
  Filled 2017-04-29 (×2): qty 5

## 2017-04-29 MED ORDER — LEVALBUTEROL HCL 0.63 MG/3ML IN NEBU
0.6300 mg | INHALATION_SOLUTION | RESPIRATORY_TRACT | Status: DC | PRN
  Administered 2017-05-06: 0.63 mg via RESPIRATORY_TRACT
  Filled 2017-04-29: qty 3

## 2017-04-29 MED ORDER — INSULIN GLARGINE 100 UNIT/ML ~~LOC~~ SOLN
15.0000 [IU] | Freq: Every day | SUBCUTANEOUS | Status: DC
Start: 2017-04-29 — End: 2017-05-01
  Administered 2017-04-29 – 2017-04-30 (×2): 15 [IU] via SUBCUTANEOUS
  Filled 2017-04-29 (×3): qty 0.15

## 2017-04-29 MED ORDER — AMIODARONE HCL IN DEXTROSE 360-4.14 MG/200ML-% IV SOLN
30.0000 mg/h | INTRAVENOUS | Status: DC
  Administered 2017-04-30: 30 mg/h via INTRAVENOUS

## 2017-04-29 MED ORDER — LORAZEPAM 2 MG/ML IJ SOLN
0.5000 mg | Freq: Once | INTRAMUSCULAR | Status: AC
  Administered 2017-04-29: 0.5 mg via INTRAVENOUS
  Filled 2017-04-29: qty 1

## 2017-04-29 MED ORDER — DILTIAZEM HCL 25 MG/5ML IV SOLN
10.0000 mg | Freq: Once | INTRAVENOUS | Status: AC
  Administered 2017-04-29: 10 mg via INTRAVENOUS
  Filled 2017-04-29: qty 5

## 2017-04-29 MED ORDER — METOPROLOL TARTRATE 5 MG/5ML IV SOLN
INTRAVENOUS | Status: AC
  Administered 2017-04-29: 5 mg
  Filled 2017-04-29: qty 5

## 2017-04-29 NOTE — Progress Notes (Signed)
Pt. placed back on BiPAP by RN due to pt. unable to maintain oxygen saturations above 87%, @0350  pt. requested to be taken off BiPAP, RT gave pt. prn Xopenex due to >'d expiratory wheezing noted, placed on HFNC which was able to be weaned down to 4 lpm, RN made aware, RT to monitor.

## 2017-04-29 NOTE — Progress Notes (Signed)
Request received for left pigtail chest drain placement in patient; imaging studies were reviewed by Dr.Wagner; he recommends repeat left thoracentesis on 12/27 followed by CT chest with IV contrast prior to consideration of chest drain placement.Orders placed/d/w Dr. Halford Chessman.

## 2017-04-29 NOTE — Consult Note (Signed)
[facility]    Date of Admission:  04/24/2017           Day 5 cefepime        Day 5 of azithromycin       Reason for Consult: Probable postobstructive pneumonia with empyema    Referring Provider: Dr. Shelly Coss  Assessment: He probably has pneumococcal pneumonia with empyema.  Will change him to ceftriaxone pending final cultures and antibiotic susceptibilities.  He would probably benefit from more complete drainage of his pleural effusion but I am not sure that this is indicated given the palliative nature of his current care.  Plan: 1. 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 budesonide (PULMICORT) nebulizer solution  0.25 mg Nebulization BID  . chlorhexidine  15 mL Mouth Rinse BID  . enoxaparin (LOVENOX) injection  40 mg Subcutaneous QHS  . feeding supplement (GLUCERNA SHAKE)  237 mL Oral TID BM  . insulin aspart  0-9 Units Subcutaneous TID WC  . insulin glargine  15 Units Subcutaneous Daily  . ipratropium  0.5 mg Nebulization Q6H  . levalbuterol  0.63 mg Nebulization Q6H  . LORazepam  0.5 mg Intravenous Once  . mouth rinse  15 mL Mouth Rinse q12n4p  . metoprolol tartrate  25 mg Oral BID   Continuous Infusions: . sodium chloride 75 mL/hr at 04/28/17 2116  . azithromycin Stopped (04/28/17 2214)  . ceFEPime (MAXIPIME) IV Stopped (04/29/17 8527)  . diltiazem (CARDIZEM) infusion 5 mg/hr (04/29/17 1015)   PRN Meds:.acetaminophen **OR** acetaminophen, levalbuterol, loperamide, LORazepam, metoprolol tartrate, ondansetron **OR** ondansetron  (ZOFRAN) IV, prochlorperazine  HPI: Ronel Rodeheaver is a 76 y.o. 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  He was started on "palliative systemic chemotherapy" 2 weeks ago with carboplatin, paclitaxel and Keytruda.  He developed worsening cough and shortness of breath and was admitted on 04/25/2017.  CT scan revealed increase in his left perihilar mass with cavitation and air-fluid levels.  He had infiltrates in his left upper lobe, left lower lobe and right midlung fields.  He had a large, persistent right pleural effusion.  He was started on empiric vancomycin, cefepime and azithromycin.  Blood cultures are negative.  He underwent thoracentesis and 1.4 L of turbid, dark yellow/brown fluid was removed.  Pleural fluid protein was less than 3.  No LDH was performed.  Gram stain shows gram-positive cocci in chains.  It appears to be an alpha hemolytic strep.  Urinary pneumococcal antigen is positive.  He indicates that he is feeling a little bit better.  He denies current cough.   Review of Systems: Review of Systems  Unable to perform ROS: Medical condition  Constitutional:       Full review of systems is difficult because he is using his BiPAP mask and is short of breath.    Past Medical History:  Diagnosis Date  . Diabetes mellitus without complication ([place])   . Malignant neoplasm of unspecified part of unspecified bronchus or  lung ([place]) 03/30/2017    Social History   Tobacco Use  . Smoking status: Former Smoker    Years: 58.00    Types: Cigarettes    Last attempt to quit: 11/21/2016    Years since quitting: 0.4  . Smokeless tobacco: Never Used  Substance Use Topics  . Alcohol use: No    Frequency: Never  . Drug use: No    Family History  Problem Relation Age of Onset  . Cancer Mother    No Known Allergies  OBJECTIVE: Blood pressure 112/77, pulse (!) 167, temperature 97.6 F (36.4 C),  temperature source Axillary, resp. rate (!) 29, height 5\' 11"  (1.803 m), weight 161 lb 2.5 oz (73.1 kg), SpO2 96 %.  Physical Exam  Constitutional:  He appears malnourished.  He is very thin.  He is resting quietly in bed using his BiPAP mask.  Cardiovascular: Normal rate and regular rhythm.  No murmur heard. Very distant heart sounds.  Pulmonary/Chest:  He is tachypneic.  Coarse breath sounds bilaterally.  Abdominal: Soft. He exhibits no distension. There is no tenderness.    Lab Results Lab Results  Component Value Date   WBC 28.3 (H) 04/29/2017   HGB 12.7 (L) 04/29/2017   HCT 39.7 04/29/2017   MCV 94.3 04/29/2017   PLT 292 04/29/2017    Lab Results  Component Value Date   CREATININE 1.00 04/29/2017   BUN 51 (H) 04/29/2017   NA 141 04/29/2017   K 4.8 04/29/2017   CL 112 (H) 04/29/2017   CO2 22 04/29/2017    Lab Results  Component Value Date   ALT 22 04/26/2017   AST 25 04/26/2017   ALKPHOS 79 04/26/2017   BILITOT 0.7 04/26/2017     Microbiology: Recent Results (from the past 240 hour(s))  Culture, blood (routine x 2)     Status: None (Preliminary result)   Collection Time: 04/24/17 11:41 PM  Result Value Ref Range Status   Specimen Description BLOOD LEFT ANTECUBITAL  Final   Special Requests   Final    BOTTLES DRAWN AEROBIC AND ANAEROBIC Blood Culture results may not be optimal due to an inadequate volume of blood received in culture bottles   Culture   Final    NO GROWTH 3 DAYS Performed at [facility] [address] [place], [place] [address]    Report Status PENDING  Incomplete  MRSA PCR Screening     Status: None   Collection Time: 04/25/17  3:41 PM  Result Value Ref Range Status   MRSA by PCR NEGATIVE NEGATIVE Final    Comment:        The GeneXpert MRSA Assay (FDA approved for NASAL specimens only), is one component of a comprehensive MRSA colonization surveillance program. It is not intended to diagnose MRSA infection nor to guide  or monitor treatment for MRSA infections.   Culture, blood (routine x 2)     Status: None (Preliminary result)   Collection Time: 04/25/17  4:43 PM  Result Value Ref Range Status   Specimen Description BLOOD RIGHT ANTECUBITAL  Final   Special Requests IN PEDIATRIC BOTTLE Blood Culture adequate volume  Final   Culture   Final    NO GROWTH 2 DAYS Performed at [facility], [place] [address], [place],  [address]    Report Status PENDING  Incomplete  Body fluid culture     Status: None (Preliminary result)   Collection Time: 04/26/17 12:24 PM  Result Value Ref Range  Status   Specimen Description PLEURAL LEFT  Final   Special Requests NONE  Final   Gram Stain   Final    ABUNDANT WBC PRESENT, PREDOMINANTLY PMN MODERATE GRAM POSITIVE COCCI IN PAIRS IN CHAINS    Culture   Final    CULTURE REINCUBATED FOR BETTER GROWTH Performed at [facility], [address], [place], [place] [address]    Report Status PENDING  Incomplete    Michel Bickers, MD [facility] [phone] pager   [phone] cell 04/29/2017, 10:50 AM

## 2017-04-29 NOTE — Progress Notes (Signed)
Palliative Medicine consult noted. Due to high referral volume, there may be a delay seeing this patient. Please call the Palliative Medicine Team office at [phone] if recommendations are needed in the interim.  Thank you for inviting [place] to see this patient.  Marjie Skiff Josejuan Hoaglin, RN, BSN, [facility] 04/29/2017 2:09 PM Office [phone]

## 2017-04-29 NOTE — Progress Notes (Signed)
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  Patient being currently managed for multiofocal pneumonia and left-sided pleural effusion.   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  Likely postobstructive pneumonia.  Possible contribution of COPD exacerbation also. Patient on/off BiPAP .Put on Bipap this morning. Pulmonary was following.  Patient on broad-spectrum antibiotics with cefepime and Zithromax for pneumonia . Also on Xopenex,  Pulmicort and Atrovent. We will follow blood culture, sputum culture, Pleural fluid culture.So far negative. CT chest angio showed:  Diffuse airspace opacities within the left upper and lower lung lobes, and mild patchy airspace opacities within the right middle lobe, compatible with multifocal pneumonia. Central cavitary lung mass noted at the left lung, with air-fluid levels and nodular extension into the left lung apex. Associated large mass along the left side of the mediastinum, completely effacing the left mainstem bronchus and markedly compressing the left main pulmonary artery and its branches.  We  will repeat CXR today  Metastatic stage IV lung cancer:Recently started on palliative chemotherapy.Follows with Dr. [name].  The patient has a stage IV non-small cell lung cancer.  Patient has left upper lobe lung mass.  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 Was on palliative systemic chemotherapy with carboplatin, paclitaxel and Keytruda. Will inform Dr [name] about the presence of patient here.  Left-sided pleural effusion: S/P [place] thoracocentesis with removal of 1.4 L of turbid brown fluid.  Gram stain shows moderate gram-positive cocci in pairs/chains.  We will follow final culture report Plan for Pleurx catheter placement.Cytology pending  Sepsis: Presented with elevated lactate and leukocytosis.  We will continue antibiotics.  Follow-up cultures.  Continue gentle IV fluids. Has severe leukocytosis today.  Requested for infectious disease consultation  Elevated troponin: Likely secondary to supply demand ischemia.  Troponin level has normalized.  Denies any chest pain.  EKG did not show any ST changes suggestive of ischemia.  Afib with RVR: New finding as per the EKG done today. Started on Cardizem drip.  Will hold on starting on anti-coagulation because this could be transient A. fib triggered by hypoxia   Echocardiogram showed normal left ventricular function, normal ejection fraction, mild concentric hypertrophy  Hypokalemia: Resolved  Diarrhea: Resolved  Diabetes type 2: We will continue sliding scale insulin and lantus. We will continue to monitor the blood glucose.  Hyperglycemia likely secondary to steroids.  Steroids have been discontinued.  Leukocytosis: Secondary to pneumonia .  We will continue to monitor the trend.  Moderate protein energy malnutrition: Nutrition following   Pressure injury: Continue supportive care.  Frequent turning   DVT prophylaxis:Lovenox Code  Status: Full Family  Communication: Updated the plan and current situation with the daughter Disposition Plan: Home   Consultants: Pulmonary and critical care/ID  Procedures: Echocardiogram  Antimicrobials: Cefepime and azithromycin started on 04/25/17   Subjective: Patient seen and examined the patient this morning.  Became hypoxic  this morning and started on BiPAP. Heart rate found to be in the range of 160.  EKG showed atrial fibrillation with RVR.  Objective: Vitals:   04/29/17 0800 04/29/17 0817 04/29/17 0900 04/29/17 1000  BP: 139/76  (!) 120/95 103/85  Pulse: (!) 126 (!) 181 (!) 160 (!) 166  Resp: (!) 28 (!) 29 (!) 31 (!) 29  Temp: 97.6 F (36.4 C)     TempSrc: Axillary     SpO2: 92% 93% 96% 95%  Weight:      Height:        Intake/Output Summary (Last 24 hours) at 04/29/2017 1018 Last data filed at 04/29/2017 0800 Gross per 24 hour  Intake 2405 ml  Output 200 ml  Net 2205 ml   Filed Weights   04/25/17 1534 04/28/17 0406 04/29/17 0500  Weight: 69.9 kg (154 lb 1.6 oz) 72.1 kg (158 lb 15.2 oz) 73.1 kg (161 lb 2.5 oz)    Examination:  General exam: Chronically ill, in moderate respiratory distress cachectic, Respiratory system: Bilateral decreased air entry more on the left  cardiovascular system: A. fib with RVR. No JVD, murmurs, rubs, gallops or clicks. No pedal edema. Gastrointestinal system: Abdomen is nondistended, soft and nontender. No organomegaly or masses felt. Normal bowel sounds heard. Central nervous system: Alert and oriented. No focal neurological deficits. Extremities: No edema, no clubbing ,no cyanosis, distal peripheral pulses palpable. Skin: No cyanosis,No pallor,No Rash,No Ulcer Psychiatry: Judgement and insight appear normal. Mood & affect appropriate.   Data Reviewed: I have personally reviewed following labs and imaging studies  CBC: Recent Labs  Lab 04/22/17 1428  04/24/17 2054 04/25/17 0500 04/26/17 0311 04/27/17 0327 04/28/17 0317 04/29/17 0319    WBC 13.1*   < > 27.5* 26.0* 17.7* 17.6* 23.6* 28.3*  NEUTROABS 12.3*  --  25.8*  --   --  16.7* 22.7* 26.5*  HGB 14.4   < > 16.4 14.4 13.2 11.8* 12.0* 12.7*  HCT 44.6   < > 48.5 43.6 41.5 36.9* 37.7* 39.7  MCV 91.5   < > 93.3 93.4 93.9 93.4 93.8 94.3  PLT 207   < > 242 250 226 211 251 292   < > = values in this interval not displayed.   Basic Metabolic Panel: Recent Labs  Lab 04/25/17 0500 04/25/17 1057 04/26/17 0311 04/27/17 0327 04/29/17 0319  NA 138 137 140 140 141  K 5.9* 4.1 4.5 4.3 4.8  CL 96* 103 106 108 112*  CO2 28 26 26 27 22   GLUCOSE 311* 236* 242* 235* 216*  BUN 31* 33* 45* 36* 51*  CREATININE 0.81 0.84 0.87 0.57* 1.00  CALCIUM 9.0 8.4* 8.6* 8.4* 8.4*   GFR: Estimated Creatinine Clearance: 64 mL/min (by C-G formula based on SCr of 1 mg/dL). Liver Function Tests: Recent Labs  Lab 04/22/17 1428 04/24/17 2054 04/26/17 0311  AST 18 26 25   ALT 22 22 22   ALKPHOS 110 132* 79  BILITOT 0.58 0.9 0.7  PROT 6.6 7.4 5.7*  ALBUMIN 3.3* 3.6 2.5*   No results for input(s): LIPASE, AMYLASE in the last 168 hours. No results for input(s): AMMONIA in the last 168 hours. Coagulation Profile: No results for input(s): INR, PROTIME  in the last 168 hours. Cardiac Enzymes: Recent Labs  Lab 04/25/17 0555 04/25/17 1056 04/25/17 2009  TROPONINI 0.24* 0.03* <0.03   BNP (last 3 results) No results for input(s): PROBNP in the last 8760 hours. HbA1C: No results for input(s): HGBA1C in the last 72 hours. CBG: Recent Labs  Lab 04/28/17 0719 04/28/17 1102 04/28/17 1656 04/28/17 2110 04/29/17 0805  GLUCAP 229* 192* 180* 171* 180*   Lipid Profile: No results for input(s): CHOL, HDL, LDLCALC, TRIG, CHOLHDL, LDLDIRECT in the last 72 hours. Thyroid Function Tests: No results for input(s): TSH, T4TOTAL, FREET4, T3FREE, THYROIDAB in the last 72 hours. Anemia Panel: No results for input(s): VITAMINB12, FOLATE, FERRITIN, TIBC, IRON, RETICCTPCT in the last 72 hours. Sepsis  Labs: Recent Labs  Lab 04/25/17 1643 04/25/17 2009 04/25/17 2258 04/26/17 1242  LATICACIDVEN 2.5* 2.3* 2.6* 2.0*    Recent Results (from the past 240 hour(s))  Culture, blood (routine x 2)     Status: None (Preliminary result)   Collection Time: 04/24/17 11:41 PM  Result Value Ref Range Status   Specimen Description BLOOD LEFT ANTECUBITAL  Final   Special Requests   Final    BOTTLES DRAWN AEROBIC AND ANAEROBIC Blood Culture results may not be optimal due to an inadequate volume of blood received in culture bottles   Culture   Final    NO GROWTH 3 DAYS Performed at [facility] [address], [place], [place] [address]    Report Status PENDING  Incomplete  MRSA PCR Screening     Status: None   Collection Time: 04/25/17  3:41 PM  Result Value Ref Range Status   MRSA by PCR NEGATIVE NEGATIVE Final    Comment:        The GeneXpert MRSA Assay (FDA approved for NASAL specimens only), is one component of a comprehensive MRSA colonization surveillance program. It is not intended to diagnose MRSA infection nor to guide or monitor treatment for MRSA infections.   Culture, blood (routine x 2)     Status: None (Preliminary result)   Collection Time: 04/25/17  4:43 PM  Result Value Ref Range Status   Specimen Description BLOOD RIGHT ANTECUBITAL  Final   Special Requests IN PEDIATRIC BOTTLE Blood Culture adequate volume  Final   Culture   Final    NO GROWTH 2 DAYS Performed at [facility], [place] [address] [place], [place] [address]    Report Status PENDING  Incomplete  Body fluid culture     Status: None (Preliminary result)   Collection Time: 04/26/17 12:24 PM  Result Value Ref Range Status   Specimen Description PLEURAL LEFT  Final   Special Requests NONE  Final   Gram Stain   Final    ABUNDANT WBC PRESENT, PREDOMINANTLY PMN MODERATE GRAM POSITIVE COCCI IN PAIRS IN CHAINS    Culture   Final    CULTURE REINCUBATED FOR BETTER GROWTH Performed at [facility], [place] [address], [place], [place] [address]    Report Status PENDING  Incomplete         Radiology Studies: No results found.      Scheduled Meds: . budesonide (PULMICORT) nebulizer solution  0.25 mg Nebulization BID  . chlorhexidine  15 mL Mouth Rinse BID  . enoxaparin (LOVENOX) injection  40 mg Subcutaneous QHS  . feeding supplement (GLUCERNA SHAKE)  237 mL Oral TID BM  . insulin aspart  0-9 Units Subcutaneous TID WC  . insulin glargine  15  Units Subcutaneous Daily  . ipratropium  0.5 mg Nebulization Q6H  . levalbuterol  0.63 mg Nebulization Q6H  . mouth rinse  15 mL Mouth Rinse q12n4p  . metoprolol tartrate  25 mg Oral BID   Continuous Infusions: . sodium chloride 75 mL/hr at 04/28/17 2116  . azithromycin Stopped (04/28/17 2214)  . ceFEPime (MAXIPIME) IV Stopped (04/29/17 6962)  . diltiazem (CARDIZEM) infusion 5 mg/hr (04/29/17 1015)     LOS: 4 days    Time spent: 25 mins    Shabrea Weldin Jodie Echevaria, MD [facility] Pager [phone]  If 7PM-7AM, please contact night-coverage [email or web address] Password [email or web address] 04/29/2017, 10:18 AM

## 2017-04-29 NOTE — Progress Notes (Signed)
[facility] PCCM PROGRESS NOTE  Date of Admission: 04/24/2017 Date of Consult: 04/25/2017 Referring Provider: Dr. Hal Hope, [facility] Chief Complaint: Short of breath  HPI: 76 yo male former smoker presented with dyspnea, hypoxia, and productive cough.  He has hx of Stage IV NSCLC (squamous cell) and started chemotherapy [place], carboplatin, paclitaxel) 3 weeks prior to admission.  He was found to have multifocal pneumonia and large Lt pleural effusion.  Past Medical History: He  has a past medical history of Diabetes mellitus without complication ([place]) and Malignant neoplasm of unspecified part of unspecified bronchus or lung ([place]) (03/30/2017).  Subjective: Still requiring Bipap.    Vital signs: BP 91/63   Pulse (!) 147   Temp 98 F (36.7 C) (Axillary)   Resp (!) 28   Ht 5\' 11"  (1.803 m)   Wt 161 lb 2.5 oz (73.1 kg)   SpO2 95%   BMI 22.48 kg/m   Intake/Output: I/O last 3 completed shifts: In: 2202 [P.O.:440; I.V.:2400; IV Piggyback:700] Out: 55 [Urine:510]  Physical Exam:  General - cachectic, using accessory muscles Eyes - pupils reactive ENT - no sinus tenderness, no oral exudate, no LAN Cardiac - irregular, tachycardic, no murmur Chest - decreased BS on Lt, scattered rhonchi Abd - soft, non tender Ext - decreased muscle bulk, no edema Skin - no rashes Neuro - follows commands  Discussion: 76 yo male with pneumococcal pneumonia and empyema leading to acute hypoxic/hypercapnic respiratory failure in setting of Stage IV NSCLC.  Assessment/Plan:  Acute respiratory failure with hypoxia, hypercapnia. - oxygen to keep SpO2 90 to 95% - Bipap prn  Sepsis from multifocal pneumonia with empyema. - Abx per ID - will ask ID to assess for pig tail catheter insertion to continue drainage of pleural space - he is not a candidate for surgical intervention of pleural space  Emphysema with hx of tobacco abuse. - continue scheduled BDs  A fib with RVR. - per cardiology  and primary team  Stage 4 NSCLC. - agree with plan for palliative care consultation  DVT prophylaxis: Lovenox SUP: Not indicated Diet: Carb modified, heart healthy Goals of care: Full code.  Palliative care consulted.  Chesley Mires, MD [facility] 04/29/2017, 2:54 PM Pager:  [phone] After 3pm call: [phone]  FLOW SHEET  Cultures: Blood 12/21 >>  Blood 12/22 >>  Pneumococcal Ag 12/22 >> positive Legionella Ag 12/22 >> negative Lt pleural fluid >> GPC in chains  Antibiotics: Vancomycin 12/21 >> 12/24  Cefepime 12/21 >> 12/25 Zithromax 12/21 >> 12/25 Rocephin 12/26 >>  Studies: CT angio chest 12/21 >> Lt mediastinal mass with compression of Lt main bronchus and Lt PA, large Lt effusion, ASD on Lt, patchy ASD on Rt, emphysema Echo 12/22 >> EF 60 to 65%, PAS 62 mmHg Lt thoracentesis 12/23 >> 1.4 liters, glucose < 20, protein < 3, WBC 78,475 (93%N)   Events: 12/22 Admit  Lines/tubes:  Consults: 12/26 ID 12/26 Oncology 12/26 Cardiology 12/26 Palliative care  Resolved Problems:  Labs: CMP Latest Ref Rng & Units 04/29/2017 04/27/2017 04/26/2017  Glucose 65 - 99 mg/dL 216(H) 235(H) 242(H)  BUN 6 - 20 mg/dL 51(H) 36(H) 45(H)  Creatinine 0.61 - 1.24 mg/dL 1.00 0.57(L) 0.87  Sodium 135 - 145 mmol/L 141 140 140  Potassium 3.5 - 5.1 mmol/L 4.8 4.3 4.5  Chloride 101 - 111 mmol/L 112(H) 108 106  CO2 22 - 32 mmol/L 22 27 26   Calcium 8.9 - 10.3 mg/dL 8.4(L) 8.4(L) 8.6(L)  Total Protein 6.5 - 8.1 g/dL - -  5.7(L)  Total Bilirubin 0.3 - 1.2 mg/dL - - 0.7  Alkaline Phos 38 - 126 U/L - - 79  AST 15 - 41 U/L - - 25  ALT 17 - 63 U/L - - 22    CBC Latest Ref Rng & Units 04/29/2017 04/28/2017 04/27/2017  WBC 4.0 - 10.5 K/uL 28.3(H) 23.6(H) 17.6(H)  Hemoglobin 13.0 - 17.0 g/dL 12.7(L) 12.0(L) 11.8(L)  Hematocrit 39.0 - 52.0 % 39.7 37.7(L) 36.9(L)  Platelets 150 - 400 K/uL 292 251 211    CBG (last 3)  Recent Labs    04/28/17 2110 04/29/17 0805  04/29/17 1124  GLUCAP 171* 180* 162*    Imaging: No results found.

## 2017-04-29 NOTE — Progress Notes (Signed)
Pt has more fluid and will be getting a pigtail drain. RT removed the BIPAP and placed him on 6L [place]. The Pt's SAT was 98%. The Pt was able to talk and joke around again. RT will continue to monitor

## 2017-04-29 NOTE — Progress Notes (Signed)
HR130-`140'S, amio  Gtt, was  On  Board to be  Stopped,    Continue amio  Gtt  D/w ICU nurse

## 2017-04-29 NOTE — Telephone Encounter (Signed)
LM for the patient and his wife to make sure they knew that we were following along with his careteam regarding his course. He was unstable to leave the floor today for treatment, and is being treated for Sepsis from pneumonia/empyema. We will consider resuming treatment tomorrow, and I asked them to call if they have questions.     Carola Rhine, PAC

## 2017-04-29 NOTE — Progress Notes (Signed)
Pts HR 135-140 BPM. Notified on call Cardiologist. Orders given to increase Amiodarone gtt to from 30 mg/ hr to 60 mg/ hr. RN will continue to monitor.

## 2017-04-29 NOTE — Consult Note (Signed)
Cardiology Consultation:   Patient ID: Maurice Tyler; [id number]; 1940/09/06   Admit date: 04/24/2017 Date of Consult: 04/29/2017  Primary Care Provider: [name], MD Primary Cardiologist: New- Dr. Percival Spanish   Patient Profile:   Maurice Tyler is a 76 y.o. 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 Tawanna Solo.  History of Present Illness:   Mr. Marano was admitted on 04/24/2017 for acute respiratory failure with hypoxia and hypercarbia, likely multifocal pneumonia and large pleural effusion on the left. Likely postobstructive pneumonia dn possible COPD exacerbation. Was on BiPap, weaned off, back on BiPap this am.   He developed atrial fibrillation with RVR this am. On monitor he was in sinus tach in the 120's, then developed sudden SVT up to 200 bpm. He was given metoprolol 5 mg IV and Diltiazem 10 mg IV bolus followed by a drip. The heart rate slowed and was noted to be in atrial fib in the 160's-170's.   He is being started on metoprolol 25 mg bid, not given yet. Anticoagulation has not been begun as this episode may have been triggered by hypoxia.   The patient is currently alert with eyes open, squirming in bed but does not make eye contact or answer any of my questions. Of note, he has significantly different pupil size. He seems to be moving all of his extremities. The nurse says that he was alert and communicative this morning. She is trying to reach his family.  I do not see any cardiac history in Epic. An echocardiogram was done on 04/25/17 that showed normal LV systolic function with EF 60-65%, could not evaluate diastolic function. Left atrium was normal size.     Past Medical History:  Diagnosis Date  . Diabetes mellitus without complication ([place])   . Malignant neoplasm of  unspecified part of unspecified bronchus or lung ([place]) 03/30/2017    Past Surgical History:  Procedure Laterality Date  . IR THORACENTESIS ASP PLEURAL SPACE W/IMG GUIDE  03/16/2017  . VIDEO BRONCHOSCOPY Bilateral 03/25/2017   Procedure: VIDEO BRONCHOSCOPY WITH FLUORO;  Surgeon: Juanito Doom, MD;  Location: [facility];  Service: Cardiopulmonary;  Laterality: Bilateral;     Home Medications:  Prior to Admission medications   Medication Sig Start Date End Date Taking? Authorizing Provider  acetaminophen (TYLENOL) 500 MG tablet Take 1,000 mg by mouth every 6 (six) hours as needed for moderate pain.   Yes [provider]  albuterol (PROVENTIL HFA;VENTOLIN HFA) 108 (90 Base) MCG/ACT inhaler Inhale 1 puff every 6 (six) hours as needed into the lungs for wheezing or shortness of breath.   Yes [provider]  loperamide (IMODIUM A-D) 2 MG tablet Take 2 mg by mouth 3 (three) times daily as needed for diarrhea or loose stools.   Yes [provider]  metFORMIN (GLUCOPHAGE) 500 MG tablet Take 500 mg 2 (two) times daily with a meal by mouth.    Yes [provider]  methylPREDNISolone (MEDROL DOSEPAK) 4 MG TBPK tablet Take per package instructions Patient taking differently: as directed. Take 6 tablets on Day 1, Take 5 tablets on Day 2, Take 4 tablets on Day 3, Take 3 tablets on Day 4, Take 2 tablets on Day 5 and take 1 tablet on Day 6. 04/24/17  Yes Curt Bears, MD  prochlorperazine (COMPAZINE) 10 MG  tablet Take 1 tablet (10 mg total) by mouth every 6 (six) hours as needed for nausea or vomiting. 04/24/17  Yes Kyung Rudd, MD  ACCU-CHEK FASTCLIX LANCETS [place]  03/19/17   [provider]  ACCU-CHEK GUIDE test strip USE TO CHECK BLOOD SUGAR BID 03/23/17   [provider]  cetirizine (ZYRTEC) 10 MG tablet Take 10 mg daily by mouth. ALLERTEC    [provider]  nystatin (MYCOSTATIN) 100000 UNIT/ML suspension SWISH AND SWALLOW 5 MLS PO   AC AND QHS FOR 7 DAYS 03/31/17   [provider]    Inpatient Medications: Scheduled Meds: . budesonide (PULMICORT) nebulizer solution  0.25 mg Nebulization BID  . chlorhexidine  15 mL Mouth Rinse BID  . digoxin  0.5 mg Intravenous Once  . enoxaparin (LOVENOX) injection  40 mg Subcutaneous QHS  . feeding supplement (GLUCERNA SHAKE)  237 mL Oral TID BM  . insulin aspart  0-9 Units Subcutaneous TID WC  . insulin glargine  15 Units Subcutaneous Daily  . ipratropium  0.5 mg Nebulization Q6H  . levalbuterol  0.63 mg Nebulization Q6H  . mouth rinse  15 mL Mouth Rinse q12n4p  . metoprolol tartrate  25 mg Oral BID   Continuous Infusions: . sodium chloride 75 mL/hr at 04/28/17 2116  . cefTRIAXone (ROCEPHIN)  IV 2 g (04/29/17 1137)  . diltiazem (CARDIZEM) infusion 15 mg/hr (04/29/17 1115)   PRN Meds: acetaminophen **OR** acetaminophen, levalbuterol, loperamide, LORazepam, metoprolol tartrate, ondansetron **OR** ondansetron (ZOFRAN) IV, prochlorperazine  Allergies:   No Known Allergies  Social History:   Social History   Socioeconomic History  . Marital status: Married    Spouse name: Not on file  . Number of children: Not on file  . Years of education: Not on file  . Highest education level: Not on file  Social Needs  . Financial resource strain: Not on file  . Food insecurity - worry: Not on file  . Food insecurity - inability: Not on file  . Transportation needs - medical: Not on file  . Transportation needs - non-medical: Not on file  Occupational History  . Not on file  Tobacco Use  . Smoking status: Former Smoker    Years: 58.00    Types: Cigarettes    Last attempt to quit: 11/21/2016    Years since quitting: 0.4  . Smokeless tobacco: Never Used  Substance and Sexual Activity  . Alcohol use: No    Frequency: Never  . Drug use: No  . Sexual activity: Not on file  Other Topics Concern  . Not on file  Social History Narrative  . Not on file    Family  History:    Family History  Problem Relation Age of Onset  . Cancer Mother      ROS:  Please see the history of present illness.  ROS  All other ROS reviewed and negative.     Physical Exam/Data:   Vitals:   04/29/17 0900 04/29/17 1000 04/29/17 1018 04/29/17 1134  BP: (!) 120/95 103/85 112/77   Pulse: (!) 160 (!) 166 (!) 167   Resp: (!) 31 (!) 29 (!) 29   Temp:    98 F (36.7 C)  TempSrc:    Axillary  SpO2: 96% 95% 96%   Weight:      Height:        Intake/Output Summary (Last 24 hours) at 04/29/2017 1229 Last data filed at 04/29/2017 0800 Gross per 24 hour  Intake 2405 ml  Output 200 ml  Net 2205 ml   Filed Weights   04/25/17 1534 04/28/17 0406 04/29/17 0500  Weight: 154 lb 1.6 oz (69.9 kg) 158 lb 15.2 oz (72.1 kg) 161 lb 2.5 oz (73.1 kg)   Body mass index is 22.48 kg/m.  General:  Thin, frail, elderly male, on BiPap HEENT: normal except unequal pupils Neck: no JVD Vascular: No carotid bruits; FA pulses 2+  Cardiac:  normal S1, S2; irregularly irregular, tachycardic; no murmur  Lungs:  clear to auscultation bilaterally, no wheezing, rhonchi or rales  Abd: soft, nontender, no hepatomegaly  Ext: Soft pedal/ankle edema Musculoskeletal:  No deformities, Skin: warm and dry  Neuro:  Pt responds only to few simple yes/no questions Psych:  Unable to assess  EKG:  The EKG was personally reviewed and demonstrates:  Atrial fibrillation with rapid ventricular response, 165 bpm, Low voltage QRS Inferior infarct , age undetermined Telemetry:  Telemetry was personally reviewed and demonstrates:  ST 120's--> SVT 200--> afib 160's-170's  Relevant CV Studies:  Echocardiogram 04/25/2017 Study Conclusions  - Left ventricle: The cavity size was normal. There was mild   concentric hypertrophy. Systolic function was normal. The   estimated ejection fraction was in the range of 60% to 65%. Wall   motion was normal; there were no regional wall motion   abnormalities. The  study is not technically sufficient to allow   evaluation of LV diastolic function. - Ventricular septum: The contour showed diastolic flattening. - Mitral valve: Calcified annulus. Mildly thickened leaflets . - Right ventricle: The cavity size was moderately dilated. Wall   thickness was normal. - Right atrium: The atrium was mildly dilated. - Pulmonary arteries: Systolic pressure was moderately to severely   increased. PA peak pressure: 62 mm Hg (S).   Laboratory Data:  Chemistry Recent Labs  Lab 04/26/17 0311 04/27/17 0327 04/29/17 0319  NA 140 140 141  K 4.5 4.3 4.8  CL 106 108 112*  CO2 26 27 22   GLUCOSE 242* 235* 216*  BUN 45* 36* 51*  CREATININE 0.87 0.57* 1.00  CALCIUM 8.6* 8.4* 8.4*  GFRNONAA >60 >60 >60  GFRAA >60 >60 >60  ANIONGAP 8 5 7     Recent Labs  Lab 04/22/17 1428 04/24/17 2054 04/26/17 0311  PROT 6.6 7.4 5.7*  ALBUMIN 3.3* 3.6 2.5*  AST 18 26 25   ALT 22 22 22   ALKPHOS 110 132* 79  BILITOT 0.58 0.9 0.7   Hematology Recent Labs  Lab 04/27/17 0327 04/28/17 0317 04/29/17 0319  WBC 17.6* 23.6* 28.3*  RBC 3.95* 4.02* 4.21*  HGB 11.8* 12.0* 12.7*  HCT 36.9* 37.7* 39.7  MCV 93.4 93.8 94.3  MCH 29.9 29.9 30.2  MCHC 32.0 31.8 32.0  RDW 13.7 14.0 14.5  PLT 211 251 292   Cardiac Enzymes Recent Labs  Lab 04/25/17 0555 04/25/17 1056 04/25/17 2009  TROPONINI 0.24* 0.03* <0.03    Recent Labs  Lab 04/24/17 2238 04/25/17 0826  TROPIPOC 0.05 0.04    BNP Recent Labs  Lab 04/24/17 2227  BNP 440.6*    DDimer No results for input(s): DDIMER in the last 168 hours.  Radiology/Studies:  Dg Chest Port 1 View  Result Date: 04/26/2017 CLINICAL DATA:  Status post left thoracentesis today. EXAM: PORTABLE CHEST 1 VIEW COMPARISON:  PA and lateral chest 04/24/2017. FINDINGS: Left pleural effusion is decreased after thoracentesis. There is a left pneumothorax estimated at 15%. Extensive opacity throughout the left long persists. The lungs are  emphysematous. Cardiac silhouette is  obscured. IMPRESSION: Left apical pneumothorax estimated at 15% after thoracentesis. Extensive airspace disease left lung base consistent with pneumonia. Emphysema. Electronically Signed   By: Inge Rise M.D.   On: 04/26/2017 12:35   US Thoracentesis Asp Pleural Space W/img Guide  Result Date: 04/26/2017 INDICATION: Lung cancer. Left-sided pleural effusion. Shortness of breath. Request diagnostic and therapeutic thoracentesis. EXAM: ULTRASOUND GUIDED LEFT THORACENTESIS MEDICATIONS: None. COMPLICATIONS: None immediate. PROCEDURE: An ultrasound guided thoracentesis was thoroughly discussed with the patient and questions answered. The benefits, risks, alternatives and complications were also discussed. The patient understands and wishes to proceed with the procedure. Written consent was obtained. Ultrasound was performed to localize and mark an adequate pocket of fluid in the left chest. The area was then prepped and draped in the normal sterile fashion. 1% Lidocaine was used for local anesthesia. Under ultrasound guidance a Safe-T-Centesis catheter was introduced. Thoracentesis was performed. The catheter was removed and a dressing applied. FINDINGS: A total of approximately 1.4 L of turbid, dark yellow/brown fluid was removed. Samples were sent to the laboratory as requested by the clinical team. IMPRESSION: Successful ultrasound guided left thoracentesis yielding 1.4 L of pleural fluid. Read by: Ascencion Dike PA-C Electronically Signed   By: Aletta Edouard M.D.   On: 04/26/2017 12:24    Assessment and Plan:   Atrial fibrillation with RVR -Pt was in sinus tach in the 120's, developed SVT at 200 this am, now on cardizem drip and in afib with rates in 160's-170's. BP is soft. BB started.  -Pt admitted with acute respiratory failure in setting of stage IV lung cancer and COPD. Currently on BiPap. -Afib may be related to hypoxic episode -Echo this admission showed  normal LV function and normal LA size -electrolytes and kidney function normal by labs.  -Will start amiodarone and stop cardizem due to soft BP. He may convert if rate slows.  -No anticoagulation at presert as pt is not a good candidate.   Metastatic stage IV lung cancer -Recently started on palliative chemotherapy, followed by Dr. [name]. 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  -Was on Palliative chemo with carboplatin, paclitaxel and Keytruda.  Left sided pleural effusion -S/P US thoracentesis with removal of 1.4L of turbid brown fluid.   Sepsis -presented with elevated lactate and leukocytes. Culture collected and on antibiotics per IM. ID consulted.  -WBCs climbing, 28.3 today   For questions or updates, please contact [place] Please consult [email or web address] for contact info under Cardiology/STEMI.   Signed, Daune Perch, NP  04/29/2017 12:29 PM   History and all data above reviewed.  Patient examined.  I agree with the findings as above. Patient with metastatic squamous cell CA being treated for pneumonia, sepsis and probable empyema went into rapid rhythm this morning.   Initially it was regular but quickly demonstrated atrial fib.  His BP is lower with this than previous.  He is on BiPap and unable to answer questions other than with yes/no answers.  The patient denies any chest pain.  Unable to assess whether he feels fibrillation. [id number]  ,  Lungs: Decreased breath sounds  ,  Abd: Positive bowel sounds, no rebound no guarding, Ext No edema, muscle wasting.   .  All available labs, radiology testing, previous records reviewed. Agree with documented assessment and plan. Atrial fib:  Acute onset.  BP is soft in this rhythm and we will not be able to titrate this.  I suspect  that his BP will improve if we can maintain at least a slower rate if not sinus rhythm.  We can switch to amiodarone IV.  I  would not suggest systemic anticoagulation at this point with his significant comorbid diseases and the fact that this is new.  Overall goals of treatment to be addressed per primary team.  ELEVATED TROPONIN:  This was noted and thought 3 days ago to be secondary to his acute illness.  EF was normal.   No evidence of active ischemia.     Jeneen Rinks Adelyne Marchese  1:39 PM  04/29/2017

## 2017-04-30 ENCOUNTER — Inpatient Hospital Stay (HOSPITAL_COMMUNITY): Payer: Medicare Other

## 2017-04-30 ENCOUNTER — Ambulatory Visit: Payer: Medicare Other

## 2017-04-30 ENCOUNTER — Encounter: Payer: TRICARE For Life (TFL) | Admitting: Cardiothoracic Surgery

## 2017-04-30 DIAGNOSIS — J154 Pneumonia due to other streptococci: Secondary | ICD-10-CM

## 2017-04-30 DIAGNOSIS — C3492 Malignant neoplasm of unspecified part of left bronchus or lung: Secondary | ICD-10-CM

## 2017-04-30 DIAGNOSIS — Z7189 Other specified counseling: Secondary | ICD-10-CM

## 2017-04-30 DIAGNOSIS — I482 Chronic atrial fibrillation, unspecified: Secondary | ICD-10-CM

## 2017-04-30 DIAGNOSIS — N179 Acute kidney failure, unspecified: Secondary | ICD-10-CM

## 2017-04-30 DIAGNOSIS — E440 Moderate protein-calorie malnutrition: Secondary | ICD-10-CM

## 2017-04-30 DIAGNOSIS — Z515 Encounter for palliative care: Secondary | ICD-10-CM

## 2017-04-30 LAB — CBC WITH DIFFERENTIAL/PLATELET
BASOS PCT: 0 %
Basophils Absolute: 0 10*3/uL (ref 0.0–0.1)
EOS ABS: 0 10*3/uL (ref 0.0–0.7)
Eosinophils Relative: 0 %
HCT: 40.8 % (ref 39.0–52.0)
Hemoglobin: 13.3 g/dL (ref 13.0–17.0)
LYMPHS ABS: 0.9 10*3/uL (ref 0.7–4.0)
Lymphocytes Relative: 3 %
MCH: 30.7 pg (ref 26.0–34.0)
MCHC: 32.6 g/dL (ref 30.0–36.0)
MCV: 94.2 fL (ref 78.0–100.0)
MONO ABS: 0.9 10*3/uL (ref 0.1–1.0)
Monocytes Relative: 3 %
NEUTROS ABS: 26.8 10*3/uL — AB (ref 1.7–7.7)
NEUTROS PCT: 94 %
PLATELETS: 311 10*3/uL (ref 150–400)
RBC: 4.33 MIL/uL (ref 4.22–5.81)
RDW: 14.3 % (ref 11.5–15.5)
WBC: 28.6 10*3/uL — ABNORMAL HIGH (ref 4.0–10.5)

## 2017-04-30 LAB — GLUCOSE, CAPILLARY
GLUCOSE-CAPILLARY: 121 mg/dL — AB (ref 65–99)
Glucose-Capillary: 144 mg/dL — ABNORMAL HIGH (ref 65–99)
Glucose-Capillary: 144 mg/dL — ABNORMAL HIGH (ref 65–99)
Glucose-Capillary: 100 mg/dL — ABNORMAL HIGH (ref 65–99)

## 2017-04-30 LAB — CULTURE, BLOOD (ROUTINE X 2): CULTURE: NO GROWTH

## 2017-04-30 LAB — BASIC METABOLIC PANEL
Anion gap: 7 (ref 5–15)
BUN: 63 mg/dL — AB (ref 6–20)
CO2: 21 mmol/L — ABNORMAL LOW (ref 22–32)
CREATININE: 1.79 mg/dL — AB (ref 0.61–1.24)
Calcium: 8.3 mg/dL — ABNORMAL LOW (ref 8.9–10.3)
Chloride: 113 mmol/L — ABNORMAL HIGH (ref 101–111)
GFR calc Af Amer: 40 mL/min — ABNORMAL LOW (ref >60)
GFR, EST NON AFRICAN AMERICAN: 35 mL/min — AB (ref >60)
GLUCOSE: 176 mg/dL — AB (ref 65–99)
Potassium: 5 mmol/L (ref 3.5–5.1)
SODIUM: 141 mmol/L (ref 135–145)

## 2017-04-30 LAB — GRAM STAIN

## 2017-04-30 MED ORDER — IOPAMIDOL (ISOVUE-300) INJECTION 61%
INTRAVENOUS | Status: AC
  Filled 2017-04-30: qty 75

## 2017-04-30 MED ORDER — IOPAMIDOL (ISOVUE-300) INJECTION 61%
75.0000 mL | Freq: Once | INTRAVENOUS | Status: AC | PRN
  Administered 2017-04-30: 60 mL via INTRAVENOUS

## 2017-04-30 MED ORDER — AMIODARONE HCL 200 MG PO TABS
400.0000 mg | ORAL_TABLET | Freq: Two times a day (BID) | ORAL | Status: DC
  Administered 2017-04-30 – 2017-05-20 (×41): 400 mg via ORAL
  Filled 2017-04-30 (×41): qty 2

## 2017-04-30 MED ORDER — SODIUM CHLORIDE 0.9 % IV BOLUS (SEPSIS)
500.0000 mL | Freq: Once | INTRAVENOUS | Status: AC
  Administered 2017-04-30: 500 mL via INTRAVENOUS

## 2017-04-30 NOTE — Progress Notes (Signed)
Progress Note  Patient Name: Maurice Tyler Date of Encounter: 04/30/2017  Primary Cardiologist: Minus Breeding, MD   Subjective   Pt is off BiPap, on nasal cannula. Looks much better today. States he is breathing better, no chest pain. He does not remember yesterday.   Inpatient Medications    Scheduled Meds: . budesonide (PULMICORT) nebulizer solution  0.25 mg Nebulization BID  . chlorhexidine  15 mL Mouth Rinse BID  . enoxaparin (LOVENOX) injection  40 mg Subcutaneous QHS  . feeding supplement (GLUCERNA SHAKE)  237 mL Oral TID BM  . insulin aspart  0-9 Units Subcutaneous TID WC  . insulin glargine  15 Units Subcutaneous Daily  . ipratropium  0.5 mg Nebulization Q6H  . levalbuterol  0.63 mg Nebulization Q6H  . mouth rinse  15 mL Mouth Rinse q12n4p  . metoprolol tartrate  25 mg Oral BID   Continuous Infusions: . sodium chloride 100 mL/hr at 04/30/17 0810  . amiodarone Stopped (04/29/17 2313)  . amiodarone 30 mg/hr (04/30/17 0515)  . cefTRIAXone (ROCEPHIN)  IV Stopped (04/29/17 1207)   PRN Meds: acetaminophen **OR** acetaminophen, levalbuterol, loperamide, LORazepam, metoprolol tartrate, ondansetron **OR** ondansetron (ZOFRAN) IV, prochlorperazine   Vital Signs    Vitals:   04/30/17 0335 04/30/17 0400 04/30/17 0500 04/30/17 0700  BP:  104/62 130/66 (!) 105/46  Pulse:  (!) 115 91 89  Resp:  (!) 23 (!) 27 (!) 26  Temp: 97.9 F (36.6 C)   98.4 F (36.9 C)  TempSrc: Axillary     SpO2:  95% 94% 96%  Weight:      Height:        Intake/Output Summary (Last 24 hours) at 04/30/2017 0854 Last data filed at 04/30/2017 0528 Gross per 24 hour  Intake 1536.14 ml  Output 950 ml  Net 586.14 ml   Filed Weights   04/28/17 0406 04/29/17 0500 04/30/17 0208  Weight: 158 lb 15.2 oz (72.1 kg) 161 lb 2.5 oz (73.1 kg) 168 lb 3.4 oz (76.3 kg)    Telemetry    Sinus rhythm in the 80's-90's - Personally Reviewed  ECG    No new tracings - Personally Reviewed  Physical  Exam   GEN: Chronicallyi lll appearing elderly male, No acute distress.   Neck: No JVD Cardiac: RRR, no murmurs, rubs, or gallops.  Respiratory: Coarse expiratory wheezes and scattered rhonchi GI: Soft, tender in RUQ, non-distended  MS: puffy pedal edema; No deformity. Neuro:  Nonfocal  Psych: Normal affect   Labs    Chemistry Recent Labs  Lab 04/24/17 2054  04/26/17 0311 04/27/17 0327 04/29/17 0319 04/30/17 0320  NA 138   < > 140 140 141 141  K 4.1   < > 4.5 4.3 4.8 5.0  CL 99*   < > 106 108 112* 113*  CO2 22   < > 26 27 22  21*  GLUCOSE 234*   < > 242* 235* 216* 176*  BUN 26*   < > 45* 36* 51* 63*  CREATININE 0.74   < > 0.87 0.57* 1.00 1.79*  CALCIUM 9.4   < > 8.6* 8.4* 8.4* 8.3*  PROT 7.4  --  5.7*  --   --   --   ALBUMIN 3.6  --  2.5*  --   --   --   AST 26  --  25  --   --   --   ALT 22  --  22  --   --   --  ALKPHOS 132*  --  79  --   --   --   BILITOT 0.9  --  0.7  --   --   --   GFRNONAA >60   < > >60 >60 >60 35*  GFRAA >60   < > >60 >60 >60 40*  ANIONGAP 17*   < > 8 5 7 7    < > = values in this interval not displayed.     Hematology Recent Labs  Lab 04/28/17 0317 04/29/17 0319 04/30/17 0320  WBC 23.6* 28.3* 28.6*  RBC 4.02* 4.21* 4.33  HGB 12.0* 12.7* 13.3  HCT 37.7* 39.7 40.8  MCV 93.8 94.3 94.2  MCH 29.9 30.2 30.7  MCHC 31.8 32.0 32.6  RDW 14.0 14.5 14.3  PLT 251 292 311    Cardiac Enzymes Recent Labs  Lab 04/25/17 0555 04/25/17 1056 04/25/17 2009  TROPONINI 0.24* 0.03* <0.03    Recent Labs  Lab 04/24/17 2238 04/25/17 0826  TROPIPOC 0.05 0.04     BNP Recent Labs  Lab 04/24/17 2227  BNP 440.6*     DDimer No results for input(s): DDIMER in the last 168 hours.   Radiology    Dg Chest 1 View  Result Date: 04/29/2017 CLINICAL DATA:  History diabetes. History of smoking. Follow-up exam. Recent thoracentesis. EXAM: CHEST 1 VIEW COMPARISON:  None. FINDINGS: Stable cardiomegaly. Diffuse left lung infiltrate. Left pleural  effusion appears to have recurred. Residual left apical pneumothorax may remain. IMPRESSION: 1. Large left pleural effusion appears to have recurred. Residual left apical pneumothorax may remain. 2. Persistent left lung infiltrate. 3. Stable cardiomegaly . Electronically Signed   By: Marcello Moores  Register   On: 04/29/2017 14:53   Dg Chest Port 1 View  Result Date: 04/30/2017 CLINICAL DATA:  Empyema EXAM: PORTABLE CHEST 1 VIEW COMPARISON:  04/29/2017 FINDINGS: Large left-sided pleural effusion is again identified. There is some lucency noted in the left apex likely representing a component of hydropneumothorax stable from the previous exam. Patchy infiltrative changes are noted throughout the aerated left lung. The right lung is clear. Cardiac shadow is stable. No bony abnormality is noted. IMPRESSION: Stable left pleural effusion and likely component of hydropneumothorax. No new focal abnormality is noted. Stable left-sided infiltrates. Electronically Signed   By: Inez Catalina M.D.   On: 04/30/2017 07:36    Cardiac Studies   Echocardiogram 04/25/2017 Study Conclusions  - Left ventricle: The cavity size was normal. There was mild concentric hypertrophy. Systolic function was normal. The estimated ejection fraction was in the range of 60% to 65%. Wall motion was normal; there were no regional wall motion abnormalities. The study is not technically sufficient to allow evaluation of LV diastolic function. - Ventricular septum: The contour showed diastolic flattening. - Mitral valve: Calcified annulus. Mildly thickened leaflets . - Right ventricle: The cavity size was moderately dilated. Wall thickness was normal. - Right atrium: The atrium was mildly dilated. - Pulmonary arteries: Systolic pressure was moderately to severely increased. PA peak pressure: 62 mm Hg (S).   Patient Profile     76 y.o. male with a hx of stage IV non-small cell lung cancer favoring squamous cell  carcinoma of the left upper lung on chemotherapy, DM type 2 who presented with hypoxic and hypercarbic respiratory failure. Was found to have multifocal pneumonia with empyema, large pleural effusion-tapped and sepsis. Developed afib with RVR.   Assessment & Plan    Atrial fibrillation with RVR -New onset afib yesterday am in setting of  stage IV metastatic lung CA with postobstructive PNA, COPD exacerbation and pleural effusion.  -Initially started on cardizem drip, but BP did not tolerate. Switched to amiodarone. Converted to sinus rhythm about 4:30 am with rates in the 80's-90's. -BP stable -Echo this admission showed normal LV function and normal LA size.  -Not anticoagulated at this point with his significant comorbid diseases and the fact that the afib is new and likely related to acute illness.  -Can switch to oral amiodarone  Metastatic stage IV lung cancer -Recently started on palliative chemotherapy, followed by Dr. [name]. 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  -Was started on Palliative chemo with carboplatin, paclitaxel and Keytruda 3 weeks PTA.  -Followed by Pulmonology  Acute respiratory failure  -Was on BiPap, now improved and on nasal cannula  Left sided pleural effusion  -S/P US thoracentesis with removal of 1.4L of turbid brown fluid  Sepsis from multifocal pneumonia with empyema -management per pulmonology and ID.  -WBCs still up today 28.6, no fever.   For questions or updates, please contact [place] Please consult [email or web address] for contact info under Cardiology/STEMI.      Signed, Daune Perch, NP  04/30/2017, 8:54 AM    History and all data above reviewed.  Patient examined.  I agree with the findings as above. He looks much better.  No acute pain.   The patient exam reveals COR:RRR  ,  Lungs: decreased breath sounds throughout,  Abd: Positive bowel sounds, no rebound no  guarding, Ext Mild edema   .  All available labs, radiology testing, previous records reviewed. Agree with documented assessment and plan. Atrial fib:  Back in NSR.  Try to switch to PO amiodarone today.  If he goes back into atrial fib with resultant hypotension restart the IV.  We will see as needed.   Jeneen Rinks [facility]  9:54 AM  04/30/2017

## 2017-04-30 NOTE — Procedures (Signed)
Ultrasound-guided diagnostic and therapeutic left thoracentesis performed yielding 2.3 liters of turbid light brown, yellow colored fluid. No immediate complications. Follow-up chest CT is pending.  Henreitta Cea 11:56 AM 04/30/2017

## 2017-04-30 NOTE — Progress Notes (Addendum)
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  Patient being currently managed for multiofocal pneumonia and left-sided pleural effusion.   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  Likely postobstructive pneumonia.  Possible contribution of COPD exacerbation also. Patient on/off BiPAP .On nasal cannula this morning. PCCM  following.  Patient on  ceftriaxone Also on Xopenex,  Pulmicort and Atrovent. We will follow blood culture, sputum culture, Pleural fluid culture.So far negative. CXR done on 04/30/17 again showed large right-sided pleural effusion.Underwent right  thoracentesis today with removal of 2.3 L of turbid brown fluid.Will follow-up fluid analysis.  Depending upon that,placement of pigtail catheter will be decided.  Metastatic stage IV lung cancer:Recently started on palliative chemotherapy.Follows with Dr. [name].  The patient has a stage IV non-small cell lung cancer.  Patient has left upper lobe lung mass.  Also has mediastinal and left hilar adenopathy as well as highly suspicious for malignant left pleural  effusion. Was on palliative systemic chemotherapy with carboplatin, paclitaxel and Keytruda. Oncology informed about the presence of patient here.  Left-sided pleural effusion: S/P [place] thoracocentesis again today with removal of 2.3L of turbid brown fluid.   We will follow-up fluid analysis.Previous pleural food culture revealed rare viridans Streptococcus  Sepsis: Presented with elevated lactate and leukocytosis.  We will continue antibiotics.  Follow-up cultures.  Continue gentle IV fluids. Has severe leukocytosis today. Infectious disease following   Afib with RVR:   Started on amiodarone.  Cardiology following   echocardiogram showed normal left ventricular function, normal ejection fraction, mild concentric hypertrophy  AKI: We will continue IV fluids.  We will check kidney function tomorrow.  Diabetes type 2: We will continue sliding scale insulin and lantus. We will continue to monitor the blood glucose.  Hyperglycemia likely secondary to steroids.  Steroids have been discontinued.  Leukocytosis: Secondary to pneumonia .  We will continue to monitor the trend.  Moderate protein energy malnutrition: Nutrition following   Pressure injury: Continue supportive care.  Frequent turning  I have discussed extensively about the current status and prognosis with the patient and family .Both patient and family expect all aggressive management if necessary.  Patient remains as full code.  DVT prophylaxis:Lovenox Code Status: Full Family Communication: None present on bed side Disposition Plan: Home/SNF   Consultants: Pulmonary and critical care/ID/Cardiology  Procedures: Echocardiogram  Antimicrobials:  Ceftriaxone since 04/29/17 Subjective: Patient seen and examined at bedside this morning.  Remains more comfortable than yesterday.  HR controlled  Objective: Vitals:   04/30/17 1000 04/30/17 1100 04/30/17 1119 04/30/17 1200  BP: (!) 126/48 (!) 116/53 (!) 171/55 (!) 105/39  Pulse:  92 91  87  Resp: (!) 23 (!) 24  (!) 25  Temp: 98.2 F (36.8 C) 98.2 F (36.8 C)    TempSrc:      SpO2: 97% 96%  92%  Weight:      Height:        Intake/Output Summary (Last 24 hours) at 04/30/2017 1253 Last data filed at 04/30/2017 1200 Gross per 24 hour  Intake 3224.41 ml  Output 1300 ml  Net 1924.41 ml   Filed Weights   04/28/17 0406 04/29/17 0500 04/30/17 0208  Weight: 72.1 kg (158 lb 15.2 oz) 73.1 kg (161 lb 2.5 oz) 76.3 kg (168 lb 3.4 oz)    Examination:  General exam: Chronically ill, in moderate respiratory distress, cachectic, Respiratory system: Bilateral decreased air entry more on the left  cardiovascular system: A. fib , No JVD, murmurs, rubs, gallops or clicks. No pedal edema. Gastrointestinal system: Abdomen is nondistended, soft and nontender. No organomegaly or masses felt. Normal bowel sounds heard. Central nervous system: Alert and oriented. No focal neurological deficits. Extremities: No edema, no clubbing ,no cyanosis, distal peripheral pulses palpable. Skin: No cyanosis,No pallor,No Rash,No Ulcer Psychiatry: Judgement and insight appear normal. Mood & affect appropriate.   Data Reviewed: I have personally reviewed following labs and imaging studies  CBC: Recent Labs  Lab 04/24/17 2054  04/26/17 0311 04/27/17 0327 04/28/17 0317 04/29/17 0319 04/30/17 0320  WBC 27.5*   < > 17.7* 17.6* 23.6* 28.3* 28.6*  NEUTROABS 25.8*  --   --  16.7* 22.7* 26.5* 26.8*  HGB 16.4   < > 13.2 11.8* 12.0* 12.7* 13.3  HCT 48.5   < > 41.5 36.9* 37.7* 39.7 40.8  MCV 93.3   < > 93.9 93.4 93.8 94.3 94.2  PLT 242   < > 226 211 251 292 311   < > = values in this interval not displayed.   Basic Metabolic Panel: Recent Labs  Lab 04/25/17 1057 04/26/17 0311 04/27/17 0327 04/29/17 0319 04/30/17 0320  NA 137 140 140 141 141  K 4.1 4.5 4.3 4.8 5.0  CL 103 106 108 112* 113*  CO2 26 26 27 22  21*  GLUCOSE 236* 242* 235* 216* 176*  BUN 33* 45* 36* 51* 63*  CREATININE  0.84 0.87 0.57* 1.00 1.79*  CALCIUM 8.4* 8.6* 8.4* 8.4* 8.3*   GFR: Estimated Creatinine Clearance: 36.8 mL/min (A) (by C-G formula based on SCr of 1.79 mg/dL (H)). Liver Function Tests: Recent Labs  Lab 04/24/17 2054 04/26/17 0311  AST 26 25  ALT 22 22  ALKPHOS 132* 79  BILITOT 0.9 0.7  PROT 7.4 5.7*  ALBUMIN 3.6 2.5*   No results for input(s): LIPASE, AMYLASE in the last 168 hours. No results for input(s): AMMONIA in the last 168 hours. Coagulation Profile: No results for input(s): INR, PROTIME in the last 168 hours. Cardiac Enzymes: Recent Labs  Lab 04/25/17 0555 04/25/17 1056 04/25/17 2009  TROPONINI 0.24* 0.03* <0.03   BNP (last 3 results) No results for input(s): PROBNP in the last 8760 hours. HbA1C: No results for input(s): HGBA1C in the last 72 hours. CBG: Recent Labs  Lab 04/29/17 1124 04/29/17 1652 04/29/17 2107 04/30/17 0718 04/30/17 1231  GLUCAP 162* 198* 150* 144* 144*   Lipid Profile: No results for input(s): CHOL, HDL, LDLCALC, TRIG, CHOLHDL, LDLDIRECT in the last 72 hours. Thyroid Function Tests: No results for input(s): TSH, T4TOTAL, FREET4, T3FREE, THYROIDAB in the last 72 hours. Anemia Panel: No results for input(s): VITAMINB12, FOLATE,  FERRITIN, TIBC, IRON, RETICCTPCT in the last 72 hours. Sepsis Labs: Recent Labs  Lab 04/25/17 1643 04/25/17 2009 04/25/17 2258 04/26/17 1242  LATICACIDVEN 2.5* 2.3* 2.6* 2.0*    Recent Results (from the past 240 hour(s))  Culture, blood (routine x 2)     Status: None   Collection Time: 04/24/17 11:41 PM  Result Value Ref Range Status   Specimen Description BLOOD LEFT ANTECUBITAL  Final   Special Requests   Final    BOTTLES DRAWN AEROBIC AND ANAEROBIC Blood Culture results may not be optimal due to an inadequate volume of blood received in culture bottles   Culture   Final    NO GROWTH 5 DAYS Performed at [facility] [address], [place], [place] [address]    Report Status 04/30/2017  FINAL  Final  MRSA PCR Screening     Status: None   Collection Time: 04/25/17  3:41 PM  Result Value Ref Range Status   MRSA by PCR NEGATIVE NEGATIVE Final    Comment:        The GeneXpert MRSA Assay (FDA approved for NASAL specimens only), is one component of a comprehensive MRSA colonization surveillance program. It is not intended to diagnose MRSA infection nor to guide or monitor treatment for MRSA infections.   Culture, blood (routine x 2)     Status: None (Preliminary result)   Collection Time: 04/25/17  4:43 PM  Result Value Ref Range Status   Specimen Description BLOOD RIGHT ANTECUBITAL  Final   Special Requests IN PEDIATRIC BOTTLE Blood Culture adequate volume  Final   Culture   Final    NO GROWTH 4 DAYS Performed at [facility], [place] [address], [place], [place] [address]    Report Status PENDING  Incomplete  Body fluid culture     Status: None (Preliminary result)   Collection Time: 04/26/17 12:24 PM  Result Value Ref Range Status   Specimen Description PLEURAL LEFT  Final   Special Requests NONE  Final   Gram Stain   Final    ABUNDANT WBC PRESENT, PREDOMINANTLY PMN MODERATE GRAM POSITIVE COCCI IN PAIRS IN CHAINS Performed at [facility], [place] [address] [place],  [address]    Culture RARE VIRIDANS STREPTOCOCCUS  Final   Report Status PENDING  Incomplete  Fungus Culture With Stain     Status: None (Preliminary result)   Collection Time: 04/26/17 12:24 PM  Result Value Ref Range Status   Fungus Stain Final report  Final    Comment: (NOTE) Performed At: [facility] [place], [place] [id number] Rush Farmer MD WP:[id number]    Fungus (Mycology) Culture PENDING  Incomplete   Fungal Source PLEURAL  Final    Comment: LEFT  Fungus Culture Result     Status: None   Collection Time: 04/26/17 12:24 PM  Result Value Ref Range Status   Result 1 Comment  Final    Comment: (NOTE) KOH/Calcofluor preparation:  no fungus  observed. Performed At: [facility] [place], [place] [id number] Rush Farmer MD LP:[id number]          Radiology Studies: Dg Chest 1 View  Result Date: 04/29/2017 CLINICAL DATA:  History diabetes. History of smoking. Follow-up exam. Recent thoracentesis. EXAM: CHEST 1 VIEW COMPARISON:  None. FINDINGS: Stable cardiomegaly. Diffuse left lung infiltrate. Left pleural effusion appears to have recurred. Residual left apical pneumothorax may remain. IMPRESSION: 1. Large left pleural effusion appears to have recurred. Residual  left apical pneumothorax may remain. 2. Persistent left lung infiltrate. 3. Stable cardiomegaly . Electronically Signed   By: Marcello Moores  Register   On: 04/29/2017 14:53   Ct Chest W Contrast  Result Date: 04/30/2017 CLINICAL DATA:  Inpatient. 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 Patient admitted with multifocal pneumonia, large left pleural effusion, sepsis and respiratory failure. Status post left thoracentesis earlier today. EXAM: CT CHEST WITH CONTRAST TECHNIQUE: Multidetector CT imaging of the chest was performed during intravenous contrast administration. CONTRAST:  49mL ISOVUE-300 IOPAMIDOL (ISOVUE-300) INJECTION 61% COMPARISON:  Chest radiograph from earlier today. 04/24/2017 chest CT angiogram. FINDINGS: Cardiovascular: Normal heart size. Trace pericardial effusion/thickening, stable. Atherosclerotic nonaneurysmal thoracic aorta. Normal caliber main pulmonary artery. No central pulmonary emboli. Significant extrinsic narrowing of the central left pulmonary artery is by tumor is not appreciably changed since 04/24/2017. Mediastinum/Nodes: No discrete thyroid nodules. No acute esophageal abnormality. No axillary adenopathy. Asymmetric mildly prominent 0.7 cm left internal mammary node (series 2/ image 58), previously 0.5 cm on 04/24/2017, minimally  increased. Mildly prominent 0.9 cm left pericardiophrenic node (series 2/ image 123), previously 0.5 cm on 04/24/2017, mildly increased. No right hilar adenopathy. Lungs/Pleura: There is a poorly marginated infiltrative partially cavitary central left lung mass measuring approximately 10.9 x 6.6 cm (series 2/ image 74), which is contiguous with the left hilar and left subcarinal nodal territories, which appears mildly increased from 10.6 x 5.7 cm on 04/24/2017 using similar measurement technique. Persistent occluded left upper lobe bronchus with complete left upper lobe atelectasis. Patchy consolidation and volume loss throughout the left lower lobe has worsened since 04/24/2017. Patchy consolidation and ground-glass attenuation in the dependent right upper lobe and dependent right middle lobe and patchy ground-glass centrilobular nodularity in the right lower lobe of fall worsened since 04/24/2017 chest CT. Large loculated left hydropneumothorax with multiple fluid levels, overall stable in size since 04/24/2017 chest CT, with new left pneumothorax component and decreased left pleural fluid component. Trace dependent right pleural effusion with mild dependent right lower lobe atelectasis, new. Underlying moderate centrilobular and paraseptal emphysema with diffuse bronchial wall thickening. Stable calcified subcentimeter dependent basilar right lower lobe granuloma. Upper abdomen: Cholelithiasis. Musculoskeletal:  No aggressive appearing focal osseous lesions. IMPRESSION: 1. Worsening bilateral multilobar pneumonia. 2. Large left hydropneumothorax, overall stable in size since 04/24/2017 chest CT, with new left pneumothorax component and with decreased left pleural fluid component status post interval thoracentesis. 3. Infiltrative poorly marginated partially cavitary central left lung malignancy with left hilar and subcarinal mediastinal invasion, apparently mildly increased in size in the interval. Stable  occluded left upper lobe bronchus with complete left upper lobe atelectasis. 4. Nonspecific mild left internal mammary and left pericardiophrenic mediastinal adenopathy is mildly increased. 5. New trace dependent right pleural effusion. 6. Cholelithiasis. Aortic Atherosclerosis (ICD10-I70.0) and Emphysema (ICD10-J43.9). Electronically Signed   By: Ilona Sorrel M.D.   On: 04/30/2017 12:37   Dg Chest Port 1 View  Result Date: 04/30/2017 CLINICAL DATA:  Empyema EXAM: PORTABLE CHEST 1 VIEW COMPARISON:  04/29/2017 FINDINGS: Large left-sided pleural effusion is again identified. There is some lucency noted in the left apex likely representing a component of hydropneumothorax stable from the previous exam. Patchy infiltrative changes are noted throughout the aerated left lung. The right lung is clear. Cardiac shadow is stable. No bony abnormality is noted. IMPRESSION: Stable left pleural effusion and likely component of hydropneumothorax. No new focal abnormality is noted. Stable left-sided infiltrates. Electronically  Signed   By: Inez Catalina M.D.   On: 04/30/2017 07:36        Scheduled Meds: . amiodarone  400 mg Oral BID  . budesonide (PULMICORT) nebulizer solution  0.25 mg Nebulization BID  . chlorhexidine  15 mL Mouth Rinse BID  . enoxaparin (LOVENOX) injection  40 mg Subcutaneous QHS  . feeding supplement (GLUCERNA SHAKE)  237 mL Oral TID BM  . insulin aspart  0-9 Units Subcutaneous TID WC  . insulin glargine  15 Units Subcutaneous Daily  . iopamidol      . ipratropium  0.5 mg Nebulization Q6H  . levalbuterol  0.63 mg Nebulization Q6H  . mouth rinse  15 mL Mouth Rinse q12n4p  . metoprolol tartrate  25 mg Oral BID   Continuous Infusions: . sodium chloride 100 mL/hr at 04/30/17 1200  . amiodarone Stopped (04/30/17 1012)  . cefTRIAXone (ROCEPHIN)  IV Stopped (04/29/17 1207)     LOS: 5 days    Time spent: 25 mins    Havana Baldwin Jodie Echevaria, MD [facility] Pager [phone]  If  7PM-7AM, please contact night-coverage [email or web address] Password [facility] 04/30/2017, 12:53 PM

## 2017-04-30 NOTE — Progress Notes (Signed)
Date: April 30, 2017 Rhonda Davis, BSN, RN3, CCM [phone] Chart and notes review for patient progress and needs. Will follow for case management and discharge needs. Next review date: [id number] 

## 2017-04-30 NOTE — Progress Notes (Signed)
[place] PCCM PROGRESS NOTE  Date of Admission: 04/24/2017 Date of Consult: 04/25/2017 Referring Provider: Dr. Hal Hope, [facility] Chief Complaint: Short of breath  HPI: 76 yo male former smoker presented with dyspnea, hypoxia, and productive cough.  He has hx of Stage IV NSCLC (squamous cell) and started chemotherapy [place] [place], carboplatin, paclitaxel) 3 weeks prior to admission.  He was found to have multifocal pneumonia and large Lt pleural effusion.  Past Medical History: He  has a past medical history of Diabetes mellitus without complication ([place]) and Malignant neoplasm of unspecified part of unspecified bronchus or lung ([place]) (03/30/2017).  Subjective: Has intermittent cough.  Vital signs: BP (!) 126/51   Pulse 92   Temp 98.2 F (36.8 C)   Resp (!) 25   Ht 5\' 11"  (1.803 m)   Wt 168 lb 3.4 oz (76.3 kg)   SpO2 97%   BMI 23.46 kg/m   Intake/Output: I/O last 3 completed shifts: In: 2991.1 [P.O.:720; I.V.:1971.1; IV Piggyback:300] Out: 1150 [Urine:1150]  Physical Exam:  General - cachectic Eyes - pupils reactive ENT - no sinus tenderness, no oral exudate, no LAN Cardiac - regular, no murmur Chest - decreased BS on Lt Abd - soft, non tender Ext - no edema, decreased BS Skin - no rashes Neuro - normal strength Psych - normal mood  Discussion: 76 yo male with pneumococcal pneumonia and empyema leading to acute hypoxic/hypercapnic respiratory failure in setting of Stage IV NSCLC.  Assessment/Plan:  Acute respiratory failure with hypoxia, hypercapnia. - oxygen to keep SpO2 90 to 95% - Bipap prn  Sepsis from multifocal pneumonia with empyema. - ABx per ID - IR to arrange for repeat Lt thoracentesis and repeat fluid analysis - depending on repeat fluid analysis will determine if he needs to have pig tail catheter placed - he is not a candidate for surgical intervention of pleural space  Emphysema with hx of tobacco abuse. - scheduled BDs  A fib with RVR. - per  cardiology and primary team  Stage 4 NSCLC. - agree with plan for palliative care consultation  DVT prophylaxis: Lovenox SUP: Not indicated Diet: Carb modified, heart healthy Goals of care: Full code.  Palliative care consulted.  Chesley Mires, MD [facility] 04/30/2017, 9:33 AM Pager:  [phone] After 3pm call: [phone]  FLOW SHEET  Cultures: Blood 12/21 >>  Blood 12/22 >>  Pneumococcal Ag 12/22 >> positive Legionella Ag 12/22 >> negative Lt pleural fluid >> GPC in chains  Antibiotics: Vancomycin 12/21 >> 12/24  Cefepime 12/21 >> 12/25 Zithromax 12/21 >> 12/25 Rocephin 12/26 >>  Studies: CT angio chest 12/21 >> Lt mediastinal mass with compression of Lt main bronchus and Lt PA, large Lt effusion, ASD on Lt, patchy ASD on Rt, emphysema Echo 12/22 >> EF 60 to 65%, PAS 62 mmHg Lt thoracentesis 12/23 >> 1.4 liters, glucose < 20, protein < 3, WBC 78,475 (93%N)   Events: 12/22 Admit  Lines/tubes:  Consults: 12/26 ID 12/26 Oncology 12/26 Cardiology 12/26 Palliative care  Resolved Problems:  Labs: CMP Latest Ref Rng & Units 04/30/2017 04/29/2017 04/27/2017  Glucose 65 - 99 mg/dL 176(H) 216(H) 235(H)  BUN 6 - 20 mg/dL 63(H) 51(H) 36(H)  Creatinine 0.61 - 1.24 mg/dL 1.79(H) 1.00 0.57(L)  Sodium 135 - 145 mmol/L 141 141 140  Potassium 3.5 - 5.1 mmol/L 5.0 4.8 4.3  Chloride 101 - 111 mmol/L 113(H) 112(H) 108  CO2 22 - 32 mmol/L 21(L) 22 27  Calcium 8.9 - 10.3 mg/dL 8.3(L) 8.4(L) 8.4(L)  Total  Protein 6.5 - 8.1 g/dL - - -  Total Bilirubin 0.3 - 1.2 mg/dL - - -  Alkaline Phos 38 - 126 U/L - - -  AST 15 - 41 U/L - - -  ALT 17 - 63 U/L - - -    CBC Latest Ref Rng & Units 04/30/2017 04/29/2017 04/28/2017  WBC 4.0 - 10.5 K/uL 28.6(H) 28.3(H) 23.6(H)  Hemoglobin 13.0 - 17.0 g/dL 13.3 12.7(L) 12.0(L)  Hematocrit 39.0 - 52.0 % 40.8 39.7 37.7(L)  Platelets 150 - 400 K/uL 311 292 251    CBG (last 3)  Recent Labs    04/29/17 1652 04/29/17 2107  04/30/17 0718  GLUCAP 198* 150* 144*    Imaging: Dg Chest 1 View  Result Date: 04/29/2017 CLINICAL DATA:  History diabetes. History of smoking. Follow-up exam. Recent thoracentesis. EXAM: CHEST 1 VIEW COMPARISON:  None. FINDINGS: Stable cardiomegaly. Diffuse left lung infiltrate. Left pleural effusion appears to have recurred. Residual left apical pneumothorax may remain. IMPRESSION: 1. Large left pleural effusion appears to have recurred. Residual left apical pneumothorax may remain. 2. Persistent left lung infiltrate. 3. Stable cardiomegaly . Electronically Signed   By: Marcello Moores  Register   On: 04/29/2017 14:53   Dg Chest Port 1 View  Result Date: 04/30/2017 CLINICAL DATA:  Empyema EXAM: PORTABLE CHEST 1 VIEW COMPARISON:  04/29/2017 FINDINGS: Large left-sided pleural effusion is again identified. There is some lucency noted in the left apex likely representing a component of hydropneumothorax stable from the previous exam. Patchy infiltrative changes are noted throughout the aerated left lung. The right lung is clear. Cardiac shadow is stable. No bony abnormality is noted. IMPRESSION: Stable left pleural effusion and likely component of hydropneumothorax. No new focal abnormality is noted. Stable left-sided infiltrates. Electronically Signed   By: Inez Catalina M.D.   On: 04/30/2017 07:36

## 2017-04-30 NOTE — Progress Notes (Signed)
[name]    Date of Admission:  04/24/2017   Total days of antibiotics 7        Day 2 ceftriaxone   ID: Maurice Tyler is a 76 y.o. 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 amiodarone  400 mg Oral BID  . budesonide (PULMICORT) nebulizer solution  0.25 mg Nebulization BID  . chlorhexidine  15 mL Mouth Rinse BID  . enoxaparin (LOVENOX) injection  40 mg Subcutaneous QHS  . feeding supplement (GLUCERNA SHAKE)  237 mL Oral TID BM  . insulin aspart  0-9 Units Subcutaneous TID WC  . insulin glargine  15 Units Subcutaneous Daily  . ipratropium  0.5 mg Nebulization Q6H  . levalbuterol  0.63 mg Nebulization Q6H  . mouth rinse  15 mL Mouth Rinse q12n4p  . metoprolol tartrate  25 mg Oral BID    Objective: Vital signs in last 24 hours: Temp:  [97.4 F (36.3 C)-98.6 F (37 C)] 98.2 F (36.8 C) (12/27 0900) Pulse Rate:  [63-148] 92 (12/27 0900) Resp:  [21-35] 25 (12/27 0900) BP: (89-135)/(46-87) 126/51 (12/27 0900) SpO2:  [91 %-99 %] 97 % (12/27 0901) Weight:  [168 lb 3.4 oz (76.3 kg)] 168 lb 3.4 oz (76.3 kg) (12/27 0208) Physical Exam  Constitutional: He is sleeping. Appears stated age, chronically illNo distress.  Cardiovascular: Normal rate, regular rhythm and normal heart sounds. Exam reveals no gallop and no friction rub.  No murmur heard.  Pulmonary/Chest: Effort normal and breath  sounds normal. Decrease breath sounds on the right Abdominal: Soft. Bowel sounds are normal. He exhibits no distension. There is no tenderness.   Ext: warm, 2+ pulses Skin: Skin is warm and dry. No rash noted. No erythema.     Lab Results Recent Labs    04/29/17 0319 04/30/17 0320  WBC 28.3* 28.6*  HGB 12.7* 13.3  HCT 39.7 40.8  NA 141 141  K 4.8 5.0  CL 112* 113*  CO2 22 21*  BUN 51* 63*  CREATININE 1.00 1.79*    Microbiology: reviewed Studies/Results: Dg Chest 1 View  Result Date: 04/29/2017 CLINICAL DATA:  History diabetes. History of smoking. Follow-up exam. Recent thoracentesis. EXAM: CHEST 1 VIEW COMPARISON:  None. FINDINGS: Stable cardiomegaly. Diffuse left lung infiltrate. Left pleural effusion appears to have recurred. Residual left apical pneumothorax may remain. IMPRESSION: 1. Large left pleural effusion appears to have recurred. Residual left apical pneumothorax may remain. 2. Persistent left lung infiltrate. 3. Stable cardiomegaly . Electronically Signed   By: Marcello Moores  Register   On: 04/29/2017 14:53   Dg Chest Port 1 View  Result Date: 04/30/2017 CLINICAL DATA:  Empyema EXAM: PORTABLE CHEST 1 VIEW COMPARISON:  04/29/2017 FINDINGS: Large left-sided pleural effusion is again identified. There is some lucency noted in the left apex likely representing a component of hydropneumothorax stable from the previous exam. Patchy infiltrative changes are noted throughout the aerated left lung.  The right lung is clear. Cardiac shadow is stable. No bony abnormality is noted. IMPRESSION: Stable left pleural effusion and likely component of hydropneumothorax. No new focal abnormality is noted. Stable left-sided infiltrates. Electronically Signed   By: Inez Catalina M.D.   On: 04/30/2017 07:36     Assessment/Plan: 76yo M with newly diagnosed stage IV NSCLC on palliative chemo complicated by pneumococcal pneumonia and empyema leading to acute hypoxic/hypercapnic respiratory failure  --  Continue with ceftriaxone 2gm Iv daily. Agree with plan for chest CT to see if any abscess development and repeat thoracentesis vs pigtail to see if any improvement  Leukocytosis = still remains elevated, likely due to not having source control, suspect it may decrease once effusion is drainged.  Stage IV NSCLC = palliative care consult pending   [facility] Cell: [phone] Pager: [phone]  04/30/2017, 10:59 AM

## 2017-04-30 NOTE — Progress Notes (Signed)
Patient had repeat thoracentesis today with 2.3L removed.  He underwent thora on 12/23 with 1.4L removed.  At that time he had a small hydroPTX component noted on follow up chest x-ray.  Follow up CT scan today shows reduction in fluid component of effusion, but stable to slight increase in hydroPTX component.  His imaging was reviewed with Dr. Pascal Lux who said we will await new culture results to see if the fluid is still infected.  If so, then we can place a drain if still needed at the time.  However, if his fluid is no longer infected, then we would defer drain placement as he has a trapped lung and then drain will likely make no difference in his outcome.  I discussed this with Dr. Halford Chessman who agreed with this plan.  Repeat CXR tonight and in am per Dr. Pascal Lux to follow up on fluid build and PTX component.  Henreitta Cea 1:39 PM 04/30/2017

## 2017-04-30 NOTE — Consult Note (Signed)
Consultation Note Date: 04/30/2017   Patient Name: Maurice Tyler  DOB: [birth year]  MRN: [id number]  Age / Sex: 76 y.o., male  PCP: [name], MD Referring Physician: Marene Lenz, MD  Reason for Consultation: Establishing goals of care  HPI/Patient Profile: 76 y.o. male  with past medical history of diabetes mellitus type II, stage IV NSCLC with obstructive LUL mass as well as mediastinal and left hilar adenopathy diagnosed recently with 1 treatment with carboplatin, paclitaxel and Keytruda 04/15/17 admitted on 04/24/2017 with worsening shortness of breath along with productive cough, diarrhea, and failure to thrive with significant weight loss.   Clinical Assessment and Goals of Care: Mr. Devine is somnolent today and did not awaken to discuss with me. I did meet today with his wife, daughter, and son. They seem to understand that he is very ill and I believe that the children have more understanding of the poor prognosis. I went further into their hopes and expectations. His wife is quick to tell me that his desire "is to fight."   I discussed his cancer including stage IV, poor prognosis, and that this is incurable even with best results from chemotherapy. We discussed that the goal of radiation and chemotherapy is to slow and maintain the cancer as long as possible. Wife says that she believed chemo would "get rid of the tumor." We discussed barriers to improvement for infection and barrier to improvement in general to even get well enough to receive more chemotherapy which would be the only intervention that may slow progression.   Family seems exhausted and very overwhelmed so we agree to meet again tomorrow and hopefully Mr. Holtman will be able to participate. We discussed the importance of further conversation in recognizing his physical limits in aggressive care and also our limitations  in medicine in what we can fix and reverse. Children seem motivated to discuss with their father his wishes. Will remeet tomorrow 02-1029.   Primary Decision Maker NEXT OF KIN wife    SUMMARY OF RECOMMENDATIONS   - Will remeet tomorrow with hopes patient will be able to engage in conversation  Code Status/Advance Care Planning:  Full code - hope to address more specifically tomorrow   Symptom Management:   Per PCCM given desire for continued aggressive care at this time  Palliative Prophylaxis:   Aspiration, Delirium Protocol, Frequent Pain Assessment, Oral Care and Turn Reposition  Additional Recommendations (Limitations, Scope, Preferences):  Full Scope Treatment  Psycho-social/Spiritual:   Desire for further Chaplaincy support:yes  Additional Recommendations: Caregiving  Support/Resources, Education on Hospice and Grief/Bereavement Support  Prognosis:   Extremely poor with stage IV NSCLC complicated by pneumococcal pneumonia, empyema, and failure to thrive  Discharge Planning: To Be Determined      Primary Diagnoses: Present on Admission: . Stage IV squamous cell carcinoma of left lung ([place]) . Acute respiratory failure with hypoxia and hypercapnia (HCC) . Severe sepsis ([place]) . Multifocal pneumonia . Malnutrition of moderate degree . Pressure injury of skin . Empyema of right pleural space (  [place])   I have reviewed the medical record, interviewed the patient and family, and examined the patient. The following aspects are pertinent.  Past Medical History:  Diagnosis Date  . Diabetes mellitus without complication ([place])   . Malignant neoplasm of unspecified part of unspecified bronchus or lung ([place]) 03/30/2017   Social History   Socioeconomic History  . Marital status: Married    Spouse name: None  . Number of children: None  . Years of education: None  . Highest education level: None  Social Needs  . Financial resource strain: None  . Food insecurity  - worry: None  . Food insecurity - inability: None  . Transportation needs - medical: None  . Transportation needs - non-medical: None  Occupational History  . None  Tobacco Use  . Smoking status: Former Smoker    Years: 58.00    Types: Cigarettes    Last attempt to quit: 11/21/2016    Years since quitting: 0.4  . Smokeless tobacco: Never Used  Substance and Sexual Activity  . Alcohol use: No    Frequency: Never  . Drug use: No  . Sexual activity: None  Other Topics Concern  . None  Social History Narrative  . None   Family History  Problem Relation Age of Onset  . Cancer Mother    Scheduled Meds: . amiodarone  400 mg Oral BID  . budesonide (PULMICORT) nebulizer solution  0.25 mg Nebulization BID  . chlorhexidine  15 mL Mouth Rinse BID  . enoxaparin (LOVENOX) injection  40 mg Subcutaneous QHS  . feeding supplement (GLUCERNA SHAKE)  237 mL Oral TID BM  . insulin aspart  0-9 Units Subcutaneous TID WC  . insulin glargine  15 Units Subcutaneous Daily  . iopamidol      . ipratropium  0.5 mg Nebulization Q6H  . levalbuterol  0.63 mg Nebulization Q6H  . mouth rinse  15 mL Mouth Rinse q12n4p  . metoprolol tartrate  25 mg Oral BID   Continuous Infusions: . sodium chloride 100 mL/hr at 04/30/17 1200  . amiodarone Stopped (04/30/17 1012)  . cefTRIAXone (ROCEPHIN)  IV Stopped (04/30/17 1418)   PRN Meds:.acetaminophen **OR** acetaminophen, levalbuterol, loperamide, LORazepam, metoprolol tartrate, ondansetron **OR** ondansetron (ZOFRAN) IV, prochlorperazine No Known Allergies Review of Systems  Unable to perform ROS: Mental status change    Physical Exam  Constitutional: Vital signs are normal. He has a sickly appearance.  Thin, frail  Cardiovascular: Normal rate and regular rhythm.  Pulmonary/Chest: No accessory muscle usage. No tachypnea. No respiratory distress. He has decreased breath sounds in the left upper field, the left middle field and the left lower field.    Abdominal: Soft. Normal appearance.  Neurological:  Somnolent   Nursing note and vitals reviewed.   Vital Signs: BP (!) 97/42   Pulse 83   Temp 98.1 F (36.7 C)   Resp (!) 21   Ht 5\' 11"  (1.803 m)   Wt 76.3 kg (168 lb 3.4 oz)   SpO2 96%   BMI 23.46 kg/m  Pain Assessment: No/denies pain   Pain Score: 0-No pain   SpO2: SpO2: 96 % O2 Device:SpO2: 96 % O2 Flow Rate: .O2 Flow Rate (L/min): 6 L/min  IO: Intake/output summary:   Intake/Output Summary (Last 24 hours) at 04/30/2017 1536 Last data filed at 04/30/2017 1200 Gross per 24 hour  Intake 2868.16 ml  Output 1300 ml  Net 1568.16 ml    LBM: Last BM Date: 04/27/17 Baseline Weight: Weight: 69.9 kg (  154 lb 1.6 oz) Most recent weight: Weight: 76.3 kg (168 lb 3.4 oz)     Palliative Assessment/Data: 20%    Time Total: 60 min  Greater than 50%  of this time was spent counseling and coordinating care related to the above assessment and plan.  Signed by: Vinie Sill, NP Palliative Medicine Team Pager # [phone] (M-F 8a-5p) Team Phone # [phone] (Nights/Weekends)

## 2017-05-01 ENCOUNTER — Inpatient Hospital Stay (HOSPITAL_COMMUNITY): Payer: Medicare Other

## 2017-05-01 ENCOUNTER — Ambulatory Visit: Payer: Medicare Other

## 2017-05-01 ENCOUNTER — Telehealth: Payer: Self-pay | Admitting: *Deleted

## 2017-05-01 LAB — CULTURE, BLOOD (ROUTINE X 2)
CULTURE: NO GROWTH
SPECIAL REQUESTS: ADEQUATE

## 2017-05-01 LAB — CBC WITH DIFFERENTIAL/PLATELET
BASOS ABS: 0 10*3/uL (ref 0.0–0.1)
Basophils Relative: 0 %
Eosinophils Absolute: 0 10*3/uL (ref 0.0–0.7)
Eosinophils Relative: 0 %
HEMATOCRIT: 38.9 % — AB (ref 39.0–52.0)
HEMOGLOBIN: 12.1 g/dL — AB (ref 13.0–17.0)
LYMPHS PCT: 5 %
Lymphs Abs: 0.8 10*3/uL (ref 0.7–4.0)
MCH: 29.6 pg (ref 26.0–34.0)
MCHC: 31.1 g/dL (ref 30.0–36.0)
MCV: 95.1 fL (ref 78.0–100.0)
MONOS PCT: 4 %
Monocytes Absolute: 0.7 10*3/uL (ref 0.1–1.0)
NEUTROS ABS: 15.4 10*3/uL — AB (ref 1.7–7.7)
Neutrophils Relative %: 91 %
Platelets: 255 10*3/uL (ref 150–400)
RBC: 4.09 MIL/uL — AB (ref 4.22–5.81)
RDW: 14.4 % (ref 11.5–15.5)
WBC: 16.9 10*3/uL — AB (ref 4.0–10.5)

## 2017-05-01 LAB — BASIC METABOLIC PANEL
ANION GAP: 4 — AB (ref 5–15)
BUN: 32 mg/dL — ABNORMAL HIGH (ref 6–20)
CHLORIDE: 111 mmol/L (ref 101–111)
CO2: 27 mmol/L (ref 22–32)
Calcium: 8 mg/dL — ABNORMAL LOW (ref 8.9–10.3)
Creatinine, Ser: 0.66 mg/dL (ref 0.61–1.24)
GFR calc non Af Amer: >60 mL/min (ref >60)
Glucose, Bld: 88 mg/dL (ref 65–99)
POTASSIUM: 4.1 mmol/L (ref 3.5–5.1)
Sodium: 142 mmol/L (ref 135–145)

## 2017-05-01 LAB — GLUCOSE, CAPILLARY
GLUCOSE-CAPILLARY: 65 mg/dL (ref 65–99)
GLUCOSE-CAPILLARY: 74 mg/dL (ref 65–99)
GLUCOSE-CAPILLARY: 122 mg/dL — AB (ref 65–99)
Glucose-Capillary: 113 mg/dL — ABNORMAL HIGH (ref 65–99)

## 2017-05-01 MED ORDER — BOOST / RESOURCE BREEZE PO LIQD CUSTOM
1.0000 | Freq: Two times a day (BID) | ORAL | Status: DC
  Administered 2017-05-01 – 2017-05-20 (×30): 1 via ORAL

## 2017-05-01 NOTE — Progress Notes (Signed)
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  Patient being currently managed for multiofocal pneumonia and left-sided pleural effusion.   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  Likely postobstructive pneumonia.  Possible contribution of COPD exacerbation also. Patient on/off BiPAP .On nasal cannula this morning. PCCM  following.  Patient on  ceftriaxone Also on Xopenex,  Pulmicort and Atrovent. We will follow blood culture, sputum culture, Pleural fluid culture.So far negative. CXR done on 04/30/17 again showed large right-sided pleural effusion.Underwent right  thoracentesis  with removal of 2.3 L of turbid brown fluid.Will follow-up fluid analysis.  Depending upon that,placement of pigtail catheter will be decided. S/P thoracentesis twice. Peter fluid culture sent on 04/26/17 showed moderate gram-positive cocci in chains.  Likely streptococcal pneumonia and  empyema.  Metastatic stage IV lung cancer:Recently started on palliative chemotherapy.Follows with Dr. [name].  The patient has a stage IV non-small cell lung cancer.  Patient has left upper lobe lung mass.  Also has mediastinal and left hilar adenopathy.  Pleural fluid cytology sent on this admission did not show any malignant cells Was on palliative systemic chemotherapy with carboplatin, paclitaxel and Keytruda. Oncology informed about the presence of patient here.  Left-sided pleural effusion: S/P [place] thoracocentesis x 2. We will follow-up fluid analysis.Previous pleural food culture revealed rare viridans Streptococcus  Sepsis: Presented with elevated lactate and leukocytosis.  We will continue antibiotics.  Follow-up cultures.  Continue gentle IV fluids. Leucocytosis  trending down. Infectious disease following   Afib with RVR:   Started on amiodarone.  Cardiology was following   .Echocardiogram showed normal left ventricular function, normal ejection fraction, mild concentric hypertrophy  AKI: Resolved.We will continue IV fluids.    Diabetes type 2: We will continue sliding scale insulin . We will continue to monitor the blood glucose.     Moderate protein energy malnutrition: Nutrition following   Pressure injury: Continue supportive care.  Frequent turning  I have discussed extensively about the current status and prognosis with the patient and family .Both patient and family expect all aggressive management if necessary.  Patient remains as full code. Palliative care on board and in constant touch with family.  DVT prophylaxis:Lovenox Code Status: Full Family Communication: None present on bed side Disposition Plan: Unknown at this time   Consultants: Pulmonary and critical care/ID/Cardiology  Procedures: Echocardiogram  Antimicrobials:  Ceftriaxone since 04/29/17 Subjective: Patient seen and examined at bedside this morning.  Remains more comfortable than yesterday.   HR controlled  Objective: Vitals:   05/01/17 0700 05/01/17 0800 05/01/17 0910 05/01/17 1049  BP:  (!) 111/45  (!) 103/44  Pulse: 84 85  91  Resp: (!) 32 (!) 24    Temp: 98.4 F (36.9 C) 98.6 F (37 C)    TempSrc:      SpO2: 99% 99% 92%   Weight:      Height:        Intake/Output Summary (Last 24 hours) at 05/01/2017 1301 Last data filed at 05/01/2017 0500 Gross per 24 hour  Intake 1850 ml  Output 1325 ml  Net 525 ml   Filed Weights   04/29/17 0500 04/30/17 0208 05/01/17 0304  Weight: 73.1 kg (161 lb 2.5 oz) 76.3 kg (168 lb 3.4 oz) 75.5 kg (166 lb 7.2 oz)    Examination:  General exam: Chronically ill, cachectic, not in distress Respiratory system: Bilateral decreased air entry, more on the left  cardiovascular system: S1 & S2 heard, RRR. No JVD, murmurs, rubs, gallops or clicks. No pedal edema. Gastrointestinal system: Abdomen is nondistended, soft and nontender. No organomegaly or masses felt. Normal bowel sounds heard. Central nervous system: Alert and oriented. No focal neurological deficits. Extremities: No edema, no clubbing ,no cyanosis, distal peripheral pulses palpable. Skin: No cyanosis,No pallor,No Rash,No Ulcer Psychiatry: Judgement and insight appear normal. Mood & affect appropriate.   Data Reviewed: I have personally reviewed following labs and imaging studies  CBC: Recent Labs  Lab 04/27/17 0327 04/28/17 0317 04/29/17 0319 04/30/17 0320 05/01/17 0317  WBC 17.6* 23.6* 28.3* 28.6* 16.9*  NEUTROABS 16.7* 22.7* 26.5* 26.8* 15.4*  HGB 11.8* 12.0* 12.7* 13.3 12.1*  HCT 36.9* 37.7* 39.7 40.8 38.9*  MCV 93.4 93.8 94.3 94.2 95.1  PLT 211 251 292 311 388   Basic Metabolic Panel: Recent Labs  Lab 04/26/17 0311 04/27/17 0327 04/29/17 0319 04/30/17 0320 05/01/17 0317  NA 140 140 141 141 142  K 4.5 4.3 4.8 5.0 4.1  CL 106 108 112* 113* 111  CO2 26 27 22  21* 27  GLUCOSE 242* 235* 216* 176* 88  BUN 45* 36* 51* 63* 32*  CREATININE 0.87 0.57* 1.00  1.79* 0.66  CALCIUM 8.6* 8.4* 8.4* 8.3* 8.0*   GFR: Estimated Creatinine Clearance: 82.4 mL/min (by C-G formula based on SCr of 0.66 mg/dL). Liver Function Tests: Recent Labs  Lab 04/24/17 2054 04/26/17 0311  AST 26 25  ALT 22 22  ALKPHOS 132* 79  BILITOT 0.9 0.7  PROT 7.4 5.7*  ALBUMIN 3.6 2.5*   No results for input(s): LIPASE, AMYLASE in the last 168 hours. No results for input(s): AMMONIA in the last 168 hours. Coagulation Profile: No results for input(s): INR, PROTIME in the last 168 hours. Cardiac Enzymes: Recent Labs  Lab 04/25/17 0555 04/25/17 1056 04/25/17 2009  TROPONINI 0.24* 0.03* <0.03   BNP (last 3 results) No results for input(s): PROBNP in the last 8760 hours. HbA1C: No results for input(s): HGBA1C in the last 72 hours. CBG: Recent Labs  Lab 04/30/17 1231 04/30/17 1600 04/30/17 2123 05/01/17 0738 05/01/17 0817  GLUCAP 144* 121* 100* 65 74   Lipid Profile: No results for input(s): CHOL, HDL, LDLCALC, TRIG, CHOLHDL, LDLDIRECT in the last 72 hours. Thyroid Function Tests: No results for input(s): TSH, T4TOTAL, FREET4, T3FREE, THYROIDAB in the last 72 hours. Anemia Panel: No results for input(s): VITAMINB12, FOLATE, FERRITIN, TIBC, IRON, RETICCTPCT in the last 72 hours. Sepsis Labs: Recent Labs  Lab 04/25/17 1643 04/25/17 2009 04/25/17 2258 04/26/17 1242  LATICACIDVEN  2.5* 2.3* 2.6* 2.0*    Recent Results (from the past 240 hour(s))  Culture, blood (routine x 2)     Status: None   Collection Time: 04/24/17 11:41 PM  Result Value Ref Range Status   Specimen Description BLOOD LEFT ANTECUBITAL  Final   Special Requests   Final    BOTTLES DRAWN AEROBIC AND ANAEROBIC Blood Culture results may not be optimal due to an inadequate volume of blood received in culture bottles   Culture   Final    NO GROWTH 5 DAYS Performed at [facility] [address] [place], [place] [address]    Report Status 04/30/2017 FINAL  Final  MRSA PCR  Screening     Status: None   Collection Time: 04/25/17  3:41 PM  Result Value Ref Range Status   MRSA by PCR NEGATIVE NEGATIVE Final    Comment:        The GeneXpert MRSA Assay (FDA approved for NASAL specimens only), is one component of a comprehensive MRSA colonization surveillance program. It is not intended to diagnose MRSA infection nor to guide or monitor treatment for MRSA infections.   Culture, blood (routine x 2)     Status: None (Preliminary result)   Collection Time: 04/25/17  4:43 PM  Result Value Ref Range Status   Specimen Description BLOOD RIGHT ANTECUBITAL  Final   Special Requests IN PEDIATRIC BOTTLE Blood Culture adequate volume  Final   Culture   Final    NO GROWTH 4 DAYS Performed at [facility], [place] [address], [place], [place] [address]    Report Status PENDING  Incomplete  Body fluid culture     Status: None (Preliminary result)   Collection Time: 04/26/17 12:24 PM  Result Value Ref Range Status   Specimen Description PLEURAL LEFT  Final   Special Requests NONE  Final   Gram Stain   Final    ABUNDANT WBC PRESENT, PREDOMINANTLY PMN MODERATE GRAM POSITIVE COCCI IN PAIRS IN CHAINS Performed at [facility], [place] [address], [place], [place] [address]    Culture RARE VIRIDANS STREPTOCOCCUS  Final   Report Status PENDING  Incomplete  Fungus Culture With Stain     Status: None (Preliminary result)   Collection Time: 04/26/17 12:24 PM  Result Value Ref Range Status   Fungus Stain Final report  Final    Comment: (NOTE) Performed At: [facility] [place], [place] [id number] Rush Farmer MD [id number]    Fungus (Mycology) Culture PENDING  Incomplete   Fungal Source PLEURAL  Final    Comment: LEFT  Fungus Culture Result     Status: None   Collection Time: 04/26/17 12:24 PM  Result Value Ref Range Status   Result 1 Comment  Final    Comment: (NOTE) KOH/Calcofluor preparation:  no fungus observed. Performed At:  [facility] [place], [place] [id number] Rush Farmer MD [id number]   Gram stain     Status: None   Collection Time: 04/30/17 11:14 AM  Result Value Ref Range Status   Specimen Description FLUID PLEURAL LEFT  Final   Special Requests NONE  Final   Gram Stain   Final    ABUNDANT WBC PRESENT, PREDOMINANTLY PMN NO ORGANISMS SEEN Performed at [facility], [address] [place], [place] [address]    Report Status 04/30/2017 FINAL  Final         Radiology Studies: Dg Chest 1 View  Result Date: 04/29/2017 CLINICAL DATA:  History diabetes. History of smoking. Follow-up exam. Recent thoracentesis. EXAM: CHEST 1 VIEW COMPARISON:  None. FINDINGS: Stable cardiomegaly. Diffuse left lung infiltrate. Left pleural effusion appears to have recurred. Residual left apical pneumothorax may remain. IMPRESSION: 1. Large left pleural effusion appears to have recurred. Residual left apical pneumothorax may remain. 2. Persistent left lung infiltrate. 3. Stable cardiomegaly . Electronically Signed   By: Marcello Moores  Register   On: 04/29/2017 14:53   Ct Chest W Contrast  Result Date: 04/30/2017 CLINICAL DATA:  Inpatient. 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 Patient admitted with multifocal pneumonia, large left pleural effusion, sepsis and respiratory failure. Status post left thoracentesis earlier today. EXAM: CT CHEST WITH CONTRAST TECHNIQUE: Multidetector CT imaging of the chest was performed during intravenous contrast administration. CONTRAST:  39mL ISOVUE-300 IOPAMIDOL (ISOVUE-300) INJECTION 61% COMPARISON:  Chest radiograph from earlier today. 04/24/2017 chest CT angiogram. FINDINGS: Cardiovascular: Normal heart size. Trace pericardial effusion/thickening, stable. Atherosclerotic nonaneurysmal thoracic aorta. Normal caliber main pulmonary artery. No central pulmonary emboli.  Significant extrinsic narrowing of the central left pulmonary artery is by tumor is not appreciably changed since 04/24/2017. Mediastinum/Nodes: No discrete thyroid nodules. No acute esophageal abnormality. No axillary adenopathy. Asymmetric mildly prominent 0.7 cm left internal mammary node (series 2/ image 58), previously 0.5 cm on 04/24/2017, minimally increased. Mildly prominent 0.9 cm left pericardiophrenic node (series 2/ image 123), previously 0.5 cm on 04/24/2017, mildly increased. No right hilar adenopathy. Lungs/Pleura: There is a poorly marginated infiltrative partially cavitary central left lung mass measuring approximately 10.9 x 6.6 cm (series 2/ image 74), which is contiguous with the left hilar and left subcarinal nodal territories, which appears mildly increased from 10.6 x 5.7 cm on 04/24/2017 using similar measurement technique. Persistent occluded left upper lobe bronchus with complete left upper lobe atelectasis. Patchy consolidation and volume loss throughout the left lower lobe has worsened since 04/24/2017. Patchy consolidation and ground-glass attenuation in the dependent right upper lobe and dependent right middle lobe and patchy ground-glass centrilobular nodularity in the right lower lobe of fall worsened since 04/24/2017 chest CT. Large loculated left hydropneumothorax with multiple fluid levels, overall stable in size since 04/24/2017 chest CT, with new left pneumothorax component and decreased left pleural fluid component. Trace dependent right pleural effusion with mild dependent right lower lobe atelectasis, new. Underlying moderate centrilobular and paraseptal emphysema with diffuse bronchial wall thickening. Stable calcified subcentimeter dependent basilar right lower lobe granuloma. Upper abdomen: Cholelithiasis. Musculoskeletal:  No aggressive appearing focal osseous lesions. IMPRESSION: 1. Worsening bilateral multilobar pneumonia. 2. Large left hydropneumothorax, overall stable  in size since 04/24/2017 chest CT, with new left pneumothorax component and with decreased left pleural fluid component status post interval thoracentesis. 3. Infiltrative poorly marginated partially cavitary central left lung malignancy with left hilar and subcarinal mediastinal invasion, apparently mildly increased in size in the interval. Stable occluded left upper lobe bronchus with complete left upper lobe atelectasis. 4. Nonspecific mild left internal mammary and left pericardiophrenic mediastinal adenopathy is mildly increased. 5. New trace dependent right pleural effusion. 6. Cholelithiasis. Aortic Atherosclerosis (ICD10-I70.0) and Emphysema (ICD10-J43.9). Electronically Signed   By: Ilona Sorrel M.D.   On: 04/30/2017 12:37   Dg Chest Port 1 View  Result Date: 05/01/2017 CLINICAL DATA:  Hydropneumothorax. EXAM: PORTABLE CHEST 1 VIEW COMPARISON:  One-view chest x-ray 04/30/2017. CT of the chest 04/30/2017. FINDINGS: A complex left-sided hydropneumothorax is not significantly changed. Complex left-sided  airspace disease is again noted. With the patient more upright, the air component is better seen at the apex. The heart size is normal. Interstitial coarsening in the right lung is stable. IMPRESSION: 1. Stable complex left-sided hydropneumothorax and pneumonia. Electronically Signed   By: San Morelle M.D.   On: 05/01/2017 07:09   Dg Chest Port 1 View  Result Date: 04/30/2017 CLINICAL DATA:  Left-sided hydropneumothorax EXAM: PORTABLE CHEST 1 VIEW COMPARISON:  Chest CT April 30, 2016 and chest radiograph April 30, 2016 FINDINGS: Extensive changes of hydropneumothorax on the left remain without apparent change compared to earlier in the day. Cavitary lesion in the left perihilar region is again noted. CT shows this area to represent a cavitary mass. There is airspace consolidation in the left mid lung areas not directly involved with hydropneumothorax. There are patchy areas of opacity  in the right mid lung and right base region, better seen on CT. Heart size is normal. The pulmonary vascularity on the right is within normal limits. Pulmonary vascularity on the left is distorted. There is aortic atherosclerosis. No bone lesions. IMPRESSION: Hydropneumothorax remains on the left without appreciable change. Areas of consolidation primarily in the left mid lung remain. Mass with cavitation in the left perihilar region persists without appreciable change compared to earlier in the day. Patchy infiltrate right mid lung and right base. Stable cardiac silhouette. There is aortic atherosclerosis. Aortic Atherosclerosis (ICD10-I70.0). Electronically Signed   By: Lowella Grip III M.D.   On: 04/30/2017 20:25   Dg Chest Port 1 View  Result Date: 04/30/2017 CLINICAL DATA:  Empyema EXAM: PORTABLE CHEST 1 VIEW COMPARISON:  04/29/2017 FINDINGS: Large left-sided pleural effusion is again identified. There is some lucency noted in the left apex likely representing a component of hydropneumothorax stable from the previous exam. Patchy infiltrative changes are noted throughout the aerated left lung. The right lung is clear. Cardiac shadow is stable. No bony abnormality is noted. IMPRESSION: Stable left pleural effusion and likely component of hydropneumothorax. No new focal abnormality is noted. Stable left-sided infiltrates. Electronically Signed   By: Inez Catalina M.D.   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 Request is made for repeat thoracentesis a plan for CT scan of the chest to follow the plan for possible need for drain placement. EXAM: ULTRASOUND GUIDED DIAGNOSTIC AND THERAPEUTIC THORACENTESIS COMPARISON:  Chest radiograph - 04/30/2017; 04/26/2017; ultrasound-guided thoracentesis - 04/26/2017  MEDICATIONS: 1% lidocaine COMPLICATIONS: SIR Level A - No therapy, no consequence. Procedure complicated by development of an asymptomatic ex vacuo pneumothorax as demonstrated on prior ultrasound-guided thoracentesis performed 04/26/2017. PROCEDURE: An ultrasound guided thoracentesis was thoroughly discussed with the patient and questions answered. The benefits, risks, alternatives and complications were also discussed. The patient understands and wishes to proceed with the procedure. Written consent was obtained. Ultrasound was performed to localize and mark an adequate pocket of fluid in the left chest. The area was then prepped and draped in the normal sterile fashion. 1% Lidocaine was used for local anesthesia. Under ultrasound guidance a Safe-T-Centesis catheter was introduced. Thoracentesis was performed. The catheter was removed and a dressing applied. FINDINGS: A total of approximately 2.3 L of slightly cloudy brown, yellow fluid was removed. Samples were sent to the laboratory as requested by the clinical team. IMPRESSION: Successful ultrasound guided left thoracentesis yielding 2.3 L  of pleural fluid. The patient's follow-up CT scan shows a persistent hydropneumothorax that is stable from imaging several days ago to maybe slightly increased. The patient remains asymptomatic. No intervention is required at this time as it appears his lung is trapped. We will await cultures to determine if this fluid is still infected. If it is, he will likely require a drain placement for continued drainage. If his cultures are negative then he will likely require no further intervention. Read by: Saverio Danker, PA-C Electronically Signed   By: Sandi Mariscal M.D.   On: 04/30/2017 14:00        Scheduled Meds: . amiodarone  400 mg Oral BID  . budesonide (PULMICORT) nebulizer solution  0.25 mg Nebulization BID  . chlorhexidine  15 mL Mouth Rinse BID  . enoxaparin (LOVENOX) injection  40 mg Subcutaneous QHS  .  feeding supplement  1 Container Oral BID BM  . insulin aspart  0-9 Units Subcutaneous TID WC  . ipratropium  0.5 mg Nebulization Q6H  . levalbuterol  0.63 mg Nebulization Q6H  . mouth rinse  15 mL Mouth Rinse q12n4p  . metoprolol tartrate  25 mg Oral BID   Continuous Infusions: . sodium chloride 100 mL/hr at 05/01/17 0500  . amiodarone Stopped (04/30/17 1012)  . cefTRIAXone (ROCEPHIN)  IV Stopped (04/30/17 1418)     LOS: 6 days    Time spent: 25 mins    Amiree No Jodie Echevaria, MD [facility] Pager [phone]  If 7PM-7AM, please contact night-coverage [email or web address] Password [email or web address] 05/01/2017, 1:01 PM

## 2017-05-01 NOTE — Progress Notes (Signed)
Hypoglycemic Event  CBG: 65  Treatment: 15 GM carbohydrate snack  Symptoms: None  Follow-up CBG: Time: 0818 CBG Result: 74  Possible Reasons for Event: Inadequate meal intake  Comments/MD notified:Adhikari notified at [facility]

## 2017-05-01 NOTE — Progress Notes (Signed)
[facility] PCCM PROGRESS NOTE  Date of Admission: 04/24/2017 Date of Consult: 04/25/2017 Referring Provider: Dr. Hal Hope, [facility] Chief Complaint: Short of breath  HPI: 76 yo male former smoker presented with dyspnea, hypoxia, and productive cough.  He has hx of Stage IV NSCLC (squamous cell) and started chemotherapy Marita [place], carboplatin, paclitaxel) 3 weeks prior to admission.  He was found to have multifocal pneumonia and large Lt pleural effusion.  Past Medical History: He  has a past medical history of Diabetes mellitus without complication ([place]) and Malignant neoplasm of unspecified part of unspecified bronchus or lung ([place]) (03/30/2017).  Subjective: Breathing okay.  Not having much cough or sputum.  Denies chest pain.  Doesn't remember what happened over past few days.  Vital signs: BP (!) 103/44   Pulse 91   Temp 98.6 F (37 C)   Resp (!) 24   Ht 5\' 11"  (1.803 m)   Wt 166 lb 7.2 oz (75.5 kg)   SpO2 92% Comment: md goal 90-95%  BMI 23.21 kg/m   Intake/Output: I/O last 3 completed shifts: In: 4063.3 [P.O.:390; I.V.:3573.3; IV Piggyback:100] Out: 2625 [Urine:2625]  Physical Exam:  General - cachectic Eyes - pupils reactive ENT - no sinus tenderness, no oral exudate, no LAN Cardiac - regular, no murmur Chest - b/l rhonchi, decreased BS on LT base Abd - soft, non tender Ext - no edema Skin - no rashes Neuro - follows commands  Discussion: 76 yo male with pneumococcal pneumonia and empyema leading to acute hypoxic/hypercapnic respiratory failure in setting of Stage IV NSCLC.  Assessment/Plan:  Acute respiratory failure with hypoxia, hypercapnia. - oxygen to keep SpO2 90 to 95%  Sepsis from multifocal pneumonia with empyema. - ABx per ID - f/u CXR and monitor clinically  - if fluid reaccumulates and he gets sicker, then might need to consider pig tail catheter for continued fluid drainage - not candidate for surgery  Trapped lung. - some of his effusion  could be related to this  Emphysema with hx of tobacco abuse. - scheduled BDs  A fib with RVR. - per cardiology and primary team  Stage 4 NSCLC. - palliative care to meet with family 12/28  DVT prophylaxis: Lovenox SUP: Not indicated Diet: Carb modified, heart healthy Goals of care: Full code.  Palliative care consulted.  PCCM will f/u on Monday 12/31.  Call if help needed sooner.  Chesley Mires, MD [facility] Pulmonary/Critical Care 05/01/2017, 11:24 AM Pager:  [phone] After 3pm call: [phone]  FLOW SHEET  Cultures: Blood 12/21 >>  Blood 12/22 >>  Pneumococcal Ag 12/22 >> positive Legionella Ag 12/22 >> negative Lt pleural fluid >> GPC in chains  Antibiotics: Vancomycin 12/21 >> 12/24  Cefepime 12/21 >> 12/25 Zithromax 12/21 >> 12/25 Rocephin 12/26 >>  Studies: CT angio chest 12/21 >> Lt mediastinal mass with compression of Lt main bronchus and Lt PA, large Lt effusion, ASD on Lt, patchy ASD on Rt, emphysema Echo 12/22 >> EF 60 to 65%, PAS 62 mmHg Lt thoracentesis 12/23 >> 1.4 liters, glucose < 20, protein < 3, WBC 78,475 (93%N) Lt thoracentesis 12/27 >> 2.3 liters CT chest 12/27 >> b/l ASD, Lt hydropneumothorax with trapped lung,    Events: 12/22 Admit  Lines/tubes:  Consults: 12/26 ID 12/26 Oncology 12/26 Cardiology 12/26 Palliative care  Resolved Problems:  Labs: CMP Latest Ref Rng & Units 05/01/2017 04/30/2017 04/29/2017  Glucose 65 - 99 mg/dL 88 176(H) 216(H)  BUN 6 - 20 mg/dL 32(H) 63(H) 51(H)  Creatinine 0.61 - 1.24  mg/dL 0.66 1.79(H) 1.00  Sodium 135 - 145 mmol/L 142 141 141  Potassium 3.5 - 5.1 mmol/L 4.1 5.0 4.8  Chloride 101 - 111 mmol/L 111 113(H) 112(H)  CO2 22 - 32 mmol/L 27 21(L) 22  Calcium 8.9 - 10.3 mg/dL 8.0(L) 8.3(L) 8.4(L)  Total Protein 6.5 - 8.1 g/dL - - -  Total Bilirubin 0.3 - 1.2 mg/dL - - -  Alkaline Phos 38 - 126 U/L - - -  AST 15 - 41 U/L - - -  ALT 17 - 63 U/L - - -    CBC Latest Ref Rng & Units 05/01/2017  04/30/2017 04/29/2017  WBC 4.0 - 10.5 K/uL 16.9(H) 28.6(H) 28.3(H)  Hemoglobin 13.0 - 17.0 g/dL 12.1(L) 13.3 12.7(L)  Hematocrit 39.0 - 52.0 % 38.9(L) 40.8 39.7  Platelets 150 - 400 K/uL 255 311 292    CBG (last 3)  Recent Labs    04/30/17 2123 05/01/17 0738 05/01/17 0817  GLUCAP 100* 65 74    Imaging: Dg Chest 1 View  Result Date: 04/29/2017 CLINICAL DATA:  History diabetes. History of smoking. Follow-up exam. Recent thoracentesis. EXAM: CHEST 1 VIEW COMPARISON:  None. FINDINGS: Stable cardiomegaly. Diffuse left lung infiltrate. Left pleural effusion appears to have recurred. Residual left apical pneumothorax may remain. IMPRESSION: 1. Large left pleural effusion appears to have recurred. Residual left apical pneumothorax may remain. 2. Persistent left lung infiltrate. 3. Stable cardiomegaly . Electronically Signed   By: Marcello Moores  Register   On: 04/29/2017 14:53   Ct Chest W Contrast  Result Date: 04/30/2017 CLINICAL DATA:  Inpatient. 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 Patient admitted with multifocal pneumonia, large left pleural effusion, sepsis and respiratory failure. Status post left thoracentesis earlier today. EXAM: CT CHEST WITH CONTRAST TECHNIQUE: Multidetector CT imaging of the chest was performed during intravenous contrast administration. CONTRAST:  53mL ISOVUE-300 IOPAMIDOL (ISOVUE-300) INJECTION 61% COMPARISON:  Chest radiograph from earlier today. 04/24/2017 chest CT angiogram. FINDINGS: Cardiovascular: Normal heart size. Trace pericardial effusion/thickening, stable. Atherosclerotic nonaneurysmal thoracic aorta. Normal caliber main pulmonary artery. No central pulmonary emboli. Significant extrinsic narrowing of the central left pulmonary artery is by tumor is not appreciably changed since 04/24/2017. Mediastinum/Nodes: No discrete thyroid nodules. No acute esophageal  abnormality. No axillary adenopathy. Asymmetric mildly prominent 0.7 cm left internal mammary node (series 2/ image 58), previously 0.5 cm on 04/24/2017, minimally increased. Mildly prominent 0.9 cm left pericardiophrenic node (series 2/ image 123), previously 0.5 cm on 04/24/2017, mildly increased. No right hilar adenopathy. Lungs/Pleura: There is a poorly marginated infiltrative partially cavitary central left lung mass measuring approximately 10.9 x 6.6 cm (series 2/ image 74), which is contiguous with the left hilar and left subcarinal nodal territories, which appears mildly increased from 10.6 x 5.7 cm on 04/24/2017 using similar measurement technique. Persistent occluded left upper lobe bronchus with complete left upper lobe atelectasis. Patchy consolidation and volume loss throughout the left lower lobe has worsened since 04/24/2017. Patchy consolidation and ground-glass attenuation in the dependent right upper lobe and dependent right middle lobe and patchy ground-glass centrilobular nodularity in the right lower lobe of fall worsened since 04/24/2017 chest CT. Large loculated left hydropneumothorax with multiple fluid levels, overall stable in size since 04/24/2017 chest CT, with new left pneumothorax component and decreased left pleural fluid component. Trace dependent right pleural effusion with mild dependent right lower lobe atelectasis, new. Underlying moderate centrilobular and paraseptal emphysema with diffuse bronchial wall  thickening. Stable calcified subcentimeter dependent basilar right lower lobe granuloma. Upper abdomen: Cholelithiasis. Musculoskeletal:  No aggressive appearing focal osseous lesions. IMPRESSION: 1. Worsening bilateral multilobar pneumonia. 2. Large left hydropneumothorax, overall stable in size since 04/24/2017 chest CT, with new left pneumothorax component and with decreased left pleural fluid component status post interval thoracentesis. 3. Infiltrative poorly marginated  partially cavitary central left lung malignancy with left hilar and subcarinal mediastinal invasion, apparently mildly increased in size in the interval. Stable occluded left upper lobe bronchus with complete left upper lobe atelectasis. 4. Nonspecific mild left internal mammary and left pericardiophrenic mediastinal adenopathy is mildly increased. 5. New trace dependent right pleural effusion. 6. Cholelithiasis. Aortic Atherosclerosis (ICD10-I70.0) and Emphysema (ICD10-J43.9). Electronically Signed   By: Ilona Sorrel M.D.   On: 04/30/2017 12:37   Dg Chest Port 1 View  Result Date: 05/01/2017 CLINICAL DATA:  Hydropneumothorax. EXAM: PORTABLE CHEST 1 VIEW COMPARISON:  One-view chest x-ray 04/30/2017. CT of the chest 04/30/2017. FINDINGS: A complex left-sided hydropneumothorax is not significantly changed. Complex left-sided airspace disease is again noted. With the patient more upright, the air component is better seen at the apex. The heart size is normal. Interstitial coarsening in the right lung is stable. IMPRESSION: 1. Stable complex left-sided hydropneumothorax and pneumonia. Electronically Signed   By: San Morelle M.D.   On: 05/01/2017 07:09   Dg Chest Port 1 View  Result Date: 04/30/2017 CLINICAL DATA:  Left-sided hydropneumothorax EXAM: PORTABLE CHEST 1 VIEW COMPARISON:  Chest CT April 30, 2016 and chest radiograph April 30, 2016 FINDINGS: Extensive changes of hydropneumothorax on the left remain without apparent change compared to earlier in the day. Cavitary lesion in the left perihilar region is again noted. CT shows this area to represent a cavitary mass. There is airspace consolidation in the left mid lung areas not directly involved with hydropneumothorax. There are patchy areas of opacity in the right mid lung and right base region, better seen on CT. Heart size is normal. The pulmonary vascularity on the right is within normal limits. Pulmonary vascularity on the left is  distorted. There is aortic atherosclerosis. No bone lesions. IMPRESSION: Hydropneumothorax remains on the left without appreciable change. Areas of consolidation primarily in the left mid lung remain. Mass with cavitation in the left perihilar region persists without appreciable change compared to earlier in the day. Patchy infiltrate right mid lung and right base. Stable cardiac silhouette. There is aortic atherosclerosis. Aortic Atherosclerosis (ICD10-I70.0). Electronically Signed   By: Lowella Grip III M.D.   On: 04/30/2017 20:25   Dg Chest Port 1 View  Result Date: 04/30/2017 CLINICAL DATA:  Empyema EXAM: PORTABLE CHEST 1 VIEW COMPARISON:  04/29/2017 FINDINGS: Large left-sided pleural effusion is again identified. There is some lucency noted in the left apex likely representing a component of hydropneumothorax stable from the previous exam. Patchy infiltrative changes are noted throughout the aerated left lung. The right lung is clear. Cardiac shadow is stable. No bony abnormality is noted. IMPRESSION: Stable left pleural effusion and likely component of hydropneumothorax. No new focal abnormality is noted. Stable left-sided infiltrates. Electronically Signed   By: Inez Catalina M.D.   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 Request is made for repeat thoracentesis a plan for CT scan of the chest to follow the plan for possible need for  drain placement. EXAM: ULTRASOUND GUIDED DIAGNOSTIC AND THERAPEUTIC THORACENTESIS COMPARISON:  Chest radiograph - 04/30/2017; 04/26/2017; ultrasound-guided thoracentesis - 04/26/2017 MEDICATIONS: 1% lidocaine COMPLICATIONS: SIR Level A - No therapy, no consequence. Procedure complicated by development of an asymptomatic ex vacuo pneumothorax as demonstrated on prior  ultrasound-guided thoracentesis performed 04/26/2017. PROCEDURE: An ultrasound guided thoracentesis was thoroughly discussed with the patient and questions answered. The benefits, risks, alternatives and complications were also discussed. The patient understands and wishes to proceed with the procedure. Written consent was obtained. Ultrasound was performed to localize and mark an adequate pocket of fluid in the left chest. The area was then prepped and draped in the normal sterile fashion. 1% Lidocaine was used for local anesthesia. Under ultrasound guidance a Safe-T-Centesis catheter was introduced. Thoracentesis was performed. The catheter was removed and a dressing applied. FINDINGS: A total of approximately 2.3 L of slightly cloudy brown, yellow fluid was removed. Samples were sent to the laboratory as requested by the clinical team. IMPRESSION: Successful ultrasound guided left thoracentesis yielding 2.3 L of pleural fluid. The patient's follow-up CT scan shows a persistent hydropneumothorax that is stable from imaging several days ago to maybe slightly increased. The patient remains asymptomatic. No intervention is required at this time as it appears his lung is trapped. We will await cultures to determine if this fluid is still infected. If it is, he will likely require a drain placement for continued drainage. If his cultures are negative then he will likely require no further intervention. Read by: Saverio Danker, PA-C Electronically Signed   By: Sandi Mariscal M.D.   On: 04/30/2017 14:00

## 2017-05-01 NOTE — Telephone Encounter (Signed)
Called and spoke with RN taking care of Patient in 1233 , blood sugar low, and low b/p, palliative care speaking with family and patient  this am between 10-1030am, patient sleepy, will call after pallaitive team speaks to see if patient still wants radiation , Dr. Lisbeth Renshaw informed and spoke with Anderson Malta RT Therapist on linac#3 8:22 AM

## 2017-05-01 NOTE — Progress Notes (Signed)
[facility] for Infectious Disease    Date of Admission:  04/24/2017    Total days of antibiotics 8   Day 3 ceftriaxone   ID: Maurice Tyler is a 76 y.o. 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   24hr event - started on palliative care discussion   Lab Results Recent Labs    04/30/17 0320 05/01/17 0317  WBC 28.6* 16.9*  HGB 13.3 12.1*  HCT 40.8 38.9*  NA 141 142  K 5.0 4.1  CL 113* 111  CO2 21* 27  BUN 63* 32*  CREATININE 1.79* 0.66    Microbiology: 12/27 pleural fluid pending cx Studies/Results: Ct Chest W Contrast  Result Date: 04/30/2017 CLINICAL DATA:  Inpatient. 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 Patient admitted with multifocal pneumonia, large left pleural effusion, sepsis and respiratory failure. Status post left thoracentesis earlier today. EXAM: CT CHEST WITH CONTRAST TECHNIQUE: Multidetector CT imaging of the chest was performed during intravenous contrast administration. CONTRAST:  23mL ISOVUE-300 IOPAMIDOL (ISOVUE-300) INJECTION 61% COMPARISON:  Chest radiograph from earlier today. 04/24/2017 chest CT angiogram. FINDINGS: Cardiovascular: Normal heart size. Trace pericardial effusion/thickening, stable. Atherosclerotic nonaneurysmal thoracic aorta. Normal caliber main pulmonary artery. No central pulmonary emboli. Significant extrinsic narrowing of the central  left pulmonary artery is by tumor is not appreciably changed since 04/24/2017. Mediastinum/Nodes: No discrete thyroid nodules. No acute esophageal abnormality. No axillary adenopathy. Asymmetric mildly prominent 0.7 cm left internal mammary node (series 2/ image 58), previously 0.5 cm on 04/24/2017, minimally increased. Mildly prominent 0.9 cm left pericardiophrenic node (series 2/ image 123), previously 0.5 cm on 04/24/2017, mildly increased. No right hilar adenopathy. Lungs/Pleura: There is a poorly marginated infiltrative partially cavitary central left lung mass measuring approximately 10.9 x 6.6 cm (series 2/ image 74), which is contiguous with the left hilar and left subcarinal nodal territories, which appears mildly increased from 10.6 x 5.7 cm on 04/24/2017 using similar measurement technique. Persistent occluded left upper lobe bronchus with complete left upper lobe atelectasis. Patchy consolidation and volume loss throughout the left lower lobe has worsened since 04/24/2017. Patchy consolidation and ground-glass attenuation in the dependent right upper lobe and dependent right middle lobe and patchy ground-glass centrilobular nodularity in the right lower lobe of fall worsened since 04/24/2017 chest CT. Large loculated left hydropneumothorax with multiple fluid levels, overall stable in size since 04/24/2017 chest CT, with new left pneumothorax component and decreased left pleural fluid component. Trace dependent right pleural effusion with mild dependent right lower lobe atelectasis, new. Underlying moderate centrilobular and paraseptal emphysema with diffuse bronchial wall thickening. Stable calcified subcentimeter dependent basilar right lower lobe granuloma. Upper abdomen: Cholelithiasis. Musculoskeletal:  No aggressive appearing focal osseous lesions. IMPRESSION: 1. Worsening bilateral multilobar pneumonia. 2. Large left hydropneumothorax, overall stable in size since 04/24/2017 chest CT, with new  left pneumothorax component and with decreased left pleural fluid component status post interval thoracentesis. 3. Infiltrative poorly marginated partially cavitary central left lung  malignancy with left hilar and subcarinal mediastinal invasion, apparently mildly increased in size in the interval. Stable occluded left upper lobe bronchus with complete left upper lobe atelectasis. 4. Nonspecific mild left internal mammary and left pericardiophrenic mediastinal adenopathy is mildly increased. 5. New trace dependent right pleural effusion. 6. Cholelithiasis. Aortic Atherosclerosis (ICD10-I70.0) and Emphysema (ICD10-J43.9). Electronically Signed   By: Ilona Sorrel M.D.   On: 04/30/2017 12:37   Dg Chest Port 1 View  Result Date: 05/01/2017 CLINICAL DATA:  Hydropneumothorax. EXAM: PORTABLE CHEST 1 VIEW COMPARISON:  One-view chest x-ray 04/30/2017. CT of the chest 04/30/2017. FINDINGS: A complex left-sided hydropneumothorax is not significantly changed. Complex left-sided airspace disease is again noted. With the patient more upright, the air component is better seen at the apex. The heart size is normal. Interstitial coarsening in the right lung is stable. IMPRESSION: 1. Stable complex left-sided hydropneumothorax and pneumonia. Electronically Signed   By: San Morelle M.D.   On: 05/01/2017 07:09   Dg Chest Port 1 View  Result Date: 04/30/2017 CLINICAL DATA:  Left-sided hydropneumothorax EXAM: PORTABLE CHEST 1 VIEW COMPARISON:  Chest CT April 30, 2016 and chest radiograph April 30, 2016 FINDINGS: Extensive changes of hydropneumothorax on the left remain without apparent change compared to earlier in the day. Cavitary lesion in the left perihilar region is again noted. CT shows this area to represent a cavitary mass. There is airspace consolidation in the left mid lung areas not directly involved with hydropneumothorax. There are patchy areas of opacity in the right mid lung and right base region,  better seen on CT. Heart size is normal. The pulmonary vascularity on the right is within normal limits. Pulmonary vascularity on the left is distorted. There is aortic atherosclerosis. No bone lesions. IMPRESSION: Hydropneumothorax remains on the left without appreciable change. Areas of consolidation primarily in the left mid lung remain. Mass with cavitation in the left perihilar region persists without appreciable change compared to earlier in the day. Patchy infiltrate right mid lung and right base. Stable cardiac silhouette. There is aortic atherosclerosis. Aortic Atherosclerosis (ICD10-I70.0). Electronically Signed   By: Lowella Grip III M.D.   On: 04/30/2017 20:25   Dg Chest Port 1 View  Result Date: 04/30/2017 CLINICAL DATA:  Empyema EXAM: PORTABLE CHEST 1 VIEW COMPARISON:  04/29/2017 FINDINGS: Large left-sided pleural effusion is again identified. There is some lucency noted in the left apex likely representing a component of hydropneumothorax stable from the previous exam. Patchy infiltrative changes are noted throughout the aerated left lung. The right lung is clear. Cardiac shadow is stable. No bony abnormality is noted. IMPRESSION: Stable left pleural effusion and likely component of hydropneumothorax. No new focal abnormality is noted. Stable left-sided infiltrates. Electronically Signed   By: Inez Catalina M.D.   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 Request is made for repeat thoracentesis a plan for CT scan of the chest to follow the plan for possible need for drain placement. EXAM: ULTRASOUND GUIDED DIAGNOSTIC AND THERAPEUTIC THORACENTESIS COMPARISON:  Chest radiograph - 04/30/2017; 04/26/2017; ultrasound-guided thoracentesis - 04/26/2017 MEDICATIONS: 1% lidocaine COMPLICATIONS: SIR  Level A - No therapy, no consequence. Procedure complicated by development of an asymptomatic ex vacuo pneumothorax as demonstrated on prior ultrasound-guided thoracentesis performed 04/26/2017. PROCEDURE: An ultrasound guided thoracentesis was thoroughly discussed with the patient and questions answered.  The benefits, risks, alternatives and complications were also discussed. The patient understands and wishes to proceed with the procedure. Written consent was obtained. Ultrasound was performed to localize and mark an adequate pocket of fluid in the left chest. The area was then prepped and draped in the normal sterile fashion. 1% Lidocaine was used for local anesthesia. Under ultrasound guidance a Safe-T-Centesis catheter was introduced. Thoracentesis was performed. The catheter was removed and a dressing applied. FINDINGS: A total of approximately 2.3 L of slightly cloudy brown, yellow fluid was removed. Samples were sent to the laboratory as requested by the clinical team. IMPRESSION: Successful ultrasound guided left thoracentesis yielding 2.3 L of pleural fluid. The patient's follow-up CT scan shows a persistent hydropneumothorax that is stable from imaging several days ago to maybe slightly increased. The patient remains asymptomatic. No intervention is required at this time as it appears his lung is trapped. We will await cultures to determine if this fluid is still infected. If it is, he will likely require a drain placement for continued drainage. If his cultures are negative then he will likely require no further intervention. Read by: Saverio Danker, PA-C Electronically Signed   By: Sandi Mariscal M.D.   On: 04/30/2017 14:00     Assessment/Plan: 76yo M with newly diagnosed stage IV NSCLC on palliative chemo complicated by pneumococcal pneumonia and empyema leading to acute hypoxic/hypercapnic respiratory failure --  Continue with ceftriaxone 2gm Iv daily to treat streptococcal multifocal pneumonia  and empyema  Chest Ct suggest large hydropneumothorax with likely trapped lung due to underlying disease process. May need drain placement but will determine based upon repeat cxr to see if he reaccumulates and goals of care  Leukocytosis = trended down since large volume thoracentesis  Stage IV NSCLC = appreciate palliative care consultation for goals of care  Prognosis = poor due to severe deconditioning.    [facility] for Infectious Diseases Cell: [phone] Pager: [phone]  05/01/2017, 4:44 PM

## 2017-05-01 NOTE — Progress Notes (Signed)
Nutrition Follow-up  DOCUMENTATION CODES:   Non-severe (moderate) malnutrition in context of chronic illness  INTERVENTION:  - Will d/c Glucerna since pt is not consuming. - Will trial Carnation Instant Breakfast BID, each packet provides 220 kcal and 5 grams of protein. Will order with whole milk.  - Will trial Boost Breeze BID, each supplement provides 250 kcal and 9 grams of protein - Continue to encourage PO intakes of meals and supplements.   NUTRITION DIAGNOSIS:   Moderate Malnutrition related to chronic illness, cancer and cancer related treatments as evidenced by percent weight loss, energy intake < or equal to 50% for > or equal to 1 month, moderate fat depletion, moderate muscle depletion -ongoing  GOAL:   Patient will meet greater than or equal to 90% of their needs -unmet  MONITOR:   PO intake, Supplement acceptance, Weight trends, Labs, Skin  ASSESSMENT:   Pt with PMH significant for DM and recently diagnosed with non-small cell lung carcinoma. Pt started chemotherapy last week and has had increasing shortness of breath with productive cough since. Admitted for acute respiratory failure with sepsis from multifocal PNA and large left PE.   12/28 Per chart review, pt consumed 30% of breakfast and 5% of dinner on 12/25; 10% of breakfast and 15% of lunch on 12/27. Pt is sleeping soundly at this time with no family/visitors present and RD did not feel it was necessary to awake him. Breakfast tray was at bedside and he had consumed 100% of cup of orange juice, grits, scrambled eggs, and bagel were all untouched. Review of orders indicates that pt has been refusing/not consuming any of the Glucerna shakes since order was placed on 12/24. Per notes, Palliative to meet with pt and family today and further decision will be made concerning whether or not radiation is going to be continued. Pt with stage 4 NSCLC. Current weight +12 lbs since admission; will continue to monitor for  weight trends.  Medications reviewed; sliding scale Novolog.  Labs reviewed; CBGs: 65 and 74 mg/dL today, BUN: 32 mg/dL, Ca: 8 mg/dL.   IVF: NS @ 100 mL/hr.     12/24 - Spoke with pt at bedside who reports having a loss in appetite for the last three days related to increasingly worse cough.  - He typically eats smaller meals throughout the day with at least two Glucerna's per day.  - Pt admits to having swallowing issues but is actively being seen for this.  - Cannot tolerate certain textures like dry meats or stringy vegetables.  - Suspect PO intake has decreased due to swallowing issues as well.  - Pt would like to continue Glucerna this stay, and is requesting lemonade.  - Pt reports a UBW of 180 lb, the last time being at that weight in November 2018.  - Records indicate pt weighed 182 lb 03/10/17 and 154 lb this admission.  - This shows a 15.4 % wt loss in one month; this is significant for time frame.     Diet Order:  Diet heart healthy/carb modified Room service appropriate? Yes; Fluid consistency: Thin  EDUCATION NEEDS:   Education needs have been addressed  Skin:  Skin Assessment: Skin Integrity Issues: Skin Integrity Issues:: Stage I Stage I: buttocks  Last BM:  12/27  Height:   Ht Readings from Last 1 Encounters:  04/25/17 5\' 11"  (1.803 m)    Weight:   Wt Readings from Last 1 Encounters:  05/01/17 166 lb 7.2 oz (75.5 kg)  Ideal Body Weight:  78.2 kg  BMI:  Body mass index is 23.21 kg/m.  Estimated Nutritional Needs:   Kcal:  2000-2200 kcal/day  Protein:  100-105 g/day  Fluid:  >2 L/day      Jarome Matin, MS, RD, LDN, [facility] Inpatient Clinical Dietitian Pager # [phone] After hours/weekend pager # [phone]

## 2017-05-01 NOTE — Progress Notes (Signed)
Added Sterile Water to [place] 02 system.

## 2017-05-01 NOTE — Progress Notes (Signed)
Daily Progress Note   Patient Name: Maurice Tyler       Date: 05/01/2017 DOB: 1940/12/16  Age: 76 y.o. MRN#: [id number] Attending Physician: Marene Lenz, MD Primary Care Physician: [name], MD Admit Date: 04/24/2017  Reason for Consultation/Follow-up: Establishing goals of care  Subjective: Mr. Devincent is resting today. No family at bedside.   Length of Stay: 6  Current Medications: Scheduled Meds:  . amiodarone  400 mg Oral BID  . budesonide (PULMICORT) nebulizer solution  0.25 mg Nebulization BID  . chlorhexidine  15 mL Mouth Rinse BID  . enoxaparin (LOVENOX) injection  40 mg Subcutaneous QHS  . feeding supplement  1 Container Oral BID BM  . insulin aspart  0-9 Units Subcutaneous TID WC  . ipratropium  0.5 mg Nebulization Q6H  . levalbuterol  0.63 mg Nebulization Q6H  . mouth rinse  15 mL Mouth Rinse q12n4p  . metoprolol tartrate  25 mg Oral BID    Continuous Infusions: . sodium chloride 100 mL/hr at 05/01/17 0500  . amiodarone Stopped (04/30/17 1012)  . cefTRIAXone (ROCEPHIN)  IV Stopped (04/30/17 1418)    PRN Meds: acetaminophen **OR** acetaminophen, levalbuterol, loperamide, LORazepam, metoprolol tartrate, ondansetron **OR** ondansetron (ZOFRAN) IV, prochlorperazine  Physical Exam         Constitutional: Vital signs are normal. He has a sickly appearance.  Thin, frail  Cardiovascular: Normal rate and regular rhythm.  Pulmonary/Chest: No accessory muscle usage. No tachypnea. No respiratory distress. He has decreased breath sounds in the left upper field, the left middle field and the left lower field.  Abdominal: Soft. Normal appearance.  Neurological: Alert, oriented x 4  Nursing note and vitals reviewed.   Vital Signs: BP (!) 103/44   Pulse  91   Temp 98.6 F (37 C)   Resp (!) 24   Ht 5' 11" (1.803 m)   Wt 75.5 kg (166 lb 7.2 oz)   SpO2 92% Comment: md goal 90-95%  BMI 23.21 kg/m  SpO2: SpO2: 92 %(md goal 90-95%) O2 Device: O2 Device: High Flow Nasal Cannula([place]ter) O2 Flow Rate: O2 Flow Rate (L/min): 6 L/min  Intake/output summary:   Intake/Output Summary (Last 24 hours) at 05/01/2017 1247 Last data filed at 05/01/2017 0500 Gross per 24 hour  Intake 1850 ml  Output 1325 ml  Net 525  ml   LBM: Last BM Date: 04/30/17 Baseline Weight: Weight: 69.9 kg (154 lb 1.6 oz) Most recent weight: Weight: 75.5 kg (166 lb 7.2 oz)       Palliative Assessment/Data: 30%     Patient Active Problem List   Diagnosis Date Noted  . New onset a-fib ([place]) 04/30/2017  . AKI (acute kidney injury) ([place]) 04/30/2017  . Non-small cell cancer of left lung ([place])   . Goals of care, counseling/discussion   . Palliative care encounter   . Empyema of right pleural space ([place]) 04/29/2017  . Malnutrition of moderate degree 04/28/2017  . Pressure injury of skin 04/28/2017  . Diabetes mellitus type 2 in nonobese ([place]) 04/25/2017  . Acute respiratory failure with hypoxia and hypercapnia ([place]) 04/25/2017  . Severe sepsis ([place]) 04/25/2017  . Multifocal pneumonia 04/25/2017  . Malignant neoplasm of bronchus of left upper lobe ([place]) 03/30/2017  . 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 male  with past medical history of diabetes mellitus type II, stage IV NSCLC with obstructive LUL mass as well as mediastinal and left hilar adenopathy diagnosed recently with 1 treatment with carboplatin, paclitaxel and Keytruda 04/15/17 admitted on 04/24/2017 with worsening shortness of breath along with productive cough, diarrhea, and failure to thrive with significant weight loss.   Assessment: I met today with Mr. Navarro. His wife called later and forgot we were too meet this  morning. I met alone with Mr. Rabbani. He seems to have good understanding of the severity of his illness.   His eyes become tearful talking about his family and talking about "having to have more chemo." He tells me how terrible his first treatment was on him and how he is unsure that he will be able to tolerate any more chemo. We discussed that this is always his decision and that there is no right or wrong decision. We also discussed that sometimes that choice is made for [place] when we physically cannot tolerate chemo. He tells me that he is not ready to die and is doing everything for his family.   We also discussed code status and consideration of his wishes and aggressiveness of care. We discussed the importance of discussing this with his family in case they are ever in the position to make this decision for him. He understands the importance of thinking about this and discussing with his family. However, he did not wish to discuss further.   Spent some time offering support and building rapport. Emotional support provided. Mr. Dombek did say he felt talking today was helpful and open to further discussion.   Recommendations/Plan:  Per primary and PCCM.  Denies SOB, pain, or any discomfort.   Goals of Care and Additional Recommendations:  Limitations on Scope of Treatment: Full Scope Treatment  Code Status:  Full code  Prognosis:   Poor with stage IV NSCLC complicated by pneumococcal pneumonia, empyema, and failure to thrive   Discharge Planning:  To Be Determined  Discussed with Dr. Halford Chessman as well as Dr. Rowe Pavy who will f/u this weekend.   Thank you for allowing the Palliative Medicine Team to assist in the care of this patient.   Total Time 50 min Prolonged Time Billed  no       Greater than 50%  of this time was spent counseling and coordinating care related to the above assessment and plan.  Vinie Sill, NP Palliative  Medicine Team Pager # [phone] (M-F  8a-5p) Team Phone # [phone] (Nights/Weekends)

## 2017-05-01 NOTE — Telephone Encounter (Signed)
Spoke with Marcene Brawn RN, asked if patient could come for radiation it is scheduled for 11:15am today now, per RN, they are waiting on family to get here to discuss with palliative care team, she will call me back at [phone] waiting on call back, informed Trudee Kuster RT Therapist on linac #3 10:35 AM

## 2017-05-02 LAB — CBC WITH DIFFERENTIAL/PLATELET
BASOS ABS: 0 10*3/uL (ref 0.0–0.1)
Basophils Relative: 0 %
EOS PCT: 0 %
Eosinophils Absolute: 0 10*3/uL (ref 0.0–0.7)
HEMATOCRIT: 36.9 % — AB (ref 39.0–52.0)
Hemoglobin: 11.7 g/dL — ABNORMAL LOW (ref 13.0–17.0)
LYMPHS PCT: 7 %
Lymphs Abs: 0.8 10*3/uL (ref 0.7–4.0)
MCH: 29.8 pg (ref 26.0–34.0)
MCHC: 31.7 g/dL (ref 30.0–36.0)
MCV: 94.1 fL (ref 78.0–100.0)
Monocytes Absolute: 0.4 10*3/uL (ref 0.1–1.0)
Monocytes Relative: 3 %
Neutro Abs: 11.2 10*3/uL (ref 1.7–7.7)
Neutrophils Relative %: 90 %
PLATELETS: 262 10*3/uL (ref 150–400)
RBC: 3.92 MIL/uL — AB (ref 4.22–5.81)
RDW: 14.2 % (ref 11.5–15.5)
WBC: 12.5 10*3/uL — AB (ref 4.0–10.5)

## 2017-05-02 LAB — GLUCOSE, CAPILLARY
Glucose-Capillary: 134 mg/dL — ABNORMAL HIGH (ref 65–99)
Glucose-Capillary: 118 mg/dL — ABNORMAL HIGH (ref 65–99)
Glucose-Capillary: 122 mg/dL — ABNORMAL HIGH (ref 65–99)
Glucose-Capillary: 111 mg/dL — ABNORMAL HIGH (ref 65–99)

## 2017-05-02 LAB — BODY FLUID CULTURE

## 2017-05-02 LAB — CREATININE, SERUM
Creatinine, Ser: 0.41 mg/dL — ABNORMAL LOW (ref 0.61–1.24)
GFR calc non Af Amer: >60 mL/min (ref >60)

## 2017-05-02 NOTE — Plan of Care (Signed)
  Coping: Level of anxiety will decrease 05/02/2017 2023 - Progressing by Mickie Kay, RN

## 2017-05-02 NOTE — Progress Notes (Signed)
[facility] for Infectious Disease    Date of Admission:  04/24/2017   Total days of antibiotics 9               Day 4 ceftriaxone          ID: Maurice Tyler is a 76 y.o. male with newly diagnosed stage IV NSCLC on palliative chemo complicated bypneumococcal pneumonia and empyema leading to acute hypoxic/hypercapnic respiratory failure s/p thoracentesis   Principal Problem:   Multifocal pneumonia Active Problems:   Stage IV squamous cell carcinoma of left lung (HCC)   Diabetes mellitus type 2 in nonobese (HCC)   Acute respiratory failure with hypoxia and hypercapnia (HCC)   Severe sepsis (HCC)   Malnutrition of moderate degree   Pressure injury of skin   Empyema of right pleural space (HCC)   New onset a-fib (HCC)   AKI (acute kidney injury) ([place])   Non-small cell cancer of left lung (HCC)   Goals of care, counseling/discussion   Palliative care encounter    Subjective: Afebrile, more alert this morning. Reports mild arm pain. Hungry for lunch  cxr yesterday showed stable hydropneumothorax, and pneumonia  Medications:  . amiodarone  400 mg Oral BID  . budesonide (PULMICORT) nebulizer solution  0.25 mg Nebulization BID  . chlorhexidine  15 mL Mouth Rinse BID  . enoxaparin (LOVENOX) injection  40 mg Subcutaneous QHS  . feeding supplement  1 Container Oral BID BM  . insulin aspart  0-9 Units Subcutaneous TID WC  . ipratropium  0.5 mg Nebulization Q6H  . levalbuterol  0.63 mg Nebulization Q6H  . mouth rinse  15 mL Mouth Rinse q12n4p  . metoprolol tartrate  25 mg Oral BID    Objective: Vital signs in last 24 hours: Temp:  [97.5 F (36.4 C)-99.7 F (37.6 C)] 99.3 F (37.4 C) (12/29 0800) Pulse Rate:  [80-99] 99 (12/29 1005) Resp:  [23-80] 57 (12/29 0800) BP: (74-131)/(33-68) 131/68 (12/29 1005) SpO2:  [91 %-99 %] 97 % (12/29 0839) Weight:  [170 lb 6.7 oz (77.3 kg)] 170 lb 6.7 oz (77.3 kg) (12/29 0500) Physical Exam  Constitutional: He is oriented to  person. He appears well-developed and well-nourished. No distress.  HENT:  Mouth/Throat: Oropharynx is clear and moist. No oropharyngeal exudate.  Cardiovascular: Normal rate, regular rhythm and normal heart sounds. Exam reveals no gallop and no friction rub.  No murmur heard.  Pulmonary/Chest: Effort normal and breath sounds normal. No respiratory distress. Decrease breath sounds at bases Abdominal: Soft. Bowel sounds are normal. He exhibits no distension. There is no tenderness.  Skin: Skin is warm and dry. No rash noted. No erythema.  Ext: r-bka, trace edema on left leg. Anasarca to arms Psychiatric: He has a normal mood and affect. His behavior is normal.   Lab Results Recent Labs    04/30/17 0320 05/01/17 0317 05/02/17 0338  WBC 28.6* 16.9* 12.5*  HGB 13.3 12.1* 11.7*  HCT 40.8 38.9* 36.9*  NA 141 142  --   K 5.0 4.1  --   CL 113* 111  --   CO2 21* 27  --   BUN 63* 32*  --   CREATININE 1.79* 0.66 0.41*    Microbiology: 12/27 pleural fluid = ngtd 12/23 pleural fluid = viridans strep Studies/Results: 12/28 cxr shows stable left hydropneumothorax, some effusion, and pneumonia  Assessment/Plan:  77yo M with newly diagnosed stage IV NSCLC on palliative chemo complicated bypneumococcal pneumonia and empyema leading to acute hypoxic/hypercapnic respiratory failure --  Continue with ceftriaxone 2gm Iv daily to treat streptococcal multifocal pneumonia and empyema, recommend minimum of 2 wk of treatment but may need drain placement but will determine based upon repeat cxr to see if he reaccumulates and goals of care  Leukocytosis = trended down since large volume thoracentesis, from source control  aki = creatinine now improved.  Stage IV NSCLC = appreciate palliative care consultation for goals of care  Prognosis = poor due to severe deconditioning.  Dr Tommy Medal available for questions over the next few days. Will see patient back on [facility] for Infectious Diseases Cell: [phone] Pager: [phone]  05/02/2017, 10:54 AM

## 2017-05-02 NOTE — Progress Notes (Signed)
Daily Progress Note   Patient Name: Maurice Tyler       Date: 05/02/2017 DOB: [birth year]  Age: 76 y.o. MRN#: [id number] Attending Physician: Robbie Lis, MD Primary Care Physician: [name], MD Admit Date: 04/24/2017  Reason for Consultation/Follow-up: Establishing goals of care  Subjective: Mr. Koopmann is resting today. No family at bedside.  See below.  Length of Stay: 7  Current Medications: Scheduled Meds:  . amiodarone  400 mg Oral BID  . budesonide (PULMICORT) nebulizer solution  0.25 mg Nebulization BID  . chlorhexidine  15 mL Mouth Rinse BID  . enoxaparin (LOVENOX) injection  40 mg Subcutaneous QHS  . feeding supplement  1 Container Oral BID BM  . insulin aspart  0-9 Units Subcutaneous TID WC  . ipratropium  0.5 mg Nebulization Q6H  . levalbuterol  0.63 mg Nebulization Q6H  . mouth rinse  15 mL Mouth Rinse q12n4p  . metoprolol tartrate  25 mg Oral BID    Continuous Infusions: . sodium chloride 100 mL/hr at 05/02/17 0614  . amiodarone Stopped (04/30/17 1012)  . cefTRIAXone (ROCEPHIN)  IV Stopped (05/01/17 1744)    PRN Meds: acetaminophen **OR** acetaminophen, levalbuterol, loperamide, LORazepam, metoprolol tartrate, ondansetron **OR** ondansetron (ZOFRAN) IV, prochlorperazine  Physical Exam         Constitutional: Vital signs are normal. He appears chronically ill.  Thin, frail  Cardiovascular: Normal rate and regular rhythm.  Pulmonary/Chest: No accessory muscle usage. No tachypnea. No respiratory distress. He has decreased breath sounds in the left upper field, the left middle field and the left lower field.  Abdominal: Soft. Normal appearance.  Neurological: Alert, oriented x 4  Nursing note and vitals reviewed.   Vital Signs: BP 131/68    Pulse 99   Temp 97.8 F (36.6 C) (Axillary)   Resp (!) 57   Ht 5\' 11"  (1.803 m)   Wt 77.3 kg (170 lb 6.7 oz)   SpO2 97%   BMI 23.77 kg/m  SpO2: SpO2: 97 % O2 Device: O2 Device: High Flow Nasal Cannula O2 Flow Rate: O2 Flow Rate (L/min): 8 L/min(will decrease to 7 lpm post neb)  Intake/output summary:   Intake/Output Summary (Last 24 hours) at 05/02/2017 1342 Last data filed at 05/02/2017 0954 Gross per 24 hour  Intake 2740 ml  Output 1150 ml  Net 1590 ml   LBM: Last BM Date: 04/30/17 Baseline Weight: Weight: 69.9 kg (154 lb 1.6 oz) Most recent weight: Weight: 77.3 kg (170 lb 6.7 oz)       Palliative Assessment/Data: 30%   Flowsheet Rows     Most Recent Value  Intake Tab  Referral Department  Hospitalist  Unit at Time of Referral  Med/Surg Unit  Palliative Care Primary Diagnosis  Sepsis/Infectious Disease  Date Notified  04/29/17  Palliative Care Type  New Palliative care  Reason for referral  Clarify Goals of Care, Counsel Regarding Hospice  Date of Admission  04/24/17  Date first seen by Palliative Care  04/30/17  # of days Palliative referral response time  1 Day(s)  # of days IP prior to Palliative referral  5  Clinical Assessment  Psychosocial & Spiritual Assessment  Palliative Care Outcomes      Patient Active Problem List   Diagnosis Date Noted  . New onset a-fib ([place]) 04/30/2017  . AKI (acute kidney injury) ([place]) 04/30/2017  . Non-small cell cancer of left lung ([place])   . Goals of care, counseling/discussion   . Palliative care encounter   . Empyema of right pleural space ([place]) 04/29/2017  . Malnutrition of moderate degree 04/28/2017  . Pressure injury of skin 04/28/2017  . Diabetes mellitus type 2 in nonobese ([place]) 04/25/2017  . Acute respiratory failure with hypoxia and hypercapnia ([place]) 04/25/2017  . Severe sepsis ([place]) 04/25/2017  . Multifocal pneumonia 04/25/2017  . Malignant neoplasm of bronchus of left upper lobe ([place]) 03/30/2017  . Stage  IV squamous cell carcinoma of left lung ([place]) 03/30/2017    Palliative Care Assessment & Plan   HPI: 76 y.o. male  with past medical history of diabetes mellitus type II, stage IV NSCLC with obstructive LUL mass as well as mediastinal and left hilar adenopathy diagnosed recently with 1 treatment with carboplatin, paclitaxel and Keytruda 04/15/17 admitted on 04/24/2017 with worsening shortness of breath along with productive cough, diarrhea, and failure to thrive with significant weight loss.   Assessment: Mr Bramhall recalls meeting my colleague Ms Jerline Pain, NP on 05-01-17. His wife is not currently at bedside.He seems to have good understanding of the severity of his illness. He thanks me for checking in on him, but declines to discuss further.     Recommendations/Plan:  Per primary and PCCM.  Denies SOB, pain, or any discomfort.   Goals of Care and Additional Recommendations:  Limitations on Scope of Treatment: Full Scope Treatment  Code Status:  Full code  Prognosis:   Poor with stage IV NSCLC complicated by pneumococcal pneumonia, empyema, and failure to thrive   Discharge Planning:  To Be Determined  Discussed with patient.   Thank you for allowing the Palliative Medicine Team to assist in the care of this patient.   Total Time 50 min Prolonged Time Billed  no       Greater than 50%  of this time was spent counseling and coordinating care related to the above assessment and plan.  Loistine Chance MD [phone] Palliative Medicine Team  Team Phone # [phone] (Nights/Weekends)

## 2017-05-02 NOTE — Progress Notes (Signed)
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  Patient being currently managed for multiofocal pneumonia and left-sided pleural effusion.   Assessment & Plan:    Acute respiratory failure with hypoxia and hypercarbia/multifocal pneumonia/empyema:Multifocal pneumonia and large pleural effusion on the left side  present on admission.  Likely postobstructive pneumonia.  Possible contribution of COPD exacerbation also. CXR done on 04/30/17 again showed large right-sided pleural effusion.Underwent right  thoracentesis  with removal of 2.3 L of turbid brown fluid.Will follow-up fluid analysis.  Depending upon that,placement of pigtail catheter will be decided. S/P thoracentesis twice. Blood cx from 12/22 showed no growth Peter fluid culture sent on 04/26/17 showed moderate gram-positive cocci in chains.  Likely streptococcal pneumonia and empyema. Per ID continue ceftriaxone for now  Metastatic stage IV lung cancer:Recently started on palliative chemotherapy.Follows with Dr. [name].  The patient has a stage IV non-small cell lung cancer.  Patient has left upper lobe lung mass.  Also has mediastinal and left hilar adenopathy.  Pleural fluid cytology sent on this admission did not show any malignant cells Was on palliative systemic chemotherapy with carboplatin, paclitaxel and Keytruda. Palliative care following for goals of care   Left-sided pleural effusion: S/P [place] thoracocentesis x 2. Previous pleural food culture revealed rare viridans Streptococcus. Monitor  Sepsis: Presented with elevated lactate and leukocytosis.  Continue Rocpehin  Afib with RVR:    Started on amiodarone.  Cardiology was following   .Echocardiogram showed normal left ventricular function, normal ejection fraction, mild concentric hypertrophy Stable   AKI: Resolved   Diabetes type 2: Continue SSI  Moderate protein energy malnutrition: In the setting of chronic illness  Pressure injury: Continue supportive care.  Wound care consulted   DVT prophylaxis:Lovenox subQ Code Status: Full Family Communication: spoke with pt daughter over the phone  Disposition Plan: remains in SDU   Consultants: CCM, ID, Cardio, PCT  Procedures: ECHO - EF 60%  Antimicrobials:  Ceftriaxone since 04/29/17  Subjective: No overnight events.   Objective: Vitals:   05/02/17 0746 05/02/17 0800 05/02/17 0839 05/02/17 1005  BP:  (!) 117/47  131/68  Pulse:  93  99  Resp:  (!) 57    Temp: (!) 97.5 F (36.4 C) 99.3 F (37.4 C)    TempSrc: Axillary     SpO2:  96% 97%   Weight:      Height:        Intake/Output Summary (Last 24 hours) at 05/02/2017 1117 Last data filed at 05/02/2017 0954 Gross per 24 hour  Intake 2740 ml  Output 1150 ml  Net 1590 ml   Filed Weights   04/30/17 0208 05/01/17 0304 05/02/17 0500  Weight: 76.3 kg (168 lb 3.4 oz) 75.5 kg (166 lb 7.2 oz) 77.3 kg (170 lb 6.7 oz)    Physical Exam  Constitutional: Appears ill, no distress  CVS: RRR, S1/S2 + Pulmonary: rhonchorous, wheezing in upper lung lobes Abdominal: Soft. BS +,  no distension, tenderness, rebound or guarding.  Musculoskeletal: Normal range of motion. No edema and no tenderness.  Neuro: Alert. No cranial nerve deficit. Skin: Skin is warm and dry. Psychiatric: Normal mood and affect.   Data Reviewed: I have personally reviewed following  labs and imaging studies  CBC: Recent Labs  Lab 04/28/17 0317 04/29/17 0319 04/30/17 0320 05/01/17 0317 05/02/17 0338  WBC 23.6* 28.3* 28.6* 16.9* 12.5*  NEUTROABS 22.7* 26.5* 26.8* 15.4* 11.2  HGB 12.0* 12.7* 13.3 12.1* 11.7*  HCT 37.7* 39.7 40.8  38.9* 36.9*  MCV 93.8 94.3 94.2 95.1 94.1  PLT 251 292 311 255 403   Basic Metabolic Panel: Recent Labs  Lab 04/26/17 0311 04/27/17 0327 04/29/17 0319 04/30/17 0320 05/01/17 0317 05/02/17 0338  NA 140 140 141 141 142  --   K 4.5 4.3 4.8 5.0 4.1  --   CL 106 108 112* 113* 111  --   CO2 26 27 22  21* 27  --   GLUCOSE 242* 235* 216* 176* 88  --   BUN 45* 36* 51* 63* 32*  --   CREATININE 0.87 0.57* 1.00 1.79* 0.66 0.41*  CALCIUM 8.6* 8.4* 8.4* 8.3* 8.0*  --    GFR: Estimated Creatinine Clearance: 82.4 mL/min (A) (by C-G formula based on SCr of 0.41 mg/dL (L)). Liver Function Tests: Recent Labs  Lab 04/26/17 0311  AST 25  ALT 22  ALKPHOS 79  BILITOT 0.7  PROT 5.7*  ALBUMIN 2.5*   No results for input(s): LIPASE, AMYLASE in the last 168 hours. No results for input(s): AMMONIA in the last 168 hours. Coagulation Profile: No results for input(s): INR, PROTIME in the last 168 hours. Cardiac Enzymes: Recent Labs  Lab 04/25/17 2009  TROPONINI <0.03   BNP (last 3 results) No results for input(s): PROBNP in the last 8760 hours. HbA1C: No results for input(s): HGBA1C in the last 72 hours. CBG: Recent Labs  Lab 05/01/17 0738 05/01/17 0817 05/01/17 1628 05/01/17 2153 05/02/17 0726  GLUCAP 65 74 113* 122* 134*   Lipid Profile: No results for input(s): CHOL, HDL, LDLCALC, TRIG, CHOLHDL, LDLDIRECT in the last 72 hours. Thyroid Function Tests: No results for input(s): TSH, T4TOTAL, FREET4, T3FREE, THYROIDAB in the last 72 hours. Anemia Panel: No results for input(s): VITAMINB12, FOLATE, FERRITIN, TIBC, IRON, RETICCTPCT in the last 72 hours. Sepsis Labs: Recent Labs  Lab 04/25/17 1643 04/25/17 2009 04/25/17 2258 04/26/17 1242  LATICACIDVEN 2.5* 2.3* 2.6* 2.0*    Recent Results (from the past 240 hour(s))  Culture, blood (routine x 2)     Status: None   Collection Time: 04/24/17 11:41 PM  Result Value Ref Range Status   Specimen Description BLOOD LEFT  ANTECUBITAL  Final   Special Requests   Final    BOTTLES DRAWN AEROBIC AND ANAEROBIC Blood Culture results may not be optimal due to an inadequate volume of blood received in culture bottles   Culture   Final    NO GROWTH 5 DAYS Performed at [facility] [address], [place], [place] [address]    Report Status 04/30/2017 FINAL  Final  MRSA PCR Screening     Status: None   Collection Time: 04/25/17  3:41 PM  Result Value Ref Range Status   MRSA by PCR NEGATIVE NEGATIVE Final    Comment:        The GeneXpert MRSA Assay (FDA approved for NASAL specimens only), is one component of a comprehensive MRSA colonization surveillance program. It is not intended to diagnose MRSA infection nor to guide or monitor treatment for MRSA infections.   Culture, blood (routine x 2)     Status: None   Collection Time: 04/25/17  4:43 PM  Result Value Ref Range Status   Specimen  Description BLOOD RIGHT ANTECUBITAL  Final   Special Requests IN PEDIATRIC BOTTLE Blood Culture adequate volume  Final   Culture   Final    NO GROWTH 5 DAYS Performed at [facility], [place] [address], [place], [place] [address]    Report Status 05/01/2017 FINAL  Final  Body fluid culture     Status: None (Preliminary result)   Collection Time: 04/26/17 12:24 PM  Result Value Ref Range Status   Specimen Description PLEURAL LEFT  Final   Special Requests NONE  Final   Gram Stain   Final    ABUNDANT WBC PRESENT, PREDOMINANTLY PMN MODERATE GRAM POSITIVE COCCI IN PAIRS IN CHAINS    Culture   Final    RARE VIRIDANS STREPTOCOCCUS SUSCEPTIBILITIES TO FOLLOW Performed at [facility], [place] [address] [employer], [place] [address]    Report Status PENDING  Incomplete  Fungus Culture With Stain     Status: None (Preliminary result)   Collection Time: 04/26/17 12:24 PM  Result Value Ref Range Status   Fungus Stain Final report  Final    Comment: (NOTE) Performed At: [facility] [place], [place] [id number] Rush Farmer MD [id number]    Fungus (Mycology) Culture PENDING  Incomplete   Fungal Source PLEURAL  Final    Comment: LEFT  Fungus Culture Result     Status: None   Collection Time: 04/26/17 12:24 PM  Result Value Ref Range Status   Result 1 Comment  Final    Comment: (NOTE) KOH/Calcofluor preparation:  no fungus observed. Performed At: [facility] [address] [place], [place] [id number] Rush Farmer MD [id number]   Culture, body fluid-bottle     Status: None (Preliminary result)   Collection Time: 04/30/17 11:14 AM  Result Value Ref Range Status   Specimen Description FLUID PLEURAL LEFT  Final   Special Requests NONE  Final   Culture   Final    NO GROWTH 1 DAY Performed at [facility], [place] [address], [place], [place] [address]    Report Status PENDING  Incomplete  Gram stain     Status: None   Collection Time: 04/30/17 11:14 AM  Result Value Ref Range Status   Specimen Description FLUID PLEURAL LEFT  Final   Special Requests NONE  Final   Gram Stain   Final    ABUNDANT WBC PRESENT, PREDOMINANTLY PMN NO ORGANISMS SEEN Performed at [facility], [place] [address] [place], [place] [address]    Report Status 04/30/2017 FINAL  Final         Radiology Studies: Ct Chest W Contrast  Result Date: 04/30/2017 CLINICAL DATA:  Inpatient. 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 Patient admitted with multifocal pneumonia, large left pleural effusion, sepsis and respiratory failure. Status post left thoracentesis earlier today. EXAM: CT CHEST WITH CONTRAST TECHNIQUE: Multidetector CT imaging of the chest was performed during intravenous contrast administration. CONTRAST:  76mL ISOVUE-300 IOPAMIDOL (ISOVUE-300) INJECTION 61% COMPARISON:  Chest radiograph from earlier today. 04/24/2017 chest CT angiogram. FINDINGS: Cardiovascular:  Normal heart size. Trace pericardial effusion/thickening, stable. Atherosclerotic nonaneurysmal thoracic aorta. Normal caliber main pulmonary artery. No central pulmonary emboli. Significant extrinsic narrowing of the central left pulmonary artery is by tumor is not appreciably changed since 04/24/2017. Mediastinum/Nodes: No discrete thyroid nodules. No acute esophageal abnormality. No axillary adenopathy. Asymmetric mildly prominent 0.7 cm left internal mammary node (series 2/  image 58), previously 0.5 cm on 04/24/2017, minimally increased. Mildly prominent 0.9 cm left pericardiophrenic node (series 2/ image 123), previously 0.5 cm on 04/24/2017, mildly increased. No right hilar adenopathy. Lungs/Pleura: There is a poorly marginated infiltrative partially cavitary central left lung mass measuring approximately 10.9 x 6.6 cm (series 2/ image 74), which is contiguous with the left hilar and left subcarinal nodal territories, which appears mildly increased from 10.6 x 5.7 cm on 04/24/2017 using similar measurement technique. Persistent occluded left upper lobe bronchus with complete left upper lobe atelectasis. Patchy consolidation and volume loss throughout the left lower lobe has worsened since 04/24/2017. Patchy consolidation and ground-glass attenuation in the dependent right upper lobe and dependent right middle lobe and patchy ground-glass centrilobular nodularity in the right lower lobe of fall worsened since 04/24/2017 chest CT. Large loculated left hydropneumothorax with multiple fluid levels, overall stable in size since 04/24/2017 chest CT, with new left pneumothorax component and decreased left pleural fluid component. Trace dependent right pleural effusion with mild dependent right lower lobe atelectasis, new. Underlying moderate centrilobular and paraseptal emphysema with diffuse bronchial wall thickening. Stable calcified subcentimeter dependent basilar right lower lobe granuloma. Upper abdomen:  Cholelithiasis. Musculoskeletal:  No aggressive appearing focal osseous lesions. IMPRESSION: 1. Worsening bilateral multilobar pneumonia. 2. Large left hydropneumothorax, overall stable in size since 04/24/2017 chest CT, with new left pneumothorax component and with decreased left pleural fluid component status post interval thoracentesis. 3. Infiltrative poorly marginated partially cavitary central left lung malignancy with left hilar and subcarinal mediastinal invasion, apparently mildly increased in size in the interval. Stable occluded left upper lobe bronchus with complete left upper lobe atelectasis. 4. Nonspecific mild left internal mammary and left pericardiophrenic mediastinal adenopathy is mildly increased. 5. New trace dependent right pleural effusion. 6. Cholelithiasis. Aortic Atherosclerosis (ICD10-I70.0) and Emphysema (ICD10-J43.9). Electronically Signed   By: Ilona Sorrel M.D.   On: 04/30/2017 12:37   Dg Chest Port 1 View  Result Date: 05/01/2017 CLINICAL DATA:  Hydropneumothorax. EXAM: PORTABLE CHEST 1 VIEW COMPARISON:  One-view chest x-ray 04/30/2017. CT of the chest 04/30/2017. FINDINGS: A complex left-sided hydropneumothorax is not significantly changed. Complex left-sided airspace disease is again noted. With the patient more upright, the air component is better seen at the apex. The heart size is normal. Interstitial coarsening in the right lung is stable. IMPRESSION: 1. Stable complex left-sided hydropneumothorax and pneumonia. Electronically Signed   By: San Morelle M.D.   On: 05/01/2017 07:09   Dg Chest Port 1 View  Result Date: 04/30/2017 CLINICAL DATA:  Left-sided hydropneumothorax EXAM: PORTABLE CHEST 1 VIEW COMPARISON:  Chest CT April 30, 2016 and chest radiograph April 30, 2016 FINDINGS: Extensive changes of hydropneumothorax on the left remain without apparent change compared to earlier in the day. Cavitary lesion in the left perihilar region is again noted. CT  shows this area to represent a cavitary mass. There is airspace consolidation in the left mid lung areas not directly involved with hydropneumothorax. There are patchy areas of opacity in the right mid lung and right base region, better seen on CT. Heart size is normal. The pulmonary vascularity on the right is within normal limits. Pulmonary vascularity on the left is distorted. There is aortic atherosclerosis. No bone lesions. IMPRESSION: Hydropneumothorax remains on the left without appreciable change. Areas of consolidation primarily in the left mid lung remain. Mass with cavitation in the left perihilar region persists without appreciable change compared to earlier in the day. Patchy infiltrate right mid lung and right  base. Stable cardiac silhouette. There is aortic atherosclerosis. Aortic Atherosclerosis (ICD10-I70.0). Electronically Signed   By: Lowella Grip III M.D.   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 Request is made for repeat thoracentesis a plan for CT scan of the chest to follow the plan for possible need for drain placement. EXAM: ULTRASOUND GUIDED DIAGNOSTIC AND THERAPEUTIC THORACENTESIS COMPARISON:  Chest radiograph - 04/30/2017; 04/26/2017; ultrasound-guided thoracentesis - 04/26/2017 MEDICATIONS: 1% lidocaine COMPLICATIONS: SIR Level A - No therapy, no consequence. Procedure complicated by development of an asymptomatic ex vacuo pneumothorax as demonstrated on prior ultrasound-guided thoracentesis performed 04/26/2017. PROCEDURE: An ultrasound guided thoracentesis was thoroughly discussed with the patient and questions answered. The benefits, risks, alternatives and complications were also discussed. The patient understands and wishes to proceed with the procedure. Written consent was  obtained. Ultrasound was performed to localize and mark an adequate pocket of fluid in the left chest. The area was then prepped and draped in the normal sterile fashion. 1% Lidocaine was used for local anesthesia. Under ultrasound guidance a Safe-T-Centesis catheter was introduced. Thoracentesis was performed. The catheter was removed and a dressing applied. FINDINGS: A total of approximately 2.3 L of slightly cloudy brown, yellow fluid was removed. Samples were sent to the laboratory as requested by the clinical team. IMPRESSION: Successful ultrasound guided left thoracentesis yielding 2.3 L of pleural fluid. The patient's follow-up CT scan shows a persistent hydropneumothorax that is stable from imaging several days ago to maybe slightly increased. The patient remains asymptomatic. No intervention is required at this time as it appears his lung is trapped. We will await cultures to determine if this fluid is still infected. If it is, he will likely require a drain placement for continued drainage. If his cultures are negative then he will likely require no further intervention. Read by: Saverio Danker, PA-C Electronically Signed   By: Sandi Mariscal M.D.   On: 04/30/2017 14:00        Scheduled Meds: . amiodarone  400 mg Oral BID  . budesonide (PULMICORT) nebulizer solution  0.25 mg Nebulization BID  . chlorhexidine  15 mL Mouth Rinse BID  . enoxaparin (LOVENOX) injection  40 mg Subcutaneous QHS  . feeding supplement  1 Container Oral BID BM  . insulin aspart  0-9 Units Subcutaneous TID WC  . ipratropium  0.5 mg Nebulization Q6H  . levalbuterol  0.63 mg Nebulization Q6H  . mouth rinse  15 mL Mouth Rinse q12n4p  . metoprolol tartrate  25 mg Oral BID   Continuous Infusions: . sodium chloride 100 mL/hr at 05/02/17 0614  . amiodarone Stopped (04/30/17 1012)  . cefTRIAXone (ROCEPHIN)  IV Stopped (05/01/17 1744)     LOS: 7 days    Time spent: 25 mins    Leisa Lenz, MD [facility] [phone]  If 7PM-7AM, please contact night-coverage [email or web address] Password [email or web address] 05/02/2017, 11:17 AM

## 2017-05-03 LAB — BASIC METABOLIC PANEL
Anion gap: 7 (ref 5–15)
BUN: 17 mg/dL (ref 6–20)
CALCIUM: 7.8 mg/dL — AB (ref 8.9–10.3)
CO2: 26 mmol/L (ref 22–32)
CREATININE: 0.51 mg/dL — AB (ref 0.61–1.24)
Chloride: 103 mmol/L (ref 101–111)
GFR calc non Af Amer: >60 mL/min (ref >60)
GLUCOSE: 116 mg/dL — AB (ref 65–99)
Potassium: 4 mmol/L (ref 3.5–5.1)
Sodium: 136 mmol/L (ref 135–145)

## 2017-05-03 LAB — GLUCOSE, CAPILLARY
Glucose-Capillary: 103 mg/dL — ABNORMAL HIGH (ref 65–99)
Glucose-Capillary: 140 mg/dL — ABNORMAL HIGH (ref 65–99)
Glucose-Capillary: 166 mg/dL — ABNORMAL HIGH (ref 65–99)
Glucose-Capillary: 182 mg/dL — ABNORMAL HIGH (ref 65–99)

## 2017-05-03 LAB — CBC WITH DIFFERENTIAL/PLATELET
BASOS ABS: 0 10*3/uL (ref 0.0–0.1)
Basophils Relative: 0 %
Eosinophils Absolute: 0 10*3/uL (ref 0.0–0.7)
Eosinophils Relative: 0 %
HEMATOCRIT: 34.6 % — AB (ref 39.0–52.0)
Hemoglobin: 11 g/dL — ABNORMAL LOW (ref 13.0–17.0)
LYMPHS ABS: 1.2 10*3/uL (ref 0.7–4.0)
Lymphocytes Relative: 10 %
MCH: 29.9 pg (ref 26.0–34.0)
MCHC: 31.8 g/dL (ref 30.0–36.0)
MCV: 94 fL (ref 78.0–100.0)
MONOS PCT: 2 %
Monocytes Absolute: 0.2 10*3/uL (ref 0.1–1.0)
NEUTROS ABS: 10.4 10*3/uL — AB (ref 1.7–7.7)
Neutrophils Relative %: 88 %
Platelets: 255 10*3/uL (ref 150–400)
RBC: 3.68 MIL/uL — ABNORMAL LOW (ref 4.22–5.81)
RDW: 13.8 % (ref 11.5–15.5)
WBC: 11.8 10*3/uL — ABNORMAL HIGH (ref 4.0–10.5)

## 2017-05-03 NOTE — Progress Notes (Signed)
Patient ID: Maurice Tyler, male   DOB: 1940/10/01, 76 y.o.   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  Patient being currently managed for multiofocal pneumonia and left-sided pleural effusion.  Assessment & Plan:   Acute respiratory failure with hypoxia and hypercarbia / Sepsis secondary to streptococcal multifocal pneumonia / empyema / malignant left side pleural effusion - CXR on admission showed left perihilar soft tissue mass consistent with known malignancy. Moderate left pleural effusion improved compared with 04/08/2017 - Pt is status post thoracentesis 12/23 - 1.4 L fluid removed; thoracentesis 12/27 - 2.3 L fluid removed; likely malignant pleural effusion in the setting of metastatic lung cancer - Blood cx without growth  - Per ID, continue rocephin for streptococcal pneumonia   Metastatic stage IV lung cancer - Follows with Dr. [name]  - Palliative following for goals of care  - Continue current bronchodilators   Afib with RVR - CHA2DS2-VASc Score 2  - On amiodarone and metoprolol   AKI - Due to sepsis - Resolved   Diabetes type 2 without complications  - Continue SSI  Moderate protein calorie malnutrition - In the setting of chronic illness - Seen by dietician   Skin pressure injury - Wound care consulted    DVT prophylaxis: Lovenox subQ Code Status: full code  Family Communication: no family at the bedside Disposition Plan: remains in SDU   Consultants:   PCT  ID, Dr. Baxter Flattery   CCM. Dr. Halford Chessman  Nutrition  Procedures:   Thoracentesis 12/23 and 12/27  Antimicrobials:   Rocephin       Subjective: No overnight events.  Objective: Vitals:   05/03/17 0600 05/03/17 0751 05/03/17 0800 05/03/17 0808  BP:      Pulse:      Resp:      Temp:   98.3 F (36.8 C)   TempSrc:   Core   SpO2:  93%  90%  Weight: 78.6 kg (173 lb 4.5 oz)     Height:        Intake/Output Summary (Last 24 hours) at 05/03/2017 0836 Last data filed at 05/03/2017 0500 Gross per 24 hour  Intake 1819.17 ml  Output 800 ml  Net 1019.17 ml   Filed Weights   05/01/17 0304 05/02/17 0500 05/03/17 0600  Weight: 75.5 kg (166 lb 7.2 oz) 77.3 kg (170 lb 6.7 oz) 78.6 kg (173 lb 4.5 oz)    Examination:  General exam: Appears calm and comfortable  Respiratory system: rhonchi on left mid lobe more so than right  Cardiovascular system: S1 & S2 heard, Rate controlled  Gastrointestinal system: Abdomen is nondistended, soft and nontender. No organomegaly or masses felt. Normal bowel sounds heard. Central nervous system: Alert and oriented. No focal neurological deficits. Extremities: Pedal +1 edema, palpable pulses Skin: No rashes, lesions or ulcers Psychiatry: Judgement and insight appear normal. Mood & affect appropriate.   Data Reviewed: I have personally reviewed following labs and imaging studies  CBC: Recent Labs  Lab 04/29/17 0319 04/30/17 0320 05/01/17 0317 05/02/17 0338 05/03/17 0241  WBC 28.3* 28.6* 16.9* 12.5* 11.8*  NEUTROABS 26.5* 26.8* 15.4* 11.2 10.4*  HGB 12.7* 13.3 12.1* 11.7* 11.0*  HCT 39.7 40.8 38.9* 36.9* 34.6*  MCV 94.3 94.2 95.1 94.1 94.0  PLT 292 311 255 262 301   Basic Metabolic Panel: Recent Labs  Lab 04/27/17 0327 04/29/17 0319 04/30/17 0320 05/01/17 0317 05/02/17 0338 05/03/17 0241  NA 140 141 141 142  --  136  K 4.3 4.8 5.0 4.1  --  4.0  CL 108 112* 113* 111  --  103  CO2 27 22 21* 27  --  26  GLUCOSE 235* 216* 176* 88  --  116*  BUN 36* 51* 63* 32*  --  17  CREATININE 0.57* 1.00 1.79* 0.66 0.41* 0.51*  CALCIUM 8.4* 8.4* 8.3* 8.0*  --  7.8*    GFR: Estimated Creatinine Clearance: 82.4 mL/min (A) (by C-G formula based on SCr of 0.51 mg/dL (L)). Liver Function Tests: No results for input(s): AST, ALT, ALKPHOS, BILITOT, PROT, ALBUMIN in the last 168 hours. No results for input(s): LIPASE, AMYLASE in the last 168 hours. No results for input(s): AMMONIA in the last 168 hours. Coagulation Profile: No results for input(s): INR, PROTIME in the last 168 hours. Cardiac Enzymes: No results for input(s): CKTOTAL, CKMB, CKMBINDEX, TROPONINI in the last 168 hours. BNP (last 3 results) No results for input(s): PROBNP in the last 8760 hours. HbA1C: No results for input(s): HGBA1C in the last 72 hours. CBG: Recent Labs  Lab 05/01/17 2153 05/02/17 0726 05/02/17 1204 05/02/17 1657 05/02/17 2135  GLUCAP 122* 134* 118* 122* 111*   Lipid Profile: No results for input(s): CHOL, HDL, LDLCALC, TRIG, CHOLHDL, LDLDIRECT in the last 72 hours. Thyroid Function Tests: No results for input(s): TSH, T4TOTAL, FREET4, T3FREE, THYROIDAB in the last 72 hours. Anemia Panel: No results for input(s): VITAMINB12, FOLATE, FERRITIN, TIBC, IRON, RETICCTPCT in the last 72 hours. Urine analysis:    Component Value Date/Time   COLORURINE AMBER (A) 04/26/2017 0311   APPEARANCEUR HAZY (A) 04/26/2017 0311   LABSPEC >1.046 (H) 04/26/2017 0311   PHURINE 5.0 04/26/2017 0311   GLUCOSEU 50 (A) 04/26/2017 0311   HGBUR NEGATIVE 04/26/2017 0311   BILIRUBINUR NEGATIVE 04/26/2017 0311   KETONESUR 5 (A) 04/26/2017 0311   PROTEINUR 30 (A) 04/26/2017 0311   NITRITE NEGATIVE 04/26/2017 0311   LEUKOCYTESUR NEGATIVE 04/26/2017 0311   Sepsis Labs: @LABRCNTIP (procalcitonin:4,lacticidven:4)   ) Recent Results (from the past 240 hour(s))  Culture, blood (routine x 2)     Status: None   Collection Time: 04/24/17 11:41 PM  Result Value Ref Range Status   Specimen Description BLOOD LEFT ANTECUBITAL  Final   Special Requests   Final    BOTTLES DRAWN AEROBIC AND  ANAEROBIC Blood Culture results may not be optimal due to an inadequate volume of blood received in culture bottles   Culture   Final    NO GROWTH 5 DAYS Performed at [facility], [place] [address] [place], [place] [address]    Report Status 04/30/2017 FINAL  Final  MRSA PCR Screening     Status: None   Collection Time: 04/25/17  3:41 PM  Result Value Ref Range Status   MRSA by PCR NEGATIVE NEGATIVE Final    Comment:        The GeneXpert MRSA Assay (FDA approved for NASAL specimens only), is one component of a comprehensive MRSA colonization surveillance program. It is not intended to diagnose MRSA infection nor to guide or monitor treatment for MRSA infections.   Culture, blood (routine x 2)     Status: None   Collection  Time: 04/25/17  4:43 PM  Result Value Ref Range Status   Specimen Description BLOOD RIGHT ANTECUBITAL  Final   Special Requests IN PEDIATRIC BOTTLE Blood Culture adequate volume  Final   Culture   Final    NO GROWTH 5 DAYS Performed at [facility], [place] [address], [place], [place] [address]    Report Status 05/01/2017 FINAL  Final  Body fluid culture     Status: None   Collection Time: 04/26/17 12:24 PM  Result Value Ref Range Status   Specimen Description PLEURAL LEFT  Final   Special Requests NONE  Final   Gram Stain   Final    ABUNDANT WBC PRESENT, PREDOMINANTLY PMN MODERATE GRAM POSITIVE COCCI IN PAIRS IN CHAINS Performed at [facility], [place] [address] [place], [place] [address]    Culture RARE VIRIDANS STREPTOCOCCUS  Final   Report Status 05/02/2017 FINAL  Final   Organism ID, Bacteria VIRIDANS STREPTOCOCCUS  Final      Susceptibility   Viridans streptococcus - MIC*    ERYTHROMYCIN <=0.12 SENSITIVE Sensitive     LEVOFLOXACIN 0.5 SENSITIVE Sensitive     VANCOMYCIN 0.5 SENSITIVE Sensitive     * RARE VIRIDANS STREPTOCOCCUS  Fungus Culture With Stain     Status: None (Preliminary result)   Collection Time: 04/26/17 12:24 PM   Result Value Ref Range Status   Fungus Stain Final report  Final    Comment: (NOTE) Performed At: [facility] [place], [place] [id number] Rush Farmer MD OE:[id number]    Fungus (Mycology) Culture PENDING  Incomplete   Fungal Source PLEURAL  Final    Comment: LEFT  Fungus Culture Result     Status: None   Collection Time: 04/26/17 12:24 PM  Result Value Ref Range Status   Result 1 Comment  Final    Comment: (NOTE) KOH/Calcofluor preparation:  no fungus observed. Performed At: [facility] [address] [place], [place] [id number] Rush Farmer MD CV:[id number]   Culture, body fluid-bottle     Status: None (Preliminary result)   Collection Time: 04/30/17 11:14 AM  Result Value Ref Range Status   Specimen Description FLUID PLEURAL LEFT  Final   Special Requests NONE  Final   Culture   Final    NO GROWTH 2 DAYS Performed at [facility], [address], [place], [place] [address]    Report Status PENDING  Incomplete  Gram stain     Status: None   Collection Time: 04/30/17 11:14 AM  Result Value Ref Range Status   Specimen Description FLUID PLEURAL LEFT  Final   Special Requests NONE  Final   Gram Stain   Final    ABUNDANT WBC PRESENT, PREDOMINANTLY PMN NO ORGANISMS SEEN Performed at [facility], [place] [address], [place], [place] [address]    Report Status 04/30/2017 FINAL  Final      Radiology Studies: Dg Chest 1 View Result Date: 04/29/2017 1. Large left pleural effusion appears to have recurred. Residual left apical pneumothorax may remain. 2. Persistent left lung infiltrate. 3. Stable cardiomegaly . Electronically Signed   By: Marcello Moores  Register   On: 04/29/2017 14:53   Ct Chest W Contrast Result Date: 04/30/2017 1. Worsening bilateral multilobar pneumonia. 2. Large left hydropneumothorax, overall stable in size since 04/24/2017 chest CT, with new left pneumothorax component and with decreased left pleural fluid  component status post interval thoracentesis. 3. Infiltrative poorly marginated partially cavitary central left lung  malignancy with left hilar and subcarinal mediastinal invasion, apparently mildly increased in size in the interval. Stable occluded left upper lobe bronchus with complete left upper lobe atelectasis. 4. Nonspecific mild left internal mammary and left pericardiophrenic mediastinal adenopathy is mildly increased. 5. New trace dependent right pleural effusion. 6. Cholelithiasis. Aortic Atherosclerosis (ICD10-I70.0) and Emphysema (ICD10-J43.9). Electronically Signed   By: Ilona Sorrel M.D.   On: 04/30/2017 12:37   Dg Chest Port 1 View Result Date: 05/01/2017 1. Stable complex left-sided hydropneumothorax and pneumonia. Electronically Signed   By: San Morelle M.D.   On: 05/01/2017 07:09   Dg Chest Port 1 View Result Date: 04/30/2017 Hydropneumothorax remains on the left without appreciable change. Areas of consolidation primarily in the left mid lung remain. Mass with cavitation in the left perihilar region persists without appreciable change compared to earlier in the day. Patchy infiltrate right mid lung and right base. Stable cardiac silhouette. There is aortic atherosclerosis. Aortic Atherosclerosis (ICD10-I70.0). Electronically Signed   By: Lowella Grip III M.D.   On: 04/30/2017 20:25   Dg Chest Port 1 View Result Date: 04/30/2017 Stable left pleural effusion and likely component of hydropneumothorax. No new focal abnormality is noted. Stable left-sided infiltrates. Electronically Signed   By: Inez Catalina M.D.   On: 04/30/2017 07:36   US Thoracentesis Asp Pleural Space W/img Guide Result Date: 04/30/2017 Successful ultrasound guided left thoracentesis yielding 2.3 L of pleural fluid. The patient's follow-up CT scan shows a persistent hydropneumothorax that is stable from imaging several days ago to maybe slightly increased. The patient remains asymptomatic. No  intervention is required at this time as it appears his lung is trapped. We will await cultures to determine if this fluid is still infected. If it is, he will likely require a drain placement for continued drainage. If his cultures are negative then he will likely require no further intervention. Read by: Saverio Danker, PA-C Electronically Signed   By: Sandi Mariscal M.D.   On: 04/30/2017 14:00      Scheduled Meds: . amiodarone  400 mg Oral BID  . budesonide (PULMICORT) nebulizer solution  0.25 mg Nebulization BID  . chlorhexidine  15 mL Mouth Rinse BID  . enoxaparin (LOVENOX) injection  40 mg Subcutaneous QHS  . feeding supplement  1 Container Oral BID BM  . insulin aspart  0-9 Units Subcutaneous TID WC  . ipratropium  0.5 mg Nebulization Q6H  . levalbuterol  0.63 mg Nebulization Q6H  . mouth rinse  15 mL Mouth Rinse q12n4p  . metoprolol tartrate  25 mg Oral BID   Continuous Infusions: . sodium chloride 50 mL/hr at 05/02/17 1757  . amiodarone Stopped (04/30/17 1012)  . cefTRIAXone (ROCEPHIN)  IV Stopped (05/02/17 1313)     LOS: 8 days    Time spent: 25 minutes  Greater than 50% of the time spent on counseling and coordinating the care.   Leisa Lenz, MD [facility] Pager [phone]  If 7PM-7AM, please contact night-coverage [email or web address] Password [facility] 05/03/2017, 8:36 AM

## 2017-05-03 NOTE — Progress Notes (Signed)
Placed Sterile Water in [place] 02 system- RN aware.

## 2017-05-04 ENCOUNTER — Ambulatory Visit
Admission: RE | Admit: 2017-05-04 | Discharge: 2017-05-04 | Disposition: A | Discharge status: Home / Self Care | Payer: Medicare Other | Source: Ambulatory Visit | Attending: Radiation Oncology | Admitting: Radiation Oncology

## 2017-05-04 ENCOUNTER — Inpatient Hospital Stay (HOSPITAL_COMMUNITY): Payer: Medicare Other

## 2017-05-04 ENCOUNTER — Ambulatory Visit: Payer: Medicare Other

## 2017-05-04 DIAGNOSIS — J154 Pneumonia due to other streptococci: Secondary | ICD-10-CM

## 2017-05-04 LAB — CBC WITH DIFFERENTIAL/PLATELET
BASOS ABS: 0 10*3/uL (ref 0.0–0.1)
Basophils Relative: 0 %
Eosinophils Absolute: 0 10*3/uL (ref 0.0–0.7)
Eosinophils Relative: 0 %
HEMATOCRIT: 36.9 % — AB (ref 39.0–52.0)
HEMOGLOBIN: 11.6 g/dL — AB (ref 13.0–17.0)
LYMPHS PCT: 8 %
Lymphs Abs: 1 10*3/uL (ref 0.7–4.0)
MCH: 29.8 pg (ref 26.0–34.0)
MCHC: 31.4 g/dL (ref 30.0–36.0)
MCV: 94.9 fL (ref 78.0–100.0)
MONOS PCT: 4 %
Monocytes Absolute: 0.5 10*3/uL (ref 0.1–1.0)
Neutro Abs: 11.6 10*3/uL — ABNORMAL HIGH (ref 1.7–7.7)
Neutrophils Relative %: 88 %
Platelets: 225 10*3/uL (ref 150–400)
RBC: 3.89 MIL/uL — AB (ref 4.22–5.81)
RDW: 14 % (ref 11.5–15.5)
WBC: 13.1 10*3/uL — AB (ref 4.0–10.5)

## 2017-05-04 LAB — GLUCOSE, CAPILLARY
GLUCOSE-CAPILLARY: 122 mg/dL — AB (ref 65–99)
Glucose-Capillary: 120 mg/dL — ABNORMAL HIGH (ref 65–99)
Glucose-Capillary: 168 mg/dL — ABNORMAL HIGH (ref 65–99)
Glucose-Capillary: 127 mg/dL — ABNORMAL HIGH (ref 65–99)

## 2017-05-04 LAB — BASIC METABOLIC PANEL
ANION GAP: 4 — AB (ref 5–15)
BUN: 16 mg/dL (ref 6–20)
CALCIUM: 7.9 mg/dL — AB (ref 8.9–10.3)
CHLORIDE: 102 mmol/L (ref 101–111)
CO2: 29 mmol/L (ref 22–32)
Creatinine, Ser: 0.43 mg/dL — ABNORMAL LOW (ref 0.61–1.24)
GFR calc non Af Amer: >60 mL/min (ref >60)
Glucose, Bld: 153 mg/dL — ABNORMAL HIGH (ref 65–99)
POTASSIUM: 4.1 mmol/L (ref 3.5–5.1)
Sodium: 135 mmol/L (ref 135–145)

## 2017-05-04 MED ORDER — GERHARDT'S BUTT CREAM
TOPICAL_CREAM | Freq: Three times a day (TID) | CUTANEOUS | Status: DC
  Administered 2017-05-04 – 2017-05-05 (×3): via TOPICAL
  Administered 2017-05-05: 1 via TOPICAL
  Administered 2017-05-05 – 2017-05-06 (×2): via TOPICAL
  Administered 2017-05-06: 1 via TOPICAL
  Administered 2017-05-06 – 2017-05-10 (×10): via TOPICAL
  Administered 2017-05-10: 1 via TOPICAL
  Administered 2017-05-10 – 2017-05-13 (×6): via TOPICAL
  Administered 2017-05-13: 1 via TOPICAL
  Administered 2017-05-14 – 2017-05-18 (×12): via TOPICAL
  Administered 2017-05-18: 1 via TOPICAL
  Administered 2017-05-18 – 2017-05-20 (×5): via TOPICAL
  Filled 2017-05-04 (×3): qty 1

## 2017-05-04 NOTE — Progress Notes (Signed)
STAFF NOTE: I, Dr Ann Lions have personally reviewed patient's available data, including medical history, events of note, physical examination and test results as part of my evaluation. I have discussed with resident/NP and other care providers such as pharmacist, RN and RRT.  In addition,  I personally evaluated patient and elicited key findings of   S: LOS 9 days. Feels same. S.op XRT 05/04/2017 . Feels tired  O: On 10L [place] - new o2 need since admit Looks cachectic ECOG 4 - bed bound Mild tachypnea with early paradoxus  Able to talk sentences Edema + Normal heart sounds  S/p thora 12/23 - 1.4L exudate with Low GLUC 21  With strpe viridans growth And 12/27 - no cell count/chemistry - both cytology show acute inflammatory cells  Urine strep poisitive  Lasxt cxr 05/01/17  A:  Empyema Left due to bacterial pneumonia due to immune suppression due to stage 4 NSCLC and chemo On 05/04/2017 - concerned fluid has reacummulated  P: CXR port If concern for fluid back, consider IR guided drainage of empyema (despite concerns of trapped lung) given gluc < 25 to aid drainage and recoverfy and palliation from empyema  D/w Dr Charlies Silvers of [facility]    Dr. Brand Males, M.D., [facility].C.P Pulmonary and Critical Care Medicine Staff Physician [facility] Pager: [phone], If no answer or between  15:00h - 7:00h: call [phone]  05/04/2017 11:16 AM

## 2017-05-04 NOTE — Consult Note (Signed)
[facility] Nurse wound consult note Reason for Consult:Denuded areas on bilateral lower buttocks and perineal area, blisters on the left thoracic area, erythematous heels (blanching) Wound type:Pressure, moisture Pressure Injury POA: Yes, N/A Measurement:Bilateral heels with erythema (blanching) measuring 4cm x 3cm. Intact. Left thoracic chest with two areas of blistering (moist cell desquamation) measuring 3cm x 2cm (intact blisters) and 2cm x 1cm x 0.1cm (ruptured). Bliateral lower buttocks and perineal area with denuded areas measuring 4cm x 6cm x 0.1cm Wound CXF:QHKU, moist and as described above Drainage (amount, consistency, odor) scant serous Periwound:itnact, dry Dressing procedure/placement/frequency: Patient to remain on a mattress replacement with low air loss feature after transfer to floor, pressure redistribution heel boots to be provided today.  Dr. Lurline Del Butt cream to the denuded areas on buttocks and silicone foam to left thoracic chest. [place] nursing team will not follow, but will remain available to this patient, the nursing and medical teams.  Please re-consult if needed. Thanks, Maudie Flakes, MSN, RN, [place], Arther Abbott  Pager# [phone]

## 2017-05-04 NOTE — Progress Notes (Signed)
Date: May 04, 2017 Velva Harman, BSN, [place], [place] Chart and notes review for patient progress and needs. Will follow for case management and discharge needs. Next review date: [id number]

## 2017-05-04 NOTE — Progress Notes (Signed)
IR aware of request by CCM.  Dr. Pascal Lux attempting to page Dr. Chase Caller to discuss drain placement.  Maurice Tyler 4:09 PM 05/04/2017

## 2017-05-04 NOTE — Progress Notes (Addendum)
Patient ID: Maurice Tyler, male   DOB: [birth year], 76 y.o.   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  Patient being currently managed for multiofocal pneumonia and left-sided pleural effusion.  Assessment & Plan:   Acute respiratory failure with hypoxia and hypercarbia / Sepsis secondary to streptococcal multifocal pneumonia / empyema / malignant left side pleural effusion - CXR on admission showed left perihilar soft tissue mass consistent with known malignancy. Moderate left pleural effusion improved compared with 04/08/2017 - Pt is status post thoracentesis 12/23 - 1.4 L fluid removed; thoracentesis 12/27 - 2.3 L fluid removed; likely malignant pleural effusion in the setting of metastatic lung cancer - Blood cx showed no growth - Pleural fluid cx grew strep viridans  - Continue rocephin for streptococcal pneumoniae per ID recommendations   Metastatic stage IV lung cancer - Follows with Dr. [name]  - Palliative following for goals of care  - Continue Pulmicort nebulizer twice daily, Xopenex and Atrovent every 6 hours scheduled and Xopenex every 2 hours as needed for shortness of breath or wheezing  Afib with RVR - CHA2DS2-VASc Score 2  - Continue amiodarone - Continue metoprolol  AKI - Secondary to sepsis - Resolved  Diabetes type 2 without complications  - Continue sliding scale insulin  Moderate protein calorie malnutrition - In the setting of chronic illness - Seen by a nutritionist - Continue nutritional supplementation   Skin pressure injury, stage 1 (left armpit) / Stage 2 sacral ulcer  - Wound care  consulted  - Care per RN   DVT prophylaxis: Lovenox subQ Code Status: full code  Family Communication: no family at the bedside Disposition Plan: continue to monitor in SDU   Consultants:   PCT  ID, Dr. Baxter Flattery   CCM. Dr. Halford Chessman  Nutrition  Procedures:   Thoracentesis 12/23 and 12/27  Antimicrobials:   Rocephin -->   Subjective: On venti mask this am. No overnight events.  Objective: Vitals:   05/04/17 0500 05/04/17 0600 05/04/17 0800 05/04/17 0814  BP:  (!) 123/47 (!) 119/52   Pulse: 85 86 87   Resp: (!) 23 (!) 25 (!) 25   Temp: 99 F (37.2 C) 98.8 F (37.1 C) 98.8 F (37.1 C)   TempSrc:      SpO2: 100% 100% 100% 95%  Weight:      Height:        Intake/Output Summary (Last 24 hours) at 05/04/2017 0921 Last data filed at 05/04/2017 0600 Gross per 24 hour  Intake 1640 ml  Output 825 ml  Net 815 ml   Filed Weights   05/02/17 0500 05/03/17 0600 05/04/17 0413  Weight: 77.3 kg (170 lb 6.7 oz) 78.6 kg (173 lb 4.5 oz) 81.7 kg (180 lb 1.9 oz)    Physical Exam  Constitutional: Appears ill, on venti mask CVS: tachycardic, S1/S2 + Pulmonary: diminished breath sounds, wheezing in upper lung lobes  Abdominal: Soft. BS +,  no distension, tenderness, rebound or guarding.  Musculoskeletal: has pedal edema, palpable pulses  Lymphadenopathy: No lymphadenopathy noted, cervical, inguinal. Neuro: Alert. Normal reflexes, muscle tone coordination. No cranial nerve deficit. Skin: Skin is warm and dry.  Psychiatric: Normal mood  and affect. Behavior, judgment, thought content normal.    Data Reviewed: I have personally reviewed following labs and imaging studies  CBC: Recent Labs  Lab 04/30/17 0320 05/01/17 0317 05/02/17 0338 05/03/17 0241 05/04/17 0532  WBC 28.6* 16.9* 12.5* 11.8* 13.1*  NEUTROABS 26.8* 15.4* 11.2 10.4* 11.6*  HGB 13.3 12.1* 11.7* 11.0* 11.6*  HCT 40.8 38.9* 36.9* 34.6* 36.9*  MCV 94.2 95.1 94.1 94.0 94.9  PLT 311 255 262 255 423   Basic  Metabolic Panel: Recent Labs  Lab 04/29/17 0319 04/30/17 0320 05/01/17 0317 05/02/17 0338 05/03/17 0241 05/04/17 0532  NA 141 141 142  --  136 135  K 4.8 5.0 4.1  --  4.0 4.1  CL 112* 113* 111  --  103 102  CO2 22 21* 27  --  26 29  GLUCOSE 216* 176* 88  --  116* 153*  BUN 51* 63* 32*  --  17 16  CREATININE 1.00 1.79* 0.66 0.41* 0.51* 0.43*  CALCIUM 8.4* 8.3* 8.0*  --  7.8* 7.9*   GFR: Estimated Creatinine Clearance: 82.4 mL/min (A) (by C-G formula based on SCr of 0.43 mg/dL (L)). Liver Function Tests: No results for input(s): AST, ALT, ALKPHOS, BILITOT, PROT, ALBUMIN in the last 168 hours. No results for input(s): LIPASE, AMYLASE in the last 168 hours. No results for input(s): AMMONIA in the last 168 hours. Coagulation Profile: No results for input(s): INR, PROTIME in the last 168 hours. Cardiac Enzymes: No results for input(s): CKTOTAL, CKMB, CKMBINDEX, TROPONINI in the last 168 hours. BNP (last 3 results) No results for input(s): PROBNP in the last 8760 hours. HbA1C: No results for input(s): HGBA1C in the last 72 hours. CBG: Recent Labs  Lab 05/03/17 0749 05/03/17 1135 05/03/17 1611 05/03/17 2106 05/04/17 0752  GLUCAP 103* 140* 166* 182* 120*   Lipid Profile: No results for input(s): CHOL, HDL, LDLCALC, TRIG, CHOLHDL, LDLDIRECT in the last 72 hours. Thyroid Function Tests: No results for input(s): TSH, T4TOTAL, FREET4, T3FREE, THYROIDAB in the last 72 hours. Anemia Panel: No results for input(s): VITAMINB12, FOLATE, FERRITIN, TIBC, IRON, RETICCTPCT in the last 72 hours. Urine analysis:    Component Value Date/Time   COLORURINE AMBER (A) 04/26/2017 0311   APPEARANCEUR HAZY (A) 04/26/2017 0311   LABSPEC >1.046 (H) 04/26/2017 0311   PHURINE 5.0 04/26/2017 0311   GLUCOSEU 50 (A) 04/26/2017 0311   HGBUR NEGATIVE 04/26/2017 0311   BILIRUBINUR NEGATIVE 04/26/2017 0311   KETONESUR 5 (A) 04/26/2017 0311   PROTEINUR 30 (A) 04/26/2017 0311   NITRITE NEGATIVE  04/26/2017 0311   LEUKOCYTESUR NEGATIVE 04/26/2017 0311   Sepsis Labs: @LABRCNTIP (procalcitonin:4,lacticidven:4)   Culture, blood (routine x 2)     Status: None   Collection Time: 04/24/17 11:41 PM  Result Value Ref Range Status   Specimen Description BLOOD LEFT ANTECUBITAL  Final    NO GROWTH 5 DAYS   Report Status 04/30/2017 FINAL  Final  MRSA PCR Screening     Status: None   Collection Time: 04/25/17  3:41 PM  Result Value Ref Range Status   MRSA by PCR NEGATIVE NEGATIVE Final  Culture, blood (routine x 2)     Status: None   Collection Time: 04/25/17  4:43 PM  Result Value Ref Range Status   Specimen Description BLOOD RIGHT ANTECUBITAL  Final   Culture   Final    NO GROWTH 5 DAYS   Report Status 05/01/2017 FINAL  Final  Body fluid culture     Status: None  Collection Time: 04/26/17 12:24 PM  Result Value Ref Range Status   Specimen Description PLEURAL LEFT  Final   Report Status 05/02/2017 FINAL  Final   Organism ID, Bacteria VIRIDANS STREPTOCOCCUS  Final      Susceptibility   Viridans streptococcus - MIC*    ERYTHROMYCIN <=0.12 SENSITIVE Sensitive     LEVOFLOXACIN 0.5 SENSITIVE Sensitive     VANCOMYCIN 0.5 SENSITIVE Sensitive     * RARE VIRIDANS STREPTOCOCCUS  Fungus Culture Result     Status: None   Collection Time: 04/26/17 12:24 PM  Result Value Ref Range Status   Result 1 Comment  Final    Comment: (NOTE) KOH/Calcofluor preparation:  no fungus observed.  Culture, body fluid-bottle     Status: None (Preliminary result)   Collection Time: 04/30/17 11:14 AM  Result Value Ref Range Status   Specimen Description FLUID PLEURAL LEFT  Final   Special Requests NONE  Final   Culture   Final    NO GROWTH 3 DAYS   Report Status PENDING  Incomplete  Gram stain     Status: None   Collection Time: 04/30/17 11:14 AM  Result Value Ref Range Status   Specimen Description FLUID PLEURAL LEFT  Final   Special Requests NONE  Final   Gram Stain   Final    NO ORGANISMS SEEN     Report Status 04/30/2017 FINAL  Final      Radiology Studies: Dg Chest 1 View Result Date: 04/29/2017 1. Large left pleural effusion appears to have recurred. Residual left apical pneumothorax may remain. 2. Persistent left lung infiltrate. 3. Stable cardiomegaly . Electronically Signed   By: Marcello Moores  Register   On: 04/29/2017 14:53   Ct Chest W Contrast Result Date: 04/30/2017 1. Worsening bilateral multilobar pneumonia. 2. Large left hydropneumothorax, overall stable in size since 04/24/2017 chest CT, with new left pneumothorax component and with decreased left pleural fluid component status post interval thoracentesis. 3. Infiltrative poorly marginated partially cavitary central left lung malignancy with left hilar and subcarinal mediastinal invasion, apparently mildly increased in size in the interval. Stable occluded left upper lobe bronchus with complete left upper lobe atelectasis. 4. Nonspecific mild left internal mammary and left pericardiophrenic mediastinal adenopathy is mildly increased. 5. New trace dependent right pleural effusion. 6. Cholelithiasis. Aortic Atherosclerosis (ICD10-I70.0) and Emphysema (ICD10-J43.9). Electronically Signed   By: Ilona Sorrel M.D.   On: 04/30/2017 12:37   Dg Chest Port 1 View Result Date: 05/01/2017 1. Stable complex left-sided hydropneumothorax and pneumonia. Electronically Signed   By: San Morelle M.D.   On: 05/01/2017 07:09   Dg Chest Port 1 View Result Date: 04/30/2017 Hydropneumothorax remains on the left without appreciable change. Areas of consolidation primarily in the left mid lung remain. Mass with cavitation in the left perihilar region persists without appreciable change compared to earlier in the day. Patchy infiltrate right mid lung and right base. Stable cardiac silhouette. There is aortic atherosclerosis. Aortic Atherosclerosis (ICD10-I70.0). Electronically Signed   By: Lowella Grip III M.D.   On: 04/30/2017 20:25   Dg  Chest Port 1 View Result Date: 04/30/2017 Stable left pleural effusion and likely component of hydropneumothorax. No new focal abnormality is noted. Stable left-sided infiltrates. Electronically Signed   By: Inez Catalina M.D.   On: 04/30/2017 07:36   US Thoracentesis Asp Pleural Space W/img Guide Result Date: 04/30/2017 Successful ultrasound guided left thoracentesis yielding 2.3 L of pleural fluid. The patient's follow-up CT scan shows a persistent hydropneumothorax  that is stable from imaging several days ago to maybe slightly increased. The patient remains asymptomatic. No intervention is required at this time as it appears his lung is trapped. We will await cultures to determine if this fluid is still infected. If it is, he will likely require a drain placement for continued drainage. If his cultures are negative then he will likely require no further intervention. Read by: Saverio Danker, PA-C Electronically Signed   By: Sandi Mariscal M.D.   On: 04/30/2017 14:00      Scheduled Meds: . amiodarone  400 mg Oral BID  . budesonide (PULMICORT) nebulizer solution  0.25 mg Nebulization BID  . chlorhexidine  15 mL Mouth Rinse BID  . enoxaparin (LOVENOX) injection  40 mg Subcutaneous QHS  . feeding supplement  1 Container Oral BID BM  . insulin aspart  0-9 Units Subcutaneous TID WC  . ipratropium  0.5 mg Nebulization Q6H  . levalbuterol  0.63 mg Nebulization Q6H  . mouth rinse  15 mL Mouth Rinse q12n4p  . metoprolol tartrate  25 mg Oral BID   Continuous Infusions: . sodium chloride 50 mL/hr at 05/03/17 1001  . amiodarone Stopped (04/30/17 1012)  . cefTRIAXone (ROCEPHIN)  IV Stopped (05/03/17 1700)     LOS: 9 days    Time spent: 25 minutes  Greater than 50% of the time spent on counseling and coordinating the care.   Leisa Lenz, MD [facility] Pager [phone]  If 7PM-7AM, please contact night-coverage [email or web address] Password [email or web address] 05/04/2017, 9:21 AM

## 2017-05-04 NOTE — Plan of Care (Signed)
  Nutrition: Adequate nutrition will be maintained 05/04/2017 1103 - Progressing by Dorene Sorrow, RN   Elimination: Will not experience complications related to bowel motility 05/04/2017 1103 - Progressing by Dorene Sorrow, RN   Pain Managment: General experience of comfort will improve 05/04/2017 1103 - Progressing by Dorene Sorrow, RN

## 2017-05-05 DIAGNOSIS — C3492 Malignant neoplasm of unspecified part of left bronchus or lung: Secondary | ICD-10-CM

## 2017-05-05 LAB — CBC WITH DIFFERENTIAL/PLATELET
BASOS PCT: 0 %
Basophils Absolute: 0 10*3/uL (ref 0.0–0.1)
EOS ABS: 0 10*3/uL (ref 0.0–0.7)
Eosinophils Relative: 0 %
HCT: 38.2 % — ABNORMAL LOW (ref 39.0–52.0)
HEMOGLOBIN: 11.8 g/dL — AB (ref 13.0–17.0)
Lymphocytes Relative: 6 %
Lymphs Abs: 1 10*3/uL (ref 0.7–4.0)
MCH: 29.7 pg (ref 26.0–34.0)
MCHC: 30.9 g/dL (ref 30.0–36.0)
MCV: 96.2 fL (ref 78.0–100.0)
MONO ABS: 0.8 10*3/uL (ref 0.1–1.0)
Monocytes Relative: 5 %
NEUTROS ABS: 14.7 10*3/uL — AB (ref 1.7–7.7)
Neutrophils Relative %: 89 %
PLATELETS: 235 10*3/uL (ref 150–400)
RBC: 3.97 MIL/uL — ABNORMAL LOW (ref 4.22–5.81)
RDW: 14.1 % (ref 11.5–15.5)
WBC: 16.5 10*3/uL — ABNORMAL HIGH (ref 4.0–10.5)

## 2017-05-05 LAB — GLUCOSE, CAPILLARY
GLUCOSE-CAPILLARY: 184 mg/dL — AB (ref 65–99)
Glucose-Capillary: 102 mg/dL — ABNORMAL HIGH (ref 65–99)
Glucose-Capillary: 214 mg/dL — ABNORMAL HIGH (ref 65–99)
Glucose-Capillary: 115 mg/dL — ABNORMAL HIGH (ref 65–99)

## 2017-05-05 LAB — BASIC METABOLIC PANEL
Anion gap: 7 (ref 5–15)
BUN: 14 mg/dL (ref 6–20)
CHLORIDE: 103 mmol/L (ref 101–111)
CO2: 26 mmol/L (ref 22–32)
CREATININE: 0.4 mg/dL — AB (ref 0.61–1.24)
Calcium: 7.9 mg/dL — ABNORMAL LOW (ref 8.9–10.3)
GFR calc Af Amer: >60 mL/min (ref >60)
GFR calc non Af Amer: >60 mL/min (ref >60)
GLUCOSE: 131 mg/dL — AB (ref 65–99)
Potassium: 4.5 mmol/L (ref 3.5–5.1)
Sodium: 136 mmol/L (ref 135–145)

## 2017-05-05 LAB — CULTURE, BODY FLUID-BOTTLE

## 2017-05-05 LAB — CULTURE, BODY FLUID W GRAM STAIN -BOTTLE: Culture: NO GROWTH

## 2017-05-05 LAB — MAGNESIUM: Magnesium: 1.8 mg/dL (ref 1.7–2.4)

## 2017-05-05 LAB — ALBUMIN: Albumin: 1.8 g/dL — ABNORMAL LOW (ref 3.5–5.0)

## 2017-05-05 LAB — PHOSPHORUS: Phosphorus: 2.6 mg/dL (ref 2.5–4.6)

## 2017-05-05 MED ORDER — FUROSEMIDE 10 MG/ML IJ SOLN
20.0000 mg | Freq: Once | INTRAMUSCULAR | Status: AC
  Administered 2017-05-05: 20 mg via INTRAVENOUS
  Filled 2017-05-05: qty 2

## 2017-05-05 NOTE — Progress Notes (Addendum)
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 Patient being currently managed for multiofocal pneumonia and left-sided pleural effusion.  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)   Diabetes mellitus type 2 in nonobese (HCC)   Acute respiratory failure with hypoxia and hypercapnia (HCC)   Severe sepsis (HCC)   Malnutrition of moderate degree   Pressure injury of skin   Empyema (HCC)   New onset Zarin Hagmann-fib (HCC)   AKI (acute kidney injury) ([place])   Non-small cell cancer of left lung (HCC)   Goals of care, counseling/discussion   Palliative care encounter   Streptococcal pneumonia ([place])   Acute respiratory failure with hypoxia and hypercarbia / Sepsis secondary to streptococcal multifocal pneumonia / empyema / malignant left side pleural effusion - CXR on admission showed left perihilar soft tissue mass consistent with known malignancy. Moderate left pleural effusion improved compared with 04/08/2017 - Pt is status post thoracentesis 12/23 - 1.4 L fluid removed; thoracentesis 12/27 - 2.3 L fluid removed; likely malignant pleural effusion in the setting of metastatic lung cancer [ ]  CXR from 12/31 showed redemonstrated large L hydropneumothorax, PCCM recommending IR guided drainage (discussed with IR today, will need to call IR tomorrow to discuss timing and plan for procedure) - Blood cx showed no growth - Pleural fluid cx grew strep viridans  - Continue rocephin for streptococcal pneumoniae per ID recommendations   Leukocytosis: due to above, trending up, ctm    Edema: to bilateral upper and lower extremities, for now will ctm. Consider adding lasix.    Metastatic stage IV lung cancer - Follows with Dr. [name]  - Palliative following for goals of care  - Continue Pulmicort nebulizer twice daily, Xopenex and Atrovent every 6 hours scheduled and Xopenex every 2 hours as needed for shortness of breath or wheezing  Afib with RVR - CHA2DS2-VASc Score 2  - Continue amiodarone - Continue metoprolol - cards saw, recommended against anticoagulation given new afib and significant commorbidities (see 12/27 cards c/s note)  AKI - Secondary to sepsis - Resolved  Diabetes type 2 without complications  - Continue sliding scale insulin  Moderate protein calorie malnutrition - In the setting of chronic illness - Seen by Miarose Lippert nutritionist - Continue nutritional supplementation   Skin pressure injury, stage 1 (left armpit) / Stage 2 sacral ulcer  - Wound care consulted  - Care per RN  Foley in place: remove and trial of void today  DVT prophylaxis: lovenox Code Status: full  Family Communication: none at bedside Disposition Plan: pending    Consultants:   IR  PCCM  ID  Palliative   Procedures: (Don't include imaging studies which can be auto populated. Include things that cannot be auto populated i.e. Echo, Carotid and venous dopplers, Foley, Bipap, HD, tubes/drains, wound vac, central lines etc)  Thoracentesis 12/23 and 12/27  Antimicrobials: (specify start and planned stop date. Auto populated tables are space occupying and do not give end dates) Anti-infectives (From admission, onward)   Start     Dose/Rate Route Frequency Ordered Stop   04/29/17 1200  cefTRIAXone (ROCEPHIN) 2 g  in dextrose 5 % 50 mL IVPB     2 g 100 mL/hr over 30 Minutes Intravenous Every 24 hours 04/29/17 1100     04/25/17 1200  vancomycin (VANCOCIN) IVPB 750 mg/150 ml premix  Status:  Discontinued     750 mg 150 mL/hr over 60 Minutes Intravenous Every 12  hours 04/25/17 0835 04/27/17 1406   04/25/17 1000  ceFEPIme (MAXIPIME) 1 g in dextrose 5 % 50 mL IVPB  Status:  Discontinued     1 g 100 mL/hr over 30 Minutes Intravenous Every 8 hours 04/25/17 0835 04/29/17 1100   04/25/17 0015  ceFEPIme (MAXIPIME) 2 g in dextrose 5 % 50 mL IVPB     2 g 100 mL/hr over 30 Minutes Intravenous  Once 04/25/17 0001 04/25/17 0119   04/25/17 0015  vancomycin (VANCOCIN) IVPB 1000 mg/200 mL premix     1,000 mg 200 mL/hr over 60 Minutes Intravenous  Once 04/25/17 0001 04/25/17 0259   04/25/17 0015  azithromycin (ZITHROMAX) 500 mg in dextrose 5 % 250 mL IVPB  Status:  Discontinued     500 mg 250 mL/hr over 60 Minutes Intravenous Daily at bedtime 04/25/17 0009 04/29/17 1100     Subjective: Feels subjectively better. Still some SOB.  Objective: Vitals:   05/05/17 0200 05/05/17 0400 05/05/17 0441 05/05/17 0600  BP: (!) 123/57 (!) 130/57  (!) 115/53  Pulse: 92 91  92  Resp: (!) 24 (!) 27  (!) 28  Temp: 99 F (37.2 C) 99 F (37.2 C)  99 F (37.2 C)  TempSrc:    Bladder  SpO2: 100% 100%  94%  Weight:   82.1 kg (180 lb 16 oz)   Height:        Intake/Output Summary (Last 24 hours) at 05/05/2017 0744 Last data filed at 05/05/2017 0600 Gross per 24 hour  Intake 1530 ml  Output 800 ml  Net 730 ml   Filed Weights   05/03/17 0600 05/04/17 0413 05/05/17 0441  Weight: 78.6 kg (173 lb 4.5 oz) 81.7 kg (180 lb 1.9 oz) 82.1 kg (180 lb 16 oz)    Examination:  General exam: Appears calm and comfortable  Respiratory system: Decreased lung sounds at bases.  Slightly increased WOB. Cardiovascular system: S1 & S2 heard, RRR. No JVD, murmurs, rubs, gallops or clicks.  Gastrointestinal system: Abdomen is nondistended, soft and nontender. No organomegaly or masses felt. Normal bowel sounds heard. Central nervous system: Alert and oriented. No focal neurological deficits. Extremities: Pitting edema to bilateral arms and legs Skin: No rashes, lesions or  ulcers Psychiatry: Judgement and insight appear normal. Mood & affect appropriate.     Data Reviewed: I have personally reviewed following labs and imaging studies  CBC: Recent Labs  Lab 05/01/17 0317 05/02/17 0338 05/03/17 0241 05/04/17 0532 05/05/17 0333  WBC 16.9* 12.5* 11.8* 13.1* 16.5*  NEUTROABS 15.4* 11.2 10.4* 11.6* 14.7*  HGB 12.1* 11.7* 11.0* 11.6* 11.8*  HCT 38.9* 36.9* 34.6* 36.9* 38.2*  MCV 95.1 94.1 94.0 94.9 96.2  PLT 255 262 255 225 329   Basic Metabolic Panel: Recent Labs  Lab 04/30/17 0320 05/01/17 0317 05/02/17 0338 05/03/17 0241 05/04/17 0532 05/05/17 0333  NA 141 142  --  136 135 136  K 5.0 4.1  --  4.0 4.1 4.5  CL 113* 111  --  103 102 103  CO2 21* 27  --  26 29 26   GLUCOSE 176* 88  --  116* 153* 131*  BUN 63* 32*  --  17 16 14   CREATININE 1.79* 0.66 0.41* 0.51* 0.43* 0.40*  CALCIUM 8.3* 8.0*  --  7.8* 7.9* 7.9*  MG  --   --   --   --   --  1.8  PHOS  --   --   --   --   --  2.6   GFR: Estimated Creatinine Clearance: 82.4 mL/min (Cliffie Gingras) (by C-G formula based on SCr of 0.4 mg/dL (L)). Liver Function Tests: No results for input(s): AST, ALT, ALKPHOS, BILITOT, PROT, ALBUMIN in the last 168 hours. No results for input(s): LIPASE, AMYLASE in the last 168 hours. No results for input(s): AMMONIA in the last 168 hours. Coagulation Profile: No results for input(s): INR, PROTIME in the last 168 hours. Cardiac Enzymes: No results for input(s): CKTOTAL, CKMB, CKMBINDEX, TROPONINI in the last 168 hours. BNP (last 3 results) No results for input(s): PROBNP in the last 8760 hours. HbA1C: No results for input(s): HGBA1C in the last 72 hours. CBG: Recent Labs  Lab 05/03/17 2106 05/04/17 0752 05/04/17 1229 05/04/17 1611 05/04/17 2109  GLUCAP 182* 120* 168* 127* 122*   Lipid Profile: No results for input(s): CHOL, HDL, LDLCALC, TRIG, CHOLHDL, LDLDIRECT in the last 72 hours. Thyroid Function Tests: No results for input(s): TSH, T4TOTAL, FREET4,  T3FREE, THYROIDAB in the last 72 hours. Anemia Panel: No results for input(s): VITAMINB12, FOLATE, FERRITIN, TIBC, IRON, RETICCTPCT in the last 72 hours. Sepsis Labs: No results for input(s): PROCALCITON, LATICACIDVEN in the last 168 hours.  Recent Results (from the past 240 hour(s))  MRSA PCR Screening     Status: None   Collection Time: 04/25/17  3:41 PM  Result Value Ref Range Status   MRSA by PCR NEGATIVE NEGATIVE Final    Comment:        The GeneXpert MRSA Assay (FDA approved for NASAL specimens only), is one component of [facility] comprehensive MRSA colonization surveillance program. It is not intended to diagnose MRSA infection nor to guide or monitor treatment for MRSA infections.   Culture, blood (routine x 2)     Status: None   Collection Time: 04/25/17  4:43 PM  Result Value Ref Range Status   Specimen Description BLOOD RIGHT ANTECUBITAL  Final   Special Requests IN PEDIATRIC BOTTLE Blood Culture adequate volume  Final   Culture   Final    NO GROWTH 5 DAYS Performed at [facility], [place] [address], [place], [place] [address]    Report Status 05/01/2017 FINAL  Final  Body fluid culture     Status: None   Collection Time: 04/26/17 12:24 PM  Result Value Ref Range Status   Specimen Description PLEURAL LEFT  Final   Special Requests NONE  Final   Gram Stain   Final    ABUNDANT WBC PRESENT, PREDOMINANTLY PMN MODERATE GRAM POSITIVE COCCI IN PAIRS IN CHAINS Performed at [facility], [place] [address] [place], [place] [address]    Culture RARE VIRIDANS STREPTOCOCCUS  Final   Report Status 05/02/2017 FINAL  Final   Organism ID, Bacteria VIRIDANS STREPTOCOCCUS  Final      Susceptibility   Viridans streptococcus - MIC*    ERYTHROMYCIN <=0.12 SENSITIVE Sensitive     LEVOFLOXACIN 0.5 SENSITIVE Sensitive     VANCOMYCIN 0.5 SENSITIVE Sensitive     * RARE VIRIDANS STREPTOCOCCUS  Fungus Culture With Stain     Status: None (Preliminary result)   Collection Time:  04/26/17 12:24 PM  Result Value Ref Range Status  Fungus Stain Final report  Final    Comment: (NOTE) Performed At: [facility] [place], [place] [id number] Rush Farmer MD FH:[id number]    Fungus (Mycology) Culture PENDING  Incomplete   Fungal Source PLEURAL  Final    Comment: LEFT  Fungus Culture Result     Status: None   Collection Time: 04/26/17 12:24 PM  Result Value Ref Range Status   Result 1 Comment  Final    Comment: (NOTE) KOH/Calcofluor preparation:  no fungus observed. Performed At: [facility] [address] [place], [place] [id number] Rush Farmer MD TL:[id number]   Culture, body fluid-bottle     Status: None (Preliminary result)   Collection Time: 04/30/17 11:14 AM  Result Value Ref Range Status   Specimen Description FLUID PLEURAL LEFT  Final   Special Requests NONE  Final   Culture   Final    NO GROWTH 4 DAYS Performed at [facility] [address] [place], [place] [address]    Report Status PENDING  Incomplete  Gram stain     Status: None   Collection Time: 04/30/17 11:14 AM  Result Value Ref Range Status   Specimen Description FLUID PLEURAL LEFT  Final   Special Requests NONE  Final   Gram Stain   Final    ABUNDANT WBC PRESENT, PREDOMINANTLY PMN NO ORGANISMS SEEN Performed at [facility], [place] [address], [place], [place] [address]    Report Status 04/30/2017 FINAL  Final         Radiology Studies: Dg Chest Port 1 View  Result Date: 05/04/2017 CLINICAL DATA:  Patient with history of loculated pleural effusion. EXAM: PORTABLE CHEST 1 VIEW COMPARISON:  Chest radiograph 05/01/2017. FINDINGS: Monitoring leads overlie the patient. Stable cardiomegaly. Re- demonstrated loculated left hydropneumothorax. Residual left lung is largely obscured. Right basilar opacities favored to represent atelectasis. Thoracic spine degenerative changes. IMPRESSION: Re- demonstrated large left hydropneumothorax. Residual  left parenchymal tissue is largely obscured. Electronically Signed   By: Lovey Newcomer M.D.   On: 05/04/2017 11:44        Scheduled Meds: . amiodarone  400 mg Oral BID  . budesonide (PULMICORT) nebulizer solution  0.25 mg Nebulization BID  . chlorhexidine  15 mL Mouth Rinse BID  . enoxaparin (LOVENOX) injection  40 mg Subcutaneous QHS  . feeding supplement  1 Container Oral BID BM  . Gerhardt's butt cream   Topical TID  . insulin aspart  0-9 Units Subcutaneous TID WC  . ipratropium  0.5 mg Nebulization Q6H  . levalbuterol  0.63 mg Nebulization Q6H  . mouth rinse  15 mL Mouth Rinse q12n4p  . metoprolol tartrate  25 mg Oral BID   Continuous Infusions: . sodium chloride 50 mL/hr at 05/04/17 2000  . amiodarone Stopped (04/30/17 1012)  . cefTRIAXone (ROCEPHIN)  IV Stopped (05/04/17 1330)     LOS: 10 days    Time spent: over 30 minutes    Fayrene Helper, MD [facility] Pager [phone]  If 7PM-7AM, please contact night-coverage [email or web address] Password [facility] 05/05/2017, 7:44 AM

## 2017-05-05 NOTE — Progress Notes (Signed)
Pt's urinary catheter removed at 1019. Pt is still due to void and denies feeling the urge to urinate. Bladder scan showed 59 mL in bladder. NP on call, [name], made aware. Will continue to monitor pt closely and carry out any new orders. Maurice Tyler I

## 2017-05-05 NOTE — Progress Notes (Addendum)
Daily Progress Note   Patient Name: Maurice Tyler       Date: 05/05/2017 DOB: November 13, 1940  Age: 77 y.o. MRN#: [id number] Attending Physician: Patrecia Pour, Christean Grief, MD Primary Care Physician: [name], MD Admit Date: 04/24/2017  Reason for Consultation/Follow-up: Establishing goals of care  Subjective: [name] is resting today. No family at bedside. Breathing more labored on 9L oxygen, fatigued.   Length of Stay: 10  Current Medications: Scheduled Meds:  . amiodarone  400 mg Oral BID  . budesonide (PULMICORT) nebulizer solution  0.25 mg Nebulization BID  . chlorhexidine  15 mL Mouth Rinse BID  . enoxaparin (LOVENOX) injection  40 mg Subcutaneous QHS  . feeding supplement  1 Container Oral BID BM  . Gerhardt's butt cream   Topical TID  . insulin aspart  0-9 Units Subcutaneous TID WC  . ipratropium  0.5 mg Nebulization Q6H  . levalbuterol  0.63 mg Nebulization Q6H  . mouth rinse  15 mL Mouth Rinse q12n4p  . metoprolol tartrate  25 mg Oral BID    Continuous Infusions: . sodium chloride 50 mL/hr at 05/05/17 0925  . amiodarone Stopped (04/30/17 1012)  . cefTRIAXone (ROCEPHIN)  IV Stopped (05/04/17 1330)    PRN Meds: acetaminophen **OR** acetaminophen, levalbuterol, loperamide, LORazepam, metoprolol tartrate, ondansetron **OR** ondansetron (ZOFRAN) IV, prochlorperazine  Physical Exam         Constitutional: Vital signs are normal. He has a sickly appearance.  Thin, frail  Cardiovascular: Normal rate and regular rhythm.  Pulmonary/Chest: No accessory muscle usage. No tachypnea. No respiratory distress. He has decreased breath sounds in the left upper field, the left middle field and the left lower field. More labored today.  Abdominal: Soft. Normal appearance.    Neurological: Alert, oriented x 4  Nursing note and vitals reviewed.   Vital Signs: BP (!) 112/51 (BP Location: Left Arm)   Pulse 60   Temp 99.1 F (37.3 C)   Resp (!) 23   Ht _0  (1.803 m)   Wt 82.1 kg (180 lb 16 oz)   SpO2 95%   BMI 25.24 kg/m  SpO2: SpO2: 95 % O2 Device: O2 Device: High Flow Nasal Cannula O2 Flow Rate: O2 Flow Rate (L/min): 8 L/min  Intake/output summary:   Intake/Output Summary (Last 24 hours) at 05/05/2017 1030 Last data  filed at 05/05/2017 0600 Gross per 24 hour  Intake 1430 ml  Output 800 ml  Net 630 ml   LBM: Last BM Date: 05/02/17 Baseline Weight: Weight: 69.9 kg (154 lb 1.6 oz) Most recent weight: Weight: 82.1 kg (180 lb 16 oz)       Palliative Assessment/Data: 30%   Flowsheet Rows     Most Recent Value  Intake Tab  Referral Department  Hospitalist  Unit at Time of Referral  Med/Surg Unit  Palliative Care Primary Diagnosis  Sepsis/Infectious Disease  Date Notified  04/29/17  Palliative Care Type  New Palliative care  Reason for referral  Clarify Goals of Care, Counsel Regarding Hospice  Date of Admission  04/24/17  Date first seen by Palliative Care  04/30/17  # of days Palliative referral response time  1 Day(s)  # of days IP prior to Palliative referral  5  Clinical Assessment  Psychosocial & Spiritual Assessment  Palliative Care Outcomes      Patient Active Problem List   Diagnosis Date Noted  . Streptococcal pneumonia ([place])   . New onset a-fib ([place]) 04/30/2017  . AKI (acute kidney injury) ([place]) 04/30/2017  . Non-small cell cancer of left lung ([place])   . Goals of care, counseling/discussion   . Palliative care encounter   . Empyema ([place]) 04/29/2017  . Malnutrition of moderate degree 04/28/2017  . Pressure injury of skin 04/28/2017  . Diabetes mellitus type 2 in nonobese ([place]) 04/25/2017  . Acute respiratory failure with hypoxia and hypercapnia ([place]) 04/25/2017  . Severe sepsis ([place]) 04/25/2017  . Multifocal pneumonia  04/25/2017  . Malignant neoplasm of bronchus of left upper lobe ([place]) 03/30/2017  . Stage IV squamous cell carcinoma of left lung ([place]) 03/30/2017    Palliative Care Assessment & Plan   HPI: 77 y.o. male  with past medical history of diabetes mellitus type II, stage IV NSCLC with obstructive LUL mass as well as mediastinal and left hilar adenopathy diagnosed recently with 1 treatment with carboplatin, paclitaxel and Keytruda 04/15/17 admitted on 04/24/2017 with worsening shortness of breath along with productive cough, diarrhea, and failure to thrive with significant weight loss.   Assessment: I met again today with [name]. No family present. He is exhausted and breathing a little more labored. Finishing an Ensure. [name] continues to struggle with his poor prognosis and severity of his illness. He tells me "I've never been sick in my life" and figures he is making up for it now. We discussed the difficulties of dealing with serious chronic illness and losing independence. We spoke briefly about the struggle with treating this pneumonia and why this is so difficult. He is requiring more oxygen over the weekend than last week when I met him. He is exhausted and did not sleep well last night (says the alarms and beeping kept him up last night). He tells me that he is tired and desires to take a nap. Emotional support provided and will continue to build rapport. Encouraged him to call me if any questions/concerns and that I will visit with him again tomorrow.   Recommendations/Plan:  Per primary and PCCM. IR, PCCM, ID to discuss thoracentesis vs drain placement.   Denies SOB, pain, or any discomfort.   Goals of Care and Additional Recommendations:  Limitations on Scope of Treatment: Full Scope Treatment  Code Status:  Full code  Prognosis:   Poor with stage IV NSCLC complicated by pneumococcal pneumonia, empyema, and failure to thrive   Discharge  Planning:  To Be  Determined  Thank you for allowing the Palliative Medicine Team to assist in the care of this patient.   Total Time 25 min Prolonged Time Billed  no       Greater than 50%  of this time was spent counseling and coordinating care related to the above assessment and plan.  Vinie Sill, NP Palliative Medicine Team Pager # [phone] (M-F 8a-5p) Team Phone # [phone] (Nights/Weekends)

## 2017-05-06 ENCOUNTER — Ambulatory Visit
Admission: RE | Admit: 2017-05-06 | Discharge: 2017-05-06 | Disposition: A | Discharge status: Home / Self Care | Payer: Medicare Other | Source: Ambulatory Visit | Attending: Radiation Oncology | Admitting: Radiation Oncology

## 2017-05-06 ENCOUNTER — Inpatient Hospital Stay (HOSPITAL_COMMUNITY): Payer: Medicare Other

## 2017-05-06 ENCOUNTER — Ambulatory Visit: Payer: TRICARE For Life (TFL) | Admitting: Nurse Practitioner

## 2017-05-06 ENCOUNTER — Ambulatory Visit: Payer: Medicare Other

## 2017-05-06 ENCOUNTER — Other Ambulatory Visit: Payer: TRICARE For Life (TFL)

## 2017-05-06 ENCOUNTER — Encounter: Payer: Medicare Other | Admitting: Nutrition

## 2017-05-06 ENCOUNTER — Ambulatory Visit: Payer: TRICARE For Life (TFL)

## 2017-05-06 LAB — CBC WITH DIFFERENTIAL/PLATELET
BASOS PCT: 0 %
Basophils Absolute: 0 10*3/uL (ref 0.0–0.1)
Eosinophils Absolute: 0 10*3/uL (ref 0.0–0.7)
Eosinophils Relative: 0 %
HCT: 37 % — ABNORMAL LOW (ref 39.0–52.0)
HEMOGLOBIN: 11.7 g/dL — AB (ref 13.0–17.0)
LYMPHS ABS: 1.2 10*3/uL (ref 0.7–4.0)
LYMPHS PCT: 7 %
MCH: 29.9 pg (ref 26.0–34.0)
MCHC: 31.6 g/dL (ref 30.0–36.0)
MCV: 94.6 fL (ref 78.0–100.0)
MONO ABS: 0.7 10*3/uL (ref 0.1–1.0)
MONOS PCT: 4 %
NEUTROS ABS: 14.3 10*3/uL (ref 1.7–7.7)
NEUTROS PCT: 89 %
Platelets: 244 10*3/uL (ref 150–400)
RBC: 3.91 MIL/uL — ABNORMAL LOW (ref 4.22–5.81)
RDW: 14.1 % (ref 11.5–15.5)
WBC: 16.2 10*3/uL — ABNORMAL HIGH (ref 4.0–10.5)

## 2017-05-06 LAB — COMPREHENSIVE METABOLIC PANEL
ALBUMIN: 1.7 g/dL — AB (ref 3.5–5.0)
ALT: 32 U/L (ref 17–63)
AST: 16 U/L (ref 15–41)
Alkaline Phosphatase: 184 U/L — ABNORMAL HIGH (ref 38–126)
Anion gap: 7 (ref 5–15)
BILIRUBIN TOTAL: 0.6 mg/dL (ref 0.3–1.2)
BUN: 19 mg/dL (ref 6–20)
CO2: 28 mmol/L (ref 22–32)
CREATININE: 0.38 mg/dL — AB (ref 0.61–1.24)
Calcium: 7.8 mg/dL — ABNORMAL LOW (ref 8.9–10.3)
Chloride: 102 mmol/L (ref 101–111)
GFR calc Af Amer: >60 mL/min (ref >60)
GLUCOSE: 178 mg/dL — AB (ref 65–99)
POTASSIUM: 4.1 mmol/L (ref 3.5–5.1)
Sodium: 137 mmol/L (ref 135–145)
TOTAL PROTEIN: 4.8 g/dL — AB (ref 6.5–8.1)

## 2017-05-06 LAB — GLUCOSE, PLEURAL OR PERITONEAL FLUID

## 2017-05-06 LAB — BODY FLUID CELL COUNT WITH DIFFERENTIAL
MONOCYTE-MACROPHAGE-SEROUS FLUID: 14 % — AB (ref 50–90)
Neutrophil Count, Fluid: 86 % — ABNORMAL HIGH (ref 0–25)
WBC FLUID: 19577 uL — AB (ref 0–1000)

## 2017-05-06 LAB — AMYLASE, PLEURAL OR PERITONEAL FLUID: Amylase, Fluid: <5 U/L

## 2017-05-06 LAB — ALBUMIN, PLEURAL OR PERITONEAL FLUID

## 2017-05-06 LAB — PROTEIN, PLEURAL OR PERITONEAL FLUID: Total protein, fluid: <3 g/dL

## 2017-05-06 LAB — MAGNESIUM: MAGNESIUM: 1.8 mg/dL (ref 1.7–2.4)

## 2017-05-06 LAB — LACTATE DEHYDROGENASE, PLEURAL OR PERITONEAL FLUID: LD, Fluid: 4204 U/L — ABNORMAL HIGH (ref 3–23)

## 2017-05-06 LAB — GLUCOSE, CAPILLARY
Glucose-Capillary: 123 mg/dL — ABNORMAL HIGH (ref 65–99)
Glucose-Capillary: 162 mg/dL — ABNORMAL HIGH (ref 65–99)
Glucose-Capillary: 173 mg/dL — ABNORMAL HIGH (ref 65–99)

## 2017-05-06 LAB — GRAM STAIN

## 2017-05-06 LAB — PHOSPHORUS: Phosphorus: 2.6 mg/dL (ref 2.5–4.6)

## 2017-05-06 MED ORDER — FUROSEMIDE 10 MG/ML IJ SOLN
20.0000 mg | Freq: Every day | INTRAMUSCULAR | Status: DC
  Administered 2017-05-06 – 2017-05-19 (×14): 20 mg via INTRAVENOUS
  Filled 2017-05-06 (×15): qty 2

## 2017-05-06 MED ORDER — LIDOCAINE HCL 1 % IJ SOLN
INTRAMUSCULAR | Status: AC
  Filled 2017-05-06: qty 10

## 2017-05-06 MED ORDER — ALBUMIN HUMAN 5 % IV SOLN
12.5000 g | Freq: Once | INTRAVENOUS | Status: AC
  Administered 2017-05-06: 12.5 g via INTRAVENOUS
  Filled 2017-05-06: qty 250

## 2017-05-06 NOTE — Progress Notes (Addendum)
Palliative:  I met briefly with Mr. Mcguiness. He appears much worse today. He has had radiation therapy and is very exhausted. Now on NRB. Plans for thoracentesis this afternoon per RN. He is very tired right now. We agree to speak again in the morning. He continues to struggle with poor prognosis. Emotional support provided.   I did speak briefly with his daughter, Lambert Keto, and we had a very frank conversation. I did share that we are not really making any progress with his pneumonia and I do not see this getting any better. She is trying to take care of everyone else in the family as well. She is worried about her mother having denial. I have offered to meet with her mother one on one or call her if this would be helpful. Lambert Keto also agrees with my assessment to continue conversations with her father without family there as she feels he will want to just protect them. I shared that I plan to speak with her father more in the morning about poor prognosis. She has good understanding and will let me know anything else I can be doing to help support her father and family.   15 min  Vinie Sill, NP Palliative Medicine Team Pager # [phone] (M-F 8a-5p) Team Phone # [phone] (Nights/Weekends)

## 2017-05-06 NOTE — Progress Notes (Signed)
Spoke with Dr. Chase Caller.  Prior to proceeding with chest drain placement, we will proceed with another thoracentesis and send for glucose and LDH, etc.  If the glucose returns very low and there is still a concern for infection, then he would like to discuss proceeding with drain placement.  Maurice Tyler 11:14 AM 05/06/2017

## 2017-05-06 NOTE — Progress Notes (Signed)
[facility] Therapy Treatment Record Phone [phone]   Radiation Therapy was administered to Maurice Tyler on: 05/06/2017  9:41 AM and was treatment # 8 out of a planned course of 15 treatments.  Radiation Treatment  1). Beam Photons 10-19 MeV  2). Brachytherapy None  3). Stereotactic Radiosurgery None  4). Other Radiation None     Jacques Earthly, RT (T)

## 2017-05-06 NOTE — Progress Notes (Signed)
[facility] Date of Admission: 04/24/2017 Date of Consult: 04/25/2017 Referring Provider: Dr. Hal Hope, [facility] Chief Complaint: Short of breath  HPI: 77 yo male former smoker presented with dyspnea, hypoxia, and productive cough.  He has hx of Stage IV NSCLC (squamous cell) and started chemotherapy Marita [place], carboplatin, paclitaxel) 3 weeks prior to admission.  He was found to have multifocal pneumonia and large Lt pleural effusion.     Subjective: No significant change.  Continues to require high levels of FiO2 with intermittent use of nonrebreather.  Recent Labs  Lab 05/04/17 0532 05/05/17 0333 05/06/17 0316  NA 135 136 137  K 4.1 4.5 4.1  CL 102 103 102  CO2 29 26 28   BUN 16 14 19   CREATININE 0.43* 0.40* 0.38*  GLUCOSE 153* 131* 178*   Recent Labs  Lab 05/04/17 0532 05/05/17 0333 05/06/17 0316  HGB 11.6* 11.8* 11.7*  HCT 36.9* 38.2* 37.0*  WBC 13.1* 16.5* 16.2*  PLT 225 235 244   Dg Chest Port 1 View  Result Date: 05/04/2017 CLINICAL DATA:  Patient with history of loculated pleural effusion. EXAM: PORTABLE CHEST 1 VIEW COMPARISON:  Chest radiograph 05/01/2017. FINDINGS: Monitoring leads overlie the patient. Stable cardiomegaly. Re- demonstrated loculated left hydropneumothorax. Residual left lung is largely obscured. Right basilar opacities favored to represent atelectasis. Thoracic spine degenerative changes. IMPRESSION: Re- demonstrated large left hydropneumothorax. Residual left parenchymal tissue is largely obscured. Electronically Signed   By: Lovey Newcomer M.D.   On: 05/04/2017 11:44    BP (!) 118/56   Pulse 99   Temp (!) 97.4 F (36.3 C) (Axillary)   Resp (!) 27   Ht 5\' 11"  (1.803 m)   Wt 82.1 kg (180 lb 16 oz)   SpO2 100%   BMI 25.24 kg/m    Filed Weights   05/03/17 0600 05/04/17 0413 05/05/17 0441  Weight: 78.6 kg (173 lb 4.5 oz) 81.7 kg (180 lb 1.9 oz) 82.1 kg (180 lb 16 oz)     Intake/Output Summary (Last 24 hours) at  05/06/2017 0955 Last data filed at 05/06/2017 0906 Gross per 24 hour  Intake 1390 ml  Output 1400 ml  Net -10 ml   Cultures: Blood 12/21 >>  Blood 12/22 >>  Pneumococcal Ag 12/22 >> positive Legionella Ag 12/22 >> negative Lt pleural fluid >> GPC in chains  Antibiotics: Vancomycin 12/21 >> 12/24  Cefepime 12/21 >> 12/25 Zithromax 12/21 >> 12/25 Rocephin 12/26 >>  Studies: CT angio chest 12/21 >> Lt mediastinal mass with compression of Lt main bronchus and Lt PA, large Lt effusion, ASD on Lt, patchy ASD on Rt, emphysema Echo 12/22 >> EF 60 to 65%, PAS 62 mmHg Lt thoracentesis 12/23 >> 1.4 liters, glucose < 20, protein < 3, WBC 78,475 (93%N)   Events: 12/22 Admit 05/06/2017 Rtx oncology continues 1/2 IR calliing concerning need for placement of pleural drain?  Lines/tubes:  Consults: 12/26 ID 12/26 Oncology 12/26 Cardiology 12/26 Palliative care   General: Frail elderly male in no acute distress at rest HEENT: No JVD or lymphadenopathy appreciated PSY: Dull effect follows commands moves all extremities x4 weekly Neuro:  CV: Heart sounds are regular PULM: Decreased air movement bilaterally.  Decreased breath sounds left base GI: Positive bowel sounds Extremities: 2-3+ pitting edema lower extremities Skin: no rashes or lesions  Assessment/Plan:  Acute respiratory failure with hypoxia, hypercapnia. - oxygen to keep SpO2 90 to 95% - Bipap prn  Sepsis from multifocal pneumonia with empyema. - ABx per ID -  IR to arrange for repeat Lt thoracentesis and repeat fluid analysis - depending on repeat fluid analysis will determine if he needs to have pig tail catheter placed - he is not a candidate for surgical intervention of pleural space   Emphysema with hx of tobacco abuse. - scheduled BDs  A fib with RVR. - per cardiology and primary team  Stage 4 NSCLC. - agree with plan for palliative care consultation -Currently receiving radiation therapy per  radiation oncology  DVT prophylaxis: Lovenox SUP: Not indicated Diet: Carb modified, heart healthy Goals of care: Full code.  Palliative care consulted.   Richardson Landry Raeshaun Simson ACNP Maryanna Shape PCCM Pager [phone] till 1 pm If no answer page [phone] 05/06/2017, 10:01 AM

## 2017-05-06 NOTE — Progress Notes (Signed)
Wife at [facility] and aware of transferring to 32.Dr Quincy Simmonds called her for update.

## 2017-05-06 NOTE — Progress Notes (Addendum)
PT son, Saralyn Pilar would like to talk to palliative MD again.  Son would like palliative MD to call when able:  Sebastian Lurz [phone])  or [phone] (office)

## 2017-05-06 NOTE — Progress Notes (Signed)
To radiation onc

## 2017-05-06 NOTE — Progress Notes (Signed)
Back from rad/onc

## 2017-05-06 NOTE — Procedures (Signed)
Ultrasound-guided diagnostic and therapeutic left thoracentesis performed yielding 0.5 liters of turbid yellow/brown colored fluid. No immediate complications. Follow-up chest x-ray pending, already has a trapped lung with hydroPTX.  Patient's collection today appeared complex today.  Henreitta Cea 2:52 PM 05/06/2017

## 2017-05-06 NOTE — Progress Notes (Signed)
To IR for thora

## 2017-05-06 NOTE — Progress Notes (Signed)
Report called to [facility] transferring to room [phone]

## 2017-05-06 NOTE — Progress Notes (Signed)
PROGRESS NOTE [facility]   Jeancarlo Leffler   FXT:[id number] DOB: 1940/05/12  DOA: 04/24/2017 PCP: [name], MD   Brief Narrative:  Maurice Tyler is a 77 year old male with past medical history of stage IV non-small cell lung cancer favoring squamous cell carcinoma of left upper lung, diabetes type 2, on chemotherapy, who presented to the emergency department with shortness of breath and productive cough. Patient being currently managed for multiofocal pneumonia and left-sided pleural effusion.  Subjective: Patient seen and examined, he breathing is ok. No other concerns at this time. No acute events overnight.   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)   Diabetes mellitus type 2 in nonobese (HCC)   Acute respiratory failure with hypoxia and hypercapnia (HCC)   Severe sepsis (HCC)   Malnutrition of moderate degree   Pressure injury of skin   Empyema (HCC)   New onset a-fib (HCC)   AKI (acute kidney injury) ([place])   Non-small cell cancer of left lung (HCC)   Goals of care, counseling/discussion   Palliative care encounter   Streptococcal pneumonia ([place])   Malignant neoplasm of left lung ([place])  Acute respiratory failure with hypoxia and hypercarbia / Sepsis secondary to streptococcal multifocal pneumonia / empyema vs malignant left side pleural effusion - CXR on admission showed left perihilar soft tissue mass consistent with known malignancy. - Pt is status post thoracentesis 12/23 - 1.4 L fluid removed; thoracentesis 12/27 - 2.3 L fluid removed; likely malignant pleural effusion in the setting of metastatic lung cancer - CXR shows re-accumulations of hydropneumothorax, suspect malignant vs bacterial as previous thoracentesis yield low glucose. PCCM and IR arranging for possible drainage with pig tail   - Blood cx showed no growth - Pleural fluid cx grew strep  viridans  - Continue rocephin for streptococcal pneumoniae per ID recommendations for minimum of 2 weeks   Leukocytosis: due to above, trending up, ctm   Edema  Hypoproteinemia, 3rd spacing. Will add low dose Lasix with albumin   Metastatic stage IV lung cancer - Follows with Dr. [name] - Palliative following for goals of care  -For radiation today - May need therapeutic lung drainage   Afib with RVR - stable  - CHA2DS2-VASc Score 2 -Continue amiodarone -Continue metoprolol - cards saw, recommended against anticoagulation given new afib and significant commorbidities (see 12/27 cards c/s note)  AKI - Resolved  -Secondary to sepsis - Continue to monitor   Diabetes type 2 without complications- Stable  -SSI  - Monitor CBG's   Moderate protein calorie malnutrition -In the setting of chronic illness -Per nutritionist recommendations   Skin pressure injury, stage 1(left armpit) / Stage 2 sacral ulcer - Wound care consulted - Care per RN  Urinary retention  Unclear etiology, ? Hypoperfusion from 3rd spacing, will add Lasix Continue I&O cath and bladder scan  If no improvement and pending goals of care will get urology consult   DVT prophylaxis: Lovenox  Code Status: Full  Family Communication: None at bedside  Disposition Plan: TBD   Consultants:   IR  PCCM  ID   Palliative   Procedures:   Thoracentesis 12/23 and 12/27   Antimicrobials: Anti-infectives (From admission, onward)   Start     Dose/Rate Route Frequency Ordered Stop   04/29/17 1200  cefTRIAXone (ROCEPHIN) 2 g in dextrose 5 % 50 mL IVPB     2 g 100 mL/hr over  30 Minutes Intravenous Every 24 hours 04/29/17 1100     04/25/17 1200  vancomycin (VANCOCIN) IVPB 750 mg/150 ml premix  Status:  Discontinued     750 mg 150 mL/hr over 60 Minutes Intravenous Every 12 hours 04/25/17 0835 04/27/17 1406   04/25/17 1000  ceFEPIme (MAXIPIME) 1 g in dextrose 5 % 50 mL IVPB  Status:   Discontinued     1 g 100 mL/hr over 30 Minutes Intravenous Every 8 hours 04/25/17 0835 04/29/17 1100   04/25/17 0015  ceFEPIme (MAXIPIME) 2 g in dextrose 5 % 50 mL IVPB     2 g 100 mL/hr over 30 Minutes Intravenous  Once 04/25/17 0001 04/25/17 0119   04/25/17 0015  vancomycin (VANCOCIN) IVPB 1000 mg/200 mL premix     1,000 mg 200 mL/hr over 60 Minutes Intravenous  Once 04/25/17 0001 04/25/17 0259   04/25/17 0015  azithromycin (ZITHROMAX) 500 mg in dextrose 5 % 250 mL IVPB  Status:  Discontinued     500 mg 250 mL/hr over 60 Minutes Intravenous Daily at bedtime 04/25/17 0009 04/29/17 1100          Objective: Vitals:   05/06/17 0400 05/06/17 0600 05/06/17 0730 05/06/17 0851  BP: (!) 121/54 136/62  (!) 118/56  Pulse: 94 96  99  Resp: (!) 27 (!) 27    Temp:   (!) 97.4 F (36.3 C)   TempSrc:   Axillary   SpO2: 97% 100%    Weight:      Height:        Intake/Output Summary (Last 24 hours) at 05/06/2017 0916 Last data filed at 05/06/2017 0906 Gross per 24 hour  Intake 1390 ml  Output 1400 ml  Net -10 ml   Filed Weights   05/03/17 0600 05/04/17 0413 05/05/17 0441  Weight: 78.6 kg (173 lb 4.5 oz) 81.7 kg (180 lb 1.9 oz) 82.1 kg (180 lb 16 oz)    Examination:  General exam: Mild resp distress, on O2 7 L [place], ill looking  Respiratory system: Breath sounds diminished b/l, diffuse rhonchi, no wheezing  Cardiovascular system: S1 & S2 heard, RRR. No JVD, murmurs, rubs or gallops Gastrointestinal system: Abdomen is nondistended, soft and nontender. Central nervous system: Alert and oriented. No focal neurological deficits. Extremities: edema in all 4 extremities   Skin: No rashes Psychiatry:  Mood & affect depressed    Data Reviewed: I have personally reviewed following labs and imaging studies  CBC: Recent Labs  Lab 05/02/17 0338 05/03/17 0241 05/04/17 0532 05/05/17 0333 05/06/17 0316  WBC 12.5* 11.8* 13.1* 16.5* 16.2*  NEUTROABS 11.2 10.4* 11.6* 14.7* 14.3  HGB 11.7*  11.0* 11.6* 11.8* 11.7*  HCT 36.9* 34.6* 36.9* 38.2* 37.0*  MCV 94.1 94.0 94.9 96.2 94.6  PLT 262 255 225 235 782   Basic Metabolic Panel: Recent Labs  Lab 05/01/17 0317 05/02/17 0338 05/03/17 0241 05/04/17 0532 05/05/17 0333 05/06/17 0316  NA 142  --  136 135 136 137  K 4.1  --  4.0 4.1 4.5 4.1  CL 111  --  103 102 103 102  CO2 27  --  26 29 26 28   GLUCOSE 88  --  116* 153* 131* 178*  BUN 32*  --  17 16 14 19   CREATININE 0.66 0.41* 0.51* 0.43* 0.40* 0.38*  CALCIUM 8.0*  --  7.8* 7.9* 7.9* 7.8*  MG  --   --   --   --  1.8 1.8  PHOS  --   --   --   --  2.6 2.6   GFR: Estimated Creatinine Clearance: 82.4 mL/min (A) (by C-G formula based on SCr of 0.38 mg/dL (L)). Liver Function Tests: Recent Labs  Lab 05/05/17 0333 05/06/17 0316  AST  --  16  ALT  --  32  ALKPHOS  --  184*  BILITOT  --  0.6  PROT  --  4.8*  ALBUMIN 1.8* 1.7*   No results for input(s): LIPASE, AMYLASE in the last 168 hours. No results for input(s): AMMONIA in the last 168 hours. Coagulation Profile: No results for input(s): INR, PROTIME in the last 168 hours. Cardiac Enzymes: No results for input(s): CKTOTAL, CKMB, CKMBINDEX, TROPONINI in the last 168 hours. BNP (last 3 results) No results for input(s): PROBNP in the last 8760 hours. HbA1C: No results for input(s): HGBA1C in the last 72 hours. CBG: Recent Labs  Lab 05/05/17 0750 05/05/17 1201 05/05/17 1529 05/05/17 2117 05/06/17 0731  GLUCAP 102* 184* 214* 115* 123*   Lipid Profile: No results for input(s): CHOL, HDL, LDLCALC, TRIG, CHOLHDL, LDLDIRECT in the last 72 hours. Thyroid Function Tests: No results for input(s): TSH, T4TOTAL, FREET4, T3FREE, THYROIDAB in the last 72 hours. Anemia Panel: No results for input(s): VITAMINB12, FOLATE, FERRITIN, TIBC, IRON, RETICCTPCT in the last 72 hours. Sepsis Labs: No results for input(s): PROCALCITON, LATICACIDVEN in the last 168 hours.  Recent Results (from the past 240 hour(s))  Body fluid  culture     Status: None   Collection Time: 04/26/17 12:24 PM  Result Value Ref Range Status   Specimen Description PLEURAL LEFT  Final   Special Requests NONE  Final   Gram Stain   Final    ABUNDANT WBC PRESENT, PREDOMINANTLY PMN MODERATE GRAM POSITIVE COCCI IN PAIRS IN CHAINS Performed at [facility], [address] [place], [place] [address]    Culture RARE VIRIDANS STREPTOCOCCUS  Final   Report Status 05/02/2017 FINAL  Final   Organism ID, Bacteria VIRIDANS STREPTOCOCCUS  Final      Susceptibility   Viridans streptococcus - MIC*    ERYTHROMYCIN <=0.12 SENSITIVE Sensitive     LEVOFLOXACIN 0.5 SENSITIVE Sensitive     VANCOMYCIN 0.5 SENSITIVE Sensitive     * RARE VIRIDANS STREPTOCOCCUS  Fungus Culture With Stain     Status: None (Preliminary result)   Collection Time: 04/26/17 12:24 PM  Result Value Ref Range Status   Fungus Stain Final report  Final    Comment: (NOTE) Performed At: [facility] [place], [place] [id number] Rush Farmer MD [id number]    Fungus (Mycology) Culture PENDING  Incomplete   Fungal Source PLEURAL  Final    Comment: LEFT  Fungus Culture Result     Status: None   Collection Time: 04/26/17 12:24 PM  Result Value Ref Range Status   Result 1 Comment  Final    Comment: (NOTE) KOH/Calcofluor preparation:  no fungus observed. Performed At: [facility] [place], [place] [id number] Rush Farmer MD [id number]   Culture, body fluid-bottle     Status: None   Collection Time: 04/30/17 11:14 AM  Result Value Ref Range Status   Specimen Description FLUID PLEURAL LEFT  Final   Special Requests NONE  Final   Culture   Final    NO GROWTH 5 DAYS Performed at [facility], [address], [place], [place] [address]    Report Status 05/05/2017 FINAL  Final  Gram stain     Status: None   Collection  Time: 04/30/17 11:14 AM  Result Value Ref Range Status   Specimen Description FLUID PLEURAL  LEFT  Final   Special Requests NONE  Final   Gram Stain   Final    ABUNDANT WBC PRESENT, PREDOMINANTLY PMN NO ORGANISMS SEEN Performed at [facility], [address], [place], [place] [address]    Report Status 04/30/2017 FINAL  Final      Radiology Studies: Dg Chest Port 1 View  Result Date: 05/04/2017 CLINICAL DATA:  Patient with history of loculated pleural effusion. EXAM: PORTABLE CHEST 1 VIEW COMPARISON:  Chest radiograph 05/01/2017. FINDINGS: Monitoring leads overlie the patient. Stable cardiomegaly. Re- demonstrated loculated left hydropneumothorax. Residual left lung is largely obscured. Right basilar opacities favored to represent atelectasis. Thoracic spine degenerative changes. IMPRESSION: Re- demonstrated large left hydropneumothorax. Residual left parenchymal tissue is largely obscured. Electronically Signed   By: Lovey Newcomer M.D.   On: 05/04/2017 11:44      Scheduled Meds: . amiodarone  400 mg Oral BID  . budesonide (PULMICORT) nebulizer solution  0.25 mg Nebulization BID  . chlorhexidine  15 mL Mouth Rinse BID  . enoxaparin (LOVENOX) injection  40 mg Subcutaneous QHS  . feeding supplement  1 Container Oral BID BM  . Gerhardt's butt cream   Topical TID  . insulin aspart  0-9 Units Subcutaneous TID WC  . ipratropium  0.5 mg Nebulization Q6H  . levalbuterol  0.63 mg Nebulization Q6H  . mouth rinse  15 mL Mouth Rinse q12n4p  . metoprolol tartrate  25 mg Oral BID   Continuous Infusions: . sodium chloride 50 mL/hr at 05/06/17 0502  . cefTRIAXone (ROCEPHIN)  IV Stopped (05/05/17 1448)     LOS: 11 days    Time spent: Total of  35 minutes spent with pt, greater than 50% of which was spent in discussion of  treatment, counseling and coordination of care    Chipper Oman, MD Pager: Text Page via [email or web address]   If 7PM-7AM, please contact night-coverage [email or web address] 05/06/2017, 9:16 AM

## 2017-05-07 ENCOUNTER — Ambulatory Visit: Payer: Medicare Other

## 2017-05-07 ENCOUNTER — Ambulatory Visit
Admission: RE | Admit: 2017-05-07 | Discharge: 2017-05-07 | Disposition: A | Discharge status: Home / Self Care | Payer: Medicare Other | Source: Ambulatory Visit | Attending: Radiation Oncology | Admitting: Radiation Oncology

## 2017-05-07 DIAGNOSIS — J948 Other specified pleural conditions: Secondary | ICD-10-CM

## 2017-05-07 LAB — CBC WITH DIFFERENTIAL/PLATELET
BASOS ABS: 0 10*3/uL (ref 0.0–0.1)
Basophils Relative: 0 %
EOS ABS: 0 10*3/uL (ref 0.0–0.7)
Eosinophils Relative: 0 %
HCT: 35.9 % — ABNORMAL LOW (ref 39.0–52.0)
HEMOGLOBIN: 11 g/dL — AB (ref 13.0–17.0)
LYMPHS PCT: 7 %
Lymphs Abs: 0.8 10*3/uL (ref 0.7–4.0)
MCH: 28.9 pg (ref 26.0–34.0)
MCHC: 30.6 g/dL (ref 30.0–36.0)
MCV: 94.5 fL (ref 78.0–100.0)
MONO ABS: 0.4 10*3/uL (ref 0.1–1.0)
Monocytes Relative: 3 %
NEUTROS ABS: 10.7 10*3/uL — AB (ref 1.7–7.7)
Neutrophils Relative %: 90 %
PLATELETS: 202 10*3/uL (ref 150–400)
RBC: 3.8 MIL/uL — ABNORMAL LOW (ref 4.22–5.81)
RDW: 14 % (ref 11.5–15.5)
WBC: 11.9 10*3/uL — ABNORMAL HIGH (ref 4.0–10.5)

## 2017-05-07 LAB — GLUCOSE, CAPILLARY
GLUCOSE-CAPILLARY: 128 mg/dL — AB (ref 65–99)
GLUCOSE-CAPILLARY: 129 mg/dL — AB (ref 65–99)
GLUCOSE-CAPILLARY: 153 mg/dL — AB (ref 65–99)
Glucose-Capillary: 147 mg/dL — ABNORMAL HIGH (ref 65–99)
Glucose-Capillary: 126 mg/dL — ABNORMAL HIGH (ref 65–99)

## 2017-05-07 LAB — TRIGLYCERIDES, BODY FLUIDS: TRIGLYCERIDES FL: 20 mg/dL

## 2017-05-07 LAB — MAGNESIUM: MAGNESIUM: 1.8 mg/dL (ref 1.7–2.4)

## 2017-05-07 LAB — PHOSPHORUS: PHOSPHORUS: 3 mg/dL (ref 2.5–4.6)

## 2017-05-07 MED ORDER — LORAZEPAM 0.5 MG PO TABS
0.5000 mg | ORAL_TABLET | Freq: Four times a day (QID) | ORAL | Status: DC | PRN
  Administered 2017-05-07 – 2017-05-17 (×9): 0.5 mg via ORAL
  Filled 2017-05-07 (×9): qty 1

## 2017-05-07 MED ORDER — ALBUMIN HUMAN 5 % IV SOLN
12.5000 g | Freq: Once | INTRAVENOUS | Status: AC
  Administered 2017-05-07: 12.5 g via INTRAVENOUS
  Filled 2017-05-07: qty 250

## 2017-05-07 MED ORDER — ENOXAPARIN SODIUM 40 MG/0.4ML ~~LOC~~ SOLN
40.0000 mg | Freq: Every day | SUBCUTANEOUS | Status: DC
  Administered 2017-05-08: 40 mg via SUBCUTANEOUS
  Filled 2017-05-07 (×2): qty 0.4

## 2017-05-07 NOTE — Progress Notes (Signed)
Referring Physician(s): Ramaswamy,M  Supervising Physician: Pilar Jarvis  Patient Status:  [facility] - In-pt  Chief Complaint:  Left chest empyema  Subjective: Patient familiar to IR service from multiple left thoracenteses, latest on 05/06/17 yielding 500 cc.  Repeat analysis of pleural fluid from yesterday reveals glucose less than 20 and high LDH with numerous polys.  Findings are consistent with acute bacterial process.  Cytology was nondiagnostic.  Patient does have history of stage IV lung cancer with persistent hydropneumothorax on the left.  Request now received from CCM for left chest/pigtail drain placement.  He currently denies fever, headache, chest pain, worsening dyspnea, nausea, vomiting or bleeding.  He does have some pelvic pressure secondary to full bladder. Past Medical History:  Diagnosis Date  . Diabetes mellitus without complication ([place])   . Malignant neoplasm of unspecified part of unspecified bronchus or lung ([place]) 03/30/2017   Past Surgical History:  Procedure Laterality Date  . IR THORACENTESIS ASP PLEURAL SPACE W/IMG GUIDE  03/16/2017  . VIDEO BRONCHOSCOPY Bilateral 03/25/2017   Procedure: VIDEO BRONCHOSCOPY WITH FLUORO;  Surgeon: Juanito Doom, MD;  Location: [facility];  Service: Cardiopulmonary;  Laterality: Bilateral;      Allergies: Patient has no known allergies.  Medications: Prior to Admission medications   Medication Sig Start Date End Date Taking? Authorizing Provider  acetaminophen (TYLENOL) 500 MG tablet Take 1,000 mg by mouth every 6 (six) hours as needed for moderate pain.   Yes [provider]  albuterol (PROVENTIL HFA;VENTOLIN HFA) 108 (90 Base) MCG/ACT inhaler Inhale 1 puff every 6 (six) hours as needed into the lungs for wheezing or shortness of breath.   Yes [provider]  loperamide (IMODIUM A-D) 2 MG tablet Take 2 mg by mouth 3 (three) times daily as needed for diarrhea or loose stools.   Yes [provider]  metFORMIN (GLUCOPHAGE) 500 MG tablet Take 500 mg 2 (two) times daily with a meal by mouth.    Yes [provider]  methylPREDNISolone (MEDROL DOSEPAK) 4 MG TBPK tablet Take per package instructions Patient taking differently: as directed. Take 6 tablets on Day 1, Take 5 tablets on Day 2, Take 4 tablets on Day 3, Take 3 tablets on Day 4, Take 2 tablets on Day 5 and take 1 tablet on Day 6. 04/24/17  Yes Curt Bears, MD  prochlorperazine (COMPAZINE) 10 MG tablet Take 1 tablet (10 mg total) by mouth every 6 (six) hours as needed for nausea or vomiting. 04/24/17  Yes Kyung Rudd, MD  ACCU-CHEK FASTCLIX LANCETS [place]  03/19/17   [provider]  ACCU-CHEK GUIDE test strip USE TO CHECK BLOOD SUGAR BID 03/23/17   [provider]  cetirizine (ZYRTEC) 10 MG tablet Take 10 mg daily by mouth. ALLERTEC    [provider]  nystatin (MYCOSTATIN) 100000 UNIT/ML suspension SWISH AND SWALLOW 5 MLS PO  AC AND QHS FOR 7 DAYS 03/31/17   [provider]     Vital Signs: BP 118/65 (BP Location: Right Arm)   Pulse 88   Temp 97.7 F (36.5 C) (Oral)   Resp 20   Ht 5\' 11"  (1.803 m)   Wt 177 lb 7.5 oz (80.5 kg)   SpO2 93%   BMI 24.75 kg/m   Physical Exam awake, answering questions appropriately.  Chest with diminished breath sounds on left and on right in lower lobe; heart with regular rate and rhythm.  Abdomen soft, positive bowel sounds, nontender.  Lower extremity edema noted  Imaging: Dg Chest 1 View  Result Date: 05/06/2017 CLINICAL DATA:  Status post left thoracentesis EXAM: CHEST 1 VIEW COMPARISON:  05/04/2017 FINDINGS: Stable hydropneumothorax is noted on the left. Considerable consolidated lung is again identified and stable from previous exams. The right lung is clear. Cardiac shadow is stable. No bony abnormality is seen. IMPRESSION: Stable left hydropneumothorax. Electronically Signed   By: Inez Catalina M.D.   On: 05/06/2017 15:25   Ct  Chest Wo Contrast  Result Date: 05/06/2017 CLINICAL DATA:  LEFT empyema, stage IV LEFT lung cancer diabetes mellitus EXAM: CT CHEST WITHOUT CONTRAST TECHNIQUE: Multidetector CT imaging of the chest was performed following the standard protocol without IV contrast. Sagittal and coronal MPR images reconstructed from axial data set. COMPARISON:  04/30/2017 ; chest radiographs 05/06/2017, 05/04/2017 and earlier FINDINGS: Cardiovascular: Atherosclerotic calcifications aorta and proximal great vessels. Small pericardial effusion. Aorta normal caliber. Mediastinum/Nodes: Base of cervical region unremarkable. Proximal thoracic esophagus normal appearance with esophagus contiguous with tumor from LEFT lung with which invades the mediastinum. No esophageal dilatation. No definite thoracic adenopathy, though LEFT hilar assessment is severely limited. Lungs/Pleura: Complete collapse of the LEFT lung since prior exam with again identified large LEFT hydropneumothorax. Associated depression of the LEFT diaphragm. Abnormal soft tissue at the medial RIGHT lung extending from upper lobe into mediastinum, abutting the esophagus, LEFT pulmonary artery, and descending thoracic aorta. Abrupt cut off of the LEFT mainstem bronchus by tumor. Small to moderate RIGHT pleural effusion with compressive atelectasis of the RIGHT lower lobe. Underlying emphysematous changes. Scattered respiratory motion artifacts. Central peribronchial thickening. No definite pulmonary mass/nodule or acute infiltrate. Upper Abdomen: Dependent gallstones in gallbladder. Mild collecting system dilatation at upper pole RIGHT kidney. Atrophic pancreas. Remaining visualized upper abdomen unremarkable. Musculoskeletal: No acute osseous findings IMPRESSION: Large LEFT hydropneumothorax with depression of LEFT diaphragm as noted on prior chest radiographs. Probable large tumor mass at the central LEFT lung invading the mediastinum and abutting the esophagus, descending  thoracic aorta, and LEFT pulmonary artery, with associated with abrupt cut off of the LEFT mainstem bronchus and complete collapse of the LEFT lung progressive since prior exam. Small pericardial effusion. Increased RIGHT pleural effusion and compressive atelectasis of the posterior RIGHT lung since prior exam. Question RIGHT hydronephrosis. Cholelithiasis. Aortic Atherosclerosis (ICD10-I70.0). Emphysema (ICD10-J43.9). Electronically Signed   By: Lavonia Dana M.D.   On: 05/06/2017 22:01   Dg Chest Port 1 View  Result Date: 05/04/2017 CLINICAL DATA:  Patient with history of loculated pleural effusion. EXAM: PORTABLE CHEST 1 VIEW COMPARISON:  Chest radiograph 05/01/2017. FINDINGS: Monitoring leads overlie the patient. Stable cardiomegaly. Re- demonstrated loculated left hydropneumothorax. Residual left lung is largely obscured. Right basilar opacities favored to represent atelectasis. Thoracic spine degenerative changes. IMPRESSION: Re- demonstrated large left hydropneumothorax. Residual left parenchymal tissue is largely obscured. Electronically Signed   By: Lovey Newcomer M.D.   On: 05/04/2017 11:44   US Thoracentesis Asp Pleural Space W/img Guide  Result Date: 05/06/2017 INDICATION: Patient with stage IV lung cancer with hydropneumothorax status post thoracentesis x2. Persistent hypoxia and recurrence of pleural fluid in free space. Request is made for diagnostic therapeutic thoracentesis. EXAM: ULTRASOUND GUIDED DIAGNOSTIC AND THERAPEUTIC THORACENTESIS MEDICATIONS: 1% lidocaine COMPLICATIONS: None immediate. PROCEDURE: An ultrasound guided thoracentesis was thoroughly discussed with the patient and questions answered. The benefits, risks, alternatives and complications were also discussed. The patient understands and wishes to proceed with the procedure. Written consent was obtained. Ultrasound was performed to localize and mark an adequate pocket of fluid  in the left chest. The area was then prepped and  draped in the normal sterile fashion. 1% Lidocaine was used for local anesthesia. Under ultrasound guidance a Safe-T-Centesis catheter was introduced. Thoracentesis was performed. The catheter was removed and a dressing applied. FINDINGS: A total of approximately 0.5 L of turbid yellow, brown fluid was removed. Samples were sent to the laboratory as requested by the clinical team. IMPRESSION: Successful ultrasound guided left thoracentesis yielding 0.5 L of pleural fluid. The fluid was more complex appearing. After reviewing his imaging with Dr. Annamaria Boots, it was felt that only remove about 500 cc of fluid as if he were to need a drained we still would like fluid to be present. Samples were sent to the lab determine whether he will eventually need a drain or not. Read by: Saverio Danker, PA-C Electronically Signed   By: Jerilynn Mages.  Shick M.D.   On: 05/06/2017 16:13    Labs:  CBC: Recent Labs    05/04/17 0532 05/05/17 0333 05/06/17 0316 05/07/17 0411  WBC 13.1* 16.5* 16.2* 11.9*  HGB 11.6* 11.8* 11.7* 11.0*  HCT 36.9* 38.2* 37.0* 35.9*  PLT 225 235 244 202    COAGS: No results for input(s): INR, APTT in the last 8760 hours.  BMP: Recent Labs    05/03/17 0241 05/04/17 0532 05/05/17 0333 05/06/17 0316  NA 136 135 136 137  K 4.0 4.1 4.5 4.1  CL 103 102 103 102  CO2 26 29 26 28   GLUCOSE 116* 153* 131* 178*  BUN 17 16 14 19   CALCIUM 7.8* 7.9* 7.9* 7.8*  CREATININE 0.51* 0.43* 0.40* 0.38*  GFRNONAA >60 >60 >60 >60  GFRAA >60 >60 >60 >60    LIVER FUNCTION TESTS: Recent Labs    04/22/17 1428 04/24/17 2054 04/26/17 0311 05/05/17 0333 05/06/17 0316  BILITOT 0.58 0.9 0.7  --  0.6  AST 18 26 25   --  16  ALT 22 22 22   --  32  ALKPHOS 110 132* 79  --  184*  PROT 6.6 7.4 5.7*  --  4.8*  ALBUMIN 3.3* 3.6 2.5* 1.8* 1.7*    Assessment and Plan: Patient with history of stage IV non-small cell lung cancer, recurrent left pleural effusion with known hydropneumothorax, recent lab findings  indicative of empyema; request now received for left chest pigtail drain placement.  Imaging studies were reviewed by Dr. Kathlene Cote.  Details/risks of procedure, including but not limited to, internal bleeding, infection, injury to adjacent structures, need for prolonged drainage discussed with the patient with his understanding and consent.  Patient also aware that placement of chest drain may not cause reexpansion of lung.  Procedure tentatively planned  for tomorrow.   Electronically Signed: D. Rowe Robert, PA-C 05/07/2017, 1:46 PM   I spent a total of 25 minutes at the the patient's bedside AND on the patient's hospital floor or unit, greater than 50% of which was counseling/coordinating care for left chest drain placement    Patient ID: Maurice Tyler, male   DOB: 01/01/1941, 77 y.o.   MRN: [id number]

## 2017-05-07 NOTE — Assessment & Plan Note (Signed)
Repeated thora clearly shows EMPYEMA with gluc < 20 and high LDH and high polys. These are all c/w acute bacterial process. Culture results are irrelevant. While this could still be malignancy (with cyto being non diag), the presence of acute inflammation with urine strep atngigen positive all c/w acute bacterial empyema and he is accumulating lot of this.  This process itself can leave patients deconditioned and failure to thrive if not drained. I belive in this patient this +/- cancer contributing to [facility] to thrive  Plan - given his desire for full code + potentially reversible/Treatable process of empyema (bacterial) causing FTT -> I recommend Pleural Drain  - we can address fibrinolytics later - if he fails this over next several days / few weeks, we can address goals of care and comfort status - agree though that the trapped lung might make it dificult to ultimately for him to get off the tube but given empyema I think we have no choice but to drain (surgery risky)   Explained to patient

## 2017-05-07 NOTE — Progress Notes (Signed)
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 Patient being currently managed for multiofocal pneumonia and left-sided pleural effusion.  Subjective: Patient seen and examined, report that breathing has improve some after thoracentesis. Patient requesting to to have chemotherapy tomorrow as he feels very weak. Remains afebrile otherwise feeling ok.   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)   Diabetes mellitus type 2 in nonobese (HCC)   Acute respiratory failure with hypoxia and hypercapnia (HCC)   Severe sepsis (HCC)   Malnutrition of moderate degree   Pressure injury of skin   Empyema of pleura (HCC)   New onset a-fib (HCC)   AKI (acute kidney injury) ([place])   Non-small cell cancer of left lung (HCC)   Goals of care, counseling/discussion   Palliative care encounter   Streptococcal pneumonia ([place])   Malignant neoplasm of left lung ([place])  Acute respiratory failure with hypoxia and hypercarbia / Sepsis secondary to streptococcal multifocal pneumonia / Empyema w +/- malignant effusion  - CXR on admission showed left perihilar soft tissue mass consistent with known malignancy. - Pt is status post thoracentesis 12/23 - 1.4 L fluid removed; thoracentesis 12/27 - 2.3 L fluid, 05/06/17 0.5L removed fluid analysis c/w bacteria empyema - Patient need drainage - IR planning to place pig tail tentatively on 05/08/16 - Blood cx showed no growth - Pleural fluid cx grew strep viridans  - Continue rocephin for streptococcal  pneumoniae per ID recommendations for minimum of 2 weeks   Leukocytosis: due to above, trending up, ctm   Edema - Improved  - Hypoproteinemia, 3rd spacing. - Will give another dose of albumin  - Continue lasix IV 20 mg   Metastatic stage IV lung cancer - Follows with Dr. [name] - On radio and chemotherapy  - Will hold next chemo   Afib with RVR - stable  - CHA2DS2-VASc Score 2 -Continue amiodarone -Continue metoprolol - cards saw, recommended against anticoagulation given new afib and significant commorbidities (see 12/27 cards c/s note)  AKI - Resolved  -Secondary to sepsis - Continue to monitor   Diabetes type 2 without complications- Stable  -SSI  - Monitor CBG's   Moderate protein calorie malnutrition -In the setting of chronic illness -Per nutritionist recommendations   Skin pressure injury, stage 1(left armpit) / Stage 2 sacral ulcer - Wound care consulted - Care per RN  Urinary retention  - Unclear etiology, ? Hypoperfusion from 3rd spacing, acute illness  - Will place foley, check PSA and renal US   Goal of care I have a long conversation with patient, he tells me that he wants to fight as much as he can. He does not want to die. We talk about expectations and patient understand how delicate is his condition. Patient wants to remain full code, and he will be making his own decisions. Patient is fully competent. Will continue to treat with aggressive measures. Palliative care d/ced   DVT prophylaxis: Lovenox  Code Status: Full  Family Communication: None at bedside  Disposition Plan: TBD   Consultants:   IR  PCCM  ID   Palliative   Procedures:   Thoracentesis 12/23 and 12/27   Antimicrobials: Anti-infectives (From admission, onward)   Start     Dose/Rate Route Frequency Ordered Stop   04/29/17 1200  cefTRIAXone (ROCEPHIN) 2 g in dextrose 5 % 50 mL IVPB     2 g 100 mL/hr over 30 Minutes Intravenous Every 24 hours 04/29/17  1100     04/25/17 1200  vancomycin (VANCOCIN) IVPB 750 mg/150 ml premix  Status:  Discontinued     750 mg 150 mL/hr over 60 Minutes Intravenous Every 12 hours 04/25/17 0835 04/27/17 1406   04/25/17 1000  ceFEPIme (MAXIPIME) 1 g in dextrose 5 % 50 mL IVPB  Status:  Discontinued     1 g 100 mL/hr over 30 Minutes Intravenous Every 8 hours 04/25/17 0835 04/29/17 1100   04/25/17 0015  ceFEPIme (MAXIPIME) 2 g in dextrose 5 % 50 mL IVPB     2 g 100 mL/hr over 30 Minutes Intravenous  Once 04/25/17 0001 04/25/17 0119   04/25/17 0015  vancomycin (VANCOCIN) IVPB 1000 mg/200 mL premix     1,000 mg 200 mL/hr over 60 Minutes Intravenous  Once 04/25/17 0001 04/25/17 0259   04/25/17 0015  azithromycin (ZITHROMAX) 500 mg in dextrose 5 % 250 mL IVPB  Status:  Discontinued     500 mg 250 mL/hr over 60 Minutes Intravenous Daily at bedtime 04/25/17 0009 04/29/17 1100         Objective: Vitals:   05/07/17 0835 05/07/17 0854 05/07/17 0903 05/07/17 1221  BP: (!) 112/51   118/65  Pulse:    88  Resp:    20  Temp:    97.7 F (36.5 C)  TempSrc:    Oral  SpO2:  98% 98% 93%  Weight:      Height:        Intake/Output Summary (Last 24 hours) at 05/07/2017 1515 Last data filed at 05/07/2017 1459 Gross per 24 hour  Intake 1407.5 ml  Output 3650 ml  Net -2242.5 ml   Filed Weights   05/04/17 0413 05/05/17 0441 05/07/17 0500  Weight: 81.7 kg (180 lb 1.9 oz) 82.1 kg (180 lb 16 oz) 80.5 kg (177 lb 7.5 oz)    Examination:  General: Ill looking  Cardiovascular: RRR, I9/C7 + systolic murmur  Respiratory: Air entry diminished, b/l rales  Abdominal: Soft, NT, ND, bowel sounds + Extremities: edema in all 4 extremities, improved   Data Reviewed: I have personally reviewed following labs and imaging studies  CBC: Recent Labs  Lab 05/03/17 0241 05/04/17 0532 05/05/17 0333 05/06/17 0316 05/07/17 0411  WBC 11.8* 13.1* 16.5* 16.2* 11.9*  NEUTROABS 10.4* 11.6* 14.7* 14.3 10.7*  HGB 11.0* 11.6* 11.8*  11.7* 11.0*  HCT 34.6* 36.9* 38.2* 37.0* 35.9*  MCV 94.0 94.9 96.2 94.6 94.5  PLT 255 225 235 244 893   Basic Metabolic Panel: Recent Labs  Lab 05/01/17 0317 05/02/17 0338 05/03/17 0241 05/04/17 0532 05/05/17 0333 05/06/17 0316 05/07/17 0411  NA 142  --  136 135 136 137  --   K 4.1  --  4.0 4.1 4.5 4.1  --   CL 111  --  103 102 103 102  --   CO2 27  --  26 29 26 28   --   GLUCOSE 88  --  116* 153* 131* 178*  --   BUN 32*  --  17 16 14 19   --   CREATININE 0.66 0.41* 0.51* 0.43*  0.40* 0.38*  --   CALCIUM 8.0*  --  7.8* 7.9* 7.9* 7.8*  --   MG  --   --   --   --  1.8 1.8 1.8  PHOS  --   --   --   --  2.6 2.6 3.0   GFR: Estimated Creatinine Clearance: 82.4 mL/min (A) (by C-G formula based on SCr of 0.38 mg/dL (L)). Liver Function Tests: Recent Labs  Lab 05/05/17 0333 05/06/17 0316  AST  --  16  ALT  --  32  ALKPHOS  --  184*  BILITOT  --  0.6  PROT  --  4.8*  ALBUMIN 1.8* 1.7*   No results for input(s): LIPASE, AMYLASE in the last 168 hours. No results for input(s): AMMONIA in the last 168 hours. Coagulation Profile: No results for input(s): INR, PROTIME in the last 168 hours. Cardiac Enzymes: No results for input(s): CKTOTAL, CKMB, CKMBINDEX, TROPONINI in the last 168 hours. BNP (last 3 results) No results for input(s): PROBNP in the last 8760 hours. HbA1C: No results for input(s): HGBA1C in the last 72 hours. CBG: Recent Labs  Lab 05/06/17 1138 05/06/17 1548 05/07/17 0304 05/07/17 0735 05/07/17 1155  GLUCAP 162* 173* 147* 126* 128*   Lipid Profile: No results for input(s): CHOL, HDL, LDLCALC, TRIG, CHOLHDL, LDLDIRECT in the last 72 hours. Thyroid Function Tests: No results for input(s): TSH, T4TOTAL, FREET4, T3FREE, THYROIDAB in the last 72 hours. Anemia Panel: No results for input(s): VITAMINB12, FOLATE, FERRITIN, TIBC, IRON, RETICCTPCT in the last 72 hours. Sepsis Labs: No results for input(s): PROCALCITON, LATICACIDVEN in the last 168  hours.  Recent Results (from the past 240 hour(s))  Culture, body fluid-bottle     Status: None   Collection Time: 04/30/17 11:14 AM  Result Value Ref Range Status   Specimen Description FLUID PLEURAL LEFT  Final   Special Requests NONE  Final   Culture   Final    NO GROWTH 5 DAYS Performed at [facility], [address], [place], [place] [address]    Report Status 05/05/2017 FINAL  Final  Gram stain     Status: None   Collection Time: 04/30/17 11:14 AM  Result Value Ref Range Status   Specimen Description FLUID PLEURAL LEFT  Final   Special Requests NONE  Final   Gram Stain   Final    ABUNDANT WBC PRESENT, PREDOMINANTLY PMN NO ORGANISMS SEEN Performed at [facility], [place] [address] [place], [place] [address]    Report Status 04/30/2017 FINAL  Final  Culture, body fluid-bottle     Status: None (Preliminary result)   Collection Time: 05/06/17  2:58 PM  Result Value Ref Range Status   Specimen Description PLEURAL LEFT  Final   Special Requests NONE  Final   Culture   Final    NO GROWTH < 24 HOURS Performed at [facility], [place] [address], [place], [place] [address]    Report Status PENDING  Incomplete  Gram stain     Status: None   Collection Time: 05/06/17  2:58 PM  Result Value Ref Range Status   Specimen Description PLEURAL LEFT  Final   Special Requests NONE  Final   Gram Stain   Final    MODERATE WBC PRESENT, PREDOMINANTLY PMN NO ORGANISMS SEEN Performed at [facility], [place] [address], [place], [place] [address]    Report Status 05/06/2017 FINAL  Final  Radiology Studies: Dg Chest 1 View  Result Date: 05/06/2017 CLINICAL DATA:  Status post left thoracentesis EXAM: CHEST 1 VIEW COMPARISON:  05/04/2017 FINDINGS: Stable hydropneumothorax is noted on the left. Considerable consolidated lung is again identified and stable from previous exams. The right lung is clear. Cardiac shadow is stable. No bony abnormality is seen. IMPRESSION:  Stable left hydropneumothorax. Electronically Signed   By: Inez Catalina M.D.   On: 05/06/2017 15:25   Ct Chest Wo Contrast  Result Date: 05/06/2017 CLINICAL DATA:  LEFT empyema, stage IV LEFT lung cancer diabetes mellitus EXAM: CT CHEST WITHOUT CONTRAST TECHNIQUE: Multidetector CT imaging of the chest was performed following the standard protocol without IV contrast. Sagittal and coronal MPR images reconstructed from axial data set. COMPARISON:  04/30/2017 ; chest radiographs 05/06/2017, 05/04/2017 and earlier FINDINGS: Cardiovascular: Atherosclerotic calcifications aorta and proximal great vessels. Small pericardial effusion. Aorta normal caliber. Mediastinum/Nodes: Base of cervical region unremarkable. Proximal thoracic esophagus normal appearance with esophagus contiguous with tumor from LEFT lung with which invades the mediastinum. No esophageal dilatation. No definite thoracic adenopathy, though LEFT hilar assessment is severely limited. Lungs/Pleura: Complete collapse of the LEFT lung since prior exam with again identified large LEFT hydropneumothorax. Associated depression of the LEFT diaphragm. Abnormal soft tissue at the medial RIGHT lung extending from upper lobe into mediastinum, abutting the esophagus, LEFT pulmonary artery, and descending thoracic aorta. Abrupt cut off of the LEFT mainstem bronchus by tumor. Small to moderate RIGHT pleural effusion with compressive atelectasis of the RIGHT lower lobe. Underlying emphysematous changes. Scattered respiratory motion artifacts. Central peribronchial thickening. No definite pulmonary mass/nodule or acute infiltrate. Upper Abdomen: Dependent gallstones in gallbladder. Mild collecting system dilatation at upper pole RIGHT kidney. Atrophic pancreas. Remaining visualized upper abdomen unremarkable. Musculoskeletal: No acute osseous findings IMPRESSION: Large LEFT hydropneumothorax with depression of LEFT diaphragm as noted on prior chest radiographs. Probable  large tumor mass at the central LEFT lung invading the mediastinum and abutting the esophagus, descending thoracic aorta, and LEFT pulmonary artery, with associated with abrupt cut off of the LEFT mainstem bronchus and complete collapse of the LEFT lung progressive since prior exam. Small pericardial effusion. Increased RIGHT pleural effusion and compressive atelectasis of the posterior RIGHT lung since prior exam. Question RIGHT hydronephrosis. Cholelithiasis. Aortic Atherosclerosis (ICD10-I70.0). Emphysema (ICD10-J43.9). Electronically Signed   By: Lavonia Dana M.D.   On: 05/06/2017 22:01   US Thoracentesis Asp Pleural Space W/img Guide  Result Date: 05/06/2017 INDICATION: Patient with stage IV lung cancer with hydropneumothorax status post thoracentesis x2. Persistent hypoxia and recurrence of pleural fluid in free space. Request is made for diagnostic therapeutic thoracentesis. EXAM: ULTRASOUND GUIDED DIAGNOSTIC AND THERAPEUTIC THORACENTESIS MEDICATIONS: 1% lidocaine COMPLICATIONS: None immediate. PROCEDURE: An ultrasound guided thoracentesis was thoroughly discussed with the patient and questions answered. The benefits, risks, alternatives and complications were also discussed. The patient understands and wishes to proceed with the procedure. Written consent was obtained. Ultrasound was performed to localize and mark an adequate pocket of fluid in the left chest. The area was then prepped and draped in the normal sterile fashion. 1% Lidocaine was used for local anesthesia. Under ultrasound guidance a Safe-T-Centesis catheter was introduced. Thoracentesis was performed. The catheter was removed and a dressing applied. FINDINGS: A total of approximately 0.5 L of turbid yellow, brown fluid was removed. Samples were sent to the laboratory as requested by the clinical team. IMPRESSION: Successful ultrasound guided left thoracentesis yielding 0.5 L of pleural fluid. The fluid was more complex appearing. After  reviewing his imaging with Dr. Annamaria Boots, it was felt that only remove about 500 cc of fluid as if he were to need a drained we still would like fluid to be present. Samples were sent to the lab determine whether he will eventually need a drain or not. Read by: Saverio Danker, PA-C Electronically Signed   By: Jerilynn Mages.  Shick M.D.   On: 05/06/2017 16:13      Scheduled Meds: . amiodarone  400 mg Oral BID  . budesonide (PULMICORT) nebulizer solution  0.25 mg Nebulization BID  . chlorhexidine  15 mL Mouth Rinse BID  . [START ON 05/08/2017] enoxaparin (LOVENOX) injection  40 mg Subcutaneous QHS  . feeding supplement  1 Container Oral BID BM  . furosemide  20 mg Intravenous Daily  . Gerhardt's butt cream   Topical TID  . insulin aspart  0-9 Units Subcutaneous TID WC  . ipratropium  0.5 mg Nebulization Q6H  . levalbuterol  0.63 mg Nebulization Q6H  . mouth rinse  15 mL Mouth Rinse q12n4p  . metoprolol tartrate  25 mg Oral BID   Continuous Infusions: . sodium chloride 50 mL/hr at 05/07/17 0721  . cefTRIAXone (ROCEPHIN)  IV 2 g (05/07/17 1231)     LOS: 12 days    Time spent: Total of  35 minutes spent with pt, greater than 50% of which was spent in discussion of  treatment, counseling and coordination of care    Chipper Oman, MD Pager: Text Page via [email or web address]   If 7PM-7AM, please contact night-coverage [email or web address] 05/07/2017, 3:15 PM

## 2017-05-07 NOTE — Progress Notes (Signed)
Patient voiced urge to urinate but able to upon attempt. Bladder scan performed and showed greater than 999. In and out cath done by RN which resulted in 1050 cc of yellow urine. Will continue to monitor patient.

## 2017-05-07 NOTE — Progress Notes (Signed)
Patient converted to a-fib HR sustaining from 115-125.  EKG done to confirm a-fib rhythm.  5mg  IV lopressor given.  MD made aware.  Pt converted back to NSR at 1715, HR in the 90's.

## 2017-05-07 NOTE — Progress Notes (Signed)
Pt unable to void.  PVR showing 650cc's of urine.  Verbal order from Dr. Quincy Simmonds to In and Out cath patient.  650cc's of clear yellow urine returned.

## 2017-05-07 NOTE — Progress Notes (Signed)
Palliative to consult. CM will continue to follow for discharge plans.

## 2017-05-07 NOTE — Progress Notes (Signed)
[facility] Date of Admission: 04/24/2017 Date of Consult: 04/25/2017 Referring Provider: Dr. Hal Hope, [facility] Chief Complaint: Short of breath  HPI: 77 yo male former smoker presented with dyspnea, hypoxia, and productive cough.  He has hx of Stage IV NSCLC (squamous cell) and started chemotherapy Marita [place], carboplatin, paclitaxel) 3 weeks prior to admission.  He was found to have multifocal pneumonia and large Lt pleural effusion.  Cultures: Blood 12/21 >>  Blood 12/22 >>  Pneumococcal Ag 12/22 >> positive Legionella Ag 12/22 >> negative Lt pleural fluid >> GPC in chains  Antibiotics: Vancomycin 12/21 >> 12/24  Cefepime 12/21 >> 12/25 Zithromax 12/21 >> 12/25 Rocephin 12/26 >>  Studies: CT angio chest 12/21 >> Lt mediastinal mass with compression of Lt main bronchus and Lt PA, large Lt effusion, ASD on Lt, patchy ASD on Rt, emphysema Echo 12/22 >> EF 60 to 65%, PAS 62 mmHg Lt thoracentesis 12/23 >> 1.4 liters, glucose < 20, protein < 3, WBC 78,475 (93%N). No cancer cells Lt thora 12/27 - acute inflammation. No cancer cells   Events: 12/22 Admit 05/06/2017 Rtx oncology continues 1/2 IR calliing concerning need for placement of pleural drain?  Lines/tubes:  Consults: 12/26 ID 12/26 Oncology 12/26 Cardiology 12/26 Palliative care   Subjective: 05/07/2017  -> 3rd thora on 05/06/17  continues on 2. Reopeat thora with gluc < 20 and high LDH and acute inflmatiion with 86% poluys / Dw Ronnell [place] of IR 05/07/2017 and she showed images of [place] from yesterday that still shows loculation and remaining fluid. She described fluid as turbid     Recent Labs  Lab 05/04/17 0532 05/05/17 0333 05/06/17 0316  NA 135 136 137  K 4.1 4.5 4.1  CL 102 103 102  CO2 29 26 28   BUN 16 14 19   CREATININE 0.43* 0.40* 0.38*  GLUCOSE 153* 131* 178*   Recent Labs  Lab 05/05/17 0333 05/06/17 0316 05/07/17 0411  HGB 11.8* 11.7* 11.0*  HCT 38.2* 37.0* 35.9*  WBC  16.5* 16.2* 11.9*  PLT 235 244 202   Dg Chest 1 View  Result Date: 05/06/2017 CLINICAL DATA:  Status post left thoracentesis EXAM: CHEST 1 VIEW COMPARISON:  05/04/2017 FINDINGS: Stable hydropneumothorax is noted on the left. Considerable consolidated lung is again identified and stable from previous exams. The right lung is clear. Cardiac shadow is stable. No bony abnormality is seen. IMPRESSION: Stable left hydropneumothorax. Electronically Signed   By: Inez Catalina M.D.   On: 05/06/2017 15:25   Ct Chest Wo Contrast  Result Date: 05/06/2017 CLINICAL DATA:  LEFT empyema, stage IV LEFT lung cancer diabetes mellitus EXAM: CT CHEST WITHOUT CONTRAST TECHNIQUE: Multidetector CT imaging of the chest was performed following the standard protocol without IV contrast. Sagittal and coronal MPR images reconstructed from axial data set. COMPARISON:  04/30/2017 ; chest radiographs 05/06/2017, 05/04/2017 and earlier FINDINGS: Cardiovascular: Atherosclerotic calcifications aorta and proximal great vessels. Small pericardial effusion. Aorta normal caliber. Mediastinum/Nodes: Base of cervical region unremarkable. Proximal thoracic esophagus normal appearance with esophagus contiguous with tumor from LEFT lung with which invades the mediastinum. No esophageal dilatation. No definite thoracic adenopathy, though LEFT hilar assessment is severely limited. Lungs/Pleura: Complete collapse of the LEFT lung since prior exam with again identified large LEFT hydropneumothorax. Associated depression of the LEFT diaphragm. Abnormal soft tissue at the medial RIGHT lung extending from upper lobe into mediastinum, abutting the esophagus, LEFT pulmonary artery, and descending thoracic aorta. Abrupt cut off of the LEFT mainstem bronchus by tumor. Small  to moderate RIGHT pleural effusion with compressive atelectasis of the RIGHT lower lobe. Underlying emphysematous changes. Scattered respiratory motion artifacts. Central peribronchial  thickening. No definite pulmonary mass/nodule or acute infiltrate. Upper Abdomen: Dependent gallstones in gallbladder. Mild collecting system dilatation at upper pole RIGHT kidney. Atrophic pancreas. Remaining visualized upper abdomen unremarkable. Musculoskeletal: No acute osseous findings IMPRESSION: Large LEFT hydropneumothorax with depression of LEFT diaphragm as noted on prior chest radiographs. Probable large tumor mass at the central LEFT lung invading the mediastinum and abutting the esophagus, descending thoracic aorta, and LEFT pulmonary artery, with associated with abrupt cut off of the LEFT mainstem bronchus and complete collapse of the LEFT lung progressive since prior exam. Small pericardial effusion. Increased RIGHT pleural effusion and compressive atelectasis of the posterior RIGHT lung since prior exam. Question RIGHT hydronephrosis. Cholelithiasis. Aortic Atherosclerosis (ICD10-I70.0). Emphysema (ICD10-J43.9). Electronically Signed   By: Lavonia Dana M.D.   On: 05/06/2017 22:01   US Thoracentesis Asp Pleural Space W/img Guide  Result Date: 05/06/2017 INDICATION: Patient with stage IV lung cancer with hydropneumothorax status post thoracentesis x2. Persistent hypoxia and recurrence of pleural fluid in free space. Request is made for diagnostic therapeutic thoracentesis. EXAM: ULTRASOUND GUIDED DIAGNOSTIC AND THERAPEUTIC THORACENTESIS MEDICATIONS: 1% lidocaine COMPLICATIONS: None immediate. PROCEDURE: An ultrasound guided thoracentesis was thoroughly discussed with the patient and questions answered. The benefits, risks, alternatives and complications were also discussed. The patient understands and wishes to proceed with the procedure. Written consent was obtained. Ultrasound was performed to localize and mark an adequate pocket of fluid in the left chest. The area was then prepped and draped in the normal sterile fashion. 1% Lidocaine was used for local anesthesia. Under ultrasound guidance a  Safe-T-Centesis catheter was introduced. Thoracentesis was performed. The catheter was removed and a dressing applied. FINDINGS: A total of approximately 0.5 L of turbid yellow, brown fluid was removed. Samples were sent to the laboratory as requested by the clinical team. IMPRESSION: Successful ultrasound guided left thoracentesis yielding 0.5 L of pleural fluid. The fluid was more complex appearing. After reviewing his imaging with Dr. Annamaria Boots, it was felt that only remove about 500 cc of fluid as if he were to need a drained we still would like fluid to be present. Samples were sent to the lab determine whether he will eventually need a drain or not. Read by: Saverio Danker, PA-C Electronically Signed   By: Jerilynn Mages.  Shick M.D.   On: 05/06/2017 16:13     Results for CAIDENCE, HIGASHI (MRN [id number]) as of 05/07/2017 12:01  Ref. Range 05/06/2017 14:58  Albumin, Fluid Latest Units: g/dL <1.0  Fluid Type-FALB Unknown PLEURAL  Amylase, Fluid Latest Units: U/L <5  Fluid Type-FAMY Unknown PLEURAL  Glucose, Fluid Latest Units: mg/dL <20  Fluid Type-FGLU Unknown PLEURAL  Fluid Type-FLDH Unknown PLEURAL  LD, Fluid Latest Ref Range: 3 - 23 U/L 4,204 (H)  Total protein, fluid Latest Units: g/dL <3.0  Fluid Type-FCT Unknown PLEURAL  Fluid Type-FTP Unknown PLEURAL  Color, Fluid Latest Ref Range: YELLOW  YELLOW  WBC, Fluid Latest Ref Range: 0 - 1,000 cu mm 19,577 (H)  Appearance, Fluid Latest Ref Range: CLEAR  TURBID (A)  Other Cells, Fluid Latest Units: % CORRELATE WITH CY...  Neutrophil Count, Fluid Latest Ref Range: 0 - 25 % 86 (H)  Monocyte-Macrophage-Serous Fluid Latest Ref Range: 50 - 90 % 14 (L)    VITAKLS  BP (!) 112/51 (BP Location: Right Arm)   Pulse 87   Temp 97.7 F (36.5 C) (  Oral)   Resp (!) 22   Ht 5\' 11"  (1.803 m)   Wt 80.5 kg (177 lb 7.5 oz)   SpO2 98%   BMI 24.75 kg/m     Filed Weights   05/04/17 0413 05/05/17 0441 05/07/17 0500  Weight: 81.7 kg (180 lb 1.9 oz) 82.1 kg (180 lb 16  oz) 80.5 kg (177 lb 7.5 oz)     Intake/Output Summary (Last 24 hours) at 05/07/2017 1200 Last data filed at 05/07/2017 0721 Gross per 24 hour  Intake 1890 ml  Output 1350 ml  Net 540 ml     General Appearance:    Looksvery deconditioned. In bed .Frail cachectic  Head:    Normocephalic, without obvious abnormality, atraumatic  Eyes:    PERRL - yes, conjunctiva/corneas - clear      Ears:    Normal external ear canals, both ears  Nose:   NG tube - no  Throat:  ETT TUBE - no , OG tube - no  Neck:   Supple,  No enlargement/tenderness/nodules     Lungs:     Reduced air entry to left  Chest wall:    No deformity  Heart:    S1 and S2 normal, no murmur, CVP - no.  Pressors - no  Abdomen:     Soft, no masses, no organomegaly  Genitalia:    Not done  Rectal:   not done  Extremities:   Extremities- intact     Skin:   Intact in exposed areas .      Neurologic:   Sedation - none -> RASS - +1 . Moves all 4s - yes. CAM-ICU - neg . Orientation - x3+       Assessment/Plan: Empyema of pleura (HCC) Repeated thora clearly shows EMPYEMA with gluc < 20 and high LDH and high polys. These are all c/w acute bacterial process. Culture results are irrelevant. While this could still be malignancy (with cyto being non diag), the presence of acute inflammation with urine strep atngigen positive all c/w acute bacterial empyema and he is accumulating lot of this.  This process itself can leave patients deconditioned and failure to thrive if not drained. I belive in this patient this +/- cancer contributing to [facility] to thrive  Plan - given his desire for full code + potentially reversible/Treatable process of empyema (bacterial) causing FTT -> I recommend Pleural Drain  - we can address fibrinolytics later - if he fails this over next several days / few weeks, we can address goals of care and comfort status - agree though that the trapped lung might make it dificult to ultimately for him to get off the  tube but given empyema I think we have no choice but to drain (surgery risky)   Explained to patient   > 50% of this > 40 min visit spent in face to face counseling or/and coordination of care    Dr. Brand Males, M.D., [facility].C.P Pulmonary and Critical Care Medicine Staff Physician, [place] Director - Interstitial Lung Disease  Program  Pulmonary [place] [facility] [employer], [place], [address]  Pager: [phone], If no answer or between  15:00h - 7:00h: call [phone] Telephone: [phone]

## 2017-05-07 NOTE — Progress Notes (Signed)
Routine nebulizer administered. After approximately 3 minutes into the medication, patient asked to stop and remove the medication due to a "burn feeling" from it. He did, however, wish to continue with the pulmicort. Unfortunatly, a repeat event took place and he asked that it be stopped after approximately 1 minute. RT will continue to follow.

## 2017-05-07 NOTE — Care Management Important Message (Signed)
Important Message  Patient Details  Name: Stellan Vick MRN: [id number] Date of Birth: [birth year]   Medicare Important Message Given:  Yes    Kerin Salen 05/07/2017, 11:04 AMImportant Message  Patient Details  Name: Zayn Selley MRN: [id number] Date of Birth: 1940/07/18   Medicare Important Message Given:  Yes    Kerin Salen 05/07/2017, 11:03 AM

## 2017-05-08 ENCOUNTER — Ambulatory Visit: Payer: Medicare Other

## 2017-05-08 ENCOUNTER — Inpatient Hospital Stay (HOSPITAL_COMMUNITY): Payer: Medicare Other

## 2017-05-08 ENCOUNTER — Ambulatory Visit
Admission: RE | Admit: 2017-05-08 | Discharge: 2017-05-08 | Disposition: A | Discharge status: Home / Self Care | Payer: Medicare Other | Source: Ambulatory Visit | Attending: Radiation Oncology | Admitting: Radiation Oncology

## 2017-05-08 ENCOUNTER — Encounter (HOSPITAL_COMMUNITY): Payer: Self-pay | Admitting: Radiology

## 2017-05-08 ENCOUNTER — Ambulatory Visit: Payer: TRICARE For Life (TFL)

## 2017-05-08 DIAGNOSIS — Z978 Presence of other specified devices: Secondary | ICD-10-CM

## 2017-05-08 DIAGNOSIS — R627 Adult failure to thrive: Secondary | ICD-10-CM

## 2017-05-08 DIAGNOSIS — K121 Other forms of stomatitis: Secondary | ICD-10-CM

## 2017-05-08 LAB — GLUCOSE, CAPILLARY
GLUCOSE-CAPILLARY: 114 mg/dL — AB (ref 65–99)
GLUCOSE-CAPILLARY: 131 mg/dL — AB (ref 65–99)
Glucose-Capillary: 124 mg/dL — ABNORMAL HIGH (ref 65–99)
Glucose-Capillary: 172 mg/dL — ABNORMAL HIGH (ref 65–99)

## 2017-05-08 LAB — CBC WITH DIFFERENTIAL/PLATELET
BASOS PCT: 0 %
Basophils Absolute: 0 10*3/uL (ref 0.0–0.1)
EOS ABS: 0 10*3/uL (ref 0.0–0.7)
EOS PCT: 0 %
HCT: 36.1 % — ABNORMAL LOW (ref 39.0–52.0)
HEMOGLOBIN: 11.2 g/dL — AB (ref 13.0–17.0)
LYMPHS ABS: 0.9 10*3/uL (ref 0.7–4.0)
Lymphocytes Relative: 6 %
MCH: 29.9 pg (ref 26.0–34.0)
MCHC: 31 g/dL (ref 30.0–36.0)
MCV: 96.3 fL (ref 78.0–100.0)
MONO ABS: 0.6 10*3/uL (ref 0.1–1.0)
Monocytes Relative: 4 %
NEUTROS PCT: 90 %
Neutro Abs: 13.3 10*3/uL — ABNORMAL HIGH (ref 1.7–7.7)
Platelets: 193 10*3/uL (ref 150–400)
RBC: 3.75 MIL/uL — ABNORMAL LOW (ref 4.22–5.81)
RDW: 14.1 % (ref 11.5–15.5)
WBC: 14.7 10*3/uL — ABNORMAL HIGH (ref 4.0–10.5)

## 2017-05-08 LAB — CHOLESTEROL, BODY FLUID: CHOL FL: 23 mg/dL

## 2017-05-08 LAB — PROTIME-INR
INR: 1.28
Prothrombin Time: 15.9 seconds — ABNORMAL HIGH (ref 11.4–15.2)

## 2017-05-08 LAB — BASIC METABOLIC PANEL
Anion gap: 6 (ref 5–15)
BUN: 18 mg/dL (ref 6–20)
CHLORIDE: 98 mmol/L — AB (ref 101–111)
CO2: 35 mmol/L — AB (ref 22–32)
CREATININE: 0.36 mg/dL — AB (ref 0.61–1.24)
Calcium: 8.1 mg/dL — ABNORMAL LOW (ref 8.9–10.3)
GFR calc Af Amer: >60 mL/min (ref >60)
GFR calc non Af Amer: >60 mL/min (ref >60)
Glucose, Bld: 139 mg/dL — ABNORMAL HIGH (ref 65–99)
POTASSIUM: 3.6 mmol/L (ref 3.5–5.1)
Sodium: 139 mmol/L (ref 135–145)

## 2017-05-08 LAB — PH, BODY FLUID: pH, Body Fluid: 6.8

## 2017-05-08 LAB — MAGNESIUM: MAGNESIUM: 1.8 mg/dL (ref 1.7–2.4)

## 2017-05-08 LAB — PHOSPHORUS: PHOSPHORUS: 2.7 mg/dL (ref 2.5–4.6)

## 2017-05-08 MED ORDER — FENTANYL CITRATE (PF) 100 MCG/2ML IJ SOLN
INTRAMUSCULAR | Status: AC
  Filled 2017-05-08: qty 2

## 2017-05-08 MED ORDER — MIDAZOLAM HCL 2 MG/2ML IJ SOLN
INTRAMUSCULAR | Status: AC | PRN
  Administered 2017-05-08: 0.5 mg via INTRAVENOUS

## 2017-05-08 MED ORDER — FENTANYL CITRATE (PF) 100 MCG/2ML IJ SOLN
INTRAMUSCULAR | Status: AC | PRN
  Administered 2017-05-08: 50 ug via INTRAVENOUS

## 2017-05-08 MED ORDER — LIDOCAINE HCL (PF) 1 % IJ SOLN
INTRAMUSCULAR | Status: AC | PRN
  Administered 2017-05-08: 30 mL

## 2017-05-08 MED ORDER — MIDAZOLAM HCL 2 MG/2ML IJ SOLN
INTRAMUSCULAR | Status: AC
  Filled 2017-05-08: qty 4

## 2017-05-08 NOTE — Progress Notes (Signed)
[facility]    Date of Admission:  04/24/2017   Total days of antibiotics 15        Day 10           ID: Maurice Tyler is a 77 y.o. male  presented with dyspnea, hypoxia, and productive cough. He has hx of Stage IV NSCLC (squamous cell) and started chemotherapy Marita [place], carboplatin, paclitaxel) 3 weeks prior to admission. He was found to have multifocal pneumonia and large Lt pleural effusion.   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)   Non-small cell cancer of left lung (HCC)   Goals of care, counseling/discussion   Palliative care encounter   Streptococcal pneumonia ([place])   Malignant neoplasm of left lung ([place])    Subjective: Underwent pleural catheter placement today. Denies pain at site. No cough or fever. Appears poor nutritional intake, his lunch tray is untouched  Medications:  . amiodarone  400 mg Oral BID  . budesonide (PULMICORT) nebulizer solution  0.25 mg Nebulization BID  . chlorhexidine  15 mL Mouth Rinse BID  . enoxaparin (LOVENOX) injection  40 mg Subcutaneous QHS  . feeding supplement  1 Container Oral BID BM  . fentaNYL      . furosemide  20 mg Intravenous Daily  . Gerhardt's butt cream   Topical TID  . insulin aspart  0-9 Units Subcutaneous TID WC  . ipratropium  0.5 mg Nebulization Q6H  . levalbuterol  0.63 mg Nebulization Q6H  . mouth rinse  15 mL Mouth Rinse q12n4p  . metoprolol tartrate  25 mg Oral BID  . midazolam        Objective: Vital signs in last 24 hours: Temp:  [97.5 F (36.4 C)-98.4 F (36.9 C)] 97.9 F (36.6 C) (01/04 1511) Pulse Rate:  [84-125] 85  (01/04 1511) Resp:  [20-33] 21 (01/04 1511) BP: (102-151)/(49-74) 116/54 (01/04 1511) SpO2:  [78 %-100 %] 95 % (01/04 1511) Weight:  [180 lb (81.6 kg)] 180 lb (81.6 kg) (01/04 0516) Physical Exam  Constitutional: He is oriented to person, place, and time. He appears malnourished and frail. No distress.  HENT: bitemporal wasting. Mouth/Throat: Oropharynx is clear and moist. No oropharyngeal exudate. Healing ulcer to left side of mouth Cardiovascular: Normal rate, regular rhythm and normal heart sounds. Exam reveals no gallop and no friction rub.  No murmur heard.  Pulmonary/Chest: Effort normal and breath sounds are decreased on left. Left sided catheter/chest tube in place Abdominal: Soft. Bowel sounds are normal. He exhibits no distension. There is no tenderness.  Skin: Skin is warm and dry. No rash noted. No erythema. Scattered echymosis Psychiatric: He has a normal mood and affect. His behavior is normal.    Lab Results Recent Labs    05/06/17 0316 05/07/17 0411 05/08/17 0422  WBC 16.2* 11.9* 14.7*  HGB 11.7* 11.0* 11.2*  HCT 37.0* 35.9* 36.1*  NA 137  --  139  K 4.1  --  3.6  CL 102  --  98*  CO2 28  --  35*  BUN 19  --  18  CREATININE  0.38*  --  0.36*   Liver Panel Recent Labs    05/06/17 0316  PROT 4.8*  ALBUMIN 1.7*  AST 16  ALT 32  ALKPHOS 184*  BILITOT 0.6    Microbiology: 12/23 pleural fluid strep viridans 12/22 ur strep pneumo + Studies/Results: Ct Chest Wo Contrast  Result Date: 05/06/2017 CLINICAL DATA:  LEFT empyema, stage IV LEFT lung cancer diabetes mellitus EXAM: CT CHEST WITHOUT CONTRAST TECHNIQUE: Multidetector CT imaging of the chest was performed following the standard protocol without IV contrast. Sagittal and coronal MPR images reconstructed from axial data set. COMPARISON:  04/30/2017 ; chest radiographs 05/06/2017, 05/04/2017 and earlier FINDINGS: Cardiovascular: Atherosclerotic calcifications aorta and proximal great vessels. Small  pericardial effusion. Aorta normal caliber. Mediastinum/Nodes: Base of cervical region unremarkable. Proximal thoracic esophagus normal appearance with esophagus contiguous with tumor from LEFT lung with which invades the mediastinum. No esophageal dilatation. No definite thoracic adenopathy, though LEFT hilar assessment is severely limited. Lungs/Pleura: Complete collapse of the LEFT lung since prior exam with again identified large LEFT hydropneumothorax. Associated depression of the LEFT diaphragm. Abnormal soft tissue at the medial RIGHT lung extending from upper lobe into mediastinum, abutting the esophagus, LEFT pulmonary artery, and descending thoracic aorta. Abrupt cut off of the LEFT mainstem bronchus by tumor. Small to moderate RIGHT pleural effusion with compressive atelectasis of the RIGHT lower lobe. Underlying emphysematous changes. Scattered respiratory motion artifacts. Central peribronchial thickening. No definite pulmonary mass/nodule or acute infiltrate. Upper Abdomen: Dependent gallstones in gallbladder. Mild collecting system dilatation at upper pole RIGHT kidney. Atrophic pancreas. Remaining visualized upper abdomen unremarkable. Musculoskeletal: No acute osseous findings IMPRESSION: Large LEFT hydropneumothorax with depression of LEFT diaphragm as noted on prior chest radiographs. Probable large tumor mass at the central LEFT lung invading the mediastinum and abutting the esophagus, descending thoracic aorta, and LEFT pulmonary artery, with associated with abrupt cut off of the LEFT mainstem bronchus and complete collapse of the LEFT lung progressive since prior exam. Small pericardial effusion. Increased RIGHT pleural effusion and compressive atelectasis of the posterior RIGHT lung since prior exam. Question RIGHT hydronephrosis. Cholelithiasis. Aortic Atherosclerosis (ICD10-I70.0). Emphysema (ICD10-J43.9). Electronically Signed   By: Lavonia Dana M.D.   On: 05/06/2017 22:01      Assessment/Plan: 77 y.o. male  presented with dyspnea, hypoxia, and productive cough. He has hx of Stage IV NSCLC (squamous cell) and started chemotherapy Marita [place], carboplatin, paclitaxel) admitted for multifocal pneumonia and empyema s/p chest tube placement  - continue on ceftriaxone 2gm IV daily while hospitalized then can consider switching to an oral regimen for addn time for total of 28d, given empyema  Failure to thrive = encourage good po intake  NSCLC and prognosis = likely poor given CT findings and burden of disease.  Will see back on Monday. Dr comer available for questions over the weekend.  [facility] for Infectious Diseases Cell: [phone] Pager: [phone]  05/08/2017, 4:04 PM

## 2017-05-08 NOTE — Progress Notes (Signed)
Nutrition Follow-up  DOCUMENTATION CODES:   Non-severe (moderate) malnutrition in context of chronic illness  INTERVENTION:    Boost Breeze po BID, each supplement provides 250 kcal and 9 grams of protein  Carnation Instant Breakfast PO BID, each supplement provides 220 kcal and 13 grams of protein.   Continue to encourage PO intake and supplements  NUTRITION DIAGNOSIS:   Moderate Malnutrition related to chronic illness, cancer and cancer related treatments as evidenced by percent weight loss, energy intake < or equal to 50% for > or equal to 1 month, moderate fat depletion, moderate muscle depletion.  Ongoing  GOAL:   Patient will meet greater than or equal to 90% of their needs  Not Meeting  MONITOR:   PO intake, Supplement acceptance, Weight trends, Labs, Skin  REASON FOR ASSESSMENT:   Malnutrition Screening Tool    ASSESSMENT:   Pt with PMH significant for DM and recently diagnosed with non-small cell lung carcinoma. Pt started chemotherapy last week and has had increasing shortness of breath with productive cough since. Admitted for acute respiratory failure with sepsis from multifocal PNA and large left PE.    1/2- left thoracentesis 500 ml drained  RD spoke with pt at bedside. Reports he is eating a little more but not much. Observed untouched lunch tray at bedside. Pt reports he is drinking boost breeze but unsure if true. Intake continues to be very poor. Pt refused 5 out of his last 8 meals. Ate 25% of breakfast today. If aggressive treatments wanted by family would recommend other route of nutrition as PO intake is not meeting needs.   Weight continues to increase steadily. Suspect from fluid accumulation.   Palliative saw pt 1/2- pt too lethargic to speak, spoke with daughter on the phone no goals of care discussed at that time. Palliative to follow up.   Medications reviewed and include: lasix, SSI, NS @ 50 ml/hr, IV abx  Labs reviewed: Cl 98 (L)   Diet  Order:  Diet regular Room service appropriate? Yes; Fluid consistency: Thin  EDUCATION NEEDS:   Education needs have been addressed  Skin:  Skin Assessment: Skin Integrity Issues: Skin Integrity Issues:: Stage I Stage I: buttocks  Last BM:  05/08/16  Height:   Ht Readings from Last 1 Encounters:  04/25/17 5\' 11"  (1.803 m)    Weight:   Wt Readings from Last 1 Encounters:  05/08/17 180 lb (81.6 kg)    Ideal Body Weight:  78.2 kg  BMI:  Body mass index is 25.1 kg/m.  Estimated Nutritional Needs:   Kcal:  2000-2200 kcal/day  Protein:  100-105 g/day  Fluid:  >2 L/day    Mariana Single RD, LDN Clinical Nutrition Pager # - [phone]

## 2017-05-08 NOTE — Progress Notes (Addendum)
[facility] Date of Admission: 04/24/2017 Date of Consult: 04/25/2017 Referring Provider: Dr. Hal Hope, [facility] Chief Complaint: Short of breath  HPI: 77 yo male former smoker presented with dyspnea, hypoxia, and productive cough.  He has hx of Stage IV NSCLC (squamous cell) and started chemotherapy Marita [place], carboplatin, paclitaxel) 3 weeks prior to admission.  He was found to have multifocal pneumonia and large Lt pleural effusion.  Cultures: Blood 12/21 >>  Blood 12/22 >>  Pneumococcal Ag 12/22 >> positive Legionella Ag 12/22 >> negative Lt pleural fluid >> GPC in chains  Antibiotics: Vancomycin 12/21 >> 12/24  Cefepime 12/21 >> 12/25 Zithromax 12/21 >> 12/25 Rocephin 12/26 >>  Studies: CT angio chest 12/21 >> Lt mediastinal mass with compression of Lt main bronchus and Lt PA, large Lt effusion, ASD on Lt, patchy ASD on Rt, emphysema Echo 12/22 >> EF 60 to 65%, PAS 62 mmHg Lt thoracentesis 12/23 >> 1.4 liters, glucose < 20, protein < 3, WBC 78,475 (93%N). No cancer cells Lt thora 12/27 - acute inflammation. No cancer cells   Events: 12/22 Admit 05/06/2017 Rtx oncology continues 1/2 IR calliing concerning need for placement of pleural drain?  Lines/tubes:  Consults: 12/26 ID 12/26 Oncology 12/26 Cardiology 12/26 Palliative care BP 119/67 (BP Location: Right Arm)   Pulse 84   Temp 97.7 F (36.5 C) (Axillary)   Resp (!) 22   Ht 5\' 11"  (1.803 m)   Wt 180 lb (81.6 kg)   SpO2 (!) 78% Comment: RN aware  BMI 25.10 kg/m     Intake/Output Summary (Last 24 hours) at 05/08/2017 1453 Last data filed at 05/08/2017 1433 Gross per 24 hour  Intake 1760 ml  Output 4350 ml  Net -2590 ml     Subjective: Feels a little better  physical exam: General: Frail 77 year old white male resting comfortably in bed HEENT: Normocephalic atraumatic some temporal wasting noted mucous membranes moist face mask in place Pulmonary: Decreased left greater than right  no accessory use left chest tube without air leak serous drainage noted Cardiac: Regular rate and rhythm Abdomen: Soft nontender Extremities: Brisk cap refill positive bowel sounds Neuro: Awake and oriented   Recent Labs  Lab 05/05/17 0333 05/06/17 0316 05/08/17 0422  NA 136 137 139  K 4.5 4.1 3.6  CL 103 102 98*  CO2 26 28 35*  BUN 14 19 18   CREATININE 0.40* 0.38* 0.36*  GLUCOSE 131* 178* 139*   Recent Labs  Lab 05/06/17 0316 05/07/17 0411 05/08/17 0422  HGB 11.7* 11.0* 11.2*  HCT 37.0* 35.9* 36.1*  WBC 16.2* 11.9* 14.7*  PLT 244 202 193   Dg Chest 1 View  Result Date: 05/06/2017 CLINICAL DATA:  Status post left thoracentesis EXAM: CHEST 1 VIEW COMPARISON:  05/04/2017 FINDINGS: Stable hydropneumothorax is noted on the left. Considerable consolidated lung is again identified and stable from previous exams. The right lung is clear. Cardiac shadow is stable. No bony abnormality is seen. IMPRESSION: Stable left hydropneumothorax. Electronically Signed   By: Inez Catalina M.D.   On: 05/06/2017 15:25   Ct Chest Wo Contrast  Result Date: 05/06/2017 CLINICAL DATA:  LEFT empyema, stage IV LEFT lung cancer diabetes mellitus EXAM: CT CHEST WITHOUT CONTRAST TECHNIQUE: Multidetector CT imaging of the chest was performed following the standard protocol without IV contrast. Sagittal and coronal MPR images reconstructed from axial data set. COMPARISON:  04/30/2017 ; chest radiographs 05/06/2017, 05/04/2017 and earlier FINDINGS: Cardiovascular: Atherosclerotic calcifications aorta and proximal great vessels. Small pericardial effusion. Aorta  normal caliber. Mediastinum/Nodes: Base of cervical region unremarkable. Proximal thoracic esophagus normal appearance with esophagus contiguous with tumor from LEFT lung with which invades the mediastinum. No esophageal dilatation. No definite thoracic adenopathy, though LEFT hilar assessment is severely limited. Lungs/Pleura: Complete collapse of the LEFT lung  since prior exam with again identified large LEFT hydropneumothorax. Associated depression of the LEFT diaphragm. Abnormal soft tissue at the medial RIGHT lung extending from upper lobe into mediastinum, abutting the esophagus, LEFT pulmonary artery, and descending thoracic aorta. Abrupt cut off of the LEFT mainstem bronchus by tumor. Small to moderate RIGHT pleural effusion with compressive atelectasis of the RIGHT lower lobe. Underlying emphysematous changes. Scattered respiratory motion artifacts. Central peribronchial thickening. No definite pulmonary mass/nodule or acute infiltrate. Upper Abdomen: Dependent gallstones in gallbladder. Mild collecting system dilatation at upper pole RIGHT kidney. Atrophic pancreas. Remaining visualized upper abdomen unremarkable. Musculoskeletal: No acute osseous findings IMPRESSION: Large LEFT hydropneumothorax with depression of LEFT diaphragm as noted on prior chest radiographs. Probable large tumor mass at the central LEFT lung invading the mediastinum and abutting the esophagus, descending thoracic aorta, and LEFT pulmonary artery, with associated with abrupt cut off of the LEFT mainstem bronchus and complete collapse of the LEFT lung progressive since prior exam. Small pericardial effusion. Increased RIGHT pleural effusion and compressive atelectasis of the posterior RIGHT lung since prior exam. Question RIGHT hydronephrosis. Cholelithiasis. Aortic Atherosclerosis (ICD10-I70.0). Emphysema (ICD10-J43.9). Electronically Signed   By: Lavonia Dana M.D.   On: 05/06/2017 22:01   US Thoracentesis Asp Pleural Space W/img Guide  Result Date: 05/06/2017 INDICATION: Patient with stage IV lung cancer with hydropneumothorax status post thoracentesis x2. Persistent hypoxia and recurrence of pleural fluid in free space. Request is made for diagnostic therapeutic thoracentesis. EXAM: ULTRASOUND GUIDED DIAGNOSTIC AND THERAPEUTIC THORACENTESIS MEDICATIONS: 1% lidocaine COMPLICATIONS: None  immediate. PROCEDURE: An ultrasound guided thoracentesis was thoroughly discussed with the patient and questions answered. The benefits, risks, alternatives and complications were also discussed. The patient understands and wishes to proceed with the procedure. Written consent was obtained. Ultrasound was performed to localize and mark an adequate pocket of fluid in the left chest. The area was then prepped and draped in the normal sterile fashion. 1% Lidocaine was used for local anesthesia. Under ultrasound guidance a Safe-T-Centesis catheter was introduced. Thoracentesis was performed. The catheter was removed and a dressing applied. FINDINGS: A total of approximately 0.5 L of turbid yellow, brown fluid was removed. Samples were sent to the laboratory as requested by the clinical team. IMPRESSION: Successful ultrasound guided left thoracentesis yielding 0.5 L of pleural fluid. The fluid was more complex appearing. After reviewing his imaging with Dr. Annamaria Boots, it was felt that only remove about 500 cc of fluid as if he were to need a drained we still would like fluid to be present. Samples were sent to the lab determine whether he will eventually need a drain or not. Read by: Saverio Danker, PA-C Electronically Signed   By: Jerilynn Mages.  Shick M.D.   On: 05/06/2017 16:13     Results for Maurice Tyler, Maurice Tyler (MRN [id number]) as of 05/07/2017 12:01  Ref. Range 05/06/2017 14:58  Albumin, Fluid Latest Units: g/dL <1.0  Fluid Type-FALB Unknown PLEURAL  Amylase, Fluid Latest Units: U/L <5  Fluid Type-FAMY Unknown PLEURAL  Glucose, Fluid Latest Units: mg/dL <20  Fluid Type-FGLU Unknown PLEURAL  Fluid Type-FLDH Unknown PLEURAL  LD, Fluid Latest Ref Range: 3 - 23 U/L 4,204 (H)  Total protein, fluid Latest Units: g/dL <3.0  Fluid Type-FCT  Unknown PLEURAL  Fluid Type-FTP Unknown PLEURAL  Color, Fluid Latest Ref Range: YELLOW  YELLOW  WBC, Fluid Latest Ref Range: 0 - 1,000 cu mm 19,577 (H)  Appearance, Fluid Latest Ref Range:  CLEAR  TURBID (A)  Other Cells, Fluid Latest Units: % CORRELATE WITH CY...  Neutrophil Count, Fluid Latest Ref Range: 0 - 25 % 86 (H)  Monocyte-Macrophage-Serous Fluid Latest Ref Range: 50 - 90 % 14 (L)      Assessment/Plan:  Left Empyema/infected pleural space. + strep  superimposed on stage IV squamous cell lung cancer with what appears to be failure to thrive and severe protein calorie malnutrition Now s/p chest tube placement 1/4 Plan Cont chest tube drainage F/u serial cxr ABX per ID  We will see again on Monday, suspect over all prognosis very poor   STAFF MD NOTE  STAFF NOTE: I, Dr Ann Lions have personally reviewed patient's available data, including medical history, events of note, physical examination and test results as part of my evaluation. I have discussed with resident/NP and other care providers such as pharmacist, RN and RRT.  In addition,  I personally evaluated patient and elicited key findings of   S: bacterial empyema. 3rd thora 1/2.18  Again cytology non diag. S/ p Drain left chest 05/08/2017 - > per RN 2L driained out immediately and since then 600cc. He is feeling better. Says able to talk more. O2 weaned from 10L  [place] to 6L [place]  O: cacectic ECOG 4 Left drain chest - > turbid fluid No distress   A: Empyema left bacterial . Doubt cancerous fluid. In setting of stage 4 NSCLC with chemo ECOG 4  P: continue chest tube drain Get cxr PCCM reassess again Monday 05/21/17 Abx as below If these measuers do not help, then palliate Need time limited trial of several days to few weeks Anti-infectives (From admission, onward)   Start     Dose/Rate Route Frequency Ordered Stop   04/29/17 1200  cefTRIAXone (ROCEPHIN) 2 g in dextrose 5 % 50 mL IVPB     2 g 100 mL/hr over 30 Minutes Intravenous Every 24 hours 04/29/17 1100     04/25/17 1200  vancomycin (VANCOCIN) IVPB 750 mg/150 ml premix  Status:  Discontinued     750 mg 150 mL/hr over 60 Minutes Intravenous Every  12 hours 04/25/17 0835 04/27/17 1406   04/25/17 1000  ceFEPIme (MAXIPIME) 1 g in dextrose 5 % 50 mL IVPB  Status:  Discontinued     1 g 100 mL/hr over 30 Minutes Intravenous Every 8 hours 04/25/17 0835 04/29/17 1100   04/25/17 0015  ceFEPIme (MAXIPIME) 2 g in dextrose 5 % 50 mL IVPB     2 g 100 mL/hr over 30 Minutes Intravenous  Once 04/25/17 0001 04/25/17 0119   04/25/17 0015  vancomycin (VANCOCIN) IVPB 1000 mg/200 mL premix     1,000 mg 200 mL/hr over 60 Minutes Intravenous  Once 04/25/17 0001 04/25/17 0259   04/25/17 0015  azithromycin (ZITHROMAX) 500 mg in dextrose 5 % 250 mL IVPB  Status:  Discontinued     500 mg 250 mL/hr over 60 Minutes Intravenous Daily at bedtime 04/25/17 0009 04/29/17 1100        .  Rest per NP/medical resident whose note is outlined above and that I agree with   Dr. Brand Males, M.D., [facility].C.P Pulmonary and Critical Care Medicine Staff Physician [facility] Pager: [phone], If no answer  or between  15:00h - 7:00h: call [phone]  05/08/2017 4:05 PM

## 2017-05-08 NOTE — Progress Notes (Signed)
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 Patient being currently managed for multiofocal pneumonia and left-sided pleural effusion.  Subjective: Patient seen and examined, feeling better today. No acute events overnight. 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)   Diabetes mellitus type 2 in nonobese (HCC)   Acute respiratory failure with hypoxia and hypercapnia (HCC)   Severe sepsis (HCC)   Malnutrition of moderate degree   Pressure injury of skin   Empyema lung (HCC)   New onset a-fib (HCC)   AKI (acute kidney injury) ([place])   Non-small cell cancer of left lung (HCC)   Goals of care, counseling/discussion   Palliative care encounter   Streptococcal pneumonia ([place])   Malignant neoplasm of left lung ([place])  Acute respiratory failure with hypoxia and hypercarbia / Sepsis secondary to streptococcal multifocal pneumonia / Empyema w +/- malignant effusion  - CXR on admission showed left perihilar soft tissue mass consistent with known malignancy. - Pt is status post thoracentesis 12/23 - 1.4 L fluid removed; thoracentesis 12/27 - 2.3 L fluid, 05/06/17 0.5L removed fluid analysis c/w bacteria empyema - s/p Pig tail placement  - Blood cx showed no growth - Pleural fluid cx grew strep viridans  - Per ID recommendation continue Rocephin while inpatient, upon discharge can change to oral formulation to complete 28 days given empyema    Leukocytosis: due to above - continue to monitor    Edema - continues to improve   - Hypoproteinemia, 3rd spacing. - Will check albumin in AM if still below 2 will add another dose  - Continue lasix IV 20 mg for now  - Monitor Cr while on Lasix  - D/c IVF  Metastatic stage IV lung cancer - Follows with Dr. [name] - On radio and chemotherapy  - Will hold next chemo   Afib with RVR - stable  - CHA2DS2-VASc Score 2 -Continue amiodarone -Continue metoprolol - cards saw, recommended against anticoagulation given new afib and significant commorbidities (see 12/27 cards c/s note)  AKI - Resolved  -Secondary to sepsis - Continue to monitor   Diabetes type 2 without complications- Stable  -SSI  - Monitor CBG's   Moderate protein calorie malnutrition -In the setting of chronic illness -Follow nutrition   Skin pressure injury, stage 1(left armpit) / Stage 2 sacral ulcer - Wound care consulted - Care per RN  Urinary retention  - Unclear etiology, ? Hypoperfusion from 3rd spacing, acute illness  - Continue foley for now  - Check PSA and renal US   DVT prophylaxis: Lovenox  Code Status: Full  Family Communication: None at bedside  Disposition Plan: TBD   Consultants:   IR  PCCM  ID   Palliative   Procedures:   Thoracentesis 12/23 and 12/27   Antimicrobials: Anti-infectives (From admission, onward)   Start     Dose/Rate Route Frequency Ordered Stop   04/29/17 1200  cefTRIAXone (ROCEPHIN) 2 g in dextrose 5 % 50 mL IVPB     2 g 100 mL/hr over 30 Minutes  Intravenous Every 24 hours 04/29/17 1100     04/25/17 1200  vancomycin (VANCOCIN) IVPB 750 mg/150 ml premix  Status:  Discontinued     750 mg 150 mL/hr over 60 Minutes Intravenous Every 12 hours 04/25/17 0835 04/27/17 1406   04/25/17 1000  ceFEPIme (MAXIPIME) 1 g in dextrose 5 % 50 mL IVPB  Status:  Discontinued     1 g 100 mL/hr over 30 Minutes Intravenous Every 8 hours 04/25/17 0835 04/29/17 1100   04/25/17 0015  ceFEPIme (MAXIPIME) 2 g in  dextrose 5 % 50 mL IVPB     2 g 100 mL/hr over 30 Minutes Intravenous  Once 04/25/17 0001 04/25/17 0119   04/25/17 0015  vancomycin (VANCOCIN) IVPB 1000 mg/200 mL premix     1,000 mg 200 mL/hr over 60 Minutes Intravenous  Once 04/25/17 0001 04/25/17 0259   04/25/17 0015  azithromycin (ZITHROMAX) 500 mg in dextrose 5 % 250 mL IVPB  Status:  Discontinued     500 mg 250 mL/hr over 60 Minutes Intravenous Daily at bedtime 04/25/17 0009 04/29/17 1100        Objective: Vitals:   05/08/17 1319 05/08/17 1402 05/08/17 1432 05/08/17 1511  BP: (!) 117/51 136/61 119/67 (!) 116/54  Pulse: 99 92 84 85  Resp: (!) 22 20 (!) 22 (!) 21  Temp: 97.9 F (36.6 C) 98.4 F (36.9 C) 97.7 F (36.5 C) 97.9 F (36.6 C)  TempSrc: Axillary Axillary Axillary Axillary  SpO2: 95% 96% (!) 78% 95%  Weight:      Height:        Intake/Output Summary (Last 24 hours) at 05/08/2017 1723 Last data filed at 05/08/2017 1514 Gross per 24 hour  Intake 1840 ml  Output 4000 ml  Net -2160 ml   Filed Weights   05/05/17 0441 05/07/17 0500 05/08/17 0516  Weight: 82.1 kg (180 lb 16 oz) 80.5 kg (177 lb 7.5 oz) 81.6 kg (180 lb)    Examination:  General: Ill looking, NAD  Cardiovascular: RRR S1S2  Respiratory: Improve in aeration, rhonchi at the left base Abdominal: Soft, NT, ND Extremities: UE edema resolved, LE edema improved now 1+ pitting   Data Reviewed: I have personally reviewed following labs and imaging studies  CBC: Recent Labs  Lab 05/04/17 0532 05/05/17 0333 05/06/17 0316 05/07/17 0411 05/08/17 0422  WBC 13.1* 16.5* 16.2* 11.9* 14.7*  NEUTROABS 11.6* 14.7* 14.3 10.7* 13.3*  HGB 11.6* 11.8* 11.7* 11.0* 11.2*  HCT 36.9* 38.2* 37.0* 35.9* 36.1*  MCV 94.9 96.2 94.6 94.5 96.3  PLT 225 235 244 202 628   Basic Metabolic Panel: Recent Labs  Lab 05/03/17 0241 05/04/17 0532 05/05/17 0333 05/06/17 0316 05/07/17 0411 05/08/17 0422  NA 136 135 136 137  --  139  K 4.0 4.1 4.5 4.1  --  3.6  CL 103  102 103 102  --  98*  CO2 26 29 26 28   --  35*  GLUCOSE 116* 153* 131* 178*  --  139*  BUN 17 16 14 19   --  18  CREATININE 0.51* 0.43* 0.40* 0.38*  --  0.36*  CALCIUM 7.8* 7.9* 7.9* 7.8*  --  8.1*  MG  --   --  1.8 1.8 1.8 1.8  PHOS  --   --  2.6 2.6 3.0 2.7   GFR: Estimated Creatinine Clearance: 82.4 mL/min (A) (by C-G formula based on SCr of 0.36 mg/dL (L)). Liver Function Tests: Recent Labs  Lab 05/05/17 0333 05/06/17 0316  AST  --  16  ALT  --  32  ALKPHOS  --  184*  BILITOT  --  0.6  PROT  --  4.8*  ALBUMIN 1.8* 1.7*   No results for input(s): LIPASE, AMYLASE in the last 168 hours. No results for input(s): AMMONIA in the last 168 hours. Coagulation Profile: Recent Labs  Lab 05/08/17 0422  INR 1.28   Cardiac Enzymes: No results for input(s): CKTOTAL, CKMB, CKMBINDEX, TROPONINI in the last 168 hours. BNP (last 3 results) No results for input(s): PROBNP in the last 8760 hours. HbA1C: No results for input(s): HGBA1C in the last 72 hours. CBG: Recent Labs  Lab 05/07/17 1648 05/07/17 2207 05/08/17 0724 05/08/17 1327 05/08/17 1640  GLUCAP 129* 153* 114* 124* 172*   Lipid Profile: No results for input(s): CHOL, HDL, LDLCALC, TRIG, CHOLHDL, LDLDIRECT in the last 72 hours. Thyroid Function Tests: No results for input(s): TSH, T4TOTAL, FREET4, T3FREE, THYROIDAB in the last 72 hours. Anemia Panel: No results for input(s): VITAMINB12, FOLATE, FERRITIN, TIBC, IRON, RETICCTPCT in the last 72 hours. Sepsis Labs: No results for input(s): PROCALCITON, LATICACIDVEN in the last 168 hours.  Recent Results (from the past 240 hour(s))  Culture, body fluid-bottle     Status: None   Collection Time: 04/30/17 11:14 AM  Result Value Ref Range Status   Specimen Description FLUID PLEURAL LEFT  Final   Special Requests NONE  Final   Culture   Final    NO GROWTH 5 DAYS Performed at [facility], [address], [place], [place] [address]    Report Status 05/05/2017 FINAL   Final  Gram stain     Status: None   Collection Time: 04/30/17 11:14 AM  Result Value Ref Range Status   Specimen Description FLUID PLEURAL LEFT  Final   Special Requests NONE  Final   Gram Stain   Final    ABUNDANT WBC PRESENT, PREDOMINANTLY PMN NO ORGANISMS SEEN Performed at [facility], [place] [address] [place], [place] [address]    Report Status 04/30/2017 FINAL  Final  Culture, body fluid-bottle     Status: None (Preliminary result)   Collection Time: 05/06/17  2:58 PM  Result Value Ref Range Status   Specimen Description PLEURAL LEFT  Final   Special Requests NONE  Final   Culture   Final    NO GROWTH 2 DAYS Performed at [facility] [address] [place], [place] [address]    Report Status PENDING  Incomplete  Gram stain     Status: None   Collection Time: 05/06/17  2:58 PM  Result Value Ref Range Status   Specimen Description PLEURAL LEFT  Final   Special Requests NONE  Final   Gram Stain   Final    MODERATE WBC PRESENT, PREDOMINANTLY PMN NO ORGANISMS SEEN Performed at [facility], [place] [address] [place],  [address]    Report Status 05/06/2017 FINAL  Final      Radiology Studies: Ct Chest Wo Contrast  Result Date: 05/06/2017 CLINICAL DATA:  LEFT empyema, stage IV LEFT lung cancer diabetes mellitus EXAM: CT CHEST WITHOUT CONTRAST TECHNIQUE: Multidetector CT imaging of the chest was performed following the standard protocol without IV contrast. Sagittal and coronal MPR images reconstructed from axial data set. COMPARISON:  04/30/2017 ; chest radiographs 05/06/2017, 05/04/2017 and earlier FINDINGS: Cardiovascular: Atherosclerotic calcifications aorta and proximal great vessels. Small pericardial effusion. Aorta normal caliber. Mediastinum/Nodes: Base of cervical region unremarkable. Proximal thoracic esophagus normal  appearance with esophagus contiguous with tumor from LEFT lung with which invades the mediastinum. No esophageal dilatation. No  definite thoracic adenopathy, though LEFT hilar assessment is severely limited. Lungs/Pleura: Complete collapse of the LEFT lung since prior exam with again identified large LEFT hydropneumothorax. Associated depression of the LEFT diaphragm. Abnormal soft tissue at the medial RIGHT lung extending from upper lobe into mediastinum, abutting the esophagus, LEFT pulmonary artery, and descending thoracic aorta. Abrupt cut off of the LEFT mainstem bronchus by tumor. Small to moderate RIGHT pleural effusion with compressive atelectasis of the RIGHT lower lobe. Underlying emphysematous changes. Scattered respiratory motion artifacts. Central peribronchial thickening. No definite pulmonary mass/nodule or acute infiltrate. Upper Abdomen: Dependent gallstones in gallbladder. Mild collecting system dilatation at upper pole RIGHT kidney. Atrophic pancreas. Remaining visualized upper abdomen unremarkable. Musculoskeletal: No acute osseous findings IMPRESSION: Large LEFT hydropneumothorax with depression of LEFT diaphragm as noted on prior chest radiographs. Probable large tumor mass at the central LEFT lung invading the mediastinum and abutting the esophagus, descending thoracic aorta, and LEFT pulmonary artery, with associated with abrupt cut off of the LEFT mainstem bronchus and complete collapse of the LEFT lung progressive since prior exam. Small pericardial effusion. Increased RIGHT pleural effusion and compressive atelectasis of the posterior RIGHT lung since prior exam. Question RIGHT hydronephrosis. Cholelithiasis. Aortic Atherosclerosis (ICD10-I70.0). Emphysema (ICD10-J43.9). Electronically Signed   By: Lavonia Dana M.D.   On: 05/06/2017 22:01   Ct Image Guided Drainage By Percutaneous Catheter  Result Date: 05/08/2017 INDICATION: History of squamous carcinoma of the left lung with drowned left lung, hydropneumothorax and evidence of empyema by analysis of previous fluid withdrawn from the left pleural space. EXAM:  CT-GUIDED PLACEMENT OF LEFT PLEURAL DRAINAGE CATHETER MEDICATIONS: The patient is currently admitted to the hospital and receiving intravenous antibiotics. The antibiotics were administered within an appropriate time frame prior to the initiation of the procedure. ANESTHESIA/SEDATION: Fentanyl 50 mcg IV; Versed 0.5 mg IV Moderate Sedation Time:  21 minutes. The patient was continuously monitored during the procedure by the interventional radiology nurse under my direct supervision. COMPLICATIONS: None immediate. PROCEDURE: Informed written consent was obtained from the patient after a thorough discussion of the procedural risks, benefits and alternatives. All questions were addressed. Maximal Sterile Barrier Technique was utilized including caps, mask, sterile gowns, sterile gloves, sterile drape, hand hygiene and skin antiseptic. A timeout was performed prior to the initiation of the procedure. From a left lateral approach, an 18 gauge trocar needle was advanced into the left pleural space. A guidewire was advanced through the needle. The percutaneous tract was dilated and a 12 French pigtail drainage catheter advanced. Catheter positioning was confirmed by CT. The catheter was connected to a [place] Pleur-Evac device. The catheter was secured at the skin with a Prolene retention suture and StatLock device. FINDINGS: Large left pleural effusion with component of hydropneumothorax and completely drowned left lung again noted. There is mass-effect on the mediastinum which is shifted to the right. There was immediate return of amber colored and slightly turbid fluid. After placement of the pigtail drainage catheter, there was abundant return of fluid. IMPRESSION: Placement of 12 French pigtail drainage catheter into the left pleural space to treat empyema and drowned lung. Electronically Signed   By: Aletta Edouard M.D.   On: 05/08/2017 16:17      Scheduled Meds: . amiodarone  400 mg Oral BID  . budesonide  (PULMICORT) nebulizer solution  0.25 mg Nebulization BID  . chlorhexidine  15 mL Mouth Rinse  BID  . enoxaparin (LOVENOX) injection  40 mg Subcutaneous QHS  . feeding supplement  1 Container Oral BID BM  . fentaNYL      . furosemide  20 mg Intravenous Daily  . Gerhardt's butt cream   Topical TID  . insulin aspart  0-9 Units Subcutaneous TID WC  . ipratropium  0.5 mg Nebulization Q6H  . levalbuterol  0.63 mg Nebulization Q6H  . mouth rinse  15 mL Mouth Rinse q12n4p  . metoprolol tartrate  25 mg Oral BID  . midazolam       Continuous Infusions: . sodium chloride 50 mL/hr at 05/08/17 0735  . cefTRIAXone (ROCEPHIN)  IV Stopped (05/08/17 1532)     LOS: 13 days    Time spent: Total of  35 minutes spent with pt, greater than 50% of which was spent in discussion of  treatment, counseling and coordination of care    Chipper Oman, MD Pager: Text Page via [email or web address]   If 7PM-7AM, please contact night-coverage [email or web address] 05/08/2017, 5:23 PM

## 2017-05-08 NOTE — Procedures (Signed)
Interventional Radiology Procedure Note  Procedure: CT guided left chest thoracostomy tube placement  Complications: None  Estimated Blood Loss: < 10 mL  Findings: 12 Fr pigtail drain placed in left pleural space and attached to [employer].  Return of amber colored fluid.  Venetia Night. Kathlene Cote, M.D Pager:  [phone]

## 2017-05-08 NOTE — Progress Notes (Signed)
Notified by central telemetry of pt sats at 79%. Increased high flow nasal cannula to 15L and sats increased to 82%. RN assessed chest tube and no issues were noted.  Placed pt back on NRB, notified respiratory therapy.  At the advice of respiratory therapist pt was repositioned in bed, keeping off of left side, and sats increased to 95% on NRB. Will report to pt RN and will continue to monitor. Stacey Drain

## 2017-05-08 NOTE — Progress Notes (Signed)
   05/08/17 1200  PT Visit Information  Last PT Received On 05/08/17  Reason Eval/Treat Not Completed Patient at procedure or test/unavailable

## 2017-05-09 ENCOUNTER — Inpatient Hospital Stay (HOSPITAL_COMMUNITY): Payer: Medicare Other

## 2017-05-09 LAB — COMPREHENSIVE METABOLIC PANEL
ALT: 21 U/L (ref 17–63)
AST: 20 U/L (ref 15–41)
Albumin: 1.6 g/dL — ABNORMAL LOW (ref 3.5–5.0)
Alkaline Phosphatase: 94 U/L (ref 38–126)
Anion gap: 6 (ref 5–15)
BILIRUBIN TOTAL: 0.6 mg/dL (ref 0.3–1.2)
BUN: 17 mg/dL (ref 6–20)
CHLORIDE: 95 mmol/L — AB (ref 101–111)
CO2: 37 mmol/L — ABNORMAL HIGH (ref 22–32)
CREATININE: 0.4 mg/dL — AB (ref 0.61–1.24)
Calcium: 7.5 mg/dL — ABNORMAL LOW (ref 8.9–10.3)
Glucose, Bld: 202 mg/dL — ABNORMAL HIGH (ref 65–99)
POTASSIUM: 3.2 mmol/L — AB (ref 3.5–5.1)
Sodium: 138 mmol/L (ref 135–145)
TOTAL PROTEIN: 4.2 g/dL — AB (ref 6.5–8.1)

## 2017-05-09 LAB — CBC WITH DIFFERENTIAL/PLATELET
Basophils Absolute: 0 10*3/uL (ref 0.0–0.1)
Basophils Relative: 0 %
EOS ABS: 0 10*3/uL (ref 0.0–0.7)
EOS PCT: 0 %
HCT: 34.8 % — ABNORMAL LOW (ref 39.0–52.0)
Hemoglobin: 10.8 g/dL — ABNORMAL LOW (ref 13.0–17.0)
LYMPHS ABS: 0.8 10*3/uL (ref 0.7–4.0)
LYMPHS PCT: 9 %
MCH: 29.6 pg (ref 26.0–34.0)
MCHC: 31 g/dL (ref 30.0–36.0)
MCV: 95.3 fL (ref 78.0–100.0)
MONO ABS: 0.2 10*3/uL (ref 0.1–1.0)
MONOS PCT: 2 %
Neutro Abs: 7.6 10*3/uL (ref 1.7–7.7)
Neutrophils Relative %: 89 %
PLATELETS: 152 10*3/uL (ref 150–400)
RBC: 3.65 MIL/uL — ABNORMAL LOW (ref 4.22–5.81)
RDW: 13.6 % (ref 11.5–15.5)
WBC: 8.6 10*3/uL (ref 4.0–10.5)

## 2017-05-09 LAB — GLUCOSE, CAPILLARY
GLUCOSE-CAPILLARY: 165 mg/dL — AB (ref 65–99)
GLUCOSE-CAPILLARY: 161 mg/dL — AB (ref 65–99)
Glucose-Capillary: 200 mg/dL — ABNORMAL HIGH (ref 65–99)
Glucose-Capillary: 98 mg/dL (ref 65–99)

## 2017-05-09 LAB — MAGNESIUM: MAGNESIUM: 1.7 mg/dL (ref 1.7–2.4)

## 2017-05-09 LAB — PSA: Prostatic Specific Antigen: 3.87 ng/mL (ref 0.00–4.00)

## 2017-05-09 MED ORDER — ALBUMIN HUMAN 25 % IV SOLN
12.5000 g | Freq: Once | INTRAVENOUS | Status: AC
  Administered 2017-05-09: 12.5 g via INTRAVENOUS
  Filled 2017-05-09: qty 50

## 2017-05-09 MED ORDER — ENOXAPARIN SODIUM 40 MG/0.4ML ~~LOC~~ SOLN
40.0000 mg | Freq: Every day | SUBCUTANEOUS | Status: AC
  Administered 2017-05-09 – 2017-05-16 (×8): 40 mg via SUBCUTANEOUS
  Filled 2017-05-09 (×8): qty 0.4

## 2017-05-09 NOTE — Progress Notes (Signed)
Patient ID: Maurice Tyler, male   DOB: 06/02/1940, 77 y.o.   MRN: [id number]    Referring Physician(s): Dr. Brand Males  Supervising Physician: Jacqulynn Cadet  Patient Status: [facility] - In-pt  Chief Complaint: empyema  Subjective: Patient unsure that he feels any better after drain placed, maybe only slightly better.    Allergies: Patient has no known allergies.  Medications: Prior to Admission medications   Medication Sig Start Date End Date Taking? Authorizing Provider  acetaminophen (TYLENOL) 500 MG tablet Take 1,000 mg by mouth every 6 (six) hours as needed for moderate pain.   Yes [provider]  albuterol (PROVENTIL HFA;VENTOLIN HFA) 108 (90 Base) MCG/ACT inhaler Inhale 1 puff every 6 (six) hours as needed into the lungs for wheezing or shortness of breath.   Yes [provider]  loperamide (IMODIUM A-D) 2 MG tablet Take 2 mg by mouth 3 (three) times daily as needed for diarrhea or loose stools.   Yes [provider]  metFORMIN (GLUCOPHAGE) 500 MG tablet Take 500 mg 2 (two) times daily with a meal by mouth.    Yes [provider]  methylPREDNISolone (MEDROL DOSEPAK) 4 MG TBPK tablet Take per package instructions Patient taking differently: as directed. Take 6 tablets on Day 1, Take 5 tablets on Day 2, Take 4 tablets on Day 3, Take 3 tablets on Day 4, Take 2 tablets on Day 5 and take 1 tablet on Day 6. 04/24/17  Yes Curt Bears, MD  prochlorperazine (COMPAZINE) 10 MG tablet Take 1 tablet (10 mg total) by mouth every 6 (six) hours as needed for nausea or vomiting. 04/24/17  Yes Kyung Rudd, MD  ACCU-CHEK FASTCLIX LANCETS [place]  03/19/17   [provider]  ACCU-CHEK GUIDE test strip USE TO CHECK BLOOD SUGAR BID 03/23/17   [provider]  cetirizine (ZYRTEC) 10 MG tablet Take 10 mg daily by mouth. ALLERTEC    [provider]  nystatin (MYCOSTATIN) 100000 UNIT/ML suspension SWISH AND SWALLOW 5 MLS PO  AC AND  QHS FOR 7 DAYS 03/31/17   [provider]    Vital Signs: BP 110/67 (BP Location: Right Arm)   Pulse 76   Temp 98.7 F (37.1 C) (Axillary)   Resp 20   Ht 5\' 11"  (1.803 m)   Wt 173 lb 3.2 oz (78.6 kg)   SpO2 (!) 88%   BMI 24.16 kg/m   Physical Exam: Chest: drain with 3.3L of output yesterday.  Serous.  Chest drain site is c/d/i  Imaging: Dg Chest 1 View  Result Date: 05/06/2017 CLINICAL DATA:  Status post left thoracentesis EXAM: CHEST 1 VIEW COMPARISON:  05/04/2017 FINDINGS: Stable hydropneumothorax is noted on the left. Considerable consolidated lung is again identified and stable from previous exams. The right lung is clear. Cardiac shadow is stable. No bony abnormality is seen. IMPRESSION: Stable left hydropneumothorax. Electronically Signed   By: Inez Catalina M.D.   On: 05/06/2017 15:25   Ct Chest Wo Contrast  Result Date: 05/06/2017 CLINICAL DATA:  LEFT empyema, stage IV LEFT lung cancer diabetes mellitus EXAM: CT CHEST WITHOUT CONTRAST TECHNIQUE: Multidetector CT imaging of the chest was performed following the standard protocol without IV contrast. Sagittal and coronal MPR images reconstructed from axial data set. COMPARISON:  04/30/2017 ; chest radiographs 05/06/2017, 05/04/2017 and earlier FINDINGS: Cardiovascular: Atherosclerotic calcifications aorta and proximal great vessels. Small pericardial effusion. Aorta normal caliber. Mediastinum/Nodes: Base of cervical region unremarkable. Proximal thoracic esophagus normal appearance with esophagus contiguous with tumor from  LEFT lung with which invades the mediastinum. No esophageal dilatation. No definite thoracic adenopathy, though LEFT hilar assessment is severely limited. Lungs/Pleura: Complete collapse of the LEFT lung since prior exam with again identified large LEFT hydropneumothorax. Associated depression of the LEFT diaphragm. Abnormal soft tissue at the medial RIGHT lung extending from upper lobe into mediastinum,  abutting the esophagus, LEFT pulmonary artery, and descending thoracic aorta. Abrupt cut off of the LEFT mainstem bronchus by tumor. Small to moderate RIGHT pleural effusion with compressive atelectasis of the RIGHT lower lobe. Underlying emphysematous changes. Scattered respiratory motion artifacts. Central peribronchial thickening. No definite pulmonary mass/nodule or acute infiltrate. Upper Abdomen: Dependent gallstones in gallbladder. Mild collecting system dilatation at upper pole RIGHT kidney. Atrophic pancreas. Remaining visualized upper abdomen unremarkable. Musculoskeletal: No acute osseous findings IMPRESSION: Large LEFT hydropneumothorax with depression of LEFT diaphragm as noted on prior chest radiographs. Probable large tumor mass at the central LEFT lung invading the mediastinum and abutting the esophagus, descending thoracic aorta, and LEFT pulmonary artery, with associated with abrupt cut off of the LEFT mainstem bronchus and complete collapse of the LEFT lung progressive since prior exam. Small pericardial effusion. Increased RIGHT pleural effusion and compressive atelectasis of the posterior RIGHT lung since prior exam. Question RIGHT hydronephrosis. Cholelithiasis. Aortic Atherosclerosis (ICD10-I70.0). Emphysema (ICD10-J43.9). Electronically Signed   By: Lavonia Dana M.D.   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 On chemotherapy. Now presents with shortness of breath and productive cough. EXAM: PORTABLE CHEST 1 VIEW COMPARISON:  CT chest 05/06/2017.  Chest 05/06/2017. FINDINGS: Large left hydropneumothorax is again demonstrated. There is probable collapse of most to the remaining left lung. Changes could represent malignant effusion or empyema. Since the previous study, there is interval placement of a left chest tube. No significant improvement of aeration of the left lung. Mild  cardiac enlargement. Small right pleural effusion. No consolidation on the right. Calcification of the aorta. IMPRESSION: Persistent large left hydropneumothorax with left chest tube in place. Electronically Signed   By: Lucienne Capers M.D.   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 EXAM: CT-GUIDED PLACEMENT OF LEFT PLEURAL DRAINAGE CATHETER MEDICATIONS: The patient is currently admitted to the hospital and receiving intravenous antibiotics. The antibiotics were administered within an appropriate time frame prior to the initiation of the procedure. ANESTHESIA/SEDATION: Fentanyl 50 mcg IV; Versed 0.5 mg IV Moderate Sedation Time:  21 minutes. The patient was continuously monitored during the procedure by the interventional radiology nurse under my direct supervision. COMPLICATIONS: None immediate. PROCEDURE: Informed written consent was obtained from the patient after a thorough discussion of the procedural risks, benefits and alternatives. All questions were addressed. Maximal Sterile Barrier Technique was utilized including caps, mask, sterile gowns, sterile gloves, sterile drape, hand hygiene and skin antiseptic. A timeout was performed prior to the initiation of the procedure. From a left lateral approach, an 18 gauge trocar needle was advanced into the left pleural space. A guidewire was advanced through the needle. The percutaneous tract was dilated and a 12 French pigtail drainage catheter advanced. Catheter positioning was confirmed by CT. The catheter was connected to a [place] Pleur-Evac device. The catheter  was secured at the skin with a Prolene retention suture and StatLock device. FINDINGS: Large left pleural effusion with component of hydropneumothorax  and completely drowned left lung again noted. There is mass-effect on the mediastinum which is shifted to the right. There was immediate return of amber colored and slightly turbid fluid. After placement of the pigtail drainage catheter, there was abundant return of fluid. IMPRESSION: Placement of 12 French pigtail drainage catheter into the left pleural space to treat empyema and drowned lung. Electronically Signed   By: Aletta Edouard M.D.   On: 05/08/2017 16:17   US Thoracentesis Asp Pleural Space W/img Guide  Result Date: 05/06/2017 INDICATION: Patient with stage IV lung cancer with hydropneumothorax status post thoracentesis x2. Persistent hypoxia and recurrence of pleural fluid in free space. Request is made for diagnostic therapeutic thoracentesis. EXAM: ULTRASOUND GUIDED DIAGNOSTIC AND THERAPEUTIC THORACENTESIS MEDICATIONS: 1% lidocaine COMPLICATIONS: None immediate. PROCEDURE: An ultrasound guided thoracentesis was thoroughly discussed with the patient and questions answered. The benefits, risks, alternatives and complications were also discussed. The patient understands and wishes to proceed with the procedure. Written consent was obtained. Ultrasound was performed to localize and mark an adequate pocket of fluid in the left chest. The area was then prepped and draped in the normal sterile fashion. 1% Lidocaine was used for local anesthesia. Under ultrasound guidance a Safe-T-Centesis catheter was introduced. Thoracentesis was performed. The catheter was removed and a dressing applied. FINDINGS: A total of approximately 0.5 L of turbid yellow, brown fluid was removed. Samples were sent to the laboratory as requested by the clinical team. IMPRESSION: Successful ultrasound guided left thoracentesis yielding 0.5 L of pleural fluid. The fluid was more complex appearing. After reviewing his imaging with Dr. Annamaria Boots, it was felt that only remove about 500 cc of fluid as if he were to need a drained we still  would like fluid to be present. Samples were sent to the lab determine whether he will eventually need a drain or not. Read by: Saverio Danker, PA-C Electronically Signed   By: Jerilynn Mages.  Shick M.D.   On: 05/06/2017 16:13    Labs:  CBC: Recent Labs    05/06/17 0316 05/07/17 0411 05/08/17 0422 05/09/17 0730  WBC 16.2* 11.9* 14.7* 8.6  HGB 11.7* 11.0* 11.2* 10.8*  HCT 37.0* 35.9* 36.1* 34.8*  PLT 244 202 193 152    COAGS: Recent Labs    05/08/17 0422  INR 1.28    BMP: Recent Labs    05/05/17 0333 05/06/17 0316 05/08/17 0422 05/09/17 0730  NA 136 137 139 138  K 4.5 4.1 3.6 3.2*  CL 103 102 98* 95*  CO2 26 28 35* 37*  GLUCOSE 131* 178* 139* 202*  BUN 14 19 18 17   CALCIUM 7.9* 7.8* 8.1* 7.5*  CREATININE 0.40* 0.38* 0.36* 0.40*  GFRNONAA >60 >60 >60 >60  GFRAA >60 >60 >60 >60    LIVER FUNCTION TESTS: Recent Labs    04/24/17 2054 04/26/17 0311 05/05/17 0333 05/06/17 0316 05/09/17 0730  BILITOT 0.9 0.7  --  0.6 0.6  AST 26 25  --  16 20  ALT 22 22  --  32 21  ALKPHOS 132* 79  --  184* 94  PROT 7.4 5.7*  --  4.8* 4.2*  ALBUMIN 3.6 2.5* 1.8* 1.7* 1.6*    Assessment and Plan: 1. Empyema in the setting of stage 4 lung cancer, hydroPTX, trapped lung, s/p drain placement  Drain in place with 3.3L of output.  Still having  large volume output today it appears.  Watch volume loss through his drain.  Further plans/care per CCM.  Will follow.  Electronically Signed: Henreitta Cea 05/09/2017, 9:26 AM   I spent a total of 15 Minutes at the the patient's bedside AND on the patient's hospital floor or unit, greater than 50% of which was counseling/coordinating care for empyema, hydroPTX

## 2017-05-09 NOTE — Evaluation (Signed)
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   Clinical Impression  Pt admitted with above diagnosis. Pt currently with functional limitations due to the deficits listed below (see PT Problem List). +2 max assist for supine to sit and sit to stand, pt able to stand for 1 minute with RW, tolerance limited by fatigue. SaO2 83% on 11L HFNC with activity, 89% on 13L after 10 minutes rest. Instructed pt in BLE/UE exercises to be done independently. Will likely need ST-SNF.  Pt will benefit from skilled PT to increase their independence and safety with mobility to allow discharge to the venue listed below.       Follow Up Recommendations SNF    Equipment Recommendations  Wheelchair (measurements PT);Wheelchair cushion (measurements PT);Rolling walker with 5" wheels    Recommendations for Other Services       Precautions / Restrictions Precautions Precautions: Fall Precaution Comments: monitor O2, chest tube Restrictions Weight Bearing Restrictions: No      Mobility  Bed Mobility Overal bed mobility: Needs Assistance Bed Mobility: Rolling;Supine to Sit Rolling: +2 for physical assistance;Mod assist Sidelying to sit: +2 for physical assistance;Max assist;+2 for safety/equipment       General bed mobility comments: assist to raise trunk and advance BLEs, and to manage lines/tubes (pt has chest tube)  Transfers Overall transfer level: Needs assistance Equipment used: Rolling walker (2 wheeled) Transfers: Sit to/from Stand Sit to Stand: Max assist;+2 physical assistance;From elevated surface         General transfer  comment: sit to stand x 2 trials, 1st trial he was unable to clear buttocks from bed, second trial bed was raised and he achieved full standing with RW, stood for 1 minute for pericare, standing tolerance limited by fatigue, SaO2 83% on 11L HFNC with activity, SaO2 did not come up after 5 min supine rest so increased O2 to 13L and it came up to 89%; maximove for bed to chair transfer  Ambulation/Gait             General Gait Details: unable  Stairs            Wheelchair Mobility    Modified Rankin (Stroke Patients Only)       Balance Overall balance assessment: Needs assistance Sitting-balance support: Feet supported Sitting balance-Leahy Scale: Fair       Standing balance-Leahy Scale: Poor                               Pertinent Vitals/Pain Pain Assessment: No/denies pain    Home Living Family/patient expects to be discharged to:: Private residence Living Arrangements: Spouse/significant other Available Help at Discharge: Family;Available 24 hours/day   Home Access: Stairs to enter Entrance Stairs-Rails: Chemical engineer of Steps: 4 Home Layout: One level Home Equipment: None      Prior Function Level of Independence: Independent               Hand Dominance        Extremity/Trunk Assessment   Upper Extremity Assessment Upper Extremity Assessment: Generalized weakness    Lower Extremity Assessment Lower Extremity Assessment: Generalized weakness(knee ext 3/5 B,  ankle PF/DF 3/5)       Communication   Communication: HOH  Cognition Arousal/Alertness: Awake/alert Behavior During Therapy: WFL for tasks assessed/performed Overall Cognitive Status: Within Functional Limits for tasks assessed                                        General Comments      Exercises General Exercises - Lower Extremity Ankle Circles/Pumps: AROM;Both;10 reps;Supine Short Arc Quad: AROM;Both;5 reps;Supine Shoulder  Exercises Shoulder Flexion: AROM;5 reps;Both;Seated   Assessment/Plan    PT Assessment Patient needs continued PT services  PT Problem List Decreased balance;Decreased mobility;Decreased activity tolerance;Decreased strength;Decreased knowledge of use of DME;Cardiopulmonary status limiting activity       PT Treatment Interventions Gait training;Functional mobility training;Therapeutic activities;Therapeutic exercise;Balance training;Patient/family education    PT Goals (Current goals can be found in the Care Plan section)  Acute Rehab PT Goals Patient Stated Goal: return to independence PT Goal Formulation: With patient/family Time For Goal Achievement: 05/23/17 Potential to Achieve Goals: Good    Frequency Min 3X/week   Barriers to discharge        Co-evaluation               AM-PAC PT "6 Clicks" Daily Activity  Outcome Measure Difficulty turning over in bed (including adjusting bedclothes, sheets and blankets)?: Unable Difficulty moving from lying on back to sitting on the side of the bed? : Unable Difficulty sitting down on and standing up from a chair with arms (e.g., wheelchair, bedside commode, etc,.)?: Unable Help needed moving to and from a bed to chair (including a wheelchair)?: Total Help needed walking in hospital room?: Total Help needed climbing 3-5 steps with a railing? : Total 6 Click Score: 6    End of Session Equipment Utilized During Treatment: Oxygen Activity Tolerance: Patient limited by fatigue Patient left: in chair;with call bell/phone within reach;with family/visitor present Nurse Communication: Mobility status;Need for lift equipment PT Visit Diagnosis: Difficulty in walking, not elsewhere classified (R26.2);Muscle weakness (generalized) (M62.81)    Time: 1594-5859 PT Time Calculation (min) (ACUTE ONLY): 54 min   Charges:   PT Evaluation $PT Eval Moderate Complexity: 1 Mod PT Treatments $Therapeutic Exercise: 8-22 mins $Therapeutic  Activity: 23-37 mins   PT G Codes:        Philomena Doheny 05/09/2017, 10:23 AM [phone]

## 2017-05-09 NOTE — Progress Notes (Deleted)
PT Cancellation Note  Patient Details Name: Maurice Tyler MRN: [id number] DOB: Nov 23, 1940   Cancelled Treatment:    Reason Eval/Treat Not Completed: Medical issues which prohibited therapy(pt in afib, HR 106-160s at rest. Will follow. )   Philomena Doheny 05/09/2017, 9:13 AM [phone]

## 2017-05-09 NOTE — Progress Notes (Signed)
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 Patient being currently managed for multiofocal pneumonia (streptococcal) and left-sided pleural effusion. Patient underwent placement of left pigtail on 05/08/16 with large amount of drainage.   Subjective: Patient not sure if he feels better or not, although he looks better, sitting up in chair, Pleur-evac with high output.   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)   Diabetes mellitus type 2 in nonobese (HCC)   Acute respiratory failure with hypoxia and hypercapnia (HCC)   Severe sepsis (HCC)   Malnutrition of moderate degree   Pressure injury of skin   Empyema lung (HCC)   New onset a-fib (HCC)   AKI (acute kidney injury) ([place])   Non-small cell cancer of left lung (HCC)   Goals of care, counseling/discussion   Palliative care encounter   Streptococcal pneumonia ([place])   Malignant neoplasm of left lung ([place])  Acute respiratory failure with hypoxia and hypercarbia / Sepsis secondary to streptococcal multifocal pneumonia / Empyema w +/- malignant effusion  - CXR on admission showed left perihilar soft tissue mass consistent with known malignancy. - Pt is status post thoracentesis 12/23 - 1.4 L fluid removed; thoracentesis 12/27 - 2.3 L fluid, 05/06/17 0.5L removed fluid analysis c/w bacteria empyema - s/p Pigtail placement 05/08/16 by IR - high output on pleur-evac   - Blood cx negative  - Pleural fluid cx grew strep viridans  - PCCM will re-evaluate on Monday  - Per  ID recommendation continue Rocephin while inpatient, upon discharge can change to oral formulation to complete 28 days given empyema    Leukocytosis: due to above - WBC normalized   Edema - continues to improve   - Hypoproteinemia, 3rd spacing. - Albumin given x 3  - Continue lasix IV 20 mg - remains with good urine output  - Monitor Cr while on Lasix  - D/c IVF  Metastatic stage IV lung cancer - Follows with Dr. [name] - On radio and chemotherapy  - Will hold next chemo for now  Afib with RVR - stable  - CHA2DS2-VASc Score 2 -Continue amiodarone -Continue metoprolol - cards saw, recommended against anticoagulation given new afib and significant commorbidities (see 12/27 cards c/s note)  AKI - Resolved  -Secondary to sepsis - Continue to monitor   Diabetes type 2 without complications- Stable  -SSI  - Monitor CBG's   Moderate protein calorie malnutrition -In the setting of chronic illness -Follow nutrition   Skin pressure injury, stage 1(left armpit) / Stage 2 sacral ulcer - Wound care consulted - Care per RN  Urinary retention  - Unclear etiology, ? acute illness, med list reviewed  - Keep foley for now, may try void trials in 2-3 days  - Normal PSA  - Renal US pending   DVT prophylaxis: Lovenox  Code Status: Full Code  Family Communication: None at bedside  Disposition Plan: SNF when medically stable    Consultants:   IR  PCCM  ID   Palliative   Procedures:   Thoracentesis 12/23 and 12/27  Antimicrobials: Anti-infectives (From admission, onward)   Start     Dose/Rate Route Frequency Ordered Stop   04/29/17 1200  cefTRIAXone (ROCEPHIN) 2 g in dextrose 5 % 50 mL IVPB     2 g 100 mL/hr over 30 Minutes Intravenous Every 24 hours 04/29/17 1100     04/25/17 1200  vancomycin (VANCOCIN) IVPB 750 mg/150 ml premix  Status:  Discontinued     750 mg 150 mL/hr over 60 Minutes Intravenous Every 12 hours 04/25/17 0835 04/27/17 1406    04/25/17 1000  ceFEPIme (MAXIPIME) 1 g in dextrose 5 % 50 mL IVPB  Status:  Discontinued     1 g 100 mL/hr over 30 Minutes Intravenous Every 8 hours 04/25/17 0835 04/29/17 1100   04/25/17 0015  ceFEPIme (MAXIPIME) 2 g in dextrose 5 % 50 mL IVPB     2 g 100 mL/hr over 30 Minutes Intravenous  Once 04/25/17 0001 04/25/17 0119   04/25/17 0015  vancomycin (VANCOCIN) IVPB 1000 mg/200 mL premix     1,000 mg 200 mL/hr over 60 Minutes Intravenous  Once 04/25/17 0001 04/25/17 0259   04/25/17 0015  azithromycin (ZITHROMAX) 500 mg in dextrose 5 % 250 mL IVPB  Status:  Discontinued     500 mg 250 mL/hr over 60 Minutes Intravenous Daily at bedtime 04/25/17 0009 04/29/17 1100        Objective: Vitals:   05/09/17 0442 05/09/17 0804 05/09/17 1015 05/09/17 1326  BP: 110/67   (!) 113/57  Pulse: 76   75  Resp: 20   20  Temp: 98.7 F (37.1 C)   98.7 F (37.1 C)  TempSrc: Axillary   Oral  SpO2: 91% (!) 88% (!) 83% 96%  Weight: 78.6 kg (173 lb 3.2 oz)     Height:        Intake/Output Summary (Last 24 hours) at 05/09/2017 1449 Last data filed at 05/09/2017 1325 Gross per 24 hour  Intake 949.17 ml  Output 4800 ml  Net -3850.83 ml   Filed Weights   05/07/17 0500 05/08/17 0516 05/09/17 0442  Weight: 80.5 kg (177 lb 7.5 oz) 81.6 kg (180 lb) 78.6 kg (173 lb 3.2 oz)    Examination:  General: Ill looking  Cardiovascular: RRR, A7/G8 + systolic murmur  Respiratory: Air entry improved on the R, bibasilar crackles, drain in place, no air leak   Abdominal: Soft, NT, ND, bowel sounds + Extremities: B/L LE edema improving   Data Reviewed: I have personally reviewed following labs and imaging studies  CBC: Recent Labs  Lab 05/05/17 0333 05/06/17 0316 05/07/17 0411 05/08/17 0422 05/09/17 0730  WBC 16.5* 16.2* 11.9* 14.7* 8.6  NEUTROABS 14.7* 14.3 10.7* 13.3* 7.6  HGB 11.8* 11.7* 11.0* 11.2* 10.8*  HCT 38.2* 37.0* 35.9* 36.1* 34.8*  MCV 96.2 94.6 94.5 96.3 95.3  PLT 235 244 202 193 115    Basic Metabolic Panel: Recent Labs  Lab 05/04/17 0532 05/05/17 0333 05/06/17 0316 05/07/17 0411 05/08/17 0422 05/09/17 0730  NA 135 136 137  --  139 138  K 4.1 4.5 4.1  --  3.6 3.2*  CL 102 103 102  --  98* 95*  CO2 29 26 28   --  35* 37*  GLUCOSE 153* 131* 178*  --  139* 202*  BUN 16 14 19   --  18 17  CREATININE 0.43* 0.40* 0.38*  --  0.36* 0.40*  CALCIUM 7.9* 7.9* 7.8*  --  8.1* 7.5*  MG  --  1.8 1.8 1.8  1.8 1.7  PHOS  --  2.6 2.6 3.0 2.7  --    GFR: Estimated Creatinine Clearance: 82.4 mL/min (A) (by C-G formula based on SCr of 0.4 mg/dL (L)). Liver Function Tests: Recent Labs  Lab 05/05/17 0333 05/06/17 0316 05/09/17 0730  AST  --  16 20  ALT  --  32 21  ALKPHOS  --  184* 94  BILITOT  --  0.6 0.6  PROT  --  4.8* 4.2*  ALBUMIN 1.8* 1.7* 1.6*   No results for input(s): LIPASE, AMYLASE in the last 168 hours. No results for input(s): AMMONIA in the last 168 hours. Coagulation Profile: Recent Labs  Lab 05/08/17 0422  INR 1.28   Cardiac Enzymes: No results for input(s): CKTOTAL, CKMB, CKMBINDEX, TROPONINI in the last 168 hours. BNP (last 3 results) No results for input(s): PROBNP in the last 8760 hours. HbA1C: No results for input(s): HGBA1C in the last 72 hours. CBG: Recent Labs  Lab 05/08/17 1327 05/08/17 1640 05/08/17 2039 05/09/17 0738 05/09/17 1200  GLUCAP 124* 172* 131* 165* 200*   Lipid Profile: No results for input(s): CHOL, HDL, LDLCALC, TRIG, CHOLHDL, LDLDIRECT in the last 72 hours. Thyroid Function Tests: No results for input(s): TSH, T4TOTAL, FREET4, T3FREE, THYROIDAB in the last 72 hours. Anemia Panel: No results for input(s): VITAMINB12, FOLATE, FERRITIN, TIBC, IRON, RETICCTPCT in the last 72 hours. Sepsis Labs: No results for input(s): PROCALCITON, LATICACIDVEN in the last 168 hours.  Recent Results (from the past 240 hour(s))  Culture, body fluid-bottle     Status: None   Collection Time: 04/30/17 11:14 AM  Result Value Ref Range  Status   Specimen Description FLUID PLEURAL LEFT  Final   Special Requests NONE  Final   Culture   Final    NO GROWTH 5 DAYS Performed at [facility], [address], [place], [place] [address]    Report Status 05/05/2017 FINAL  Final  Gram stain     Status: None   Collection Time: 04/30/17 11:14 AM  Result Value Ref Range Status   Specimen Description FLUID PLEURAL LEFT  Final   Special Requests NONE  Final   Gram Stain   Final    ABUNDANT WBC PRESENT, PREDOMINANTLY PMN NO ORGANISMS SEEN Performed at [facility], [place] [address], [place], [place] [address]    Report Status 04/30/2017 FINAL  Final  Culture, body fluid-bottle     Status: None (Preliminary result)   Collection Time: 05/06/17  2:58 PM  Result Value Ref Range Status   Specimen Description PLEURAL LEFT  Final   Special Requests NONE  Final   Culture   Final    NO GROWTH 3 DAYS Performed at [facility] [address], [place], [place] [address]    Report Status PENDING  Incomplete  Gram stain     Status: None   Collection Time: 05/06/17  2:58 PM  Result Value Ref Range Status   Specimen Description PLEURAL LEFT  Final   Special Requests NONE  Final   Gram Stain   Final    MODERATE WBC PRESENT, PREDOMINANTLY PMN NO ORGANISMS SEEN Performed at [facility], [place] [address] [place], [place] [address]    Report Status 05/06/2017 FINAL  Final      Radiology Studies: Dg Chest Port 1 View  Result Date: 05/08/2017 CLINICAL DATA:  History of stage IV non-small cell lung cancer of the left upper lung. On chemotherapy. Now presents  with shortness of breath and productive cough. EXAM: PORTABLE CHEST 1 VIEW COMPARISON:  CT chest 05/06/2017.  Chest 05/06/2017. FINDINGS: Large left hydropneumothorax is again demonstrated. There is probable collapse of most to the remaining left lung. Changes could represent malignant effusion or empyema. Since the previous study, there is interval placement of a  left chest tube. No significant improvement of aeration of the left lung. Mild cardiac enlargement. Small right pleural effusion. No consolidation on the right. Calcification of the aorta. IMPRESSION: Persistent large left hydropneumothorax with left chest tube in place. Electronically Signed   By: Lucienne Capers M.D.   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 EXAM: CT-GUIDED PLACEMENT OF LEFT PLEURAL DRAINAGE CATHETER MEDICATIONS: The patient is currently admitted to the hospital and receiving intravenous antibiotics. The antibiotics were administered within an appropriate time frame prior to the initiation of the procedure. ANESTHESIA/SEDATION: Fentanyl 50 mcg IV; Versed 0.5 mg IV Moderate Sedation Time:  21 minutes. The patient was continuously monitored during the procedure by the interventional radiology nurse under my direct supervision. COMPLICATIONS: None immediate. PROCEDURE: Informed written consent was obtained from the patient after a thorough discussion of the procedural risks, benefits and alternatives. All questions were addressed. Maximal Sterile Barrier Technique was utilized including caps, mask, sterile gowns, sterile gloves, sterile drape, hand hygiene and skin antiseptic. A timeout was performed prior to the initiation of the procedure. From a left lateral approach, an 18 gauge trocar needle was advanced into the left pleural space. A guidewire was advanced through the needle. The percutaneous tract was dilated and a 12 French pigtail drainage catheter advanced. Catheter positioning was confirmed by CT. The catheter was connected to a [place] Pleur-Evac device. The catheter was secured at the skin with a Prolene retention suture and StatLock  device. FINDINGS: Large left pleural effusion with component of hydropneumothorax and completely drowned left lung again noted. There is mass-effect on the mediastinum which is shifted to the right. There was immediate return of amber colored and slightly turbid fluid. After placement of the pigtail drainage catheter, there was abundant return of fluid. IMPRESSION: Placement of 12 French pigtail drainage catheter into the left pleural space to treat empyema and drowned lung. Electronically Signed   By: Aletta Edouard M.D.   On: 05/08/2017 16:17     Scheduled Meds: . amiodarone  400 mg Oral BID  . budesonide (PULMICORT) nebulizer solution  0.25 mg Nebulization BID  . chlorhexidine  15 mL Mouth Rinse BID  . enoxaparin (LOVENOX) injection  40 mg Subcutaneous QHS  . feeding supplement  1 Container Oral BID BM  . furosemide  20 mg Intravenous Daily  . Gerhardt's butt cream   Topical TID  . insulin aspart  0-9 Units Subcutaneous TID WC  . ipratropium  0.5 mg Nebulization Q6H  . levalbuterol  0.63 mg Nebulization Q6H  . mouth rinse  15 mL Mouth Rinse q12n4p  . metoprolol tartrate  25 mg Oral BID   Continuous Infusions: . cefTRIAXone (ROCEPHIN)  IV Stopped (05/09/17 1336)     LOS: 14 days    Time spent: Total of  25 minutes spent with pt, greater than 50% of which was spent in discussion of  treatment, counseling and coordination of care   Chipper Oman, MD  Pager: Text Page via [email or web address]   If 7PM-7AM, please contact night-coverage [email or web address] 05/09/2017, 2:49 PM

## 2017-05-10 ENCOUNTER — Inpatient Hospital Stay (HOSPITAL_COMMUNITY): Payer: Medicare Other

## 2017-05-10 DIAGNOSIS — R339 Retention of urine, unspecified: Secondary | ICD-10-CM

## 2017-05-10 DIAGNOSIS — Z9689 Presence of other specified functional implants: Secondary | ICD-10-CM

## 2017-05-10 DIAGNOSIS — R000 Tachycardia, unspecified: Secondary | ICD-10-CM

## 2017-05-10 DIAGNOSIS — Z9889 Other specified postprocedural states: Secondary | ICD-10-CM

## 2017-05-10 LAB — CBC WITH DIFFERENTIAL/PLATELET
BASOS PCT: 0 %
Basophils Absolute: 0 10*3/uL (ref 0.0–0.1)
EOS ABS: 0 10*3/uL (ref 0.0–0.7)
EOS PCT: 0 %
HCT: 36.2 % — ABNORMAL LOW (ref 39.0–52.0)
Hemoglobin: 11 g/dL — ABNORMAL LOW (ref 13.0–17.0)
LYMPHS ABS: 0.4 10*3/uL — AB (ref 0.7–4.0)
Lymphocytes Relative: 6 %
MCH: 28.7 pg (ref 26.0–34.0)
MCHC: 30.4 g/dL (ref 30.0–36.0)
MCV: 94.5 fL (ref 78.0–100.0)
MONOS PCT: 7 %
Monocytes Absolute: 0.5 10*3/uL (ref 0.1–1.0)
NEUTROS PCT: 87 %
Neutro Abs: 5.8 10*3/uL (ref 1.7–7.7)
PLATELETS: 154 10*3/uL (ref 150–400)
RBC: 3.83 MIL/uL — ABNORMAL LOW (ref 4.22–5.81)
RDW: 13.6 % (ref 11.5–15.5)
WBC: 6.7 10*3/uL (ref 4.0–10.5)

## 2017-05-10 LAB — MAGNESIUM: MAGNESIUM: 1.7 mg/dL (ref 1.7–2.4)

## 2017-05-10 LAB — GLUCOSE, CAPILLARY
GLUCOSE-CAPILLARY: 158 mg/dL — AB (ref 65–99)
GLUCOSE-CAPILLARY: 284 mg/dL — AB (ref 65–99)
Glucose-Capillary: 107 mg/dL — ABNORMAL HIGH (ref 65–99)

## 2017-05-10 LAB — ALBUMIN: ALBUMIN: 1.8 g/dL — AB (ref 3.5–5.0)

## 2017-05-10 MED ORDER — LEVALBUTEROL HCL 1.25 MG/0.5ML IN NEBU
1.2500 mg | INHALATION_SOLUTION | Freq: Three times a day (TID) | RESPIRATORY_TRACT | Status: DC
  Administered 2017-05-11 – 2017-05-19 (×21): 1.25 mg via RESPIRATORY_TRACT
  Filled 2017-05-10 (×28): qty 0.5

## 2017-05-10 MED ORDER — IPRATROPIUM BROMIDE 0.02 % IN SOLN
0.5000 mg | Freq: Four times a day (QID) | RESPIRATORY_TRACT | Status: DC
  Administered 2017-05-10: 0.5 mg via RESPIRATORY_TRACT
  Filled 2017-05-10 (×2): qty 2.5

## 2017-05-10 MED ORDER — LEVALBUTEROL HCL 0.63 MG/3ML IN NEBU
0.6300 mg | INHALATION_SOLUTION | Freq: Four times a day (QID) | RESPIRATORY_TRACT | Status: DC
  Administered 2017-05-10: 0.63 mg via RESPIRATORY_TRACT
  Filled 2017-05-10 (×2): qty 3

## 2017-05-10 MED ORDER — POTASSIUM CHLORIDE CRYS ER 20 MEQ PO TBCR
40.0000 meq | EXTENDED_RELEASE_TABLET | ORAL | Status: AC
  Administered 2017-05-10 (×2): 40 meq via ORAL
  Filled 2017-05-10: qty 2

## 2017-05-10 MED ORDER — IPRATROPIUM BROMIDE 0.02 % IN SOLN
0.5000 mg | Freq: Three times a day (TID) | RESPIRATORY_TRACT | Status: DC
  Administered 2017-05-10 – 2017-05-19 (×22): 0.5 mg via RESPIRATORY_TRACT
  Filled 2017-05-10 (×29): qty 2.5

## 2017-05-10 MED ORDER — LEVALBUTEROL HCL 0.63 MG/3ML IN NEBU
0.6300 mg | INHALATION_SOLUTION | Freq: Three times a day (TID) | RESPIRATORY_TRACT | Status: DC
  Administered 2017-05-10: 0.63 mg via RESPIRATORY_TRACT
  Filled 2017-05-10: qty 3

## 2017-05-10 MED ORDER — POTASSIUM CHLORIDE CRYS ER 20 MEQ PO TBCR
40.0000 meq | EXTENDED_RELEASE_TABLET | ORAL | Status: DC
  Administered 2017-05-10: 40 meq via ORAL
  Filled 2017-05-10: qty 2

## 2017-05-10 MED ORDER — INSULIN ASPART 100 UNIT/ML ~~LOC~~ SOLN
4.0000 [IU] | Freq: Once | SUBCUTANEOUS | Status: AC
  Administered 2017-05-10: 4 [IU] via SUBCUTANEOUS

## 2017-05-10 MED ORDER — MAGNESIUM SULFATE 2 GM/50ML IV SOLN
2.0000 g | Freq: Once | INTRAVENOUS | Status: AC
  Administered 2017-05-10: 2 g via INTRAVENOUS
  Filled 2017-05-10: qty 50

## 2017-05-10 NOTE — Clinical Social Work Note (Signed)
Clinical Social Work Assessment  Patient Details  Name: Maurice Tyler MRN: [id number] Date of Birth: Mar 25, 1941  Date of referral:  05/10/17               Reason for consult:  Facility Placement                Permission sought to share information with:  Chartered certified accountant granted to share information::  Yes, Verbal Permission Granted  Name::     Terrick Allred  Agency::  SNF  Relationship::  spouse  Contact Information:     Housing/Transportation Living arrangements for the past 2 months:  [place] of Information:  Patient Patient Interpreter Needed:  None Criminal Activity/Legal Involvement Pertinent to Current Situation/Hospitalization:  No - Comment as needed Significant Relationships:  Spouse, Other Family Members, Adult Children Lives with:  Spouse Do you feel safe going back to the place where you live?  No Need for family participation in patient care:  No (Coment)  Care giving concerns:  CSW met with patient at bedside to discuss clinical team's recommendation for SNF. Pt agreeable to SNF placement and indicated that he discussed with spouse and they would like to be close to home. Pt understands that he cannot be cared for by spouse at home and is amenable to SNF placement.  Social Worker assessment / plan:  CSW will f/u for [facility] offers and discuss with patient. CSW will set up transport at appropriate time. CSW will continue to follow for disposition.  Employment status:  Retired Forensic scientist:  Medicare PT Recommendations:  [place] / Referral to community resources:  [place]  Patient/Family's Response to care:  Patient appreciative that CSW came to meet and discuss discharge plan. No other issues or concerns identified. No other issues or concerns identified.  Patient/Family's Understanding of and Emotional Response to Diagnosis, Current Treatment, and Prognosis:   Patient understands his health conditions and is amenable to SNF as he identified that he cannot return home at this time. Pt confirmed that he had discussion with with about SNF placement and desires to be close to home. CSW validated feelings and will f/u with patient on SNF options. Pt in agreement. No other issues or concerns identified.  Emotional Assessment Appearance:  Appears stated age Attitude/Demeanor/Rapport:  (Cooperative) Affect (typically observed):  Accepting, Appropriate Orientation:  Oriented to Situation, Oriented to  Time, Oriented to Self Alcohol / Substance use:    Psych involvement (Current and /or in the community):  No (Comment)  Discharge Needs  Concerns to be addressed:  Discharge Planning Concerns Readmission within the last 30 days:  No Current discharge risk:  Dependent with Mobility Barriers to Discharge:  No Barriers Identified   Normajean Baxter, LCSW 05/10/2017, 3:56 PM

## 2017-05-10 NOTE — Progress Notes (Signed)
CRITICAL VALUE ALERT  Critical Value:  K 2.3   Date & Time Notied:  0705 05/10/16  Provider Notified: [place] Crane   Orders Received/Actions taken: passed information to oncoming nurse

## 2017-05-10 NOTE — NC FL2 (Signed)
[place] LEVEL OF CARE SCREENING TOOL     IDENTIFICATION  Patient Name: Maurice Tyler Birthdate: 1940/05/16 Sex: male Admission Date (Current Location): 04/24/2017  [facility] and [place] Number:  Herbalist and Address:  [facility],  [place], [place]      Provider Number: [id number]  Attending Physician Name and Address:  Kayleen Memos, DO  Relative Name and Phone Number:  Loyce Klasen, spouse, [phone]    Current Level of Care: Hospital Recommended Level of Care: [place] Prior Approval Number:    Date Approved/Denied:   PASRR Number: [id number]  Discharge Plan: SNF    Current Diagnoses: Patient Active Problem List   Diagnosis Date Noted  . Malignant neoplasm of left lung ([place])   . Streptococcal pneumonia ([place])   . New onset a-fib ([place]) 04/30/2017  . AKI (acute kidney injury) ([place]) 04/30/2017  . Non-small cell cancer of left lung ([place])   . Goals of care, counseling/discussion   . Palliative care encounter   . Empyema lung ([place]) 04/29/2017  . Malnutrition of moderate degree 04/28/2017  . Pressure injury of skin 04/28/2017  . Diabetes mellitus type 2 in nonobese ([place]) 04/25/2017  . Acute respiratory failure with hypoxia and hypercapnia ([place]) 04/25/2017  . Severe sepsis ([place]) 04/25/2017  . Multifocal pneumonia 04/25/2017  . Malignant neoplasm of bronchus of left upper lobe ([place]) 03/30/2017  . Stage IV squamous cell carcinoma of left lung ([place]) 03/30/2017    Orientation RESPIRATION BLADDER Height & Weight     Self, Time, Place  O2, Other (Comment)(HFNC 10 L,   Chest Tube) Continent Weight: 168 lb (76.2 kg) Height:  5\' 11"  (180.3 cm)  BEHAVIORAL SYMPTOMS/MOOD NEUROLOGICAL BOWEL NUTRITION STATUS      Incontinent Diet  AMBULATORY STATUS COMMUNICATION OF NEEDS Skin   Extensive Assist Verbally PU Stage and Appropriate Care PU Stage 1 Dressing: (PRN) PU Stage 2 Dressing: (PRN)                    Personal Care Assistance Level of Assistance  Bathing, Feeding, Dressing Bathing Assistance: Maximum assistance Feeding assistance: Maximum assistance Dressing Assistance: Maximum assistance     Functional Limitations Info  Sight, Hearing, Speech Sight Info: Impaired Hearing Info: Impaired Speech Info: Adequate    SPECIAL CARE FACTORS FREQUENCY        PT Frequency: 5x week              Contractures Contractures Info: Not present    Additional Factors Info  Code Status, Allergies, Insulin Sliding Scale Code Status Info: Full  Allergies Info: No Known Allergies   Insulin Sliding Scale Info: Insulin daily       Current Medications (05/10/2017):  This is the current hospital active medication list Current Facility-Administered Medications  Medication Dose Route Frequency Provider Last Rate Last Dose  . acetaminophen (TYLENOL) tablet 650 mg  650 mg Oral Q6H PRN Rise Patience, MD       Or  . acetaminophen (TYLENOL) suppository 650 mg  650 mg Rectal Q6H PRN Rise Patience, MD      . amiodarone (PACERONE) tablet 400 mg  400 mg Oral BID Minus Breeding, MD   400 mg at 05/10/17 0909  . budesonide (PULMICORT) nebulizer solution 0.25 mg  0.25 mg Nebulization BID Rise Patience, MD   0.25 mg at 05/10/17 0730  . cefTRIAXone (ROCEPHIN) 2 g in dextrose 5 % 50 mL IVPB  2  g Intravenous Q24H Michel Bickers, MD   Stopped at 05/10/17 1315  . chlorhexidine (PERIDEX) 0.12 % solution 15 mL  15 mL Mouth Rinse BID Elodia Florence., MD   15 mL at 05/10/17 0909  . enoxaparin (LOVENOX) injection 40 mg  40 mg Subcutaneous QHS Polly Cobia, RPH   40 mg at 05/09/17 2313  . feeding supplement (BOOST / RESOURCE BREEZE) liquid 1 Container  1 Container Oral BID BM Marene Lenz, MD   1 Container at 05/10/17 0809  . furosemide (LASIX) injection 20 mg  20 mg Intravenous Daily Patrecia Pour, Christean Grief, MD   20 mg at 05/10/17 0908  . Gerhardt's butt cream    Topical TID Robbie Lis, MD      . insulin aspart (novoLOG) injection 0-9 Units  0-9 Units Subcutaneous TID WC Rise Patience, MD   2 Units at 05/10/17 1314  . ipratropium (ATROVENT) nebulizer solution 0.5 mg  0.5 mg Nebulization QID [name], MD   0.5 mg at 05/10/17 0730  . levalbuterol (XOPENEX) nebulizer solution 0.63 mg  0.63 mg Nebulization Q2H PRN [name], MD   0.63 mg at 05/06/17 1537  . levalbuterol (XOPENEX) nebulizer solution 0.63 mg  0.63 mg Nebulization QID [name], MD   0.63 mg at 05/10/17 0730  . loperamide (IMODIUM) capsule 2 mg  2 mg Oral TID PRN Rise Patience, MD   2 mg at 05/06/17 7867  . LORazepam (ATIVAN) tablet 0.5 mg  0.5 mg Oral Q6H PRN Patrecia Pour, Christean Grief, MD   0.5 mg at 05/07/17 2102  . MEDLINE mouth rinse  15 mL Mouth Rinse q12n4p Elodia Florence., MD   15 mL at 05/10/17 1154  . metoprolol tartrate (LOPRESSOR) injection 5 mg  5 mg Intravenous Q8H PRN Marene Lenz, MD   5 mg at 05/07/17 1907  . metoprolol tartrate (LOPRESSOR) tablet 25 mg  25 mg Oral BID Marene Lenz, MD   25 mg at 05/10/17 0909  . ondansetron (ZOFRAN) tablet 4 mg  4 mg Oral Q6H PRN Rise Patience, MD       Or  . ondansetron [facility]) injection 4 mg  4 mg Intravenous Q6H PRN Rise Patience, MD      . prochlorperazine (COMPAZINE) tablet 10 mg  10 mg Oral Q6H PRN Rise Patience, MD         Discharge Medications: Please see discharge summary for a list of discharge medications.  Relevant Imaging Results:  Relevant Lab Results:   Additional Information SS#: [id number]  [place], LCSW

## 2017-05-10 NOTE — Progress Notes (Signed)
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 Patient being currently managed for multiofocal pneumonia (streptococcal) and left-sided Large hydropneumothorax with depression. Patient underwent placement of left pigtail on 05/08/16 with large amount of drainage.   Subjective: No acute events reported overnight. Hypokalemia and hypomagnesemia. Repleted. Repeat BMP and magnesium am. 550 cc dark fluid in the collector from left chest tube. Denies chest pain or dyspnea. 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)   Diabetes mellitus type 2 in nonobese (HCC)   Acute respiratory failure with hypoxia and hypercapnia (HCC)   Severe sepsis (HCC)   Malnutrition of moderate degree   Pressure injury of skin   Empyema lung (HCC)   New onset a-fib (HCC)   AKI (acute kidney injury) ([place])   Non-small cell cancer of left lung (HCC)   Goals of care, counseling/discussion   Palliative care encounter   Streptococcal pneumonia ([place])   Malignant neoplasm of left lung ([place])  Acute respiratory failure with hypoxia and hypercarbia / Sepsis secondary to streptococcal multifocal pneumonia / Empyema w +/- malignant effusion  - CXR on admission showed left perihilar soft tissue mass consistent with known malignancy. - Pt is status post thoracentesis 12/23 - 1.4 L fluid removed; thoracentesis 12/27 - 2.3 L fluid, 05/06/17 0.5L removed fluid analysis c/w bacteria empyema - s/p Pigtail placement 05/08/16 by IR - high output on  pleur-evac   - Blood cx negative  - Pleural fluid cx grew strep viridans  - PCCM will re-evaluate on Monday  - Per ID recommendation continue Rocephin while inpatient, upon discharge can change to oral formulation to complete 28 days given empyema   - daily cxr started 05/10/17 - Still requiring high O2 supplement 10 L sat 99% - Not previously on O2 supplement at home   Leukocytosis: due to above - WBC normalized   Edema - continues to improve   - Hypoproteinemia, 3rd spacing. - Albumin given x 3  - Continue lasix IV 20 mg - remains with good urine output  - Monitor Cr while on Lasix  - D/c IVF  Metastatic stage IV lung cancer - Follows with Dr. [name] - On radio and chemotherapy  - Will hold next chemo for now  Afib with RVR - stable  - rate controlled - CHA2DS2-VASc Score 2 -Continue amiodarone -Continue metoprolol - cards saw, recommended against anticoagulation given new afib and significant commorbidities (see 12/27 cards c/s note)  AKI - Resolved  -Secondary to sepsis - Continue to monitor   Diabetes type 2 without complications- Stable  -SSI  - Monitor CBG's   Moderate protein calorie malnutrition -In the setting of chronic illness -Follow nutrition   Skin pressure injury, stage 1(left armpit) / Stage 2 sacral ulcer - Wound care consulted - Care per RN  Urinary retention  - Unclear etiology - Keep foley for now, may try void trials in 2-3 days  - Normal PSA  - Renal US 05/09/17. No evidence of hydronephrosis. 2. Bilateral simple renal cysts. 3. Cholelithiasis.  DVT prophylaxis: Lovenox sq daily Code Status: Full Code  Family Communication: None at bedside  Disposition Plan: SNF when medically stable    Consultants:   IR  PCCM  ID   Palliative   Procedures:   Thoracentesis 12/23 and 12/27   Antimicrobials: Anti-infectives (From admission, onward)   Start     Dose/Rate Route Frequency Ordered Stop   04/29/17 1200   cefTRIAXone (ROCEPHIN) 2 g in dextrose 5 % 50 mL IVPB     2 g 100 mL/hr over 30 Minutes Intravenous Every 24 hours 04/29/17 1100     04/25/17 1200  vancomycin (VANCOCIN) IVPB 750 mg/150 ml premix  Status:  Discontinued     750 mg 150 mL/hr over 60 Minutes Intravenous Every 12 hours 04/25/17 0835 04/27/17 1406   04/25/17 1000  ceFEPIme (MAXIPIME) 1 g in dextrose 5 % 50 mL IVPB  Status:  Discontinued     1 g 100 mL/hr over 30 Minutes Intravenous Every 8 hours 04/25/17 0835 04/29/17 1100   04/25/17 0015  ceFEPIme (MAXIPIME) 2 g in dextrose 5 % 50 mL IVPB     2 g 100 mL/hr over 30 Minutes Intravenous  Once 04/25/17 0001 04/25/17 0119   04/25/17 0015  vancomycin (VANCOCIN) IVPB 1000 mg/200 mL premix     1,000 mg 200 mL/hr over 60 Minutes Intravenous  Once 04/25/17 0001 04/25/17 0259   04/25/17 0015  azithromycin (ZITHROMAX) 500 mg in dextrose 5 % 250 mL IVPB  Status:  Discontinued     500 mg 250 mL/hr over 60 Minutes Intravenous Daily at bedtime 04/25/17 0009 04/29/17 1100        Objective: Vitals:   05/09/17 1945 05/09/17 2054 05/10/17 0436 05/10/17 0730  BP:  (!) 121/53 (!) 120/53   Pulse:  76 71   Resp:  (!) 21 18   Temp:  97.7 F (36.5 C) 97.6 F (36.4 C)   TempSrc:  Axillary Axillary   SpO2: 90% 94% 98% 96%  Weight:   76.2 kg (168 lb)   Height:        Intake/Output Summary (Last 24 hours) at 05/10/2017 0819 Last data filed at 05/10/2017 0500 Gross per 24 hour  Intake 1180 ml  Output 2275 ml  Net -1095 ml   Filed Weights   05/08/17 0516 05/09/17 0442 05/10/17 0436  Weight: 81.6 kg (180 lb) 78.6 kg (173 lb 3.2 oz) 76.2 kg (168 lb)    Examination:  General: 49 CM WD NAD Cardiovascular: RRR, I4/P8 + systolic murmur  Respiratory: Air entry improved on the R, bibasilar crackles, drain in place, no air leak   Abdominal: Soft, NT, ND, bowel sounds + Extremities: B/L LE edema improving   Data Reviewed: I have personally reviewed following labs and imaging  studies  CBC: Recent Labs  Lab 05/06/17 0316 05/07/17 0411 05/08/17 0422 05/09/17 0730 05/10/17 0458  WBC 16.2* 11.9* 14.7* 8.6 6.7  NEUTROABS 14.3 10.7* 13.3* 7.6 5.8  HGB 11.7* 11.0* 11.2* 10.8* 11.0*  HCT 37.0* 35.9* 36.1* 34.8* 36.2*  MCV 94.6 94.5 96.3 95.3 94.5  PLT 244 202 193 152 099   Basic Metabolic Panel: Recent Labs  Lab 05/05/17 0333 05/06/17 0316 05/07/17 0411 05/08/17 0422 05/09/17 0730 05/10/17 0458  NA 136 137  --  139 138 135  K 4.5 4.1  --  3.6 3.2* 2.3*  CL 103 102  --  98* 95* 90*  CO2 26 28  --  35* 37* 39*  GLUCOSE 131* 178*  --  139* 202* 308*  BUN 14 19  --  18 17 17   CREATININE 0.40* 0.38*  --  0.36* 0.40* 0.40*  CALCIUM 7.9* 7.8*  --  8.1* 7.5* 7.4*  MG 1.8 1.8 1.8 1.8 1.7 1.7  PHOS 2.6 2.6 3.0 2.7  --   --    GFR: Estimated Creatinine Clearance: 82.4 mL/min (A) (by C-G formula based on SCr of 0.4 mg/dL (L)). Liver Function Tests: Recent Labs  Lab 05/05/17 0333 05/06/17 0316 05/09/17 0730 05/10/17 0458  AST  --  16 20  --   ALT  --  32 21  --   ALKPHOS  --  184* 94  --   BILITOT  --  0.6 0.6  --   PROT  --  4.8* 4.2*  --   ALBUMIN 1.8* 1.7* 1.6* 1.8*   No results for input(s): LIPASE, AMYLASE in the last 168 hours. No results for input(s): AMMONIA in the last 168 hours. Coagulation Profile: Recent Labs  Lab 05/08/17 0422  INR 1.28   Cardiac Enzymes: No results for input(s): CKTOTAL, CKMB, CKMBINDEX, TROPONINI in the last 168 hours. BNP (last 3 results) No results for input(s): PROBNP in the last 8760 hours. HbA1C: No results for input(s): HGBA1C in the last 72 hours. CBG: Recent Labs  Lab 05/08/17 2039 05/09/17 0738 05/09/17 1200 05/09/17 1633 05/09/17 2049  GLUCAP 131* 165* 200* 98 161*   Lipid Profile: No results for input(s): CHOL, HDL, LDLCALC, TRIG, CHOLHDL, LDLDIRECT in the last 72 hours. Thyroid Function Tests: No results for input(s): TSH, T4TOTAL, FREET4, T3FREE, THYROIDAB in the last 72 hours. Anemia  Panel: No results for input(s): VITAMINB12, FOLATE, FERRITIN, TIBC, IRON, RETICCTPCT in the last 72 hours. Sepsis Labs: No results for input(s): PROCALCITON, LATICACIDVEN in the last 168 hours.  Recent Results (from the past 240 hour(s))  Culture, body fluid-bottle     Status: None   Collection Time: 04/30/17 11:14 AM  Result Value Ref Range Status   Specimen Description FLUID PLEURAL LEFT  Final   Special Requests NONE  Final   Culture   Final    NO GROWTH 5 DAYS Performed at [facility], [address] [place], [place] [address]    Report Status 05/05/2017 FINAL  Final  Gram stain     Status: None   Collection Time: 04/30/17 11:14 AM  Result Value Ref Range Status   Specimen Description FLUID PLEURAL LEFT  Final   Special Requests NONE  Final   Gram Stain   Final    ABUNDANT WBC PRESENT, PREDOMINANTLY PMN NO ORGANISMS SEEN Performed at [facility], [place] [address], [place], [place] [address]    Report Status 04/30/2017 FINAL  Final  Culture, body fluid-bottle     Status: None (Preliminary result)   Collection Time: 05/06/17  2:58 PM  Result Value Ref Range Status   Specimen Description PLEURAL LEFT  Final   Special Requests NONE  Final   Culture   Final    NO GROWTH 3 DAYS Performed at [facility] [address] [place], [place] [address]    Report Status PENDING  Incomplete  Gram stain     Status: None   Collection Time: 05/06/17  2:58 PM  Result Value Ref Range Status   Specimen Description PLEURAL LEFT  Final   Special Requests NONE  Final   Gram Stain   Final    MODERATE WBC PRESENT, PREDOMINANTLY PMN NO ORGANISMS SEEN Performed  at [facility], [place] [address] [place], [place] [address]    Report Status 05/06/2017 FINAL  Final      Radiology Studies: US Renal  Result Date: 05/09/2017 CLINICAL DATA:  77 year old male with urinary retention EXAM: RENAL / URINARY TRACT ULTRASOUND COMPLETE COMPARISON:  None. FINDINGS: Right Kidney:  Length: 13.7 cm. Echogenicity is within normal limits. Circumscribed anechoic cystic lesions are present in the lower pole measuring up to 2.5 and 0.9 cm respectively. Findings are consistent with simple cysts. No evidence of hydronephrosis or nephrolithiasis. Left Kidney: Length: 13.2 cm. Echogenicity is within normal limits. Circumscribed anechoic simple cyst in the lower pole measures 1.5 cm. Bladder: Foley catheter in the collapsed bladder. Other: Multiple mobile echogenic foci with posterior acoustic shadowing in the gallbladder consistent with cholelithiasis. IMPRESSION: 1. No evidence of hydronephrosis. 2. Bilateral simple renal cysts. 3. Cholelithiasis. Electronically Signed   By: Jacqulynn Cadet M.D.   On: 05/09/2017 15:07   Dg Chest Port 1 View  Result Date: 05/09/2017 CLINICAL DATA:  Left pneumothorax EXAM: PORTABLE CHEST 1 VIEW COMPARISON:  Chest x-ray 05/08/2017 CT chest 05/06/2017 FINDINGS: Persistent large left hydropneumothorax with partial loculated fluid in the mid portion. Left basilar chest tube in satisfactory position. Diffuse mild right lung interstitial thickening. Small right pleural effusion. No right pneumothorax. Stable cardiomediastinal silhouette. No acute osseous abnormality. IMPRESSION: Stable large left hydropneumothorax with a partially loculated component. Left basilar chest tube in satisfactory unchanged position. 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 On chemotherapy. Now presents with shortness of breath and productive cough. EXAM: PORTABLE CHEST 1 VIEW COMPARISON:  CT chest 05/06/2017.  Chest 05/06/2017. FINDINGS: Large left hydropneumothorax is again demonstrated. There is probable collapse of most to the remaining left lung. Changes could represent malignant effusion or empyema. Since the previous study, there  is interval placement of a left chest tube. No significant improvement of aeration of the left lung. Mild cardiac enlargement. Small right pleural effusion. No consolidation on the right. Calcification of the aorta. IMPRESSION: Persistent large left hydropneumothorax with left chest tube in place. Electronically Signed   By: Lucienne Capers M.D.   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 EXAM: CT-GUIDED PLACEMENT OF LEFT PLEURAL DRAINAGE CATHETER MEDICATIONS: The patient is currently admitted to the hospital and receiving intravenous antibiotics. The antibiotics were administered within an appropriate time frame prior to the initiation of the procedure. ANESTHESIA/SEDATION: Fentanyl 50 mcg IV; Versed 0.5 mg IV Moderate Sedation Time:  21 minutes. The patient was continuously monitored during the procedure by the interventional radiology nurse under my direct supervision. COMPLICATIONS: None immediate. PROCEDURE: Informed written consent was obtained from the patient after a thorough discussion of the procedural risks, benefits and alternatives. All questions were addressed. Maximal Sterile Barrier Technique was utilized including caps, mask, sterile gowns, sterile gloves, sterile drape, hand hygiene and skin antiseptic. A timeout was performed prior to the initiation of the procedure. From a left lateral approach, an 18 gauge trocar needle was advanced into the left pleural space. A guidewire was advanced through the needle. The percutaneous tract  was dilated and a 12 French pigtail drainage catheter advanced. Catheter positioning was confirmed by CT. The catheter was connected to a [place] Pleur-Evac device. The catheter was secured at the skin with a Prolene  retention suture and StatLock device. FINDINGS: Large left pleural effusion with component of hydropneumothorax and completely drowned left lung again noted. There is mass-effect on the mediastinum which is shifted to the right. There was immediate return of amber colored and slightly turbid fluid. After placement of the pigtail drainage catheter, there was abundant return of fluid. IMPRESSION: Placement of 12 French pigtail drainage catheter into the left pleural space to treat empyema and drowned lung. Electronically Signed   By: Aletta Edouard M.D.   On: 05/08/2017 16:17     Scheduled Meds: . amiodarone  400 mg Oral BID  . budesonide (PULMICORT) nebulizer solution  0.25 mg Nebulization BID  . chlorhexidine  15 mL Mouth Rinse BID  . enoxaparin (LOVENOX) injection  40 mg Subcutaneous QHS  . feeding supplement  1 Container Oral BID BM  . furosemide  20 mg Intravenous Daily  . Gerhardt's butt cream   Topical TID  . insulin aspart  0-9 Units Subcutaneous TID WC  . ipratropium  0.5 mg Nebulization QID  . levalbuterol  0.63 mg Nebulization QID  . mouth rinse  15 mL Mouth Rinse q12n4p  . metoprolol tartrate  25 mg Oral BID  . potassium chloride  40 mEq Oral Q3H   Continuous Infusions: . cefTRIAXone (ROCEPHIN)  IV Stopped (05/09/17 1336)     LOS: 15 days    Time spent: Total of  25 minutes spent with pt, greater than 50% of which was spent in discussion of  treatment, counseling and coordination of care   Kayleen Memos, MD Pager: Text Page via [email or web address]   If 7PM-7AM, please contact night-coverage [email or web address] 05/10/2017, 8:19 AM

## 2017-05-10 NOTE — Plan of Care (Signed)
  Nutrition: Adequate nutrition will be maintained 05/10/2017 0322 - Progressing by Ashley Murrain, RN   Elimination: Will not experience complications related to bowel motility 05/10/2017 0322 - Progressing by Ashley Murrain, RN

## 2017-05-11 ENCOUNTER — Ambulatory Visit: Payer: Medicare Other

## 2017-05-11 ENCOUNTER — Inpatient Hospital Stay (HOSPITAL_COMMUNITY): Payer: Medicare Other

## 2017-05-11 ENCOUNTER — Ambulatory Visit
Admission: RE | Admit: 2017-05-11 | Discharge: 2017-05-11 | Disposition: A | Discharge status: Home / Self Care | Payer: Medicare Other | Source: Ambulatory Visit | Attending: Radiation Oncology | Admitting: Radiation Oncology

## 2017-05-11 LAB — CULTURE, BODY FLUID-BOTTLE: CULTURE: NO GROWTH

## 2017-05-11 LAB — BASIC METABOLIC PANEL
Anion gap: 6 (ref 5–15)
Anion gap: 8 (ref 5–15)
BUN: 17 mg/dL (ref 6–20)
BUN: 16 mg/dL (ref 6–20)
CO2: 39 mmol/L — ABNORMAL HIGH (ref 22–32)
CO2: 39 mmol/L — ABNORMAL HIGH (ref 22–32)
Calcium: 7.4 mg/dL — ABNORMAL LOW (ref 8.9–10.3)
Calcium: 7.8 mg/dL — ABNORMAL LOW (ref 8.9–10.3)
Chloride: 90 mmol/L — ABNORMAL LOW (ref 101–111)
Chloride: 91 mmol/L — ABNORMAL LOW (ref 101–111)
Creatinine, Ser: 0.4 mg/dL — ABNORMAL LOW (ref 0.61–1.24)
Creatinine, Ser: 0.39 mg/dL — ABNORMAL LOW (ref 0.61–1.24)
GFR calc Af Amer: >60 mL/min (ref >60)
GFR calc Af Amer: >60 mL/min (ref >60)
GFR calc non Af Amer: >60 mL/min (ref >60)
GFR calc non Af Amer: >60 mL/min (ref >60)
Glucose, Bld: 308 mg/dL — ABNORMAL HIGH (ref 65–99)
Glucose, Bld: 220 mg/dL — ABNORMAL HIGH (ref 65–99)
Potassium: 2.3 mmol/L — CL (ref 3.5–5.1)
Potassium: 2.6 mmol/L — CL (ref 3.5–5.1)
Sodium: 135 mmol/L (ref 135–145)
Sodium: 138 mmol/L (ref 135–145)

## 2017-05-11 LAB — CBC
HCT: 34.1 % — ABNORMAL LOW (ref 39.0–52.0)
Hemoglobin: 10.8 g/dL — ABNORMAL LOW (ref 13.0–17.0)
MCH: 29.8 pg (ref 26.0–34.0)
MCHC: 31.7 g/dL (ref 30.0–36.0)
MCV: 94.2 fL (ref 78.0–100.0)
Platelets: 148 10*3/uL — ABNORMAL LOW (ref 150–400)
RBC: 3.62 MIL/uL — ABNORMAL LOW (ref 4.22–5.81)
RDW: 13.8 % (ref 11.5–15.5)
WBC: 9 10*3/uL (ref 4.0–10.5)

## 2017-05-11 LAB — CULTURE, BODY FLUID W GRAM STAIN -BOTTLE

## 2017-05-11 LAB — GLUCOSE, CAPILLARY
Glucose-Capillary: 247 mg/dL — ABNORMAL HIGH (ref 65–99)
Glucose-Capillary: 221 mg/dL — ABNORMAL HIGH (ref 65–99)
Glucose-Capillary: 198 mg/dL — ABNORMAL HIGH (ref 65–99)
Glucose-Capillary: 161 mg/dL — ABNORMAL HIGH (ref 65–99)
Glucose-Capillary: 217 mg/dL — ABNORMAL HIGH (ref 65–99)

## 2017-05-11 LAB — ACID FAST SMEAR (AFB, MYCOBACTERIA): Acid Fast Smear: NEGATIVE

## 2017-05-11 LAB — MAGNESIUM: Magnesium: 1.9 mg/dL (ref 1.7–2.4)

## 2017-05-11 MED ORDER — POTASSIUM CHLORIDE CRYS ER 20 MEQ PO TBCR
40.0000 meq | EXTENDED_RELEASE_TABLET | ORAL | Status: AC
  Administered 2017-05-11 (×3): 40 meq via ORAL
  Filled 2017-05-11 (×3): qty 2

## 2017-05-11 MED ORDER — POTASSIUM CHLORIDE CRYS ER 20 MEQ PO TBCR
40.0000 meq | EXTENDED_RELEASE_TABLET | ORAL | Status: DC
  Administered 2017-05-11: 40 meq via ORAL
  Filled 2017-05-11: qty 2

## 2017-05-11 NOTE — Progress Notes (Signed)
Referring Physician(s): Ramaswamy,M  Supervising Physician: Sandi Mariscal  Patient Status:  [facility] - In-pt  Chief Complaint:  Left chest empyema  Subjective: Pt doing ok, resting comfortably, no worsening of resp status   Allergies: Patient has no known allergies.  Medications: Prior to Admission medications   Medication Sig Start Date End Date Taking? Authorizing Provider  acetaminophen (TYLENOL) 500 MG tablet Take 1,000 mg by mouth every 6 (six) hours as needed for moderate pain.   Yes [provider]  albuterol (PROVENTIL HFA;VENTOLIN HFA) 108 (90 Base) MCG/ACT inhaler Inhale 1 puff every 6 (six) hours as needed into the lungs for wheezing or shortness of breath.   Yes [provider]  loperamide (IMODIUM A-D) 2 MG tablet Take 2 mg by mouth 3 (three) times daily as needed for diarrhea or loose stools.   Yes [provider]  metFORMIN (GLUCOPHAGE) 500 MG tablet Take 500 mg 2 (two) times daily with a meal by mouth.    Yes [provider]  methylPREDNISolone (MEDROL DOSEPAK) 4 MG TBPK tablet Take per package instructions Patient taking differently: as directed. Take 6 tablets on Day 1, Take 5 tablets on Day 2, Take 4 tablets on Day 3, Take 3 tablets on Day 4, Take 2 tablets on Day 5 and take 1 tablet on Day 6. 04/24/17  Yes Curt Bears, MD  prochlorperazine (COMPAZINE) 10 MG tablet Take 1 tablet (10 mg total) by mouth every 6 (six) hours as needed for nausea or vomiting. 04/24/17  Yes Kyung Rudd, MD  ACCU-CHEK FASTCLIX LANCETS [facility]  03/19/17   [provider]  ACCU-CHEK GUIDE test strip USE TO CHECK BLOOD SUGAR BID 03/23/17   [provider]  cetirizine (ZYRTEC) 10 MG tablet Take 10 mg daily by mouth. ALLERTEC    [provider]  nystatin (MYCOSTATIN) 100000 UNIT/ML suspension SWISH AND SWALLOW 5 MLS PO  AC AND QHS FOR 7 DAYS 03/31/17   [provider]     Vital Signs: BP 130/61 (BP Location: Right Arm)    Pulse 73   Temp 97.7 F (36.5 C) (Oral)   Resp 20   Ht 5\' 11"  (1.803 m)   Wt 170 lb 3.1 oz (77.2 kg)   SpO2 99% Comment: Pt has a pulse ox with ht monitor  BMI 23.74 kg/m   Physical Exam:  left chest drain intact, insertion site ok, pleuravac replaced today; output 100 cc today, 375 cc yesterday; cx neg  Imaging: Dg Chest 1 View  Result Date: 05/10/2017 CLINICAL DATA:  77 year old male with left-sided chest tube EXAM: CHEST 1 VIEW COMPARISON:  Prior chest x-ray obtained yesterday; CT scan of the chest 05/06/2017 FINDINGS: Stable position of left basilar percutaneous thoracostomy tube. Persistent large left-sided loculated pneumothorax. Perhaps minimal improvement in the basilar component. The entire left lung remains atelectatic. Stable moderate layering right-sided pleural effusion with associated right basilar atelectasis. Unchanged cardiac mediastinal contours. Atherosclerotic calcifications in the transverse aorta. Background pulmonary emphysema. No acute osseous abnormality. IMPRESSION: 1. Stable chest x-ray with left basilar chest tube in place. Minimal change in the large left-sided loculated pneumothorax which is likely ex vacuo in nature and due to failure of expansion of the atelectatic left lung. 2. Stable moderate layering right pleural effusion. Electronically Signed   By: Jacqulynn Cadet M.D.   On: 05/10/2017 12:33   US Renal  Result Date: 05/09/2017 CLINICAL DATA:  77 year old male with urinary retention EXAM: RENAL / URINARY TRACT ULTRASOUND COMPLETE COMPARISON:  None.  FINDINGS: Right Kidney: Length: 13.7 cm. Echogenicity is within normal limits. Circumscribed anechoic cystic lesions are present in the lower pole measuring up to 2.5 and 0.9 cm respectively. Findings are consistent with simple cysts. No evidence of hydronephrosis or nephrolithiasis. Left Kidney: Length: 13.2 cm. Echogenicity is within normal limits. Circumscribed anechoic simple cyst in the lower pole measures  1.5 cm. Bladder: Foley catheter in the collapsed bladder. Other: Multiple mobile echogenic foci with posterior acoustic shadowing in the gallbladder consistent with cholelithiasis. IMPRESSION: 1. No evidence of hydronephrosis. 2. Bilateral simple renal cysts. 3. Cholelithiasis. Electronically Signed   By: Jacqulynn Cadet M.D.   On: 05/09/2017 15:07   Dg Chest Port 1 View  Result Date: 05/11/2017 CLINICAL DATA:  Follow-up chest tube EXAM: PORTABLE CHEST 1 VIEW COMPARISON:  05/10/2017 FINDINGS: Cardiac shadow is enlarged but stable. Small bore chest tube is again seen on the left. Large left hydropneumothorax is again noted and stable. No significant aeration of the left lung is seen. Right lung is well aerated with evidence of small right pleural effusion. This is stable from the prior exam. No new focal abnormality is seen. IMPRESSION: Overall stable appearance of the chest when compared with the prior study. Electronically Signed   By: Inez Catalina M.D.   On: 05/11/2017 07:31   Dg Chest Port 1 View  Result Date: 05/09/2017 CLINICAL DATA:  Left pneumothorax EXAM: PORTABLE CHEST 1 VIEW COMPARISON:  Chest x-ray 05/08/2017 CT chest 05/06/2017 FINDINGS: Persistent large left hydropneumothorax with partial loculated fluid in the mid portion. Left basilar chest tube in satisfactory position. Diffuse mild right lung interstitial thickening. Small right pleural effusion. No right pneumothorax. Stable cardiomediastinal silhouette. No acute osseous abnormality. IMPRESSION: Stable large left hydropneumothorax with a partially loculated component. Left basilar chest tube in satisfactory unchanged position. 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 On chemotherapy. Now presents with shortness of breath and productive cough. EXAM: PORTABLE CHEST 1 VIEW COMPARISON:   CT chest 05/06/2017.  Chest 05/06/2017. FINDINGS: Large left hydropneumothorax is again demonstrated. There is probable collapse of most to the remaining left lung. Changes could represent malignant effusion or empyema. Since the previous study, there is interval placement of a left chest tube. No significant improvement of aeration of the left lung. Mild cardiac enlargement. Small right pleural effusion. No consolidation on the right. Calcification of the aorta. IMPRESSION: Persistent large left hydropneumothorax with left chest tube in place. Electronically Signed   By: Lucienne Capers M.D.   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 EXAM: CT-GUIDED PLACEMENT OF LEFT PLEURAL DRAINAGE CATHETER MEDICATIONS: The patient is currently admitted to the [facility] and receiving intravenous antibiotics. The antibiotics were administered within an appropriate time frame prior to the initiation of the procedure. ANESTHESIA/SEDATION: Fentanyl 50 mcg IV; Versed 0.5 mg IV Moderate Sedation Time:  21 minutes. The patient was continuously monitored during the procedure by the interventional radiology nurse under my direct supervision. COMPLICATIONS: None immediate. PROCEDURE: Informed written consent was obtained from the patient after a thorough discussion of the procedural risks, benefits and alternatives. All questions were  addressed. Maximal Sterile Barrier Technique was utilized including caps, mask, sterile gowns, sterile gloves, sterile drape, hand hygiene and skin antiseptic. A timeout was performed prior to the initiation of the procedure. From a left lateral approach, an 18 gauge trocar needle was advanced into the left pleural space. A guidewire was advanced through the  needle. The percutaneous tract was dilated and a 12 French pigtail drainage catheter advanced. Catheter positioning was confirmed by CT. The catheter was connected to a [place] Pleur-Evac device. The catheter was secured at the skin with a Prolene retention suture and StatLock device. FINDINGS: Large left pleural effusion with component of hydropneumothorax and completely drowned left lung again noted. There is mass-effect on the mediastinum which is shifted to the right. There was immediate return of amber colored and slightly turbid fluid. After placement of the pigtail drainage catheter, there was abundant return of fluid. IMPRESSION: Placement of 12 French pigtail drainage catheter into the left pleural space to treat empyema and drowned lung. Electronically Signed   By: Aletta Edouard M.D.   On: 05/08/2017 16:17    Labs:  CBC: Recent Labs    05/08/17 0422 05/09/17 0730 05/10/17 0458 05/11/17 0738  WBC 14.7* 8.6 6.7 9.0  HGB 11.2* 10.8* 11.0* 10.8*  HCT 36.1* 34.8* 36.2* 34.1*  PLT 193 152 154 148*    COAGS: Recent Labs    05/08/17 0422  INR 1.28    BMP: Recent Labs    05/08/17 0422 05/09/17 0730 05/10/17 0458 05/11/17 0451  NA 139 138 135 138  K 3.6 3.2* 2.3* 2.6*  CL 98* 95* 90* 91*  CO2 35* 37* 39* 39*  GLUCOSE 139* 202* 308* 220*  BUN 18 17 17 16   CALCIUM 8.1* 7.5* 7.4* 7.8*  CREATININE 0.36* 0.40* 0.40* 0.39*  GFRNONAA >60 >60 >60 >60  GFRAA >60 >60 >60 >60    LIVER FUNCTION TESTS: Recent Labs    04/24/17 2054 04/26/17 0311 05/05/17 0333 05/06/17 0316 05/09/17 0730 05/10/17 0458  BILITOT 0.9 0.7  --  0.6 0.6  --   AST 26 25  --  16 20  --   ALT 22 22  --  32 21  --   ALKPHOS 132* 79  --  184* 94  --   PROT 7.4 5.7*  --  4.8* 4.2*  --   ALBUMIN 3.6 2.5* 1.8* 1.7* 1.6* 1.8*    Assessment and Plan: Pt with hx stage IV NSC lung ca, recurrent left pleural effusion/chronic hydroptx/empyema; s/p left chest drain 1/4; afebrile; pleural fl cx/cyt neg;   WBC nl; hgb 10.8, K 2.6- replace; creat .39; CXR today- no sig change from 1/6; plans as per CCM   Electronically Signed: D. Rowe Robert, PA-C 05/11/2017, 1:47 PM   I spent a total of 15 minutes at the the patient's bedside AND on the patient's [facility] floor or unit, greater than 50% of which was counseling/coordinating care for left chest drain    Patient ID: [facility], male   DOB: 01-03-1941, 77 y.o.   MRN: [id number]

## 2017-05-11 NOTE — Progress Notes (Signed)
PROGRESS NOTE [facility]   [facility]   PZW:[id number] DOB: March 22, 1941  DOA: 04/24/2017 PCP: [name], MD   Brief Narrative:  [facility] is a 77 year old male with past medical history of stage IV non-small cell lung cancer, squamous cell carcinoma of left upper lung, diabetes type 2, on chemotherapy, who presented to the emergency department with shortness of breath and productive cough. Patient being currently managed for multiofocal pneumonia (streptococcal) and left-sided Large hydropneumothorax with depression. Patient underwent placement of left pigtail on 05/08/16 with large amount of drainage.   Subjective: Overall feeling better. No CP, abd pain, nausea and vomiting. Patient still SOB on exertion and requiring high flow oxygen supplementation.   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)   Diabetes mellitus type 2 in nonobese (HCC)   Acute respiratory failure with hypoxia and hypercapnia (HCC)   Severe sepsis (HCC)   Malnutrition of moderate degree   Pressure injury of skin   Empyema lung (HCC)   New onset a-fib (HCC)   AKI (acute kidney injury) ([place])   Non-small cell cancer of left lung (HCC)   Goals of care, counseling/discussion   Palliative care encounter   Streptococcal pneumonia ([place])   Malignant neoplasm of left lung ([place])  Acute respiratory failure with hypoxia and hypercarbia / Sepsis secondary to streptococcal multifocal pneumonia / Empyema w +/- malignant effusion  - CXR on admission showed left perihilar soft tissue mass consistent with known malignancy. - Pt is status post thoracentesis 12/23 - 1.4 L fluid removed; thoracentesis 12/27 - 2.3 L fluid, 05/06/17 0.5L removed fluid analysis c/w bacteria empyema.  -s/p Pigtail chest tube placement 05/08/16 by IR - high output on pleur-evac  -Blood cx negative  -Pleural fluid cx grew strep viridans;  continue abx's as per ID -will follow PCCM recommendations  -Per ID recommendation continue Rocephin while inpatient, upon discharge can change to oral formulation to complete 28 days given empyema   -daily cxr started 05/10/17; no significant changes appreciated. -Still requiring high O2 supplement 8-10 L sat 99% -Not previously on O2 supplement at home   Leukocytosis: due to above -WBC normalized  -afebrile -continue current abx's  Edema - continues to improve   - Hypoproteinemia, 3rd spacing. - Continue lasix IV 20 mg - remains with good urine output  - Monitor Cr while on Lasix  - follow daily weights and strict intake and output   Metastatic stage IV lung cancer -Follows with Dr. [name] -continue radiotherapy and chemotherapy   Afib with RVR - stable  -rate controlled -CHA2DS2-VASc Score 2 -will continue amiodarone and metoprolol -cards saw, recommended against anticoagulation given new afib and significant commorbidities (see 12/27 cards c/s note)  AKI - Resolved  -probably due to Secondary to sepsis -follow Cr trend    Diabetes type 2 without complications- Stable  -continue SSI  -follow CBG's  Moderate protein calorie malnutrition -In the setting of chronic illness -Follow nutritional service rec's -patient reported appetite is better    Skin pressure injury, stage 1(left armpit) / Stage 2 sacral ulcer -will follow rec's from wound care service -continue physical preventive measures.  Urinary retention  -Unclear etiology -continue Foley for now -will attempt voiding trial tomorrow -Normal PSA  -renal US done on 1/5 demonstrated no hydronephrosis or obstructive uropathy.    DVT prophylaxis: Lovenox sq daily Code Status: Full Code  Family Communication: None at bedside  Disposition Plan: plans is for patient to go to SNF when medically stable.    Consultants:   IR  PCCM  ID   Palliative   Procedures:   Thoracentesis 12/23 and  12/27   Antimicrobials: Anti-infectives (From admission, onward)   Start     Dose/Rate Route Frequency Ordered Stop   04/29/17 1200  cefTRIAXone (ROCEPHIN) 2 g in dextrose 5 % 50 mL IVPB     2 g 100 mL/hr over 30 Minutes Intravenous Every 24 hours 04/29/17 1100     04/25/17 1200  vancomycin (VANCOCIN) IVPB 750 mg/150 ml premix  Status:  Discontinued     750 mg 150 mL/hr over 60 Minutes Intravenous Every 12 hours 04/25/17 0835 04/27/17 1406   04/25/17 1000  ceFEPIme (MAXIPIME) 1 g in dextrose 5 % 50 mL IVPB  Status:  Discontinued     1 g 100 mL/hr over 30 Minutes Intravenous Every 8 hours 04/25/17 0835 04/29/17 1100   04/25/17 0015  ceFEPIme (MAXIPIME) 2 g in dextrose 5 % 50 mL IVPB     2 g 100 mL/hr over 30 Minutes Intravenous  Once 04/25/17 0001 04/25/17 0119   04/25/17 0015  vancomycin (VANCOCIN) IVPB 1000 mg/200 mL premix     1,000 mg 200 mL/hr over 60 Minutes Intravenous  Once 04/25/17 0001 04/25/17 0259   04/25/17 0015  azithromycin (ZITHROMAX) 500 mg in dextrose 5 % 250 mL IVPB  Status:  Discontinued     500 mg 250 mL/hr over 60 Minutes Intravenous Daily at bedtime 04/25/17 0009 04/29/17 1100      Objective: Vitals:   05/11/17 1025 05/11/17 1404 05/11/17 1934 05/11/17 2101  BP:  (!) 107/46  (!) 100/56  Pulse:  70  65  Resp:  (!) 22  (!) 21  Temp:  97.8 F (36.6 C)  98.8 F (37.1 C)  TempSrc:  Oral  Oral  SpO2: 99% 99% 95% 93%  Weight:      Height:        Intake/Output Summary (Last 24 hours) at 05/11/2017 2125 Last data filed at 05/11/2017 1700 Gross per 24 hour  Intake 1370 ml  Output 1150 ml  Net 220 ml   Filed Weights   05/09/17 0442 05/10/17 0436 05/11/17 0439  Weight: 78.6 kg (173 lb 3.2 oz) 76.2 kg (168 lb) 77.2 kg (170 lb 3.1 oz)    Examination: General: afebrile, feeling better and denying CP. Patient reported improvement in his breathing and also appetite.  Cardiovascular: RRR, no rubs or gallops, positive soft SEM, no JVD.  Respiratory: improved air  movement, no wheezing, drain in place, no air leak.  Abdominal: soft, NT, ND, positive BS Extremities: trace to 1+ edema appreciated, no cyanosis   Data Reviewed: I have personally reviewed following labs and imaging studies  CBC: Recent Labs  Lab 05/06/17 0316 05/07/17 0411 05/08/17 0422 05/09/17 0730 05/10/17 0458 05/11/17 0738  WBC 16.2* 11.9* 14.7* 8.6 6.7 9.0  NEUTROABS 14.3 10.7* 13.3* 7.6 5.8  --   HGB 11.7* 11.0* 11.2* 10.8* 11.0* 10.8*  HCT 37.0* 35.9* 36.1* 34.8* 36.2* 34.1*  MCV 94.6 94.5 96.3 95.3 94.5 94.2  PLT 244 202 193 152 154 016*   Basic Metabolic Panel: Recent Labs  Lab 05/05/17 0333 05/06/17 0316 05/07/17 0411 05/08/17 0422 05/09/17 0730 05/10/17 0458 05/11/17 0451  NA 136 137  --  139 138 135 138  K 4.5 4.1  --  3.6 3.2* 2.3* 2.6*  CL 103 102  --  98* 95* 90* 91*  CO2 26 28  --  35* 37* 39* 39*  GLUCOSE 131* 178*  --  139* 202* 308* 220*  BUN 14 19  --  18 17 17 16   CREATININE 0.40* 0.38*  --  0.36* 0.40* 0.40* 0.39*  CALCIUM 7.9* 7.8*  --  8.1* 7.5* 7.4* 7.8*  MG 1.8 1.8 1.8 1.8 1.7 1.7 1.9  PHOS 2.6 2.6 3.0 2.7  --   --   --    GFR: Estimated Creatinine Clearance: 82.4 mL/min (A) (by C-G formula based on SCr of 0.39 mg/dL (L)).   Liver Function Tests: Recent Labs  Lab 05/05/17 0333 05/06/17 0316 05/09/17 0730 05/10/17 0458  AST  --  16 20  --   ALT  --  32 21  --   ALKPHOS  --  184* 94  --   BILITOT  --  0.6 0.6  --   PROT  --  4.8* 4.2*  --   ALBUMIN 1.8* 1.7* 1.6* 1.8*   Coagulation Profile: Recent Labs  Lab 05/08/17 0422  INR 1.28   CBG: Recent Labs  Lab 05/10/17 1650 05/10/17 2045 05/11/17 0730 05/11/17 1153 05/11/17 1612  GLUCAP 107* 284* 221* 198* 161*    Recent Results (from the past 240 hour(s))  Acid Fast Smear (AFB)     Status: None   Collection Time: 05/06/17  2:58 PM  Result Value Ref Range Status   AFB Specimen Processing Concentration  Final   Acid Fast Smear Negative  Final    Comment:  (NOTE) Performed At: [facility] [address] [place], [place] [id number] Rush Farmer MD YJ:[id number]    Source (AFB) PLEURAL  Final    Comment: LEFT  Culture, body fluid-bottle     Status: None   Collection Time: 05/06/17  2:58 PM  Result Value Ref Range Status   Specimen Description PLEURAL LEFT  Final   Special Requests NONE  Final   Culture   Final    NO GROWTH 5 DAYS Performed at [facility], [place] [address] [place], [place] [address]    Report Status 05/11/2017 FINAL  Final  Gram stain     Status: None   Collection Time: 05/06/17  2:58 PM  Result Value Ref Range Status   Specimen Description PLEURAL LEFT  Final   Special Requests NONE  Final   Gram Stain   Final    MODERATE WBC PRESENT, PREDOMINANTLY PMN NO ORGANISMS SEEN Performed at [facility], [place] [address], [place], [place] [address]    Report Status 05/06/2017 FINAL  Final     Radiology Studies: Dg Chest 1 View  Result Date: 05/10/2017 CLINICAL DATA:  77 year old male with left-sided chest tube EXAM: CHEST 1 VIEW COMPARISON:  Prior chest x-ray obtained yesterday; CT scan of the chest 05/06/2017 FINDINGS: Stable position of left basilar percutaneous thoracostomy tube. Persistent large left-sided loculated pneumothorax. Perhaps minimal improvement in the basilar component. The entire left lung remains atelectatic. Stable moderate layering right-sided pleural effusion with associated right basilar atelectasis. Unchanged cardiac mediastinal contours. Atherosclerotic calcifications in the transverse aorta. Background pulmonary emphysema. No acute osseous abnormality. IMPRESSION: 1. Stable chest x-ray with left basilar chest tube in place. Minimal change in the large left-sided loculated pneumothorax which is likely ex vacuo in nature and due to failure of expansion of the atelectatic left lung. 2. Stable moderate layering right pleural effusion. Electronically Signed   By: Dellis Filbert.D.  On: 05/10/2017 12:33   Dg Chest Port 1 View  Result Date: 05/11/2017 CLINICAL DATA:  Follow-up chest tube EXAM: PORTABLE CHEST 1 VIEW COMPARISON:  05/10/2017 FINDINGS: Cardiac shadow is enlarged but stable. Small bore chest tube is again seen on the left. Large left hydropneumothorax is again noted and stable. No significant aeration of the left lung is seen. Right lung is well aerated with evidence of small right pleural effusion. This is stable from the prior exam. No new focal abnormality is seen. IMPRESSION: Overall stable appearance of the chest when compared with the prior study. Electronically Signed   By: Inez Catalina M.D.   On: 05/11/2017 07:31    Scheduled Meds: . amiodarone  400 mg Oral BID  . budesonide (PULMICORT) nebulizer solution  0.25 mg Nebulization BID  . chlorhexidine  15 mL Mouth Rinse BID  . enoxaparin (LOVENOX) injection  40 mg Subcutaneous QHS  . feeding supplement  1 Container Oral BID BM  . furosemide  20 mg Intravenous Daily  . Gerhardt's butt cream   Topical TID  . insulin aspart  0-9 Units Subcutaneous TID WC  . ipratropium  0.5 mg Nebulization TID  . levalbuterol  1.25 mg Nebulization TID  . mouth rinse  15 mL Mouth Rinse q12n4p  . metoprolol tartrate  25 mg Oral BID   Continuous Infusions: . cefTRIAXone (ROCEPHIN)  IV Stopped (05/11/17 1353)     LOS: 16 days    Time spent: Total of  25 minutes spent with pt, greater than 50% of which was spent in discussion of  treatment, counseling and coordination of care   Barton Dubois, MD Pager: [phone]  If 7PM-7AM, please contact night-coverage [email or web address] 05/11/2017, 9:25 PM

## 2017-05-11 NOTE — Progress Notes (Signed)
CRITICAL VALUE ALERT  Critical Value:  K 2.6  Date & Time Notied:  05/10/16 0535  Provider Notified:[place]heimer  Orders Received/Actions taken: Np on call made aware

## 2017-05-11 NOTE — Progress Notes (Signed)
[facility] PCCM  S: Wants to go home  O:  Vitals:   05/10/17 2051 05/11/17 0439 05/11/17 1015 05/11/17 1025  BP: (!) 121/53 130/61    Pulse: 75 73    Resp: 20 20    Temp: 97.9 F (36.6 C) 97.7 F (36.5 C)    TempSrc: Oral Oral    SpO2: 95% 98% 99% 99%  Weight:  77.2 kg (170 lb 3.1 oz)    Height:       10 L HFNC  General:  Resting comfortably in bed HENT: NCAT OP clear, temporal wasting PULM: Diminished on left B, normal effort CV: RRR, no mgr GI: BS+, soft, nontender MSK: normal bulk and tone Neuro: awake, alert, no distress, MAEW  CT images reviewed: large left pleural effusion, hydropneumo, central mass, pigtail in place CXR: pigtail in left lung, effusion  Impression/plan:  Empyema: high output chest drainage, from lung entrapment and underlying cancer (though multiple pleural fluid cytologies are negative): there is not a great way to deal with this.  Options are to either 1) pull the chest tube and see how he does, 2) convert to pleurex, or 3) pleuridese.  While option 2 and 3 make the most sense given the high output nature of the fluid, I don't think they are safe with the recent infection.  We will observe CT output again for another 24 hours and send pleural fluid glucose and pH tomorrow to see if it looks like there is still active infection in his chest (I doubt it).    Goals of care: we really need to consider moving towards full comfort measures.  I know him from his diagnostic bronchoscopy and he is a shell of the man that I knew just 5 weeks ago.  Roselie Awkward, MD [place] [facility] Pager: [phone] Cell: [phone] After 3pm or if no response, call [phone]

## 2017-05-12 ENCOUNTER — Ambulatory Visit: Payer: Medicare Other

## 2017-05-12 ENCOUNTER — Ambulatory Visit
Admission: RE | Admit: 2017-05-12 | Discharge: 2017-05-12 | Disposition: A | Discharge status: Home / Self Care | Payer: Medicare Other | Source: Ambulatory Visit | Attending: Radiation Oncology | Admitting: Radiation Oncology

## 2017-05-12 DIAGNOSIS — C3412 Malignant neoplasm of upper lobe, left bronchus or lung: Secondary | ICD-10-CM

## 2017-05-12 LAB — GLUCOSE, CAPILLARY
GLUCOSE-CAPILLARY: 207 mg/dL — AB (ref 65–99)
Glucose-Capillary: 268 mg/dL — ABNORMAL HIGH (ref 65–99)
Glucose-Capillary: 388 mg/dL — ABNORMAL HIGH (ref 65–99)
Glucose-Capillary: 176 mg/dL — ABNORMAL HIGH (ref 65–99)

## 2017-05-12 LAB — BASIC METABOLIC PANEL
Anion gap: 9 (ref 5–15)
BUN: 16 mg/dL (ref 6–20)
CHLORIDE: 92 mmol/L — AB (ref 101–111)
CO2: 34 mmol/L — AB (ref 22–32)
CREATININE: 0.47 mg/dL — AB (ref 0.61–1.24)
Calcium: 7.5 mg/dL — ABNORMAL LOW (ref 8.9–10.3)
GFR calc non Af Amer: >60 mL/min (ref >60)
Glucose, Bld: 347 mg/dL — ABNORMAL HIGH (ref 65–99)
Potassium: 3.4 mmol/L — ABNORMAL LOW (ref 3.5–5.1)
Sodium: 135 mmol/L (ref 135–145)

## 2017-05-12 MED ORDER — LIP MEDEX EX OINT
TOPICAL_OINTMENT | CUTANEOUS | Status: DC | PRN
  Administered 2017-05-12: 12:00:00 via TOPICAL
  Filled 2017-05-12: qty 7

## 2017-05-12 NOTE — Progress Notes (Signed)
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 Patient being currently managed for multiofocal pneumonia (streptococcal) and left-sided Large hydropneumothorax with depression. Patient underwent placement of left pigtail on 05/08/16 with large amount of drainage.   Subjective: Overall feeling better. No CP, abd pain, nausea and vomiting. Patient still SOB on exertion and requiring high flow oxygen supplementation.   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)   Diabetes mellitus type 2 in nonobese (HCC)   Acute respiratory failure with hypoxia and hypercapnia (HCC)   Severe sepsis (HCC)   Malnutrition of moderate degree   Pressure injury of skin   Empyema lung (HCC)   New onset a-fib (HCC)   AKI (acute kidney injury) ([place])   Non-small cell cancer of left lung (HCC)   Goals of care, counseling/discussion   Palliative care encounter   Streptococcal pneumonia ([place])   Malignant neoplasm of left lung ([place])  Acute respiratory failure with hypoxia and hypercarbia / Sepsis secondary to streptococcal multifocal pneumonia / Empyema w +/- malignant effusion  - CXR on admission showed left perihilar soft tissue mass consistent with known malignancy. - Pt is status post thoracentesis 12/23 - 1.4 L fluid removed; thoracentesis 12/27 - 2.3 L fluid, 05/06/17 0.5L removed fluid analysis c/w bacteria empyema.  -s/p Pigtail chest tube placement 05/08/16 by IR - high output on pleur-evac  -Blood cx negative  -Pleural fluid cx grew strep viridans;  continue abx's as per ID -will follow PCCM recommendations  -Per ID recommendation continue Rocephin while inpatient, upon discharge can change to oral formulation to complete 28 days given empyema   -daily cxr started 05/10/17; no significant changes appreciated. -Still requiring high O2 supplement 8-10 L sat 99%; will weaned as tolerated  -Not previously on O2 supplement at home   Leukocytosis: due to above -WBC normalized  -patient has remained afebrile -continue current abx's  Edema - continues to improve   -Hypoproteinemia, 3rd spacing. -Continue lasix IV 20 mg - remains with good urine output  -Monitor Cr while on Lasix  -continue daily weights and strict intake and output   Metastatic stage IV lung cancer -Follows with Dr. [name] -continue outpatient follow up  Afib with RVR - stable  -rate controlled -CHA2DS2-VASc Score 2 -will continue amiodarone and metoprolol -cards saw, recommended against anticoagulation given new afib and significant commorbidities (see 12/27 cards c/s note)  AKI - Resolved  -probably due to sepsis -follow Cr trend    Diabetes type 2 without complications- Stable  -continue SSI  -follow CBG's and further adjust hypoglycemic regimen as needed; patient reported appetite has improved.   Moderate protein calorie malnutrition -In the setting of chronic illness -Follow nutritional service rec's -patient reported appetite is better    Skin pressure injury, stage 1(left armpit) / Stage 2 sacral ulcer -will follow rec's from wound care service -continue physical preventive measures.  Urinary retention  -Unclear etiology -will attempt voiding trial, follow response  -Normal PSA  -renal US done on 1/5 demonstrated no hydronephrosis or obstructive uropathy.    DVT prophylaxis: Lovenox sq  daily Code Status: Full Code  Family Communication: None at bedside  Disposition Plan: plans is for patient to go to SNF when medically stable.   Will follow PCCM and palliative care rec's.  Consultants:   IR  PCCM  ID   Palliative   Procedures:   Thoracentesis 12/23 and 12/27   Antimicrobials: Anti-infectives (From admission, onward)   Start     Dose/Rate Route Frequency Ordered Stop   04/29/17 1200  cefTRIAXone (ROCEPHIN) 2 g in dextrose 5 % 50 mL IVPB     2 g 100 mL/hr over 30 Minutes Intravenous Every 24 hours 04/29/17 1100     04/25/17 1200  vancomycin (VANCOCIN) IVPB 750 mg/150 ml premix  Status:  Discontinued     750 mg 150 mL/hr over 60 Minutes Intravenous Every 12 hours 04/25/17 0835 04/27/17 1406   04/25/17 1000  ceFEPIme (MAXIPIME) 1 g in dextrose 5 % 50 mL IVPB  Status:  Discontinued     1 g 100 mL/hr over 30 Minutes Intravenous Every 8 hours 04/25/17 0835 04/29/17 1100   04/25/17 0015  ceFEPIme (MAXIPIME) 2 g in dextrose 5 % 50 mL IVPB     2 g 100 mL/hr over 30 Minutes Intravenous  Once 04/25/17 0001 04/25/17 0119   04/25/17 0015  vancomycin (VANCOCIN) IVPB 1000 mg/200 mL premix     1,000 mg 200 mL/hr over 60 Minutes Intravenous  Once 04/25/17 0001 04/25/17 0259   04/25/17 0015  azithromycin (ZITHROMAX) 500 mg in dextrose 5 % 250 mL IVPB  Status:  Discontinued     500 mg 250 mL/hr over 60 Minutes Intravenous Daily at bedtime 04/25/17 0009 04/29/17 1100      Objective: Vitals:   05/12/17 1304 05/12/17 1458 05/12/17 2024 05/12/17 2112  BP:  (!) 101/53  (!) 114/50  Pulse:  83  90  Resp:  19  18  Temp:  98.2 F (36.8 C)  98.2 F (36.8 C)  TempSrc:  Oral  Oral  SpO2: 100% 97% 98% 98%  Weight:      Height:        Intake/Output Summary (Last 24 hours) at 05/12/2017 2227 Last data filed at 05/12/2017 1900 Gross per 24 hour  Intake 720 ml  Output 1625 ml  Net -905 ml   Filed Weights   05/10/17 0436 05/11/17 0439 05/12/17 0513  Weight: 76.2 kg (168 lb) 77.2 kg (170 lb 3.1 oz) 73.9 kg (163 lb)    Examination: General: no fever, reports improvement in appetite and breathing. No CP and overall  feeling better. He would like to start PT rehab.  Rest of physical exam unchanged from examination on 05/11/17; see below for details:  Cardiovascular: RRR, no rubs or gallops, positive soft SEM, no JVD.  Respiratory: improved air movement, no wheezing, drain in place, no air leak.  Abdominal: soft, NT, ND, positive BS Extremities: trace to 1+ edema appreciated, no cyanosis   Data Reviewed: I have personally reviewed following labs and imaging studies  CBC: Recent Labs  Lab 05/06/17 0316 05/07/17 0411 05/08/17 0422 05/09/17 0730 05/10/17 0458 05/11/17 0738  WBC 16.2* 11.9* 14.7* 8.6 6.7 9.0  NEUTROABS 14.3 10.7* 13.3* 7.6 5.8  --   HGB 11.7* 11.0* 11.2* 10.8* 11.0* 10.8*  HCT 37.0* 35.9* 36.1* 34.8* 36.2* 34.1*  MCV 94.6 94.5 96.3 95.3 94.5 94.2  PLT 244 202 193 152 154 580*   Basic Metabolic Panel: Recent Labs  Lab 05/06/17 0316 05/07/17 0411 05/08/17 0422 05/09/17 0730 05/10/17  8250 05/11/17 0451 05/12/17 1219  NA 137  --  139 138 135 138 135  K 4.1  --  3.6 3.2* 2.3* 2.6* 3.4*  CL 102  --  98* 95* 90* 91* 92*  CO2 28  --  35* 37* 39* 39* 34*  GLUCOSE 178*  --  139* 202* 308* 220* 347*  BUN 19  --  18 17 17 16 16   CREATININE 0.38*  --  0.36* 0.40* 0.40* 0.39* 0.47*  CALCIUM 7.8*  --  8.1* 7.5* 7.4* 7.8* 7.5*  MG 1.8 1.8 1.8 1.7 1.7 1.9  --   PHOS 2.6 3.0 2.7  --   --   --   --    GFR: Estimated Creatinine Clearance: 80.8 mL/min (A) (by C-G formula based on SCr of 0.47 mg/dL (L)).   Liver Function Tests: Recent Labs  Lab 05/06/17 0316 05/09/17 0730 05/10/17 0458  AST 16 20  --   ALT 32 21  --   ALKPHOS 184* 94  --   BILITOT 0.6 0.6  --   PROT 4.8* 4.2*  --   ALBUMIN 1.7* 1.6* 1.8*   Coagulation Profile: Recent Labs  Lab 05/08/17 0422  INR 1.28   CBG: Recent Labs  Lab 05/11/17 1612 05/11/17 2109 05/12/17 0723 05/12/17 1122 05/12/17 1638  GLUCAP 161* 217* 268* 388* 207*    Recent Results (from the past 240 hour(s))  Acid Fast Smear (AFB)      Status: None   Collection Time: 05/06/17  2:58 PM  Result Value Ref Range Status   AFB Specimen Processing Concentration  Final   Acid Fast Smear Negative  Final    Comment: (NOTE) Performed At: [facility] [address] [place], [place] [id number] Rush Farmer MD PF:[id number]    Source (AFB) PLEURAL  Final    Comment: LEFT  Culture, body fluid-bottle     Status: None   Collection Time: 05/06/17  2:58 PM  Result Value Ref Range Status   Specimen Description PLEURAL LEFT  Final   Special Requests NONE  Final   Culture   Final    NO GROWTH 5 DAYS Performed at [facility], [place] [address] [place], [place] [address]    Report Status 05/11/2017 FINAL  Final  Gram stain     Status: None   Collection Time: 05/06/17  2:58 PM  Result Value Ref Range Status   Specimen Description PLEURAL LEFT  Final   Special Requests NONE  Final   Gram Stain   Final    MODERATE WBC PRESENT, PREDOMINANTLY PMN NO ORGANISMS SEEN Performed at [facility], [place] [address] [place] [address], [place], [place] [address]    Report Status 05/06/2017 FINAL  Final     Radiology Studies: Dg Chest Port 1 View  Result Date: 05/11/2017 CLINICAL DATA:  Follow-up chest tube EXAM: PORTABLE CHEST 1 VIEW COMPARISON:  05/10/2017 FINDINGS: Cardiac shadow is enlarged but stable. Small bore chest tube is again seen on the left. Large left hydropneumothorax is again noted and stable. No significant aeration of the left lung is seen. Right lung is well aerated with evidence of small right pleural effusion. This is stable from the prior exam. No new focal abnormality is seen. IMPRESSION: Overall stable appearance of the chest when compared with the prior study. Electronically Signed   By: Inez Catalina M.D.   On: 05/11/2017 07:31    Scheduled Meds: . amiodarone  400 mg Oral BID  . budesonide (PULMICORT)  nebulizer solution  0.25 mg Nebulization BID  . chlorhexidine  15 mL Mouth Rinse BID  . enoxaparin (LOVENOX)  injection  40 mg Subcutaneous QHS  . feeding supplement  1 Container Oral BID BM  . furosemide  20 mg Intravenous Daily  . Gerhardt's butt cream   Topical TID  . insulin aspart  0-9 Units Subcutaneous TID WC  . ipratropium  0.5 mg Nebulization TID  . levalbuterol  1.25 mg Nebulization TID  . mouth rinse  15 mL Mouth Rinse q12n4p  . metoprolol tartrate  25 mg Oral BID   Continuous Infusions: . cefTRIAXone (ROCEPHIN)  IV Stopped (05/12/17 1153)     LOS: 17 days    Time spent: Total of  20 minutes spent with pt, greater than 50% of which was spent in discussion of  treatment, counseling and coordination of care   Barton Dubois, MD Pager: [phone]  If 7PM-7AM, please contact night-coverage [email or web address] 05/12/2017, 10:27 PM

## 2017-05-12 NOTE — Care Management Note (Signed)
Case Management Note  Patient Details  Name: Maurice Tyler MRN: [id number] Date of Birth: [birth year]  Subjective/Objective: Pt admitted with Multifocal Pneumonia                   Action/Plan:Spoke with pt's wife concerning discharge plans. Pt plan to discharge to SNF.   Expected Discharge Date:                  Expected Discharge Plan:  Skilled Nursing Facility  In-House Referral:  Clinical Social Work  Discharge planning Services  CM Consult  Post Acute Care Choice:    Choice offered to:     DME Arranged:    DME Agency:     HH Arranged:    [place] Agency:     Status of Service:  In process, will continue to follow  If discussed at Long Length of Stay Meetings, dates discussed:    Additional CommentsPurcell Mouton, RN 05/12/2017, 4:02 PM

## 2017-05-12 NOTE — Progress Notes (Signed)
Inpatient Diabetes Program Recommendations  AACE/ADA: New Consensus Statement on Inpatient Glycemic Control (2015)  Target Ranges:  Prepandial:   less than 140 mg/dL      Peak postprandial:   less than 180 mg/dL (1-2 hours)      Critically ill patients:  140 - 180 mg/dL   Lab Results  Component Value Date   GLUCAP 388 (H) 05/12/2017    Review of Glycemic Control  Blood sugars > 200 mg/dL.  Inpatient Diabetes Program Recommendations:     If appropriate, increase Novolog to 0-15 units tidwc and hs.  Continue to follow.  Thank you. Lorenda Peck, RD, LDN, CDE Inpatient Diabetes Coordinator [phone]

## 2017-05-12 NOTE — Progress Notes (Signed)
Physical Therapy Discharge Patient Details Name: Kj Imbert MRN: [id number] DOB: 21-Feb-1941 Today's Date: 05/12/2017 Time:  -     Patient discharged from PT services secondary to medical decline - will need to re-order PT to resume therapy services.  Please see latest therapy progress note for current level of functioning and progress toward goals.    Per RN, patient tolerates very little mobility. Currently on HFNC at 10 Liters. Noted that Palliative medicine is recommended.   GP     Claretha Cooper 05/12/2017, 5:13 PM Tresa Endo PT [phone]

## 2017-05-12 NOTE — Progress Notes (Signed)
Palliative:  I met again today with Maurice Tyler. We addressed the confusion last week and he shares that his wife "panicked." He says that she thought I was "doctor death" but he says he does not believe this. He shares with me that he is feeling much better and hoping to get out of bed and ambulate soon. He says he feels better, stronger, and appetite is better. We also discussed concern for the volume of fluid draining in his chest tube concerning for ongoing infection.   He tells me that he continues to choose to remain optimistic. He goes on to tell me that about his plans to go to [place], [place] and even has a picture of this on his wall in his hospital wall. He says "I will go here even if I get there and take my last breathe." He talks indicating that he knows he may not live long but he is certain that he will live to make this last trip. He goes on to tell me that Dr. Mohamed has told him no more chemotherapy - Maurice Tyler is very happy to NOT have chemotherapy. Before we could discuss further what this means he requests time to rest.   25 min   , NP Palliative Medicine Team Pager # [phone] (M-F 8a-5p) Team Phone # [phone] (Nights/Weekends) 

## 2017-05-12 NOTE — Progress Notes (Signed)
[facility] PCCM  S: He says he feels better, wants to walk  O:  Vitals:   05/11/17 1934 05/11/17 2101 05/12/17 0513 05/12/17 0802  BP:  (!) 100/56 (!) 98/55   Pulse:  65 74   Resp:  (!) 21 (!) 22   Temp:  98.8 F (37.1 C) 98.7 F (37.1 C)   TempSrc:  Oral Oral   SpO2: 95% 93% 100% 100%  Weight:   73.9 kg (163 lb)   Height:       5L [place]  General:  Resting comfortably in bed HENT: NCAT temporal wasting PULM: Poor air movement on L, normal effort CV: RRR, no mgr GI: BS+, soft, nontender MSK: normal bulk and tone Neuro: awake, alert, no distress, MAEW  Impression: Left empyema Left side lung cancer Hypoxemic respiratory failure Severe protein calorie malnutrition Streptococcal pneumonia  Discussion: He remains ever optomistic, but his condition has not significantly improved apart from improved oxygen needs today.  He still has over a liter of fluid output per 24 hour period and the fluid still has a high amount of sediment making me worried it is still actively infected.  Given the amount of fluid coming out, recent empyema, and high oxygen needs it seems unlikely we will meet his goal of getting out of the hospital and going on a trip with his wife.  Given his rapid overall worsening, I think it is reasonable to consider palliative care and hospice.  I told him this today.  Plan: Send pleural fluid glucose and pH today, if they are low this would represent active bacterial infection in the left lung Continue to chest tube to drainage Repeat CXR in AM Wean off O2 as able Mobilize  Roselie Awkward, MD [place] PCCM Pager: [phone] Cell: [phone] After 3pm or if no response, call [phone]

## 2017-05-12 NOTE — Care Management Important Message (Signed)
Important Message  Patient Details  Name: Maurice Tyler MRN: [id number] Date of Birth: 1940-06-30   Medicare Important Message Given:  Yes    Kerin Salen 05/12/2017, 10:42 AMImportant Message  Patient Details  Name: Maurice Tyler MRN: [id number] Date of Birth: [birth year]   Medicare Important Message Given:  Yes    Kerin Salen 05/12/2017, 10:42 AM

## 2017-05-13 ENCOUNTER — Ambulatory Visit: Payer: Medicare Other

## 2017-05-13 ENCOUNTER — Other Ambulatory Visit: Payer: TRICARE For Life (TFL)

## 2017-05-13 ENCOUNTER — Ambulatory Visit
Admission: RE | Admit: 2017-05-13 | Discharge: 2017-05-13 | Disposition: A | Discharge status: Home / Self Care | Payer: Medicare Other | Source: Ambulatory Visit | Attending: Radiation Oncology | Admitting: Radiation Oncology

## 2017-05-13 ENCOUNTER — Inpatient Hospital Stay (HOSPITAL_COMMUNITY): Payer: Medicare Other

## 2017-05-13 LAB — GLUCOSE, CAPILLARY
GLUCOSE-CAPILLARY: 252 mg/dL — AB (ref 65–99)
GLUCOSE-CAPILLARY: 178 mg/dL — AB (ref 65–99)
Glucose-Capillary: 229 mg/dL — ABNORMAL HIGH (ref 65–99)
Glucose-Capillary: 152 mg/dL — ABNORMAL HIGH (ref 65–99)

## 2017-05-13 NOTE — Progress Notes (Signed)
Patient had not voided successfully during shift. Bladder scan completed, greater than 900 cc. Provider updated, foley cathter placed. Will continue to monitor.

## 2017-05-13 NOTE — Progress Notes (Signed)
[facility] PCCM  Still waiting for pleural fluid glucose value. Will see again afterwards  Roselie Awkward, MD [place] PCCM Pager: [phone] Cell: [phone] After 3pm or if no response, call [phone]

## 2017-05-13 NOTE — Progress Notes (Signed)
Nutrition Follow-up  DOCUMENTATION CODES:   Non-severe (moderate) malnutrition in context of chronic illness  INTERVENTION:    Boost Breeze po BID, each supplement provides 250 kcal and 9 grams of protein  Magic cup TID with meals, each supplement provides 290 kcal and 9 grams of protein  NUTRITION DIAGNOSIS:   Moderate Malnutrition related to chronic illness, cancer and cancer related treatments as evidenced by percent weight loss, energy intake < or equal to 50% for > or equal to 1 month, moderate fat depletion, moderate muscle depletion.  Ongoing  GOAL:   Patient will meet greater than or equal to 90% of their needs  Progressing  MONITOR:   PO intake, Supplement acceptance, Weight trends, Labs, Skin  REASON FOR ASSESSMENT:   Malnutrition Screening Tool    ASSESSMENT:   Pt with PMH significant for DM and recently diagnosed with non-small cell lung carcinoma. Pt started chemotherapy last week and has had increasing shortness of breath with productive cough since. Admitted for acute respiratory failure with sepsis from multifocal PNA and large left PE.  Pt with left chest tube.   12/23- thoracentesis, 1.4 L removed 12/27- thoracentesis, 2.3 L removed 1/2- 0.5 L removed fluid analysis c/w bacteria empyema  1/4- pigtail chest tube placement   Pt reports appetite is back to baseline. Meal completions charted as 75-100% for the last 7 meals. Looked into meal content, pt consuming a lot of soup and pudding. Discussed choosing foods that are higher in calories and protein to help with weight gain. Pt amendable and is to order meatloaf for lunch. Pt enjoys boost breeze twice a day and is willing to try magic cups for more calories/protein. RD to add. Weight noted to trend down 17 lb since last RD visit likely related to fluid loss as pt is receiving 20mg  lasix daily. Pt noted to be -2887 ml fluid balance. Pt showing new depletions with fluid loss. See below.   Medications  reviewed and include: lasix, IV abx 20 mg Labs reviewed: K 3.4 (L) Cl 92 (L) CBG 161-252 (H)   NUTRITION - FOCUSED PHYSICAL EXAM:    Most Recent Value  Orbital Region  Moderate depletion  Upper Arm Region  Moderate depletion  Thoracic and Lumbar Region  Unable to assess  Buccal Region  Severe depletion  Temple Region  Moderate depletion  Clavicle Bone Region  Moderate depletion  Clavicle and Acromion Bone Region  Moderate depletion  Scapular Bone Region  Unable to assess  Dorsal Hand  Mild depletion  Patellar Region  Severe depletion  Anterior Thigh Region  Severe depletion  Posterior Calf Region  Severe depletion  Edema (RD Assessment)  None  Hair  Reviewed  Eyes  Reviewed  Mouth  Unable to assess  Skin  Reviewed  Nails  Reviewed      Diet Order:  Diet regular Room service appropriate? Yes; Fluid consistency: Thin  EDUCATION NEEDS:   Education needs have been addressed  Skin:  Skin Assessment: Skin Integrity Issues: Skin Integrity Issues:: Stage I Stage I: buttocks  Last BM:  05/11/17  Height:   Ht Readings from Last 1 Encounters:  04/25/17 5\' 11"  (1.803 m)    Weight:   Wt Readings from Last 1 Encounters:  05/12/17 163 lb (73.9 kg)    Ideal Body Weight:  78.2 kg  BMI:  Body mass index is 22.73 kg/m.  Estimated Nutritional Needs:   Kcal:  2000-2200 kcal/day  Protein:  100-105 g/day  Fluid:  >2 L/day  Mariana Single RD, LDN Clinical Nutrition Pager # [phone]

## 2017-05-13 NOTE — Progress Notes (Signed)
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 Patient being currently managed for multiofocal pneumonia (streptococcal) and left-sided Large hydropneumothorax with depression. Patient underwent placement of left pigtail on 05/08/16 with large amount of drainage.   Subjective: Denies chest pain, no abdominal pain, no nausea or vomiting.  Continued to be slightly short of breath on exertion but feeling a whole lot better now.  He has come down to dose 8 L of high flow nasal cannula currently.  Pretty calm and in no distress.  Continued to report improvement in his appetite and expressed desire to initiate rehabilitation.  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)   Diabetes mellitus type 2 in nonobese (HCC)   Acute respiratory failure with hypoxia and hypercapnia (HCC)   Severe sepsis (HCC)   Malnutrition of moderate degree   Pressure injury of skin   Empyema lung (HCC)   New onset a-fib (HCC)   AKI (acute kidney injury) ([place])   Non-small cell cancer of left lung (HCC)   Goals of care, counseling/discussion   Palliative care encounter   Streptococcal pneumonia ([place])   Malignant neoplasm of left lung ([place])  Acute respiratory failure with hypoxia and hypercarbia / Sepsis secondary to streptococcal multifocal pneumonia / Empyema w +/- malignant effusion  - CXR on admission showed left perihilar soft tissue mass consistent with known malignancy. - Pt is status post thoracentesis 12/23 - 1.4 L fluid removed; thoracentesis 12/27 -  2.3 L fluid, 05/06/17 0.5L removed fluid analysis c/w bacteria empyema.  -s/p Pigtail chest tube placement 05/08/16 by IR - high output on pleur-evac  -Blood cx negative  -Pleural fluid cx grew strep viridans; continue abx's as per ID -will follow PCCM recommendations  -Per ID recommendation continue Rocephin while inpatient, upon discharge can change to oral formulation to complete 28 days given empyema   -daily cxr started 05/10/17; no significant changes appreciated. -Still requiring high O2 supplement 8-10 L sat 99%; will weaned as tolerated  -Not previously on O2 supplement at home   Leukocytosis: due to above -WBC normalized  -patient has remained afebrile -continue current abx's  Edema - continues to improve   -Hypoproteinemia, 3rd spacing. -Continue lasix IV 20 mg - remains with good urine output  -Monitor Cr intermittently while on Lasix  -continue daily weights and strict intake and output   Metastatic stage IV lung cancer -Follows with Dr. [name] -continue outpatient follow up  Afib with RVR - stable  -rate controlled -CHA2DS2-VASc Score 2 -will continue amiodarone and metoprolol -cards has seen the patient and recommended against anticoagulation given new afib and significant commorbidities (see 12/27 cards c/s note)  AKI - Resolved  -probably due to sepsis -follow Cr trend    Diabetes type 2 without complications- Stable  -continue SSI  -Continue to follow CVG and further adjust hypoglycemic regimen as needed.  Moderate protein calorie malnutrition -In the setting of chronic illness -Follow nutritional service rec's -Patient expressed increased appetite and eating more.  Skin pressure injury, stage 1(left armpit) / Stage 2 sacral ulcer -will follow rec's  from wound care service -continue physical preventive measures.  Urinary retention  -Unclear etiology -Patient has failed voiding trial; bladder scan with more than 900 cc urine  accumulated. -Will replace Foley catheter.   -Patient will need outpatient follow-up with urology service.  Plan is to discharge patient with catheter in place. -Normal PSA  -renal US done on 1/5 demonstrated no hydronephrosis or obstructive uropathy.    DVT prophylaxis: Lovenox sq daily Code Status: Full Code  Family Communication: None at bedside  Disposition Plan: plans is for patient to go to SNF when medically stable.  Will continue to follow PCCM and palliative care rec's.  Consultants:   IR  PCCM  ID   Palliative   Procedures:   Thoracentesis 12/23 and 12/27   Antimicrobials: Anti-infectives (From admission, onward)   Start     Dose/Rate Route Frequency Ordered Stop   04/29/17 1200  cefTRIAXone (ROCEPHIN) 2 g in dextrose 5 % 50 mL IVPB     2 g 100 mL/hr over 30 Minutes Intravenous Every 24 hours 04/29/17 1100     04/25/17 1200  vancomycin (VANCOCIN) IVPB 750 mg/150 ml premix  Status:  Discontinued     750 mg 150 mL/hr over 60 Minutes Intravenous Every 12 hours 04/25/17 0835 04/27/17 1406   04/25/17 1000  ceFEPIme (MAXIPIME) 1 g in dextrose 5 % 50 mL IVPB  Status:  Discontinued     1 g 100 mL/hr over 30 Minutes Intravenous Every 8 hours 04/25/17 0835 04/29/17 1100   04/25/17 0015  ceFEPIme (MAXIPIME) 2 g in dextrose 5 % 50 mL IVPB     2 g 100 mL/hr over 30 Minutes Intravenous  Once 04/25/17 0001 04/25/17 0119   04/25/17 0015  vancomycin (VANCOCIN) IVPB 1000 mg/200 mL premix     1,000 mg 200 mL/hr over 60 Minutes Intravenous  Once 04/25/17 0001 04/25/17 0259   04/25/17 0015  azithromycin (ZITHROMAX) 500 mg in dextrose 5 % 250 mL IVPB  Status:  Discontinued     500 mg 250 mL/hr over 60 Minutes Intravenous Daily at bedtime 04/25/17 0009 04/29/17 1100      Objective: Vitals:   05/13/17 0841 05/13/17 1012 05/13/17 1100 05/13/17 1453  BP:  (!) 116/51 123/60 (!) 113/47  Pulse:  88 80 73  Resp:    19  Temp:    98.3 F (36.8 C)  TempSrc:    Oral  SpO2: 96% 98%   97%  Weight:      Height:        Intake/Output Summary (Last 24 hours) at 05/13/2017 1900 Last data filed at 05/13/2017 1800 Gross per 24 hour  Intake 580 ml  Output 3100 ml  Net -2520 ml   Filed Weights   05/10/17 0436 05/11/17 0439 05/12/17 0513  Weight: 76.2 kg (168 lb) 77.2 kg (170 lb 3.1 oz) 73.9 kg (163 lb)    Examination: General: In no acute distress.  Patient reports breathing continue to be stable and in fact is using less oxygen supplementation (even is still high flow nasal cannula required).  He denies chest pain, abdominal pain, nausea, vomiting or any other acute distress.  Patient has no being able to urinate and bladder scan demonstrated over 900 cc of fluid retained in his bladder.  Rest of his physical exam unchanged from examination on 05/12/17; see below for details:. Cardiovascular: RRR, no rubs or gallops, positive soft SEM, no JVD.  Respiratory: improved air movement, no wheezing, drain in place, no air leak.  Abdominal: soft, NT, ND, positive BS Extremities: trace to 1+ edema appreciated, no cyanosis   Data Reviewed: I have personally reviewed following labs and imaging studies  CBC: Recent Labs  Lab 05/07/17 0411 05/08/17 0422 05/09/17 0730 05/10/17 0458 05/11/17 0738  WBC 11.9* 14.7* 8.6 6.7 9.0  NEUTROABS 10.7* 13.3* 7.6 5.8  --   HGB 11.0* 11.2* 10.8* 11.0* 10.8*  HCT 35.9* 36.1* 34.8* 36.2* 34.1*  MCV 94.5 96.3 95.3 94.5 94.2  PLT 202 193 152 154 094*   Basic Metabolic Panel: Recent Labs  Lab 05/07/17 0411 05/08/17 0422 05/09/17 0730 05/10/17 0458 05/11/17 0451 05/12/17 1219  NA  --  139 138 135 138 135  K  --  3.6 3.2* 2.3* 2.6* 3.4*  CL  --  98* 95* 90* 91* 92*  CO2  --  35* 37* 39* 39* 34*  GLUCOSE  --  139* 202* 308* 220* 347*  BUN  --  18 17 17 16 16   CREATININE  --  0.36* 0.40* 0.40* 0.39* 0.47*  CALCIUM  --  8.1* 7.5* 7.4* 7.8* 7.5*  MG 1.8 1.8 1.7 1.7 1.9  --   PHOS 3.0 2.7  --   --   --   --    GFR: Estimated Creatinine  Clearance: 80.8 mL/min (A) (by C-G formula based on SCr of 0.47 mg/dL (L)).   Liver Function Tests: Recent Labs  Lab 05/09/17 0730 05/10/17 0458  AST 20  --   ALT 21  --   ALKPHOS 94  --   BILITOT 0.6  --   PROT 4.2*  --   ALBUMIN 1.6* 1.8*   Coagulation Profile: Recent Labs  Lab 05/08/17 0422  INR 1.28   CBG: Recent Labs  Lab 05/12/17 1638 05/12/17 2108 05/13/17 0739 05/13/17 1146 05/13/17 1719  GLUCAP 207* 176* 229* 252* 152*    Recent Results (from the past 240 hour(s))  Acid Fast Smear (AFB)     Status: None   Collection Time: 05/06/17  2:58 PM  Result Value Ref Range Status   AFB Specimen Processing Concentration  Final   Acid Fast Smear Negative  Final    Comment: (NOTE) Performed At: [facility] [address] [place], [place] [id number] Rush Farmer MD QH:[id number]    Source (AFB) PLEURAL  Final    Comment: LEFT  Culture, body fluid-bottle     Status: None   Collection Time: 05/06/17  2:58 PM  Result Value Ref Range Status   Specimen Description PLEURAL LEFT  Final   Special Requests NONE  Final   Culture   Final    NO GROWTH 5 DAYS Performed at [facility], [place] [address], [place], [place] [address]    Report Status 05/11/2017 FINAL  Final  Gram stain     Status: None   Collection Time: 05/06/17  2:58 PM  Result Value Ref Range Status   Specimen Description PLEURAL LEFT  Final   Special Requests NONE  Final   Gram Stain   Final    MODERATE WBC PRESENT, PREDOMINANTLY PMN NO ORGANISMS SEEN Performed at [facility], [place] [address] [place], [place] [address]    Report Status 05/06/2017 FINAL  Final     Radiology Studies: Dg Chest Port 1 View  Result Date: 05/13/2017 CLINICAL DATA:  Pleural effusion.  Chest tube. EXAM: PORTABLE CHEST 1 VIEW COMPARISON:  Multiple priors. FINDINGS: Pigtail catheter LEFT hemithorax and associated LEFT pneumothorax appear similar to yesterday's radiograph.  There is poor reinflation of the  LEFT lung. Suspected RIGHT lower chest wall skin fold, although a tiny lateral pneumothorax cannot be excluded. No concerning extrapleural air over the apex. Unchanged cardiomediastinal silhouette. IMPRESSION: Poor re-expansion of the LEFT lung. Overall stable LEFT hydropneumothorax. Skin fold versus tiny RIGHT lateral pneumothorax, continued surveillance warranted. Electronically Signed   By: Staci Righter M.D.   On: 05/13/2017 07:08    Scheduled Meds: . amiodarone  400 mg Oral BID  . budesonide (PULMICORT) nebulizer solution  0.25 mg Nebulization BID  . chlorhexidine  15 mL Mouth Rinse BID  . enoxaparin (LOVENOX) injection  40 mg Subcutaneous QHS  . feeding supplement  1 Container Oral BID BM  . furosemide  20 mg Intravenous Daily  . Gerhardt's butt cream   Topical TID  . insulin aspart  0-9 Units Subcutaneous TID WC  . ipratropium  0.5 mg Nebulization TID  . levalbuterol  1.25 mg Nebulization TID  . mouth rinse  15 mL Mouth Rinse q12n4p  . metoprolol tartrate  25 mg Oral BID   Continuous Infusions: . cefTRIAXone (ROCEPHIN)  IV Stopped (05/13/17 1713)     LOS: 18 days    Time spent: Total of  20 minutes spent with pt, greater than 50% of which was spent in discussion of  treatment, counseling and coordination of care   Barton Dubois, MD Pager: [phone]  If 7PM-7AM, please contact night-coverage [email or web address] 05/13/2017, 7:00 PM

## 2017-05-13 NOTE — Progress Notes (Signed)
Referring Physician(s): Ramaswamy,M  Supervising Physician: Markus Daft  Patient Status:  [facility] - In-pt  Chief Complaint:  Left chest empyema, recurrent pleural effusion  Subjective: Pt without new c/o; resting quietly   Allergies: Patient has no known allergies.  Medications: Prior to Admission medications   Medication Sig Start Date End Date Taking? Authorizing Provider  acetaminophen (TYLENOL) 500 MG tablet Take 1,000 mg by mouth every 6 (six) hours as needed for moderate pain.   Yes [provider]  albuterol (PROVENTIL HFA;VENTOLIN HFA) 108 (90 Base) MCG/ACT inhaler Inhale 1 puff every 6 (six) hours as needed into the lungs for wheezing or shortness of breath.   Yes [provider]  loperamide (IMODIUM A-D) 2 MG tablet Take 2 mg by mouth 3 (three) times daily as needed for diarrhea or loose stools.   Yes [provider]  metFORMIN (GLUCOPHAGE) 500 MG tablet Take 500 mg 2 (two) times daily with a meal by mouth.    Yes [provider]  methylPREDNISolone (MEDROL DOSEPAK) 4 MG TBPK tablet Take per package instructions Patient taking differently: as directed. Take 6 tablets on Day 1, Take 5 tablets on Day 2, Take 4 tablets on Day 3, Take 3 tablets on Day 4, Take 2 tablets on Day 5 and take 1 tablet on Day 6. 04/24/17  Yes Curt Bears, MD  prochlorperazine (COMPAZINE) 10 MG tablet Take 1 tablet (10 mg total) by mouth every 6 (six) hours as needed for nausea or vomiting. 04/24/17  Yes Kyung Rudd, MD  ACCU-CHEK FASTCLIX LANCETS [place]  03/19/17   [provider]  ACCU-CHEK GUIDE test strip USE TO CHECK BLOOD SUGAR BID 03/23/17   [provider]  cetirizine (ZYRTEC) 10 MG tablet Take 10 mg daily by mouth. ALLERTEC    [provider]  nystatin (MYCOSTATIN) 100000 UNIT/ML suspension SWISH AND SWALLOW 5 MLS PO  AC AND QHS FOR 7 DAYS 03/31/17   [provider]     Vital Signs: BP (!) 113/47 (BP Location: Right  Arm)   Pulse 73   Temp 98.3 F (36.8 C) (Oral)   Resp 19   Ht 5\' 11"  (1.803 m)   Wt 163 lb (73.9 kg)   SpO2 97%   BMI 22.73 kg/m   Physical Exam awake/alert; left chest drain intact, output 725 cc of yellow fluid; all connections secure  Imaging: Dg Chest 1 View  Result Date: 05/10/2017 CLINICAL DATA:  77 year old male with left-sided chest tube EXAM: CHEST 1 VIEW COMPARISON:  Prior chest x-ray obtained yesterday; CT scan of the chest 05/06/2017 FINDINGS: Stable position of left basilar percutaneous thoracostomy tube. Persistent large left-sided loculated pneumothorax. Perhaps minimal improvement in the basilar component. The entire left lung remains atelectatic. Stable moderate layering right-sided pleural effusion with associated right basilar atelectasis. Unchanged cardiac mediastinal contours. Atherosclerotic calcifications in the transverse aorta. Background pulmonary emphysema. No acute osseous abnormality. IMPRESSION: 1. Stable chest x-ray with left basilar chest tube in place. Minimal change in the large left-sided loculated pneumothorax which is likely ex vacuo in nature and due to failure of expansion of the atelectatic left lung. 2. Stable moderate layering right pleural effusion. Electronically Signed   By: Jacqulynn Cadet M.D.   On: 05/10/2017 12:33   Dg Chest Port 1 View  Result Date: 05/13/2017 CLINICAL DATA:  Pleural effusion.  Chest tube. EXAM: PORTABLE CHEST 1 VIEW COMPARISON:  Multiple priors. FINDINGS: Pigtail catheter LEFT hemithorax and associated LEFT pneumothorax appear similar to yesterday's radiograph.  There is poor reinflation of the LEFT lung. Suspected RIGHT lower chest wall skin fold, although a tiny lateral pneumothorax cannot be excluded. No concerning extrapleural air over the apex. Unchanged cardiomediastinal silhouette. IMPRESSION: Poor re-expansion of the LEFT lung. Overall stable LEFT hydropneumothorax. Skin fold versus tiny RIGHT lateral pneumothorax,  continued surveillance warranted. Electronically Signed   By: Staci Righter M.D.   On: 05/13/2017 07:08   Dg Chest Port 1 View  Result Date: 05/11/2017 CLINICAL DATA:  Follow-up chest tube EXAM: PORTABLE CHEST 1 VIEW COMPARISON:  05/10/2017 FINDINGS: Cardiac shadow is enlarged but stable. Small bore chest tube is again seen on the left. Large left hydropneumothorax is again noted and stable. No significant aeration of the left lung is seen. Right lung is well aerated with evidence of small right pleural effusion. This is stable from the prior exam. No new focal abnormality is seen. IMPRESSION: Overall stable appearance of the chest when compared with the prior study. Electronically Signed   By: Inez Catalina M.D.   On: 05/11/2017 07:31    Labs:  CBC: Recent Labs    05/08/17 0422 05/09/17 0730 05/10/17 0458 05/11/17 0738  WBC 14.7* 8.6 6.7 9.0  HGB 11.2* 10.8* 11.0* 10.8*  HCT 36.1* 34.8* 36.2* 34.1*  PLT 193 152 154 148*    COAGS: Recent Labs    05/08/17 0422  INR 1.28    BMP: Recent Labs    05/09/17 0730 05/10/17 0458 05/11/17 0451 05/12/17 1219  NA 138 135 138 135  K 3.2* 2.3* 2.6* 3.4*  CL 95* 90* 91* 92*  CO2 37* 39* 39* 34*  GLUCOSE 202* 308* 220* 347*  BUN 17 17 16 16   CALCIUM 7.5* 7.4* 7.8* 7.5*  CREATININE 0.40* 0.40* 0.39* 0.47*  GFRNONAA >60 >60 >60 >60  GFRAA >60 >60 >60 >60    LIVER FUNCTION TESTS: Recent Labs    04/24/17 2054 04/26/17 0311 05/05/17 0333 05/06/17 0316 05/09/17 0730 05/10/17 0458  BILITOT 0.9 0.7  --  0.6 0.6  --   AST 26 25  --  16 20  --   ALT 22 22  --  32 21  --   ALKPHOS 132* 79  --  184* 94  --   PROT 7.4 5.7*  --  4.8* 4.2*  --   ALBUMIN 3.6 2.5* 1.8* 1.7* 1.6* 1.8*    Assessment and Plan: Pt with hx stage IV NSC lung ca, recurrent left pleural effusion/chronic hydroptx/empyema; s/p left chest drain 1/4; afebrile; fluid cx - neg; output still significant; CXR today with no sig change/persistent left hydroptx; await  further plans per CCM     Electronically Signed: D. Rowe Robert, PA-C 05/13/2017, 4:08 PM   I spent a total of 15 minutes at the the patient's bedside AND on the patient's hospital floor or unit, greater than 50% of which was counseling/coordinating care for left chest drain    Patient ID: Maurice Tyler, male   DOB: 20-Jun-1940, 77 y.o.   MRN: [id number]

## 2017-05-13 NOTE — Progress Notes (Signed)
Palliative:  I met again today with Maurice Tyler. He continues to be very friendly. He continues to endorse that he is feeling better with improved appetite and energy. However, he did not sleep well last night. Consider trazodone 25 mg qhs as needed with continued insomnia.   He denies SOB at this time and is happy to be done to 7L oxygen. We discussed chest xray as showing no change. He ponders a moment and then replies that he is tired and would like to rest. Maurice Tyler welcomes me and conversation but does avoid any discussion regarding prognosis or GOC.   15 min  Vinie Sill, NP Palliative Medicine Team Pager # [phone] (M-F 8a-5p) Team Phone # [phone] (Nights/Weekends)

## 2017-05-14 ENCOUNTER — Ambulatory Visit: Payer: Medicare Other

## 2017-05-14 ENCOUNTER — Ambulatory Visit
Admission: RE | Admit: 2017-05-14 | Discharge: 2017-05-14 | Disposition: A | Discharge status: Home / Self Care | Payer: Medicare Other | Source: Ambulatory Visit | Attending: Radiation Oncology | Admitting: Radiation Oncology

## 2017-05-14 LAB — GLUCOSE, BODY FLUID OTHER: Glucose, Body Fluid Other: <2 mg/dL

## 2017-05-14 LAB — CBC
HEMATOCRIT: 30.4 % — AB (ref 39.0–52.0)
HEMOGLOBIN: 9.6 g/dL — AB (ref 13.0–17.0)
MCH: 29.7 pg (ref 26.0–34.0)
MCHC: 31.6 g/dL (ref 30.0–36.0)
MCV: 94.1 fL (ref 78.0–100.0)
Platelets: 159 10*3/uL (ref 150–400)
RBC: 3.23 MIL/uL — ABNORMAL LOW (ref 4.22–5.81)
RDW: 14.1 % (ref 11.5–15.5)
WBC: 7.7 10*3/uL (ref 4.0–10.5)

## 2017-05-14 LAB — BASIC METABOLIC PANEL
Anion gap: 8 (ref 5–15)
BUN: 19 mg/dL (ref 6–20)
CHLORIDE: 91 mmol/L — AB (ref 101–111)
CO2: 36 mmol/L — AB (ref 22–32)
Calcium: 7.7 mg/dL — ABNORMAL LOW (ref 8.9–10.3)
Creatinine, Ser: 0.49 mg/dL — ABNORMAL LOW (ref 0.61–1.24)
GFR calc non Af Amer: >60 mL/min (ref >60)
GLUCOSE: 223 mg/dL — AB (ref 65–99)
Potassium: 3.3 mmol/L — ABNORMAL LOW (ref 3.5–5.1)
Sodium: 135 mmol/L (ref 135–145)

## 2017-05-14 LAB — GLUCOSE, CAPILLARY
GLUCOSE-CAPILLARY: 191 mg/dL — AB (ref 65–99)
Glucose-Capillary: 187 mg/dL — ABNORMAL HIGH (ref 65–99)
Glucose-Capillary: 214 mg/dL — ABNORMAL HIGH (ref 65–99)
Glucose-Capillary: 175 mg/dL — ABNORMAL HIGH (ref 65–99)

## 2017-05-14 MED ORDER — POTASSIUM CHLORIDE CRYS ER 20 MEQ PO TBCR
30.0000 meq | EXTENDED_RELEASE_TABLET | Freq: Every day | ORAL | Status: DC
  Administered 2017-05-14 – 2017-05-17 (×4): 30 meq via ORAL
  Filled 2017-05-14 (×6): qty 1

## 2017-05-14 NOTE — Progress Notes (Signed)
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 Patient being currently managed for multiofocal pneumonia (streptococcal) and left-sided Large hydropneumothorax with depression. Patient underwent placement of left pigtail on 05/08/16 with large amount of drainage.   Subjective: No CP, no nausea, no vomiting and no acute distress. Reports feeling better and with stable breathing. Still requiring high flow oxygen supplementation and with high output from chest tube.  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)   Diabetes mellitus type 2 in nonobese (HCC)   Acute respiratory failure with hypoxia and hypercapnia (HCC)   Severe sepsis (HCC)   Malnutrition of moderate degree   Pressure injury of skin   Empyema lung (HCC)   New onset a-fib (HCC)   AKI (acute kidney injury) ([place])   Non-small cell cancer of left lung (HCC)   Goals of care, counseling/discussion   Palliative care encounter   Streptococcal pneumonia ([place])   Malignant neoplasm of left lung ([place])  Acute respiratory failure with hypoxia and hypercarbia / Sepsis secondary to streptococcal multifocal pneumonia / Empyema w +/- malignant effusion  - CXR on admission showed left perihilar soft tissue mass consistent with known malignancy. - Pt is status post thoracentesis 12/23 - 1.4 L fluid removed; thoracentesis 12/27 - 2.3 L fluid, 05/06/17 0.5L removed fluid analysis c/w bacteria empyema.  -s/p Pigtail chest tube placement 05/08/16 by IR - high output on pleur-evac  -Blood cx  negative  -Pleural fluid cx grew strep viridans; continue abx's as per ID -will follow PCCM recommendations  -Per ID recommendation continue Rocephin while inpatient, upon discharge can change to oral formulation to complete 28 days given empyema   -daily cxr started 05/10/17; no significant changes appreciated. -Still requiring high O2 supplement 8-10 L sat 99%; will weaned as tolerated  -Not previously on O2 supplement at home   Leukocytosis: due to above -WBC normalized  -patient has remained afebrile -continue current abx's  Edema -    -Hypoproteinemia, 3rd spacing. -Continue lasix IV 20 mg - remains with good urine output  -Monitor Cr intermittently while on Lasix  -continue daily weights and strict intake and output  -overall swelling is well controlled/resolving.  Metastatic stage IV lung cancer -Follows with Dr. [name] -continue outpatient follow up -per patient plan is for no further chemotherapy  Afib with RVR - stable  -rate controlled -CHA2DS2-VASc Score 2 -will continue amiodarone and metoprolol -cards has seen the patient and recommended against anticoagulation given new afib and significant commorbidities (see 12/27 cards c/s note)  AKI - Resolved  -probably due to sepsis -follow Cr trend intermittently    Diabetes type 2 without complications- Stable  -continue SSI  -Continue to follow CVG and further adjust hypoglycemic regimen as needed.  Moderate protein calorie malnutrition -In the setting of chronic illness -Follow nutritional service rec's -Patient expressed increased appetite and eating more. -continue feeding supplements   Skin pressure injury, stage 1(left armpit) / Stage 2 sacral ulcer -will follow rec's from wound care service -continue physical preventive measures.  Urinary retention  -Unclear etiology -Patient has failed  voiding trial; bladder scan with more than 900 cc urine accumulated on 05/13/17 -foley replaced at that  time -no issues and good urine output seen since then.. -Normal PSA  -renal US done on 1/5 demonstrated no hydronephrosis or obstructive uropathy.    DVT prophylaxis: Lovenox sq daily Code Status: Full Code  Family Communication: None at bedside  Disposition Plan: plans is for patient to go to SNF when medically stable.  Will continue to follow PCCM and palliative care rec's.  Consultants:   IR  PCCM  ID   Palliative   Procedures:   Thoracentesis 12/23 and 12/27   Antimicrobials: Anti-infectives (From admission, onward)   Start     Dose/Rate Route Frequency Ordered Stop   04/29/17 1200  cefTRIAXone (ROCEPHIN) 2 g in dextrose 5 % 50 mL IVPB     2 g 100 mL/hr over 30 Minutes Intravenous Every 24 hours 04/29/17 1100     04/25/17 1200  vancomycin (VANCOCIN) IVPB 750 mg/150 ml premix  Status:  Discontinued     750 mg 150 mL/hr over 60 Minutes Intravenous Every 12 hours 04/25/17 0835 04/27/17 1406   04/25/17 1000  ceFEPIme (MAXIPIME) 1 g in dextrose 5 % 50 mL IVPB  Status:  Discontinued     1 g 100 mL/hr over 30 Minutes Intravenous Every 8 hours 04/25/17 0835 04/29/17 1100   04/25/17 0015  ceFEPIme (MAXIPIME) 2 g in dextrose 5 % 50 mL IVPB     2 g 100 mL/hr over 30 Minutes Intravenous  Once 04/25/17 0001 04/25/17 0119   04/25/17 0015  vancomycin (VANCOCIN) IVPB 1000 mg/200 mL premix     1,000 mg 200 mL/hr over 60 Minutes Intravenous  Once 04/25/17 0001 04/25/17 0259   04/25/17 0015  azithromycin (ZITHROMAX) 500 mg in dextrose 5 % 250 mL IVPB  Status:  Discontinued     500 mg 250 mL/hr over 60 Minutes Intravenous Daily at bedtime 04/25/17 0009 04/29/17 1100      Objective: Vitals:   05/14/17 1010 05/14/17 1100 05/14/17 1300 05/14/17 2043  BP:  111/60 (!) 101/55   Pulse:  80 66   Resp:   20   Temp:   97.7 F (36.5 C)   TempSrc:   Oral   SpO2: 96%  100% 98%  Weight:      Height:        Intake/Output Summary (Last 24 hours) at 05/14/2017 2112 Last data filed at  05/14/2017 1900 Gross per 24 hour  Intake 1010 ml  Output 2175 ml  Net -1165 ml   Filed Weights   05/11/17 0439 05/12/17 0513 05/14/17 0500  Weight: 77.2 kg (170 lb 3.1 oz) 73.9 kg (163 lb) 71.2 kg (157 lb)    Examination: General: afebrile, deneis pain and reports breathing is stable. Patient is in no distress and expressed improved in appetite. Foley cathter replace w/o any problems. Continue to have high output from his chest tube. Cardiovascular: RRR, positive SEM, no JVD. No rubs, no gallops Respiratory: good air movement overall, except for decrease BS at left bases; no wheezing, no frank crackles. Abdominal: soft, NT, ND, positive BS Extremities: trace edema bilaterally, no cyanosis   Data Reviewed: I have personally reviewed following labs and imaging studies  CBC: Recent Labs  Lab 05/08/17 0422 05/09/17 0730 05/10/17 0458 05/11/17 0738 05/14/17 0405  WBC 14.7* 8.6 6.7 9.0 7.7  NEUTROABS 13.3* 7.6 5.8  --   --   HGB 11.2* 10.8* 11.0* 10.8* 9.6*  HCT  36.1* 34.8* 36.2* 34.1* 30.4*  MCV 96.3 95.3 94.5 94.2 94.1  PLT 193 152 154 148* 017   Basic Metabolic Panel: Recent Labs  Lab 05/08/17 0422 05/09/17 0730 05/10/17 0458 05/11/17 0451 05/12/17 1219 05/14/17 0405  NA 139 138 135 138 135 135  K 3.6 3.2* 2.3* 2.6* 3.4* 3.3*  CL 98* 95* 90* 91* 92* 91*  CO2 35* 37* 39* 39* 34* 36*  GLUCOSE 139* 202* 308* 220* 347* 223*  BUN 18 17 17 16 16 19   CREATININE 0.36* 0.40* 0.40* 0.39* 0.47* 0.49*  CALCIUM 8.1* 7.5* 7.4* 7.8* 7.5* 7.7*  MG 1.8 1.7 1.7 1.9  --   --   PHOS 2.7  --   --   --   --   --    GFR: Estimated Creatinine Clearance: 77.9 mL/min (A) (by C-G formula based on SCr of 0.49 mg/dL (L)).   Liver Function Tests: Recent Labs  Lab 05/09/17 0730 05/10/17 0458  AST 20  --   ALT 21  --   ALKPHOS 94  --   BILITOT 0.6  --   PROT 4.2*  --   ALBUMIN 1.6* 1.8*   Coagulation Profile: Recent Labs  Lab 05/08/17 0422  INR 1.28   CBG: Recent Labs  Lab  05/13/17 1719 05/13/17 2034 05/14/17 0737 05/14/17 1142 05/14/17 1654  GLUCAP 152* 178* 187* 214* 175*    Recent Results (from the past 240 hour(s))  Acid Fast Smear (AFB)     Status: None   Collection Time: 05/06/17  2:58 PM  Result Value Ref Range Status   AFB Specimen Processing Concentration  Final   Acid Fast Smear Negative  Final    Comment: (NOTE) Performed At: [facility] [address] [place], [place] [id number] Rush Farmer MD PO:[id number]    Source (AFB) PLEURAL  Final    Comment: LEFT  Culture, body fluid-bottle     Status: None   Collection Time: 05/06/17  2:58 PM  Result Value Ref Range Status   Specimen Description PLEURAL LEFT  Final   Special Requests NONE  Final   Culture   Final    NO GROWTH 5 DAYS Performed at [facility], [place] [address], [place], [place] [address]    Report Status 05/11/2017 FINAL  Final  Gram stain     Status: None   Collection Time: 05/06/17  2:58 PM  Result Value Ref Range Status   Specimen Description PLEURAL LEFT  Final   Special Requests NONE  Final   Gram Stain   Final    MODERATE WBC PRESENT, PREDOMINANTLY PMN NO ORGANISMS SEEN Performed at [facility], [place] [address], [place], [place] [address]    Report Status 05/06/2017 FINAL  Final     Radiology Studies: Dg Chest Port 1 View  Result Date: 05/13/2017 CLINICAL DATA:  Pleural effusion.  Chest tube. EXAM: PORTABLE CHEST 1 VIEW COMPARISON:  Multiple priors. FINDINGS: Pigtail catheter LEFT hemithorax and associated LEFT pneumothorax appear similar to yesterday's radiograph. There is poor reinflation of the LEFT lung. Suspected RIGHT lower chest wall skin fold, although a tiny lateral pneumothorax cannot be excluded. No concerning extrapleural air over the apex. Unchanged cardiomediastinal silhouette. IMPRESSION: Poor re-expansion of the LEFT lung. Overall stable LEFT hydropneumothorax. Skin fold versus tiny RIGHT lateral pneumothorax, continued  surveillance warranted. Electronically Signed   By: Staci Righter M.D.   On: 05/13/2017 07:08    Scheduled Meds: . amiodarone  400 mg Oral  BID  . budesonide (PULMICORT) nebulizer solution  0.25 mg Nebulization BID  . chlorhexidine  15 mL Mouth Rinse BID  . enoxaparin (LOVENOX) injection  40 mg Subcutaneous QHS  . feeding supplement  1 Container Oral BID BM  . furosemide  20 mg Intravenous Daily  . Gerhardt's butt cream   Topical TID  . insulin aspart  0-9 Units Subcutaneous TID WC  . ipratropium  0.5 mg Nebulization TID  . levalbuterol  1.25 mg Nebulization TID  . mouth rinse  15 mL Mouth Rinse q12n4p  . metoprolol tartrate  25 mg Oral BID   Continuous Infusions: . cefTRIAXone (ROCEPHIN)  IV Stopped (05/14/17 1603)     LOS: 19 days    Time spent: Total of  20 minutes spent with pt, greater than 50% of which was spent in discussion of  treatment, counseling and coordination of care   Barton [facility]ois, MD Pager: [phone]  If 7PM-7AM, please contact night-coverage [email or web address] 05/14/2017, 9:12 PM

## 2017-05-14 NOTE — Progress Notes (Signed)
[facility] PCCM  Subjective:  Says that he feels a bit better.  Objective: Vitals:   05/14/17 0610 05/14/17 1010 05/14/17 1100 05/14/17 1300  BP: (!) 101/54  111/60 (!) 101/55  Pulse: 73  80 66  Resp: 20   20  Temp: 98.4 F (36.9 C)   97.7 F (36.5 C)  TempSrc: Oral   Oral  SpO2: 97% 96%  100%  Weight:      Height:       6L [place]  General:  Resting comfortably in bed HENT: NCAT temporal wasting PULM: No air movement on L, chest tube in place, normal effort CV: RRR, no mgr GI: BS+, soft, nontender MSK: normal bulk and tone Neuro: awake, alert, no distress, MAEW   Chest x-ray images reviewed showing that the pigtail chest drain is still in place, pneumothorax on left, mass on left  Impression: Hydropneumothorax Lung entrapment Empyema Strep pneumonia Non-small cell lung cancer  Discussion/Plan: I am pleased that Maurice Tyler oxygenation has improved and in general he says he is feeling better.  I think this reflects improvement in his pneumonia.  However, the lung entrapment on the left remains a difficult problem to deal with.  I sent a fluid glucose yesterday which was still less than 20 which is highly suspicious for persistent bacterial activity in the fluid.  So I think placing a Pleurx drain is a bad idea, further pleurodesis could also be problematic if there is still active infection in the pleural space.  There is no good answer here.  I discussed this with him at length.  I will also discuss with thoracic surgical oncology.  Our goal is palliation.  Roselie Awkward, MD [place] PCCM Pager: [phone] Cell: [phone] After 3pm or if no response, call [phone]

## 2017-05-14 NOTE — Progress Notes (Signed)
CSW following to assist with discharge planning to SNF. Patient currently receiving high flow oxygen, patient will need to be weaned off of high flow oxygen to be appropriate for SNF. Patient has been provided with bed offers. CSW will continue to follow and assist with discharge planning.  Abundio Miu, [place] Social Worker [facility] Cell#: [phone]

## 2017-05-14 NOTE — Progress Notes (Signed)
Late entry 2230:  Pt refused to wear prevalon boots. RN educated pt on the importance of boots but pt still refused.

## 2017-05-15 ENCOUNTER — Ambulatory Visit
Admission: RE | Admit: 2017-05-15 | Discharge: 2017-05-15 | Disposition: A | Discharge status: Home / Self Care | Payer: Medicare Other | Source: Ambulatory Visit | Attending: Radiation Oncology | Admitting: Radiation Oncology

## 2017-05-15 ENCOUNTER — Ambulatory Visit: Payer: Medicare Other

## 2017-05-15 DIAGNOSIS — Z9689 Presence of other specified functional implants: Secondary | ICD-10-CM

## 2017-05-15 DIAGNOSIS — J9621 Acute and chronic respiratory failure with hypoxia: Secondary | ICD-10-CM

## 2017-05-15 DIAGNOSIS — J869 Pyothorax without fistula: Secondary | ICD-10-CM

## 2017-05-15 LAB — PH, BODY FLUID: PH, BODY FLUID: 6.6

## 2017-05-15 LAB — GLUCOSE, CAPILLARY
GLUCOSE-CAPILLARY: 225 mg/dL — AB (ref 65–99)
GLUCOSE-CAPILLARY: 260 mg/dL — AB (ref 65–99)
Glucose-Capillary: 162 mg/dL — ABNORMAL HIGH (ref 65–99)
Glucose-Capillary: 150 mg/dL — ABNORMAL HIGH (ref 65–99)

## 2017-05-15 MED ORDER — ENOXAPARIN SODIUM 40 MG/0.4ML ~~LOC~~ SOLN
40.0000 mg | SUBCUTANEOUS | Status: DC
  Administered 2017-05-18 – 2017-05-19 (×2): 40 mg via SUBCUTANEOUS
  Filled 2017-05-15 (×2): qty 0.4

## 2017-05-15 NOTE — Progress Notes (Signed)
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 Patient being currently managed for multiofocal pneumonia (streptococcal) and left-sided Large hydropneumothorax with depression. Patient underwent placement of left pigtail on 05/08/16 with large amount of drainage.   Subjective: No CP, no nausea, no vomiting and no acute distress. Reports feeling better and with stable breathing. Still requiring high flow oxygen supplementation and with high output from chest tube.  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)   Diabetes mellitus type 2 in nonobese (HCC)   Acute respiratory failure with hypoxia and hypercapnia (HCC)   Severe sepsis (HCC)   Malnutrition of moderate degree   Pressure injury of skin   Empyema lung (HCC)   New onset a-fib (HCC)   AKI (acute kidney injury) ([place])   Non-small cell cancer of left lung (HCC)   Goals of care, counseling/discussion   Palliative care encounter   Streptococcal pneumonia ([place])   Malignant neoplasm of left lung ([place])   Acute on chronic respiratory failure with hypoxia (HCC)   Chest tube in place   Empyema ([place])  Acute respiratory failure with hypoxia and hypercarbia / Sepsis secondary to streptococcal multifocal pneumonia / Empyema w +/- malignant effusion  - CXR on admission showed left perihilar soft tissue mass consistent with known malignancy. - Pt is status post thoracentesis 12/23 - 1.4 L fluid removed; thoracentesis 12/27 - 2.3 L fluid, 05/06/17 0.5L removed fluid analysis c/w bacteria  empyema.  -s/p Pigtail chest tube placement 05/08/16 by IR - high output on pleur-evac  -Blood cx negative  -Pleural fluid cx grew strep viridans; continue abx's as per ID -will follow PCCM recommendations; planning pleurex tube placement next week. -Per ID recommendation continue Rocephin while inpatient, upon discharge can change to oral formulation to complete 28 days given empyema   -Still requiring high O2 supplement 7 L sat 99%; will weaned as tolerated  -Not previously on O2 supplement at home   Leukocytosis: due to above -WBC normalized  -patient has remained afebrile -continue current abx's  Edema -    -Hypoproteinemia, 3rd spacing. -Continue lasix IV 20 mg - remains with good urine output  -Monitor Cr intermittently while on Lasix  -continue daily weights and strict intake and output  -overall swelling is well controlled/resolving.  Metastatic stage IV lung cancer -Follows with Dr. [name] -continue outpatient follow up -per patient plan is for no further chemotherapy  Afib with RVR - stable  -rate controlled -CHA2DS2-VASc Score 2 -will continue amiodarone and metoprolol -cards has seen the patient and recommended against anticoagulation given new afib and significant commorbidities (see 12/27 cards c/s note)  AKI - Resolved  -probably due to sepsis -follow Cr trend intermittently    Diabetes type 2 without complications- Stable  -continue SSI  -Continue to follow CVG and further adjust hypoglycemic regimen as needed.  Moderate protein calorie malnutrition -In the setting of chronic illness -Follow nutritional service rec's -Patient expressed increased appetite and eating more. -continue feeding supplements   Skin pressure injury, stage 1(left armpit) / Stage 2 sacral ulcer -will follow rec's  from wound care service -continue physical preventive measures.  Urinary retention  -Unclear etiology -Patient has failed voiding trial; bladder scan with  more than 900 cc urine accumulated on 05/13/17 -foley replaced at that time -no issues and good urine output seen since then.. -Normal PSA  -renal US done on 1/5 demonstrated no hydronephrosis or obstructive uropathy.    DVT prophylaxis: Lovenox sq daily Code Status: Full Code  Family Communication: None at bedside  Disposition Plan: plans is for patient to go to SNF when medically stable.  Will continue to follow PCCM and palliative care rec's.  Consultants:   IR  PCCM  ID   Palliative   Procedures:   Thoracentesis 12/23 and 12/27   Antimicrobials: Anti-infectives (From admission, onward)   Start     Dose/Rate Route Frequency Ordered Stop   04/29/17 1200  cefTRIAXone (ROCEPHIN) 2 g in dextrose 5 % 50 mL IVPB     2 g 100 mL/hr over 30 Minutes Intravenous Every 24 hours 04/29/17 1100     04/25/17 1200  vancomycin (VANCOCIN) IVPB 750 mg/150 ml premix  Status:  Discontinued     750 mg 150 mL/hr over 60 Minutes Intravenous Every 12 hours 04/25/17 0835 04/27/17 1406   04/25/17 1000  ceFEPIme (MAXIPIME) 1 g in dextrose 5 % 50 mL IVPB  Status:  Discontinued     1 g 100 mL/hr over 30 Minutes Intravenous Every 8 hours 04/25/17 0835 04/29/17 1100   04/25/17 0015  ceFEPIme (MAXIPIME) 2 g in dextrose 5 % 50 mL IVPB     2 g 100 mL/hr over 30 Minutes Intravenous  Once 04/25/17 0001 04/25/17 0119   04/25/17 0015  vancomycin (VANCOCIN) IVPB 1000 mg/200 mL premix     1,000 mg 200 mL/hr over 60 Minutes Intravenous  Once 04/25/17 0001 04/25/17 0259   04/25/17 0015  azithromycin (ZITHROMAX) 500 mg in dextrose 5 % 250 mL IVPB  Status:  Discontinued     500 mg 250 mL/hr over 60 Minutes Intravenous Daily at bedtime 04/25/17 0009 04/29/17 1100      Objective: Vitals:   05/14/17 2134 05/15/17 0506 05/15/17 1424 05/15/17 1432  BP: (!) 102/50 91/68  (!) 96/44  Pulse: 82 86  66  Resp: 20 20  20   Temp: 98.1 F (36.7 C) 98.7 F (37.1 C)  98.2 F (36.8 C)  TempSrc: Oral Axillary  Oral    SpO2: 99% 98% 97% 97%  Weight:  70.3 kg (155 lb)    Height:        Intake/Output Summary (Last 24 hours) at 05/15/2017 1503 Last data filed at 05/15/2017 1300 Gross per 24 hour  Intake 600 ml  Output 1630 ml  Net -1030 ml   Filed Weights   05/12/17 0513 05/14/17 0500 05/15/17 0506  Weight: 73.9 kg (163 lb) 71.2 kg (157 lb) 70.3 kg (155 lb)    Examination: General: no fever, denies CP, no nausea, no vomiting and reports breathing is stable. Patient requiring high flow oxygen and is still on IV antibiotics. Continue to have high output from chest tube.   Rest of physical exam remains unchanged from 05/14/17; see below for details.  Cardiovascular: RRR, positive SEM, no JVD. No rubs, no gallops Respiratory: good air movement overall, except for decrease BS at left bases; no wheezing, no frank crackles. Abdominal: soft, NT, ND, positive BS Extremities: trace edema bilaterally, no cyanosis   Data Reviewed: I have personally reviewed following labs and imaging studies  CBC: Recent  Labs  Lab 05/09/17 0730 05/10/17 0458 05/11/17 0738 05/14/17 0405  WBC 8.6 6.7 9.0 7.7  NEUTROABS 7.6 5.8  --   --   HGB 10.8* 11.0* 10.8* 9.6*  HCT 34.8* 36.2* 34.1* 30.4*  MCV 95.3 94.5 94.2 94.1  PLT 152 154 148* 161   Basic Metabolic Panel: Recent Labs  Lab 05/09/17 0730 05/10/17 0458 05/11/17 0451 05/12/17 1219 05/14/17 0405  NA 138 135 138 135 135  K 3.2* 2.3* 2.6* 3.4* 3.3*  CL 95* 90* 91* 92* 91*  CO2 37* 39* 39* 34* 36*  GLUCOSE 202* 308* 220* 347* 223*  BUN 17 17 16 16 19   CREATININE 0.40* 0.40* 0.39* 0.47* 0.49*  CALCIUM 7.5* 7.4* 7.8* 7.5* 7.7*  MG 1.7 1.7 1.9  --   --    GFR: Estimated Creatinine Clearance: 76.9 mL/min (A) (by C-G formula based on SCr of 0.49 mg/dL (L)).   Liver Function Tests: Recent Labs  Lab 05/09/17 0730 05/10/17 0458  AST 20  --   ALT 21  --   ALKPHOS 94  --   BILITOT 0.6  --   PROT 4.2*  --   ALBUMIN 1.6* 1.8*   CBG: Recent Labs  Lab  05/14/17 1142 05/14/17 1654 05/14/17 2209 05/15/17 0756 05/15/17 1150  GLUCAP 214* 175* 191* 162* 225*    Recent Results (from the past 240 hour(s))  Acid Fast Smear (AFB)     Status: None   Collection Time: 05/06/17  2:58 PM  Result Value Ref Range Status   AFB Specimen Processing Concentration  Final   Acid Fast Smear Negative  Final    Comment: (NOTE) Performed At: [facility] [address] [place], [place] [id number] Rush Farmer MD WJ:[id number]    Source (AFB) PLEURAL  Final    Comment: LEFT  Culture, body fluid-bottle     Status: None   Collection Time: 05/06/17  2:58 PM  Result Value Ref Range Status   Specimen Description PLEURAL LEFT  Final   Special Requests NONE  Final   Culture   Final    NO GROWTH 5 DAYS Performed at [facility], [place] [address], [place], [place] [address]    Report Status 05/11/2017 FINAL  Final  Gram stain     Status: None   Collection Time: 05/06/17  2:58 PM  Result Value Ref Range Status   Specimen Description PLEURAL LEFT  Final   Special Requests NONE  Final   Gram Stain   Final    MODERATE WBC PRESENT, PREDOMINANTLY PMN NO ORGANISMS SEEN Performed at [facility], [place] [address], [place], [place] [address]    Report Status 05/06/2017 FINAL  Final     Radiology Studies: No results found.  Scheduled Meds: . amiodarone  400 mg Oral BID  . budesonide (PULMICORT) nebulizer solution  0.25 mg Nebulization BID  . chlorhexidine  15 mL Mouth Rinse BID  . enoxaparin (LOVENOX) injection  40 mg Subcutaneous QHS  . [START ON 05/18/2017] enoxaparin (LOVENOX) injection  40 mg Subcutaneous Q24H  . feeding supplement  1 Container Oral BID BM  . furosemide  20 mg Intravenous Daily  . Gerhardt's butt cream   Topical TID  . insulin aspart  0-9 Units Subcutaneous TID WC  . ipratropium  0.5 mg Nebulization TID  . levalbuterol  1.25 mg Nebulization TID  . mouth rinse  15 mL Mouth Rinse q12n4p  . metoprolol tartrate  25  mg Oral BID  .  potassium chloride  30 mEq Oral Daily   Continuous Infusions: . cefTRIAXone (ROCEPHIN)  IV 2 g (05/15/17 1227)     LOS: 20 days    Time spent: Total of  20 minutes spent with pt, greater than 50% of which was spent in discussion of  treatment, counseling and coordination of care   Barton Dubois, MD Pager: [phone]  If 7PM-7AM, please contact night-coverage [email or web address] 05/15/2017, 3:03 PM

## 2017-05-15 NOTE — Progress Notes (Signed)
[facility]  Subjective: Feels a little bit better right now, chest tube still putting out quite a bit of fluid  Objective: Vitals:   05/14/17 1300 05/14/17 2043 05/14/17 2134 05/15/17 0506  BP: (!) 101/55  (!) 102/50 91/68  Pulse: 66  82 86  Resp: 20  20 20   Temp: 97.7 F (36.5 C)  98.1 F (36.7 C) 98.7 F (37.1 C)  TempSrc: Oral  Oral Axillary  SpO2: 100% 98% 99% 98%  Weight:    70.3 kg (155 lb)  Height:       7 L of oxygen  General cachectic, in bed Pulmonary diminished left side, clear on the right normal effort Belly soft, nontender Cardiovascular regular rate and rhythm no murmurs gallops or rubs Extremities some anasarca/edema noted  Impression: Empyema Advanced stage non-small cell lung cancer Strep pneumonia Acute respiratory failure with hypoxemia Emphysema Trapped lung  Discussion: I discussed the situation with Dr. Roxan Hockey and we think the best option would be to place a Pleurx catheter for drainage at home.  Obviously this is not an ideal situation for a Pleurx catheter given the recent infection, though the patient's goals are to just make it home with comfort.  As he has high output from the chest tube we do not think there is another good option in terms of pleurodesis.  Plan: Will ask IR to place a Pleurx tube next week Continue chest tube drainage as you are doing for now Continue oxygen to maintain O2 saturation greater than 88%, could wean today  Roselie Awkward, MD [place] [facility] Pager: [phone] Cell: [phone] After 3pm or if no response, call [phone]

## 2017-05-15 NOTE — Progress Notes (Signed)
Called to treatment machine 3. Therapist concerned they pulled out patient's chest tube while transferring him from the treatment table back to bed. Discovered chest tube was still in place but drainage tube was disconnected. Chest tube draining small amount of blood tinged fluid. Patient in no distress. Re-attached drainage tubing. Patient transferred back to hospital room. Phoned Anderson Malta, RN informing her of these findings. Anderson Malta, RN verbalized understanding and confirmed she would evaluate the patient.

## 2017-05-15 NOTE — Progress Notes (Signed)
Patient ID: Maurice Tyler, male   DOB: 26-Nov-1940, 77 y.o.   MRN: [id number]     Referring Physician(s): Dr. Brand Males  Supervising Physician: Markus Daft  Patient Status: [facility] - In-pt  Chief Complaint: Left pleural effusion  Subjective: Patient with no new complaints.  Still draining a lot from his drain.  Allergies: Patient has no known allergies.  Medications: Prior to Admission medications   Medication Sig Start Date End Date Taking? Authorizing Provider  acetaminophen (TYLENOL) 500 MG tablet Take 1,000 mg by mouth every 6 (six) hours as needed for moderate pain.   Yes [provider]  albuterol (PROVENTIL HFA;VENTOLIN HFA) 108 (90 Base) MCG/ACT inhaler Inhale 1 puff every 6 (six) hours as needed into the lungs for wheezing or shortness of breath.   Yes [provider]  loperamide (IMODIUM A-D) 2 MG tablet Take 2 mg by mouth 3 (three) times daily as needed for diarrhea or loose stools.   Yes [provider]  metFORMIN (GLUCOPHAGE) 500 MG tablet Take 500 mg 2 (two) times daily with a meal by mouth.    Yes [provider]  methylPREDNISolone (MEDROL DOSEPAK) 4 MG TBPK tablet Take per package instructions Patient taking differently: as directed. Take 6 tablets on Day 1, Take 5 tablets on Day 2, Take 4 tablets on Day 3, Take 3 tablets on Day 4, Take 2 tablets on Day 5 and take 1 tablet on Day 6. 04/24/17  Yes Curt Bears, MD  prochlorperazine (COMPAZINE) 10 MG tablet Take 1 tablet (10 mg total) by mouth every 6 (six) hours as needed for nausea or vomiting. 04/24/17  Yes Kyung Rudd, MD  ACCU-CHEK FASTCLIX LANCETS [place]  03/19/17   [provider]  ACCU-CHEK GUIDE test strip USE TO CHECK BLOOD SUGAR BID 03/23/17   [provider]  cetirizine (ZYRTEC) 10 MG tablet Take 10 mg daily by mouth. ALLERTEC    [provider]  nystatin (MYCOSTATIN) 100000 UNIT/ML suspension SWISH AND SWALLOW 5 MLS PO  AC AND QHS FOR 7  DAYS 03/31/17   [provider]    Vital Signs: BP 91/68 (BP Location: Right Arm)   Pulse 86   Temp 98.7 F (37.1 C) (Axillary)   Resp 20   Ht 5\' 11"  (1.803 m)   Wt 155 lb (70.3 kg)   SpO2 98%   BMI 21.62 kg/m   Physical Exam: Heart: RRR Chest: drain in place with slightly cloudy serous output.  Site is c/d/i.  500-1400cc of output a day.  No air leak. Diminished BS in left lower field.  Imaging: Dg Chest Port 1 View  Result Date: 05/13/2017 CLINICAL DATA:  Pleural effusion.  Chest tube. EXAM: PORTABLE CHEST 1 VIEW COMPARISON:  Multiple priors. FINDINGS: Pigtail catheter LEFT hemithorax and associated LEFT pneumothorax appear similar to yesterday's radiograph. There is poor reinflation of the LEFT lung. Suspected RIGHT lower chest wall skin fold, although a tiny lateral pneumothorax cannot be excluded. No concerning extrapleural air over the apex. Unchanged cardiomediastinal silhouette. IMPRESSION: Poor re-expansion of the LEFT lung. Overall stable LEFT hydropneumothorax. Skin fold versus tiny RIGHT lateral pneumothorax, continued surveillance warranted. Electronically Signed   By: Staci Righter M.D.   On: 05/13/2017 07:08    Labs:  CBC: Recent Labs    05/09/17 0730 05/10/17 0458 05/11/17 0738 05/14/17 0405  WBC 8.6 6.7 9.0 7.7  HGB 10.8* 11.0* 10.8* 9.6*  HCT 34.8* 36.2* 34.1* 30.4*  PLT 152 154 148* 159    COAGS:  Recent Labs    05/08/17 0422  INR 1.28    BMP: Recent Labs    05/10/17 0458 05/11/17 0451 05/12/17 1219 05/14/17 0405  NA 135 138 135 135  K 2.3* 2.6* 3.4* 3.3*  CL 90* 91* 92* 91*  CO2 39* 39* 34* 36*  GLUCOSE 308* 220* 347* 223*  BUN 17 16 16 19   CALCIUM 7.4* 7.8* 7.5* 7.7*  CREATININE 0.40* 0.39* 0.47* 0.49*  GFRNONAA >60 >60 >60 >60  GFRAA >60 >60 >60 >60    LIVER FUNCTION TESTS: Recent Labs    04/24/17 2054 04/26/17 0311 05/05/17 0333 05/06/17 0316 05/09/17 0730 05/10/17 0458  BILITOT 0.9 0.7  --  0.6 0.6  --   AST 26  25  --  16 20  --   ALT 22 22  --  32 21  --   ALKPHOS 132* 79  --  184* 94  --   PROT 7.4 5.7*  --  4.8* 4.2*  --   ALBUMIN 3.6 2.5* 1.8* 1.7* 1.6* 1.8*    Assessment and Plan: 1. Left pleural effusion, s/p perc drain  Drain in place with significant amount of daily still daily.  Dr. Anastasia Pall note reviewed.  Agree that this is a hard situation as because of his trapped lung, this free space is doing to continue to recollect with fluid to continue high output.  Due to the possibility of persistent infection, a pleurX is not a good idea.  Will continue to follow drain, and defer further plans to CCM.  ADDENDUM: I have spoken to Dr. Lake Bells who has spoken with Dr. Roxan Hockey to discuss this patient's situation.  He has a very difficult situation as discussed above.  In order to be able to eventually get the patient home and to palliate his condition, a pleurX catheter has been requested.  I spoke to Dr. Anselm Pancoast regarding the situation and he is agreeable to place a PleurX.  I have discussed this with the patient and who does not want to tell his wife at this time.  He was a little hesitant to move forward initially asking questions like how long does he have etc?  I told him I was not the person to address those issues.  He was agreeable to proceed.  I informed him if he changes his mind or something else occurs, then he can always ask to delay or refuse.  He was agreeable to proceed at this time.  We will plan on Monday 1/14. Risks and benefits discussed with the patient including bleeding, infection, damage to adjacent structures, sepsis. All of the patient's questions were answered, patient is agreeable to proceed. Consent signed and in chart.   Electronically Signed: Henreitta Cea 05/15/2017, 10:29 AM   I spent a total of 25 Minutes at the the patient's bedside AND on the patient's hospital floor or unit, greater than 50% of which was counseling/coordinating care for left pleural  effusion

## 2017-05-16 LAB — GLUCOSE, CAPILLARY
GLUCOSE-CAPILLARY: 249 mg/dL — AB (ref 65–99)
GLUCOSE-CAPILLARY: 208 mg/dL — AB (ref 65–99)
Glucose-Capillary: 211 mg/dL — ABNORMAL HIGH (ref 65–99)
Glucose-Capillary: 208 mg/dL — ABNORMAL HIGH (ref 65–99)

## 2017-05-16 LAB — CREATININE, SERUM
CREATININE: 0.49 mg/dL — AB (ref 0.61–1.24)
GFR calc non Af Amer: >60 mL/min (ref >60)

## 2017-05-16 NOTE — Progress Notes (Signed)
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 Patient being currently managed for multiofocal pneumonia (streptococcal) and left-sided Large hydropneumothorax with depression. Patient underwent placement of left pigtail on 05/08/16 with large amount of drainage.   Subjective: No CP, no nausea, no vomiting and no acute distress. Reports feeling better and with stable breathing. Still requiring high flow oxygen supplementation and with high output from chest tube.  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)   Diabetes mellitus type 2 in nonobese (HCC)   Acute respiratory failure with hypoxia and hypercapnia (HCC)   Severe sepsis (HCC)   Malnutrition of moderate degree   Pressure injury of skin   Empyema lung (HCC)   New onset a-fib (HCC)   AKI (acute kidney injury) ([place])   Non-small cell cancer of left lung (HCC)   Goals of care, counseling/discussion   Palliative care encounter   Streptococcal pneumonia ([place])   Malignant neoplasm of left lung ([place])   Acute on chronic respiratory failure with hypoxia (HCC)   Chest tube in place   Empyema ([place])  Acute respiratory failure with hypoxia and hypercarbia / Sepsis secondary to streptococcal multifocal pneumonia / Empyema w +/- malignant effusion  - CXR on admission showed left perihilar soft tissue mass consistent with known malignancy. - Pt is status post thoracentesis 12/23 - 1.4 L fluid removed; thoracentesis 12/27 - 2.3 L fluid, 05/06/17 0.5L removed fluid analysis c/w bacteria  empyema.  -s/p Pigtail chest tube placement 05/08/16 by IR - high output on pleur-evac  -Blood cx negative  -Pleural fluid cx grew strep viridans; continue abx's as per ID -will follow PCCM recommendations; planning pleurex tube placement next week. -Per ID recommendation continue Rocephin while inpatient, upon discharge can change to oral formulation to complete 28 days given empyema   -Still requiring high O2 supplement 5 L sat 99%; will continue to weaned as tolerated with a O2 goal above 88% -Not previously on O2 supplement at home   Leukocytosis: due to above -WBC normalized  -patient has remained afebrile -continue current abx's  Edema -    -Hypoproteinemia, 3rd spacing. -Continue lasix IV 20 mg - remains with good urine output  -Monitor Cr intermittently while on Lasix  -continue daily weights and strict intake and output  -overall swelling is well controlled/resolving.  Metastatic stage IV lung cancer -Follows with Dr. [name] -continue outpatient follow up -per patient plan is for no further chemotherapy  Afib with RVR - stable  -rate controlled -CHA2DS2-VASc Score 2 -will continue amiodarone and metoprolol -cards has seen the patient and recommended against anticoagulation given new afib and significant commorbidities (see 12/27 cards c/s note)  AKI - Resolved  -probably due to sepsis -follow Cr trend intermittently    Diabetes type 2 without complications- Stable  -continue SSI  -Continue to follow CVG and further adjust hypoglycemic regimen as needed.  Moderate protein calorie malnutrition -In the setting of chronic illness -Follow nutritional service rec's -Patient expressed increased appetite and eating more. -continue feeding supplements   Skin pressure injury, stage 1(left armpit) /  Stage 2 sacral ulcer -will follow rec's from wound care service -continue physical preventive measures.  Urinary retention  -Unclear etiology -Patient has  failed voiding trial; bladder scan with more than 900 cc urine accumulated on 05/13/17 -foley replaced at that time -no issues and good urine output seen since then.. -Normal PSA  -renal US done on 1/5 demonstrated no hydronephrosis or obstructive uropathy.    DVT prophylaxis: Lovenox sq daily Code Status: Full Code  Family Communication: None at bedside  Disposition Plan: plans is for patient to go to SNF when medically stable.  Will continue to follow PCCM and palliative care rec's.  Consultants:   IR  PCCM  ID   Palliative   Procedures:   Thoracentesis 12/23 and 12/27   Antimicrobials: Anti-infectives (From admission, onward)   Start     Dose/Rate Route Frequency Ordered Stop   04/29/17 1200  cefTRIAXone (ROCEPHIN) 2 g in dextrose 5 % 50 mL IVPB     2 g 100 mL/hr over 30 Minutes Intravenous Every 24 hours 04/29/17 1100     04/25/17 1200  vancomycin (VANCOCIN) IVPB 750 mg/150 ml premix  Status:  Discontinued     750 mg 150 mL/hr over 60 Minutes Intravenous Every 12 hours 04/25/17 0835 04/27/17 1406   04/25/17 1000  ceFEPIme (MAXIPIME) 1 g in dextrose 5 % 50 mL IVPB  Status:  Discontinued     1 g 100 mL/hr over 30 Minutes Intravenous Every 8 hours 04/25/17 0835 04/29/17 1100   04/25/17 0015  ceFEPIme (MAXIPIME) 2 g in dextrose 5 % 50 mL IVPB     2 g 100 mL/hr over 30 Minutes Intravenous  Once 04/25/17 0001 04/25/17 0119   04/25/17 0015  vancomycin (VANCOCIN) IVPB 1000 mg/200 mL premix     1,000 mg 200 mL/hr over 60 Minutes Intravenous  Once 04/25/17 0001 04/25/17 0259   04/25/17 0015  azithromycin (ZITHROMAX) 500 mg in dextrose 5 % 250 mL IVPB  Status:  Discontinued     500 mg 250 mL/hr over 60 Minutes Intravenous Daily at bedtime 04/25/17 0009 04/29/17 1100      Objective: Vitals:   05/16/17 0526 05/16/17 0808 05/16/17 1338 05/16/17 1431  BP: (!) 102/50   (!) 124/95  Pulse: 71 73 69 79  Resp: 17 17 18 16   Temp: 98.1 F (36.7 C)   98.4 F (36.9 C)  TempSrc:  Oral   Axillary  SpO2: 98% 96% 94% 97%  Weight:      Height:        Intake/Output Summary (Last 24 hours) at 05/16/2017 1641 Last data filed at 05/16/2017 1302 Gross per 24 hour  Intake 720 ml  Output 1700 ml  Net -980 ml   Filed Weights   05/12/17 0513 05/14/17 0500 05/15/17 0506  Weight: 73.9 kg (163 lb) 71.2 kg (157 lb) 70.3 kg (155 lb)    Examination: General: No fever, no chest pain, no nausea, no vomiting.  Patient reported that his breathing is a stable and continued to have high output out of his chest tube.  Currently using 5L high flow nasal cannula oxygen supplementation.  The rest of the physical exam remains unchanged from examination done on 05/15/17; see below for details: Cardiovascular: RRR, positive SEM, no JVD. No rubs, no gallops Respiratory: good air movement overall, except for decrease BS at left bases; no wheezing, no frank crackles. Abdominal: soft, NT, ND, positive BS Extremities: trace edema bilaterally, no cyanosis   Data Reviewed: I have  personally reviewed following labs and imaging studies  CBC: Recent Labs  Lab 05/10/17 0458 05/11/17 0738 05/14/17 0405  WBC 6.7 9.0 7.7  NEUTROABS 5.8  --   --   HGB 11.0* 10.8* 9.6*  HCT 36.2* 34.1* 30.4*  MCV 94.5 94.2 94.1  PLT 154 148* 071   Basic Metabolic Panel: Recent Labs  Lab 05/10/17 0458 05/11/17 0451 05/12/17 1219 05/14/17 0405 05/16/17 0417  NA 135 138 135 135  --   K 2.3* 2.6* 3.4* 3.3*  --   CL 90* 91* 92* 91*  --   CO2 39* 39* 34* 36*  --   GLUCOSE 308* 220* 347* 223*  --   BUN 17 16 16 19   --   CREATININE 0.40* 0.39* 0.47* 0.49* 0.49*  CALCIUM 7.4* 7.8* 7.5* 7.7*  --   MG 1.7 1.9  --   --   --    GFR: Estimated Creatinine Clearance: 76.9 mL/min (A) (by C-G formula based on SCr of 0.49 mg/dL (L)).   Liver Function Tests: Recent Labs  Lab 05/10/17 0458  ALBUMIN 1.8*   CBG: Recent Labs  Lab 05/15/17 1718 05/15/17 2222 05/16/17 0753 05/16/17 1143 05/16/17 1635  GLUCAP  150* 260* 211* 249* 208*    No results found for this or any previous visit (from the past 240 hour(s)).   Radiology Studies: No results found.  Scheduled Meds: . amiodarone  400 mg Oral BID  . budesonide (PULMICORT) nebulizer solution  0.25 mg Nebulization BID  . chlorhexidine  15 mL Mouth Rinse BID  . enoxaparin (LOVENOX) injection  40 mg Subcutaneous QHS  . [START ON 05/18/2017] enoxaparin (LOVENOX) injection  40 mg Subcutaneous Q24H  . feeding supplement  1 Container Oral BID BM  . furosemide  20 mg Intravenous Daily  . Gerhardt's butt cream   Topical TID  . insulin aspart  0-9 Units Subcutaneous TID WC  . ipratropium  0.5 mg Nebulization TID  . levalbuterol  1.25 mg Nebulization TID  . mouth rinse  15 mL Mouth Rinse q12n4p  . metoprolol tartrate  25 mg Oral BID  . potassium chloride  30 mEq Oral Daily   Continuous Infusions: . cefTRIAXone (ROCEPHIN)  IV Stopped (05/16/17 1248)     LOS: 21 days    Time spent: Total of  20 minutes spent with pt, greater than 50% of which was spent in discussion of  treatment, counseling and coordination of care   Barton Dubois, MD Pager: [phone]  If 7PM-7AM, please contact night-coverage [email or web address] 05/16/2017, 4:41 PM

## 2017-05-17 LAB — GLUCOSE, CAPILLARY
GLUCOSE-CAPILLARY: 269 mg/dL — AB (ref 65–99)
Glucose-Capillary: 192 mg/dL — ABNORMAL HIGH (ref 65–99)
Glucose-Capillary: 178 mg/dL — ABNORMAL HIGH (ref 65–99)
Glucose-Capillary: 145 mg/dL — ABNORMAL HIGH (ref 65–99)

## 2017-05-17 NOTE — Progress Notes (Signed)
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 Patient being currently managed for multiofocal pneumonia (streptococcal) and left-sided Large hydropneumothorax with depression. Patient underwent placement of left pigtail on 05/08/16 with large amount of drainage.   Subjective: No CP, no nausea, no vomiting, good BM reported. Patient continue to have high output from chest tube. Improving oxygen needs (down to 2-4L regular Maurice Tyler supplementation).  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)   Diabetes mellitus type 2 in nonobese (HCC)   Acute respiratory failure with hypoxia and hypercapnia (HCC)   Severe sepsis (HCC)   Malnutrition of moderate degree   Pressure injury of skin   Empyema lung (HCC)   New onset a-fib (HCC)   AKI (acute kidney injury) ([place])   Non-small cell cancer of left lung (HCC)   Goals of care, counseling/discussion   Palliative care encounter   Streptococcal pneumonia ([place])   Malignant neoplasm of left lung ([place])   Acute on chronic respiratory failure with hypoxia (HCC)   Chest tube in place   Empyema ([place])  Acute respiratory failure with hypoxia and hypercarbia / Sepsis secondary to streptococcal multifocal pneumonia / Empyema w +/- malignant effusion  - CXR on admission showed left perihilar soft tissue mass consistent with known malignancy. - Pt is status post thoracentesis 12/23 - 1.4 L fluid removed; thoracentesis 12/27 - 2.3 L fluid, 05/06/17 0.5L removed fluid analysis c/w bacteria empyema. Repeat  testing still suggesting bacteria presence.  -s/p Pigtail chest tube placement 05/08/16 by IR - continue to have high output on chest tube output.  -Blood cx negative  -Pleural fluid cx grew strep viridans; continue abx's as per ID -will follow PCCM recommendations; planning pleurex tube placement next week. -Per ID recommendation continue Rocephin while inpatient, upon discharge can change to oral formulation to complete 28 days given empyema   -Still requiring high O2 supplement 5 L sat 99%; will continue to weaned as tolerated with a O2 goal above 88% -Not previously on O2 supplement at home   Leukocytosis: due to above -WBC normalized  -patient has remained afebrile -continue current abx's  Edema -    -Hypoproteinemia, 3rd spacing. -Continue lasix IV 20 mg - remains with good urine output  -Monitor Cr intermittently while on Lasix  -continue daily weights and strict intake and output  -overall swelling is well controlled/resolved. -continue improving nutrition zzzzzzzzzzz  Metastatic stage IV lung cancer -Follows with Dr. Julien Tyler -continue outpatient follow up -per patient report; no further chemotherapy or treatment recommended  Afib with RVR - stable  -rate controlled -CHA2DS2-VASc Score 2 -will continue amiodarone and metoprolol -cards has seen the patient and recommended against anticoagulation given new afib and significant commorbidities (see 12/27 cards c/s note)  AKI - Resolved  -probably due to sepsis -follow Cr trend intermittently' especially while actively using lasix.   Diabetes type 2 without complications- Stable  -continue SSI  -Continue to follow CBG's and further adjust hypoglycemic regimen as needed. -so far stable and well controlled with current regimen   Moderate to severe protein calorie malnutrition  now -In the setting of chronic illness -Follow nutritional service rec's for feeding supplemetd -Patient expressed increased appetite and  eating more.  Skin pressure injury, stage 1(left armpit) / Stage 2 sacral ulcer -will follow rec's from wound care service -continue physical preventive measures.  Urinary retention  -Unclear etiology -Patient has failed voiding trial; bladder scan with more than 900 cc urine accumulated on 05/13/17 -foley replaced at that time -no issues and good urine output seen since then. -will require continue foley at discharge and follow up with urology as an outpatient.  -Normal PSA  -renal US done on 1/5 demonstrated no hydronephrosis or obstructive uropathy.    DVT prophylaxis: Lovenox sq daily Code Status: Full Code  Family Communication: None at bedside  Disposition Plan: plans is for patient to go to SNF when medically stable.  Will continue to follow PCCM and palliative care rec's.  Consultants:   IR  PCCM  ID   Palliative   Procedures:   Thoracentesis 12/23 and 12/27   Antimicrobials: Anti-infectives (From admission, onward)   Start     Dose/Rate Route Frequency Ordered Stop   04/29/17 1200  cefTRIAXone (ROCEPHIN) 2 g in dextrose 5 % 50 mL IVPB     2 g 100 mL/hr over 30 Minutes Intravenous Every 24 hours 04/29/17 1100     04/25/17 1200  vancomycin (VANCOCIN) IVPB 750 mg/150 ml premix  Status:  Discontinued     750 mg 150 mL/hr over 60 Minutes Intravenous Every 12 hours 04/25/17 0835 04/27/17 1406   04/25/17 1000  ceFEPIme (MAXIPIME) 1 g in dextrose 5 % 50 mL IVPB  Status:  Discontinued     1 g 100 mL/hr over 30 Minutes Intravenous Every 8 hours 04/25/17 0835 04/29/17 1100   04/25/17 0015  ceFEPIme (MAXIPIME) 2 g in dextrose 5 % 50 mL IVPB     2 g 100 mL/hr over 30 Minutes Intravenous  Once 04/25/17 0001 04/25/17 0119   04/25/17 0015  vancomycin (VANCOCIN) IVPB 1000 mg/200 mL premix     1,000 mg 200 mL/hr over 60 Minutes Intravenous  Once 04/25/17 0001 04/25/17 0259   04/25/17 0015  azithromycin (ZITHROMAX) 500 mg in dextrose 5 % 250 mL IVPB  Status:  Discontinued      500 mg 250 mL/hr over 60 Minutes Intravenous Daily at bedtime 04/25/17 0009 04/29/17 1100      Objective: Vitals:   05/16/17 2051 05/16/17 2119 05/17/17 0502 05/17/17 0856  BP:  (!) 112/46 (!) 99/55   Pulse:  83 77   Resp:  18 17   Temp:  98.4 F (36.9 C) 97.6 F (36.4 C)   TempSrc:  Axillary Axillary   SpO2: 93% 95% 98% 92%  Weight:      Height:        Intake/Output Summary (Last 24 hours) at 05/17/2017 0948 Last data filed at 05/17/2017 0700 Gross per 24 hour  Intake 480 ml  Output 2400 ml  Net -1920 ml   Filed Weights   05/12/17 0513 05/14/17 0500 05/15/17 0506  Weight: 73.9 kg (163 lb) 71.2 kg (157 lb) 70.3 kg (155 lb)    Examination: General: afebrile, no CP, no abd pain, no nausea, no vomiting. Reports breathing is unlabored and stable. Down to 2-4 L of regular O2 saturation. Continue to have high output out of his Chest Tube. Patient is frail, chronically ill in appearance and cachetic.  Cardiovascular: RRR, no rubs, no gallops, positive SEM, no JVD on exam.  Respiratory:  good air movement bilaterally, decrease BS at left lung bases; no wheezing or frank crackles appreciated. Positive scattered rhonchi. Abdominal: soft, NT, ND, positive BS Extremities: no edema appreciated on his LE's, no cyanosis.    Data Reviewed: I have personally reviewed following labs and imaging studies  CBC: Recent Labs  Lab 05/11/17 0738 05/14/17 0405  WBC 9.0 7.7  HGB 10.8* 9.6*  HCT 34.1* 30.4*  MCV 94.2 94.1  PLT 148* 875   Basic Metabolic Panel: Recent Labs  Lab 05/11/17 0451 05/12/17 1219 05/14/17 0405 05/16/17 0417  NA 138 135 135  --   K 2.6* 3.4* 3.3*  --   CL 91* 92* 91*  --   CO2 39* 34* 36*  --   GLUCOSE 220* 347* 223*  --   BUN 16 16 19   --   CREATININE 0.39* 0.47* 0.49* 0.49*  CALCIUM 7.8* 7.5* 7.7*  --   MG 1.9  --   --   --    GFR: Estimated Creatinine Clearance: 76.9 mL/min (A) (by C-G formula based on SCr of 0.49 mg/dL (L)).   CBG: Recent  Labs  Lab 05/16/17 0753 05/16/17 1143 05/16/17 1635 05/16/17 2243 05/17/17 0738  GLUCAP 211* 249* 208* 208* 192*    No results found for this or any previous visit (from the past 240 hour(s)).   Radiology Studies: No results found.  Scheduled Meds: . amiodarone  400 mg Oral BID  . budesonide (PULMICORT) nebulizer solution  0.25 mg Nebulization BID  . chlorhexidine  15 mL Mouth Rinse BID  . [START ON 05/18/2017] enoxaparin (LOVENOX) injection  40 mg Subcutaneous Q24H  . feeding supplement  1 Container Oral BID BM  . furosemide  20 mg Intravenous Daily  . Gerhardt's butt cream   Topical TID  . insulin aspart  0-9 Units Subcutaneous TID WC  . ipratropium  0.5 mg Nebulization TID  . levalbuterol  1.25 mg Nebulization TID  . mouth rinse  15 mL Mouth Rinse q12n4p  . metoprolol tartrate  25 mg Oral BID  . potassium chloride  30 mEq Oral Daily   Continuous Infusions: . cefTRIAXone (ROCEPHIN)  IV Stopped (05/16/17 1248)     LOS: 22 days    Time spent: Total of  20 minutes spent with pt, greater than 50% of which was spent in discussion of  treatment, counseling and coordination of care   Barton Dubois, MD Pager: [phone]  If 7PM-7AM, please contact night-coverage [email or web address] 05/17/2017, 9:48 AM

## 2017-05-18 ENCOUNTER — Ambulatory Visit: Payer: Medicare Other

## 2017-05-18 LAB — COMPREHENSIVE METABOLIC PANEL
ALT: 75 U/L — ABNORMAL HIGH (ref 17–63)
ANION GAP: 8 (ref 5–15)
AST: 31 U/L (ref 15–41)
Albumin: 2.1 g/dL — ABNORMAL LOW (ref 3.5–5.0)
Alkaline Phosphatase: 104 U/L (ref 38–126)
BUN: 19 mg/dL (ref 6–20)
CHLORIDE: 94 mmol/L — AB (ref 101–111)
CO2: 34 mmol/L — ABNORMAL HIGH (ref 22–32)
Calcium: 8.2 mg/dL — ABNORMAL LOW (ref 8.9–10.3)
Creatinine, Ser: 0.47 mg/dL — ABNORMAL LOW (ref 0.61–1.24)
Glucose, Bld: 177 mg/dL — ABNORMAL HIGH (ref 65–99)
POTASSIUM: 4 mmol/L (ref 3.5–5.1)
Sodium: 136 mmol/L (ref 135–145)
TOTAL PROTEIN: 5.5 g/dL — AB (ref 6.5–8.1)
Total Bilirubin: 0.4 mg/dL (ref 0.3–1.2)

## 2017-05-18 LAB — GLUCOSE, CAPILLARY
GLUCOSE-CAPILLARY: 159 mg/dL — AB (ref 65–99)
GLUCOSE-CAPILLARY: 295 mg/dL — AB (ref 65–99)
Glucose-Capillary: 106 mg/dL — ABNORMAL HIGH (ref 65–99)
Glucose-Capillary: 164 mg/dL — ABNORMAL HIGH (ref 65–99)

## 2017-05-18 LAB — PROTIME-INR
INR: 1.12
Prothrombin Time: 14.3 seconds (ref 11.4–15.2)

## 2017-05-18 LAB — CBC
HEMATOCRIT: 32.2 % — AB (ref 39.0–52.0)
HEMOGLOBIN: 10.1 g/dL — AB (ref 13.0–17.0)
MCH: 29.5 pg (ref 26.0–34.0)
MCHC: 31.4 g/dL (ref 30.0–36.0)
MCV: 94.2 fL (ref 78.0–100.0)
Platelets: 291 10*3/uL (ref 150–400)
RBC: 3.42 MIL/uL — AB (ref 4.22–5.81)
RDW: 14.6 % (ref 11.5–15.5)
WBC: 6.1 10*3/uL (ref 4.0–10.5)

## 2017-05-18 NOTE — Care Management Important Message (Signed)
Important Message  Patient Details  Name: Milad Bublitz MRN: [id number] Date of Birth: 06/29/1940   Medicare Important Message Given:  Yes    Kerin Salen 05/18/2017, 12:05 [place] Message  Patient Details  Name: Rocio Wolak MRN: [id number] Date of Birth: 1941-04-30   Medicare Important Message Given:  Yes    Kerin Salen 05/18/2017, 12:05 PM

## 2017-05-18 NOTE — Plan of Care (Signed)
  Progressing Health Behavior/Discharge Planning: Ability to manage health-related needs will improve 05/18/2017 1411 - Progressing by Zadie Rhine, RN Clinical Measurements: Will remain free from infection 05/18/2017 1411 - Progressing by Zadie Rhine, RN Diagnostic test results will improve 05/18/2017 1411 - Progressing by Zadie Rhine, RN Respiratory complications will improve 05/18/2017 1411 - Progressing by Zadie Rhine, RN Cardiovascular complication will be avoided 05/18/2017 1411 - Progressing by Zadie Rhine, RN Elimination: Will not experience complications related to bowel motility 05/18/2017 1411 - Progressing by Zadie Rhine, RN Will not experience complications related to urinary retention 05/18/2017 1411 - Progressing by Zadie Rhine, RN Skin Integrity: Risk for impaired skin integrity will decrease 05/18/2017 1411 - Progressing by Zadie Rhine, RN

## 2017-05-18 NOTE — Progress Notes (Signed)
Patient ID: Maurice Tyler, male   DOB: [birth year], 77 y.o.   MRN: [id number]  PROGRESS NOTE    Maurice Tyler  SWN:[id number] DOB: 1941/03/01 DOA: 04/24/2017 PCP: [name], MD   Brief Narrative:  77 year old male with history of stage IV non-small cell lung cancer on chemotherapy, diabetes mellitus presented with shortness of breath and cough.  Patient is currently being managed for multifocal streptococcal pneumonia and large left hydropneumothorax.  Patient underwent placement of left pigtail catheter on 05/08/2017. PCCM following.   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)   Diabetes mellitus type 2 in nonobese (HCC)   Acute respiratory failure with hypoxia and hypercapnia (HCC)   Severe sepsis (HCC)   Malnutrition of moderate degree   Pressure injury of skin   Empyema lung (HCC)   New onset a-fib (HCC)   AKI (acute kidney injury) ([place])   Non-small cell cancer of left lung (HCC)   Goals of care, counseling/discussion   Palliative care encounter   Streptococcal pneumonia ([place])   Malignant neoplasm of left lung ([place])   Acute on chronic respiratory failure with hypoxia (HCC)   Chest tube in place   Empyema ([place])   Acute respiratory failure with hypoxia and hypercarbia / Sepsis secondary to streptococcal multifocal pneumonia / Empyema w +/- malignant effusion  - CXR on admission showed left perihilar soft tissue mass consistent with known malignancy. - Pt is status post thoracentesis 04/26/17 - 1.4 L fluid removed; thoracentesis 04/30/17 - 2.3 L fluid, 05/06/17 0.5L removed. fluid analysis consistent with empyema.   -s/p Pigtail chest tube placement 05/08/17 by IR - continues to have high output on chest tube output.  -Blood cx negative  -Pleural fluid culture grew strep viridans : Per ID recommendation continue Rocephin while inpatient, upon discharge can change to oral formulation  to complete 28 days given empyema   - PCCM following: Probable plan for Pleurx catheter placement by IR today. -Wean off oxygen as able.  -Not previously on O2 supplement at home   Leukocytosis: due to above -Resolved -Antibiotic plan as above  Edema -    -Hypoproteinemia, 3rd spacing. -Continue lasix IV 20 mg - remains with good urine output  -Monitor Cr intermittently while on Lasix  -continue daily weights and strict intake and output  -overall swelling is well controlled/resolved. -continue improving nutrition  Metastatic stage IV lung cancer -Follows with Dr. [name] -continue outpatient follow up -per patient report; no further chemotherapy or treatment recommended  Afib with RVR - stable  -rate controlled -CHA2DS2-VASc Score 2 -will continue amiodarone and metoprolol -cards has seen the patient and recommended against anticoagulation given new afib and significant commorbidities (see 04/30/17  cardiology note)  AKI - Resolved  -probably due to sepsis -follow Cr trend intermittently especially while actively using lasix.   Diabetes type 2 without complications- Stable  -Continue sliding scale insulin with coverage  Moderate to severe protein calorie malnutrition now -In the setting of chronic illness -Follow nutritional service recommendations for feeding supplement  Skin pressure injury, stage 1(left armpit) / Stage 2 sacral ulcer -Follow wound care service recommendations  Urinary retention  -Unclear etiology -Patient has failed voiding trial; bladder scan with more than 900 cc urine accumulated on 05/13/17 -foley replaced at that time -no issues and good urine output seen since then. -will require continue foley at discharge and follow up with urology as an outpatient.  -  Normal PSA  -renal US done on 05/09/17 demonstrated no hydronephrosis or obstructive uropathy.    DVT prophylaxis: Lovenox Code Status: Full Family Communication: None at  bedside Disposition Plan: Nursing home once medically stable  Consultants:   IR  PCCM  ID   Palliative     Procedures:   Thoracentesis 04/26/17 and 04/30/17  Pigtail catheter placement by IR on 05/08/2017    Antimicrobials:  Anti-infectives (From admission, onward)   Start     Dose/Rate Route Frequency Ordered Stop   04/29/17 1200  cefTRIAXone (ROCEPHIN) 2 g in dextrose 5 % 50 mL IVPB     2 g 100 mL/hr over 30 Minutes Intravenous Every 24 hours 04/29/17 1100     04/25/17 1200  vancomycin (VANCOCIN) IVPB 750 mg/150 ml premix  Status:  Discontinued     750 mg 150 mL/hr over 60 Minutes Intravenous Every 12 hours 04/25/17 0835 04/27/17 1406   04/25/17 1000  ceFEPIme (MAXIPIME) 1 g in dextrose 5 % 50 mL IVPB  Status:  Discontinued     1 g 100 mL/hr over 30 Minutes Intravenous Every 8 hours 04/25/17 0835 04/29/17 1100   04/25/17 0015  ceFEPIme (MAXIPIME) 2 g in dextrose 5 % 50 mL IVPB     2 g 100 mL/hr over 30 Minutes Intravenous  Once 04/25/17 0001 04/25/17 0119   04/25/17 0015  vancomycin (VANCOCIN) IVPB 1000 mg/200 mL premix     1,000 mg 200 mL/hr over 60 Minutes Intravenous  Once 04/25/17 0001 04/25/17 0259   04/25/17 0015  azithromycin (ZITHROMAX) 500 mg in dextrose 5 % 250 mL IVPB  Status:  Discontinued     500 mg 250 mL/hr over 60 Minutes Intravenous Daily at bedtime 04/25/17 0009 04/29/17 1100       Subjective: Patient seen and examined at bedside.  Denies any new complaints.  No overnight fever, nausea or vomiting.  Objective: Vitals:   05/17/17 1557 05/17/17 2012 05/17/17 2100 05/18/17 0405  BP: (!) 103/46  (!) 109/50 (!) 112/55  Pulse: 80  85 81  Resp: 18  18 18   Temp: 97.8 F (36.6 C)  98.4 F (36.9 C) 98.1 F (36.7 C)  TempSrc: Axillary  Axillary Axillary  SpO2: 92% 92% 95% 95%  Weight:      Height:        Intake/Output Summary (Last 24 hours) at 05/18/2017 1303 Last data filed at 05/18/2017 1200 Gross per 24 hour  Intake 580 ml  Output 2410  ml  Net -1830 ml   Filed Weights   05/12/17 0513 05/14/17 0500 05/15/17 0506  Weight: 73.9 kg (163 lb) 71.2 kg (157 lb) 70.3 kg (155 lb)    Examination:  General exam: Appears calm and comfortable.  No acute distress.  Looks cachectic Respiratory system: Bilateral decreased breath sound at bases with scattered crackles.  Left-sided chest tube present Cardiovascular system: S1 & S2 heard, rate controlled  gastrointestinal system: Abdomen is nondistended, soft and nontender. Normal bowel sounds heard. Extremities: No cyanosis, clubbing, edema    Data Reviewed: I have personally reviewed following labs and imaging studies  CBC: Recent Labs  Lab 05/14/17 0405 05/18/17 0442  WBC 7.7 6.1  HGB 9.6* 10.1*  HCT 30.4* 32.2*  MCV 94.1 94.2  PLT 159 409   Basic Metabolic Panel: Recent Labs  Lab 05/12/17 1219 05/14/17 0405 05/16/17 0417 05/18/17 0442  NA 135 135  --  136  K 3.4* 3.3*  --  4.0  CL 92* 91*  --  94*  CO2 34* 36*  --  34*  GLUCOSE 347* 223*  --  177*  BUN 16 19  --  19  CREATININE 0.47* 0.49* 0.49* 0.47*  CALCIUM 7.5* 7.7*  --  8.2*   GFR: Estimated Creatinine Clearance: 76.9 mL/min (A) (by C-G formula based on SCr of 0.47 mg/dL (L)). Liver Function Tests: Recent Labs  Lab 05/18/17 0442  AST 31  ALT 75*  ALKPHOS 104  BILITOT 0.4  PROT 5.5*  ALBUMIN 2.1*   No results for input(s): LIPASE, AMYLASE in the last 168 hours. No results for input(s): AMMONIA in the last 168 hours. Coagulation Profile: Recent Labs  Lab 05/18/17 0442  INR 1.12   Cardiac Enzymes: No results for input(s): CKTOTAL, CKMB, CKMBINDEX, TROPONINI in the last 168 hours. BNP (last 3 results) No results for input(s): PROBNP in the last 8760 hours. HbA1C: No results for input(s): HGBA1C in the last 72 hours. CBG: Recent Labs  Lab 05/17/17 1224 05/17/17 1717 05/17/17 2100 05/18/17 0725 05/18/17 1146  GLUCAP 178* 269* 145* 159* 106*   Lipid Profile: No results for input(s):  CHOL, HDL, LDLCALC, TRIG, CHOLHDL, LDLDIRECT in the last 72 hours. Thyroid Function Tests: No results for input(s): TSH, T4TOTAL, FREET4, T3FREE, THYROIDAB in the last 72 hours. Anemia Panel: No results for input(s): VITAMINB12, FOLATE, FERRITIN, TIBC, IRON, RETICCTPCT in the last 72 hours. Sepsis Labs: No results for input(s): PROCALCITON, LATICACIDVEN in the last 168 hours.  No results found for this or any previous visit (from the past 240 hour(s)).       Radiology Studies: No results found.      Scheduled Meds: . amiodarone  400 mg Oral BID  . budesonide (PULMICORT) nebulizer solution  0.25 mg Nebulization BID  . chlorhexidine  15 mL Mouth Rinse BID  . enoxaparin (LOVENOX) injection  40 mg Subcutaneous Q24H  . feeding supplement  1 Container Oral BID BM  . furosemide  20 mg Intravenous Daily  . Gerhardt's butt cream   Topical TID  . insulin aspart  0-9 Units Subcutaneous TID WC  . ipratropium  0.5 mg Nebulization TID  . levalbuterol  1.25 mg Nebulization TID  . mouth rinse  15 mL Mouth Rinse q12n4p  . metoprolol tartrate  25 mg Oral BID  . potassium chloride  30 mEq Oral Daily   Continuous Infusions: . cefTRIAXone (ROCEPHIN)  IV 2 g (05/18/17 1215)     LOS: 23 days        Aline August, MD [facility] Pager [phone]  If 7PM-7AM, please contact night-coverage [email or web address] Password [email or web address] 05/18/2017, 1:03 PM

## 2017-05-18 NOTE — Progress Notes (Signed)
Pt decided he is not have the Pleurx drain placed today. Called IR to update and explain to [place], [place]. Explained to pt process for procedure and he understands, however has decided to not have proc today and will consider tomorrow. MD updated. SRP, RN

## 2017-05-19 ENCOUNTER — Encounter: Payer: Self-pay | Admitting: Radiation Oncology

## 2017-05-19 ENCOUNTER — Encounter (HOSPITAL_COMMUNITY): Payer: Self-pay | Admitting: Interventional Radiology

## 2017-05-19 ENCOUNTER — Inpatient Hospital Stay (HOSPITAL_COMMUNITY): Payer: Medicare Other

## 2017-05-19 ENCOUNTER — Ambulatory Visit: Payer: Medicare Other

## 2017-05-19 HISTORY — PX: IR GUIDED DRAIN W CATHETER PLACEMENT: IMG719

## 2017-05-19 LAB — BASIC METABOLIC PANEL
ANION GAP: 9 (ref 5–15)
BUN: 18 mg/dL (ref 6–20)
CALCIUM: 8 mg/dL — AB (ref 8.9–10.3)
CO2: 31 mmol/L (ref 22–32)
Chloride: 95 mmol/L — ABNORMAL LOW (ref 101–111)
Creatinine, Ser: 0.48 mg/dL — ABNORMAL LOW (ref 0.61–1.24)
GFR calc non Af Amer: >60 mL/min (ref >60)
GLUCOSE: 161 mg/dL — AB (ref 65–99)
Potassium: 3.7 mmol/L (ref 3.5–5.1)
Sodium: 135 mmol/L (ref 135–145)

## 2017-05-19 LAB — CBC WITH DIFFERENTIAL/PLATELET
BASOS ABS: 0 10*3/uL (ref 0.0–0.1)
BASOS PCT: 0 %
EOS ABS: 0.1 10*3/uL (ref 0.0–0.7)
Eosinophils Relative: 1 %
HCT: 32.3 % — ABNORMAL LOW (ref 39.0–52.0)
HEMOGLOBIN: 10.4 g/dL — AB (ref 13.0–17.0)
Lymphocytes Relative: 9 %
Lymphs Abs: 0.6 10*3/uL — ABNORMAL LOW (ref 0.7–4.0)
MCH: 30.1 pg (ref 26.0–34.0)
MCHC: 32.2 g/dL (ref 30.0–36.0)
MCV: 93.4 fL (ref 78.0–100.0)
MONOS PCT: 9 %
Monocytes Absolute: 0.6 10*3/uL (ref 0.1–1.0)
NEUTROS ABS: 5.1 10*3/uL (ref 1.7–7.7)
NEUTROS PCT: 81 %
Platelets: 306 10*3/uL (ref 150–400)
RBC: 3.46 MIL/uL — ABNORMAL LOW (ref 4.22–5.81)
RDW: 14.9 % (ref 11.5–15.5)
WBC: 6.4 10*3/uL (ref 4.0–10.5)

## 2017-05-19 LAB — MAGNESIUM: Magnesium: 2 mg/dL (ref 1.7–2.4)

## 2017-05-19 LAB — GLUCOSE, CAPILLARY
GLUCOSE-CAPILLARY: 170 mg/dL — AB (ref 65–99)
GLUCOSE-CAPILLARY: 132 mg/dL — AB (ref 65–99)
GLUCOSE-CAPILLARY: 149 mg/dL — AB (ref 65–99)
GLUCOSE-CAPILLARY: 168 mg/dL — AB (ref 65–99)

## 2017-05-19 MED ORDER — FENTANYL CITRATE (PF) 100 MCG/2ML IJ SOLN
INTRAMUSCULAR | Status: AC | PRN
  Administered 2017-05-19 (×2): 50 ug via INTRAVENOUS

## 2017-05-19 MED ORDER — POTASSIUM CHLORIDE 20 MEQ/15ML (10%) PO SOLN
30.0000 meq | Freq: Every day | ORAL | Status: DC
  Administered 2017-05-20: 30 meq via ORAL
  Filled 2017-05-19: qty 30

## 2017-05-19 MED ORDER — LIDOCAINE HCL (PF) 1 % IJ SOLN
INTRAMUSCULAR | Status: AC | PRN
  Administered 2017-05-19: 10 mL

## 2017-05-19 MED ORDER — LIDOCAINE HCL 1 % IJ SOLN
INTRAMUSCULAR | Status: AC | PRN
  Administered 2017-05-19: 20 mL

## 2017-05-19 MED ORDER — MIDAZOLAM HCL 2 MG/2ML IJ SOLN
INTRAMUSCULAR | Status: AC | PRN
  Administered 2017-05-19 (×2): 1 mg via INTRAVENOUS

## 2017-05-19 MED ORDER — FUROSEMIDE 20 MG PO TABS
20.0000 mg | ORAL_TABLET | Freq: Every day | ORAL | Status: DC
  Administered 2017-05-20: 20 mg via ORAL
  Filled 2017-05-19: qty 1

## 2017-05-19 MED ORDER — FENTANYL CITRATE (PF) 100 MCG/2ML IJ SOLN
INTRAMUSCULAR | Status: AC
  Filled 2017-05-19: qty 2

## 2017-05-19 MED ORDER — MIDAZOLAM HCL 2 MG/2ML IJ SOLN
INTRAMUSCULAR | Status: AC
  Filled 2017-05-19: qty 4

## 2017-05-19 NOTE — Procedures (Signed)
L Pleurx drain EBL 0 Comp 0

## 2017-05-19 NOTE — Progress Notes (Addendum)
Patient ID: Maurice Tyler, male   DOB: 31-Mar-1941, 77 y.o.   MRN: [id number]  PROGRESS NOTE    Gemayel Mascio  NWG:[id number] DOB: 05-03-1941 DOA: 04/24/2017 PCP: [name], MD   Brief Narrative:  77 year old male with history of stage IV non-small cell lung cancer on chemotherapy, diabetes mellitus presented with shortness of breath and cough.  Patient is currently being managed for multifocal streptococcal pneumonia and large left hydropneumothorax.  Patient underwent placement of left pigtail catheter on 05/08/2017. PCCM following.   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)   Diabetes mellitus type 2 in nonobese (HCC)   Acute respiratory failure with hypoxia and hypercapnia (HCC)   Severe sepsis (HCC)   Malnutrition of moderate degree   Pressure injury of skin   Empyema lung (HCC)   New onset a-fib (HCC)   AKI (acute kidney injury) ([place])   Non-small cell cancer of left lung (HCC)   Goals of care, counseling/discussion   Palliative care encounter   Streptococcal pneumonia ([place])   Malignant neoplasm of left lung ([place])   Acute on chronic respiratory failure with hypoxia (HCC)   Chest tube in place   Empyema ([place])   Acute respiratory failure with hypoxia and hypercarbia / Sepsis secondary to streptococcal multifocal pneumonia / Empyema w +/- malignant effusion  - CXR on admission showed left perihilar soft tissue mass consistent with known malignancy. - Pt is status post thoracentesis 04/26/17 - 1.4 L fluid removed; thoracentesis 04/30/17 - 2.3 L fluid, 05/06/17 0.5L removed. fluid analysis consistent with empyema.   -s/p Pigtail chest tube placement 05/08/17 by IR - continues to have high output on chest tube output.  -Blood cx negative  -Pleural fluid culture grew strep viridans : Per ID recommendation continue Rocephin while inpatient, upon discharge can change to oral formulation  to complete 28 days given empyema   - PCCM following: Probable plan for Pleurx catheter placement by IR today. Patient canceled procedure yesterday. -Wean off oxygen as able.  -Not previously on O2 supplement at home   Leukocytosis: due to above -Resolved -Antibiotic plan as above  Edema -    -Hypoproteinemia, 3rd spacing. -Switch Lasix to oral- remains with good urine output  -Monitor Cr intermittently while on Lasix  -continue daily weights and strict intake and output  -overall swelling is well controlled/resolved. -continue improving nutrition  Metastatic stage IV lung cancer -Follows with Dr. [name] -continue outpatient follow up -per patient report; no further chemotherapy or treatment recommended -We will change CODE STATUS to DNR as patient wishes to be DNR.  We will reconsult palliative care because of overall poor prognosis.  Patient states that he does not remember speaking to palliative care team.  Afib with RVR - stable  -rate controlled -CHA2DS2-VASc Score 2 -will continue amiodarone and metoprolol -cards has seen the patient and recommended against anticoagulation given new afib and significant commorbidities (see 04/30/17  cardiology note)  AKI - Resolved  -probably due to sepsis -follow Cr trend intermittently especially while actively using lasix.   Diabetes type 2 without complications- Stable  -Continue sliding scale insulin with coverage  Moderate to severe protein calorie malnutrition now -In the setting of chronic illness -Follow nutritional service recommendations for feeding supplement  Skin pressure injury, stage 1(left armpit) / Stage 2 sacral ulcer -Follow wound care service recommendations  Urinary retention  -Unclear etiology -Patient has failed voiding trial; bladder  scan with more than 900 cc urine accumulated on 05/13/17 -foley replaced at that time -no issues and good urine output seen since then. -will require continue  foley at discharge and follow up with urology as an outpatient.  -Normal PSA  -renal US done on 05/09/17 demonstrated no hydronephrosis or obstructive uropathy.    DVT prophylaxis: Lovenox Code Status: DNR Family Communication: None at bedside Disposition Plan: Nursing home once medically stable  Consultants:   IR  PCCM  ID   Palliative     Procedures:   Thoracentesis 04/26/17 and 04/30/17  Pigtail catheter placement by IR on 05/08/2017    Antimicrobials:  Anti-infectives (From admission, onward)   Start     Dose/Rate Route Frequency Ordered Stop   04/29/17 1200  cefTRIAXone (ROCEPHIN) 2 g in dextrose 5 % 50 mL IVPB     2 g 100 mL/hr over 30 Minutes Intravenous Every 24 hours 04/29/17 1100     04/25/17 1200  vancomycin (VANCOCIN) IVPB 750 mg/150 ml premix  Status:  Discontinued     750 mg 150 mL/hr over 60 Minutes Intravenous Every 12 hours 04/25/17 0835 04/27/17 1406   04/25/17 1000  ceFEPIme (MAXIPIME) 1 g in dextrose 5 % 50 mL IVPB  Status:  Discontinued     1 g 100 mL/hr over 30 Minutes Intravenous Every 8 hours 04/25/17 0835 04/29/17 1100   04/25/17 0015  ceFEPIme (MAXIPIME) 2 g in dextrose 5 % 50 mL IVPB     2 g 100 mL/hr over 30 Minutes Intravenous  Once 04/25/17 0001 04/25/17 0119   04/25/17 0015  vancomycin (VANCOCIN) IVPB 1000 mg/200 mL premix     1,000 mg 200 mL/hr over 60 Minutes Intravenous  Once 04/25/17 0001 04/25/17 0259   04/25/17 0015  azithromycin (ZITHROMAX) 500 mg in dextrose 5 % 250 mL IVPB  Status:  Discontinued     500 mg 250 mL/hr over 60 Minutes Intravenous Daily at bedtime 04/25/17 0009 04/29/17 1100       Subjective: Patient seen and examined at bedside.  Denies any new complaints.  No overnight fever, nausea or vomiting.  No worsening shortness of breath or cough.  Objective: Vitals:   05/18/17 2116 05/18/17 2237 05/19/17 0342 05/19/17 0344  BP:  (!) 108/58 (!) 105/56   Pulse:  82 73   Resp:   20   Temp:   97.7 F (36.5 C)     TempSrc:   Oral   SpO2: 92%  91%   Weight:    68.9 kg (151 lb 14.4 oz)  Height:        Intake/Output Summary (Last 24 hours) at 05/19/2017 0940 Last data filed at 05/19/2017 0700 Gross per 24 hour  Intake 770 ml  Output 2490 ml  Net -1720 ml   Filed Weights   05/14/17 0500 05/15/17 0506 05/19/17 0344  Weight: 71.2 kg (157 lb) 70.3 kg (155 lb) 68.9 kg (151 lb 14.4 oz)    Examination:  General exam: Appears calm and comfortable.  No acute distress.  Looks cachectic Respiratory system: Bilateral decreased breath sound at bases with scattered crackles.  Left-sided chest tube present Cardiovascular system: S1 & S2 heard, rate controlled  gastrointestinal system: Abdomen is nondistended, soft and nontender. Normal bowel sounds heard. Extremities: No cyanosis, clubbing, edema    Data Reviewed: I have personally reviewed following labs and imaging studies  CBC: Recent Labs  Lab 05/14/17 0405 05/18/17 0442 05/19/17 0455  WBC 7.7 6.1 6.4  NEUTROABS  --   --  5.1  HGB 9.6* 10.1* 10.4*  HCT 30.4* 32.2* 32.3*  MCV 94.1 94.2 93.4  PLT 159 291 601   Basic Metabolic Panel: Recent Labs  Lab 05/12/17 1219 05/14/17 0405 05/16/17 0417 05/18/17 0442 05/19/17 0455  NA 135 135  --  136 135  K 3.4* 3.3*  --  4.0 3.7  CL 92* 91*  --  94* 95*  CO2 34* 36*  --  34* 31  GLUCOSE 347* 223*  --  177* 161*  BUN 16 19  --  19 18  CREATININE 0.47* 0.49* 0.49* 0.47* 0.48*  CALCIUM 7.5* 7.7*  --  8.2* 8.0*  MG  --   --   --   --  2.0   GFR: Estimated Creatinine Clearance: 75.4 mL/min (A) (by C-G formula based on SCr of 0.48 mg/dL (L)). Liver Function Tests: Recent Labs  Lab 05/18/17 0442  AST 31  ALT 75*  ALKPHOS 104  BILITOT 0.4  PROT 5.5*  ALBUMIN 2.1*   No results for input(s): LIPASE, AMYLASE in the last 168 hours. No results for input(s): AMMONIA in the last 168 hours. Coagulation Profile: Recent Labs  Lab 05/18/17 0442  INR 1.12   Cardiac Enzymes: No results for  input(s): CKTOTAL, CKMB, CKMBINDEX, TROPONINI in the last 168 hours. BNP (last 3 results) No results for input(s): PROBNP in the last 8760 hours. HbA1C: No results for input(s): HGBA1C in the last 72 hours. CBG: Recent Labs  Lab 05/18/17 0725 05/18/17 1146 05/18/17 1639 05/18/17 2116 05/19/17 0727  GLUCAP 159* 106* 164* 295* 170*   Lipid Profile: No results for input(s): CHOL, HDL, LDLCALC, TRIG, CHOLHDL, LDLDIRECT in the last 72 hours. Thyroid Function Tests: No results for input(s): TSH, T4TOTAL, FREET4, T3FREE, THYROIDAB in the last 72 hours. Anemia Panel: No results for input(s): VITAMINB12, FOLATE, FERRITIN, TIBC, IRON, RETICCTPCT in the last 72 hours. Sepsis Labs: No results for input(s): PROCALCITON, LATICACIDVEN in the last 168 hours.  No results found for this or any previous visit (from the past 240 hour(s)).       Radiology Studies: No results found.      Scheduled Meds: . amiodarone  400 mg Oral BID  . budesonide (PULMICORT) nebulizer solution  0.25 mg Nebulization BID  . chlorhexidine  15 mL Mouth Rinse BID  . enoxaparin (LOVENOX) injection  40 mg Subcutaneous Q24H  . feeding supplement  1 Container Oral BID BM  . furosemide  20 mg Intravenous Daily  . Gerhardt's butt cream   Topical TID  . insulin aspart  0-9 Units Subcutaneous TID WC  . ipratropium  0.5 mg Nebulization TID  . levalbuterol  1.25 mg Nebulization TID  . mouth rinse  15 mL Mouth Rinse q12n4p  . metoprolol tartrate  25 mg Oral BID  . potassium chloride  30 mEq Oral Daily   Continuous Infusions: . cefTRIAXone (ROCEPHIN)  IV Stopped (05/18/17 1245)     LOS: 24 days        Aline August, MD [facility] Pager [phone]  If 7PM-7AM, please contact night-coverage [email or web address] Password [email or web address] 05/19/2017, 9:40 AM

## 2017-05-19 NOTE — Progress Notes (Signed)
  Radiation Oncology         [phone]) [phone] ________________________________  Name: Maurice Tyler MRN: [id number]  Date: 05/19/2017  DOB: 1940-10-19  End of Treatment Note  Diagnosis:  Stage IV, [id number], NSCLC, squamous cell carcinoma of the left lung with suspicion of malignant effusion     Indication for treatment:  Palliative       Radiation treatment dates: 04/16/17-05/15/17  Site/dose:  Left lung/ 37.5 Gy in 15 fractions  Beams/energy: 3D / 6X, 15X  Narrative: The patient tolerated radiation treatment relatively well. The patient noted his breathing was stable throughout treatment. He was without complaints during treatment.   Plan: The patient has completed radiation treatment. The patient will return to radiation oncology clinic for routine followup in one month. I advised them to call or return sooner if they have any questions or concerns related to their recovery or treatment.  ------------------------------------------------  Jodelle Gross, MD, PhD  This document serves as a record of services personally performed by Kyung Rudd, MD. It was created on his behalf by Bethann Humble, a trained medical scribe. The creation of this record is based on the scribe's personal observations and the provider's statements to them. This document has been checked and approved by the attending provider.

## 2017-05-20 ENCOUNTER — Other Ambulatory Visit: Payer: TRICARE For Life (TFL)

## 2017-05-20 ENCOUNTER — Ambulatory Visit: Payer: Medicare Other

## 2017-05-20 LAB — CBC WITH DIFFERENTIAL/PLATELET
BASOS PCT: 0 %
Basophils Absolute: 0 10*3/uL (ref 0.0–0.1)
EOS ABS: 0.1 10*3/uL (ref 0.0–0.7)
EOS PCT: 1 %
HCT: 31.7 % — ABNORMAL LOW (ref 39.0–52.0)
Hemoglobin: 10.1 g/dL — ABNORMAL LOW (ref 13.0–17.0)
LYMPHS ABS: 0.6 10*3/uL — AB (ref 0.7–4.0)
Lymphocytes Relative: 10 %
MCH: 29.8 pg (ref 26.0–34.0)
MCHC: 31.9 g/dL (ref 30.0–36.0)
MCV: 93.5 fL (ref 78.0–100.0)
MONOS PCT: 9 %
Monocytes Absolute: 0.6 10*3/uL (ref 0.1–1.0)
Neutro Abs: 4.8 10*3/uL (ref 1.7–7.7)
Neutrophils Relative %: 80 %
PLATELETS: 324 10*3/uL (ref 150–400)
RBC: 3.39 MIL/uL — ABNORMAL LOW (ref 4.22–5.81)
RDW: 15.1 % (ref 11.5–15.5)
WBC: 6 10*3/uL (ref 4.0–10.5)

## 2017-05-20 LAB — BASIC METABOLIC PANEL
Anion gap: 9 (ref 5–15)
BUN: 22 mg/dL — AB (ref 6–20)
CALCIUM: 8 mg/dL — AB (ref 8.9–10.3)
CO2: 30 mmol/L (ref 22–32)
CREATININE: 0.5 mg/dL — AB (ref 0.61–1.24)
Chloride: 95 mmol/L — ABNORMAL LOW (ref 101–111)
GFR calc Af Amer: >60 mL/min (ref >60)
Glucose, Bld: 185 mg/dL — ABNORMAL HIGH (ref 65–99)
Potassium: 3.5 mmol/L (ref 3.5–5.1)
SODIUM: 134 mmol/L — AB (ref 135–145)

## 2017-05-20 LAB — GLUCOSE, CAPILLARY
Glucose-Capillary: 149 mg/dL — ABNORMAL HIGH (ref 65–99)
Glucose-Capillary: 168 mg/dL — ABNORMAL HIGH (ref 65–99)

## 2017-05-20 LAB — MAGNESIUM: MAGNESIUM: 2 mg/dL (ref 1.7–2.4)

## 2017-05-20 MED ORDER — FUROSEMIDE 20 MG PO TABS: 20.0000 mg | ORAL_TABLET | Freq: Every day | ORAL | 0 refills | Status: DC

## 2017-05-20 MED ORDER — AMOXICILLIN-POT CLAVULANATE 875-125 MG PO TABS: 1.0000 | ORAL_TABLET | Freq: Two times a day (BID) | ORAL | 0 refills | Status: AC

## 2017-05-20 MED ORDER — IPRATROPIUM BROMIDE 0.02 % IN SOLN: 0.5000 mg | Freq: Two times a day (BID) | RESPIRATORY_TRACT | Status: DC

## 2017-05-20 MED ORDER — AMIODARONE HCL 400 MG PO TABS: 400.0000 mg | ORAL_TABLET | Freq: Two times a day (BID) | ORAL | 0 refills | Status: AC

## 2017-05-20 MED ORDER — LEVALBUTEROL HCL 1.25 MG/0.5ML IN NEBU: 1.2500 mg | INHALATION_SOLUTION | Freq: Two times a day (BID) | RESPIRATORY_TRACT | Status: DC

## 2017-05-20 MED ORDER — POTASSIUM CHLORIDE 20 MEQ/15ML (10%) PO SOLN: 30.0000 meq | Freq: Every day | ORAL | 0 refills | Status: DC

## 2017-05-20 MED ORDER — LORAZEPAM 0.5 MG PO TABS: 0.5000 mg | ORAL_TABLET | Freq: Four times a day (QID) | ORAL | 0 refills | Status: DC | PRN

## 2017-05-20 MED ORDER — METOPROLOL TARTRATE 25 MG PO TABS: 25.0000 mg | ORAL_TABLET | Freq: Two times a day (BID) | ORAL | 0 refills | Status: DC

## 2017-05-20 NOTE — NC FL2 (Signed)
[place] LEVEL OF CARE SCREENING TOOL     IDENTIFICATION  Patient Name: Maurice Tyler Birthdate: [birth year] Sex: male Admission Date (Current Location): 04/24/2017  [facility] and [place] Number:  Herbalist and Address:  [facility],  [address], [place]      Provider Number: [id number]  Attending Physician Name and Address:  Aline August, MD  Relative Name and Phone Number:  Zimir Kittleson, spouse, [phone]    Current Level of Care: Hospital Recommended Level of Care: [place] Prior Approval Number:    Date Approved/Denied:   PASRR Number: [id number]  Discharge Plan: SNF    Current Diagnoses: Patient Active Problem List   Diagnosis Date Noted  . Acute on chronic respiratory failure with hypoxia ([place])   . Chest tube in place   . Empyema ([place])   . Malignant neoplasm of left lung ([place])   . Streptococcal pneumonia ([place])   . New onset a-fib ([place]) 04/30/2017  . AKI (acute kidney injury) ([place]) 04/30/2017  . Non-small cell cancer of left lung ([place])   . Goals of care, counseling/discussion   . Palliative care encounter   . Empyema lung ([place]) 04/29/2017  . Malnutrition of moderate degree 04/28/2017  . Pressure injury of skin 04/28/2017  . Diabetes mellitus type 2 in nonobese ([place]) 04/25/2017  . Acute respiratory failure with hypoxia and hypercapnia ([place]) 04/25/2017  . Severe sepsis ([place]) 04/25/2017  . Multifocal pneumonia 04/25/2017  . Malignant neoplasm of bronchus of left upper lobe ([place]) 03/30/2017  . Stage IV squamous cell carcinoma of left lung ([place]) 03/30/2017    Orientation RESPIRATION BLADDER Height & Weight     Self, Time, Place, Situation  O2 Plurex Drain  Continent  Indwelling catheter Weight: 151 lb 14.4 oz (68.9 kg) Height:  5\' 11"  (180.3 cm)  BEHAVIORAL SYMPTOMS/MOOD NEUROLOGICAL BOWEL NUTRITION STATUS      Incontinent Diet (see dc summary)  AMBULATORY STATUS  COMMUNICATION OF NEEDS Skin tear; Arm; Back (Foam Dressing)  Stage II Pressure Injury  Buttocks Barrier Cream    Extensive Assist Verbally                 Personal Care Assistance Level of Assistance  Bathing, Feeding, Dressing Bathing Assistance: Maximum assistance Feeding Assistance: Limited Assistance Dressing Assistance: Maximum assistance     Functional Limitations Info  Sight, Hearing, Speech Sight Info: Adequate Hearing Info: Adequate Speech Info: Adequate    SPECIAL CARE FACTORS FREQUENCY        PT Frequency: 5x week              Contractures Contractures Info: Not present    Additional Factors Info  Code Status, Allergies, Insulin Sliding Scale Code Status: DNR Allergies Info: No Known Allergies   Insulin Sliding Scale Info: Insulin daily       Current Medications (05/20/2017):  This is the current hospital active medication list Current Facility-Administered Medications  Medication Dose Route Frequency Provider Last Rate Last Dose  . acetaminophen (TYLENOL) tablet 650 mg  650 mg Oral Q6H PRN Rise Patience, MD       Or  . acetaminophen (TYLENOL) suppository 650 mg  650 mg Rectal Q6H PRN Rise Patience, MD      . amiodarone (PACERONE) tablet 400 mg  400 mg Oral BID Minus Breeding, MD   400 mg at 05/19/17 2213  . budesonide (PULMICORT) nebulizer solution 0.25 mg  0.25 mg Nebulization BID Rise Patience, MD  0.25 mg at 05/19/17 2146  . cefTRIAXone (ROCEPHIN) 2 g in dextrose 5 % 50 mL IVPB  2 g Intravenous Q24H Michel Bickers, MD 100 mL/hr at 05/19/17 1300 2 g at 05/19/17 1300  . chlorhexidine (PERIDEX) 0.12 % solution 15 mL  15 mL Mouth Rinse BID Elodia Florence., MD   15 mL at 05/19/17 2213  . enoxaparin (LOVENOX) injection 40 mg  40 mg Subcutaneous Q24H Saverio Danker, PA-C   40 mg at 05/19/17 2210  . feeding supplement (BOOST / RESOURCE BREEZE) liquid 1 Container  1 Container Oral BID BM Marene Lenz, MD   1 Container at  05/19/17 2218  . furosemide (LASIX) tablet 20 mg  20 mg Oral Daily Alekh, Kshitiz, MD      . Gerhardt's butt cream   Topical TID Robbie Lis, MD      . insulin aspart (novoLOG) injection 0-9 Units  0-9 Units Subcutaneous TID WC Rise Patience, MD   1 Units at 05/19/17 1721  . ipratropium (ATROVENT) nebulizer solution 0.5 mg  0.5 mg Nebulization BID Alekh, Kshitiz, MD      . levalbuterol (XOPENEX) nebulizer solution 0.63 mg  0.63 mg Nebulization Q2H PRN [name], MD   0.63 mg at 05/06/17 1537  . levalbuterol (XOPENEX) nebulizer solution 1.25 mg  1.25 mg Nebulization BID Alekh, Kshitiz, MD      . lip balm (CARMEX) ointment   Topical PRN Barton Dubois, MD      . loperamide (IMODIUM) capsule 2 mg  2 mg Oral TID PRN Rise Patience, MD   2 mg at 05/06/17 6812  . LORazepam (ATIVAN) tablet 0.5 mg  0.5 mg Oral Q6H PRN Patrecia Pour, Christean Grief, MD   0.5 mg at 05/17/17 2149  . MEDLINE mouth rinse  15 mL Mouth Rinse q12n4p Elodia Florence., MD   15 mL at 05/19/17 1600  . metoprolol tartrate (LOPRESSOR) injection 5 mg  5 mg Intravenous Q8H PRN Marene Lenz, MD   5 mg at 05/07/17 1907  . metoprolol tartrate (LOPRESSOR) tablet 25 mg  25 mg Oral BID Marene Lenz, MD   25 mg at 05/19/17 2212  . ondansetron (ZOFRAN) tablet 4 mg  4 mg Oral Q6H PRN Rise Patience, MD       Or  . ondansetron [facility]) injection 4 mg  4 mg Intravenous Q6H PRN Rise Patience, MD      . potassium chloride 20 MEQ/15ML (10%) solution 30 mEq  30 mEq Oral Daily Alekh, Kshitiz, MD      . prochlorperazine (COMPAZINE) tablet 10 mg  10 mg Oral Q6H PRN Rise Patience, MD         Discharge Medications: Please see discharge summary for a list of discharge medications.  Relevant Imaging Results:  Relevant Lab Results:   Additional Information SS#: [id number]  [place], LCSW

## 2017-05-20 NOTE — Progress Notes (Signed)
Referring Physician(s): Ramaswamy,M  Supervising Physician: Aletta Edouard  Patient Status:  [facility] - In-pt  Chief Complaint:  Recurrent left pleural effusion, hydropneumothorax  Subjective: Pt without new c/o this am; denies worsening dyspnea or CP   Allergies: Patient has no known allergies.  Medications: Prior to Admission medications   Medication Sig Start Date End Date Taking? Authorizing Provider  acetaminophen (TYLENOL) 500 MG tablet Take 1,000 mg by mouth every 6 (six) hours as needed for moderate pain.   Yes [provider]  albuterol (PROVENTIL HFA;VENTOLIN HFA) 108 (90 Base) MCG/ACT inhaler Inhale 1 puff every 6 (six) hours as needed into the lungs for wheezing or shortness of breath.   Yes [provider]  loperamide (IMODIUM A-D) 2 MG tablet Take 2 mg by mouth 3 (three) times daily as needed for diarrhea or loose stools.   Yes [provider]  metFORMIN (GLUCOPHAGE) 500 MG tablet Take 500 mg 2 (two) times daily with a meal by mouth.    Yes [provider]  methylPREDNISolone (MEDROL DOSEPAK) 4 MG TBPK tablet Take per package instructions Patient taking differently: as directed. Take 6 tablets on Day 1, Take 5 tablets on Day 2, Take 4 tablets on Day 3, Take 3 tablets on Day 4, Take 2 tablets on Day 5 and take 1 tablet on Day 6. 04/24/17  Yes Curt Bears, MD  prochlorperazine (COMPAZINE) 10 MG tablet Take 1 tablet (10 mg total) by mouth every 6 (six) hours as needed for nausea or vomiting. 04/24/17  Yes Kyung Rudd, MD  ACCU-CHEK FASTCLIX LANCETS [place]  03/19/17   [provider]  ACCU-CHEK GUIDE test strip USE TO CHECK BLOOD SUGAR BID 03/23/17   [provider]  cetirizine (ZYRTEC) 10 MG tablet Take 10 mg daily by mouth. ALLERTEC    [provider]  nystatin (MYCOSTATIN) 100000 UNIT/ML suspension SWISH AND SWALLOW 5 MLS PO  AC AND QHS FOR 7 DAYS 03/31/17   [provider]     Vital  Signs: BP 104/60 (BP Location: Right Arm)   Pulse 72   Temp 97.7 F (36.5 C) (Oral)   Resp 18   Ht 5\' 11"  (1.803 m)   Wt 151 lb 14.4 oz (68.9 kg)   SpO2 99%   BMI 21.19 kg/m   Physical Exam left Pleurx catheter intact and capped, dressing dry, site mildly tender  Imaging: Ir Lenise Arena W Catheter Placement  Result Date: 05/19/2017 INDICATION: Stage IV lung cancer.  Left pleural effusion. EXAM: LEFT PLEURX MEDICATIONS: The patient is currently admitted to the hospital and receiving intravenous antibiotics. The antibiotics were administered within an appropriate time frame prior to the initiation of the procedure. ANESTHESIA/SEDATION: Fentanyl 100 mcg IV; Versed 2 mg IV Moderate Sedation Time:  17 The patient was continuously monitored during the procedure by the interventional radiology nurse under my direct supervision. COMPLICATIONS: None immediate. PROCEDURE: Informed written consent was obtained from the patient after a thorough discussion of the procedural risks, benefits and alternatives. All questions were addressed. Maximal Sterile Barrier Technique was utilized including caps, mask, sterile gowns, sterile gloves, sterile drape, hand hygiene and skin antiseptic. A timeout was performed prior to the initiation of the procedure. The left chest was prepped and draped in a sterile fashion. 1% lidocaine was utilized for local anesthesia. The existing chest tube was exchanged over an Amplatz wire for the peel-away sheath. A second incision was made 10 cm anterior to the chest tube insertion site. A PleurX  drain was then tunneled from the second incision out the chest tube insertion site. It was then fed through the peel-away sheath. The peel-away sheath was removed. The cuff was positioned in the subcutaneous tract. The initial puncture site incision was closed with a for 3 0 Vicryl subcutaneous stitch and 4 0 Vicryl subcuticular stitch. The second incision was closed with an 0 Prolene  pursestring knot. FINDINGS: Images demonstrate placement of a left PleurX drain. IMPRESSION: Successful exchange of the left chest tube for a PleurX drain. Electronically Signed   By: Marybelle Killings M.D.   On: 05/19/2017 16:27    Labs:  CBC: Recent Labs    05/14/17 0405 05/18/17 0442 05/19/17 0455 05/20/17 0350  WBC 7.7 6.1 6.4 6.0  HGB 9.6* 10.1* 10.4* 10.1*  HCT 30.4* 32.2* 32.3* 31.7*  PLT 159 291 306 324    COAGS: Recent Labs    05/08/17 0422 05/18/17 0442  INR 1.28 1.12    BMP: Recent Labs    05/14/17 0405 05/16/17 0417 05/18/17 0442 05/19/17 0455 05/20/17 0350  NA 135  --  136 135 134*  K 3.3*  --  4.0 3.7 3.5  CL 91*  --  94* 95* 95*  CO2 36*  --  34* 31 30  GLUCOSE 223*  --  177* 161* 185*  BUN 19  --  19 18 22*  CALCIUM 7.7*  --  8.2* 8.0* 8.0*  CREATININE 0.49* 0.49* 0.47* 0.48* 0.50*  GFRNONAA >60 >60 >60 >60 >60  GFRAA >60 >60 >60 >60 >60    LIVER FUNCTION TESTS: Recent Labs    04/26/17 0311  05/06/17 0316 05/09/17 0730 05/10/17 0458 05/18/17 0442  BILITOT 0.7  --  0.6 0.6  --  0.4  AST 25  --  16 20  --  31  ALT 22  --  32 21  --  75*  ALKPHOS 79  --  184* 94  --  104  PROT 5.7*  --  4.8* 4.2*  --  5.5*  ALBUMIN 2.5*   < > 1.7* 1.6* 1.8* 2.1*   < > = values in this interval not displayed.    Assessment and Plan: Pt with hx stage IV NSC lung ca, recurrent left pleural effusion/chronic hydroptx/empyema; s/p left chest drain 1/4; left chest pigtail catheter removed and left Pleurx catheter inserted on 05/19/17; afebrile, WBC normal, hemoglobin 10.1, creatinine 0.5; most recent pleural fluid cultures negative; plans per primary care/pulmonology; may drain Pleurx as needed-there is no standardized schedule of drainage necessary-base fluid removal on patient's symptoms.Call [phone] with any questions.      Electronically Signed: D. Rowe Robert, PA-C 05/20/2017, 10:50 AM   I spent a total of 15 minutes at the the patient's bedside AND  on the patient's hospital floor or unit, greater than 50% of which was counseling/coordinating care for left chest pleurx catheter    Patient ID: Maurice Tyler, male   DOB: 01/27/1941, 77 y.o.   MRN: [id number]

## 2017-05-20 NOTE — Discharge Summary (Signed)
Physician Discharge Summary  Maurice Tyler POE:[id number] DOB: August 16, 1940 DOA: 04/24/2017  PCP: [name], MD  Admit date: 04/24/2017 Discharge date: 05/20/2017  Admitted From: Home Disposition:  SNF  Recommendations for Outpatient Follow-up:  1. Follow up with nursing home provider at earliest convenience 2. Follow-up with oncology/Dr. [name] at earliest convenience 3. Follow-up with interventional radiology with concerns regarding Pleurx catheter 4. Follow-up with cardiology in 1-2 weeks 5. Follow-up with pulmonary in 1-2 weeks 6. Patient will need outpatient evaluation by urology  Home Health: No Equipment/Devices: Pleurx catheter  Discharge Condition: Guarded CODE STATUS: DNR Diet recommendation: Heart Healthy / Carb Modified    Brief/Interim Summary: 77 year old male with history of stage IV non-small cell lung cancer on chemotherapy, diabetes mellitus presented with shortness of breath and cough.  Patient is currently being managed for multifocal streptococcal pneumonia and large left hydropneumothorax.  Patient underwent placement of left pigtail catheter on 05/08/2017.  Patient underwent Pleurx catheter placement by interventional radiology on 05/19/2017.  He will be discharged to nursing home today. He is currently on Rocephin and will be discharged on Augmentin to finish the course of therapy.  He also developed atrial fibrillation with rapid ventricular rate for which he is on amiodarone and metoprolol as per cardiology recommendations.  He needs outpatient follow-up with oncology, cardiology, pulmonary and interventional radiology if needed.   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)   Diabetes mellitus type 2 in nonobese (HCC)   Acute respiratory failure with hypoxia and hypercapnia (HCC)   Severe sepsis (HCC)   Malnutrition of moderate degree   Pressure injury of  skin   Empyema lung (HCC)   New onset a-fib (HCC)   AKI (acute kidney injury) ([place])   Non-small cell cancer of left lung (HCC)   Goals of care, counseling/discussion   Palliative care encounter   Streptococcal pneumonia ([place])   Malignant neoplasm of left lung ([place])   Acute on chronic respiratory failure with hypoxia (HCC)   Chest tube in place   Empyema ([place])  Acute respiratory failure with hypoxia and hypercarbia / Sepsis secondary to streptococcal multifocal pneumonia / Empyema w +/- malignant effusion  - CXR on admission showed left perihilar soft tissue mass consistent with known malignancy. - Pt is status post thoracentesis 04/26/17 - 1.4 L fluid removed; thoracentesis 04/30/17 - 2.3 L fluid, 05/06/17 0.5L removed. fluid analysis consistent with empyema.  -s/p Pigtail chest tube placement 05/08/17 by IR -continued to havehigh output onchest tube output. -Blood cx negative  -Pleural fluid culture grew strep viridans : Per ID recommendation continue Rocephin while inpatient, upon discharge can change to oral formulation to complete 28 days given empyema.  Will discharge on oral Augmentin for 10 more days. - PCCM recommended Pleurx catheter placement.  Status post Pleurx catheter placement on 05/19/2017.  Outpatient follow-up with pulmonary and interventional radiology as needed regarding needs for Pleurx catheter  -Wean off oxygen as able.  -Not previously on O2 supplement at home   Leukocytosis: due to above -Resolved -Antibiotic plan as above  Edema-  -Hypoproteinemia, 3rd spacing. -Was on intravenous Lasix, currently on oral Lasix. -Monitor creatinine intermittently while on Lasix  -overall swelling is well controlled/resolved. -continue improving nutrition  Metastatic stage IV lung cancer -Follows with Dr. [name] -continue outpatient follow up -per patientreport; no further chemotherapyor treatment recommended -Outpatient evaluation and follow-up by palliative  care team -Palliative care was involved during  current hospitalization.  Afib with RVR - stable  -rate controlled -CHA2DS2-VASc Score 2 -will continue amiodarone and metoprolol -Cardiology has seen the patient and recommended against anticoagulation given new afib and significant commorbidities (see 04/30/17  cardiology note).  Outpatient follow-up with cardiology  AKI - Resolved  -probably due to sepsis -Outpatient follow-up.  Diabetes type 2 without complications- Stable  -Continue metformin.  Outpatient follow-up  Moderate to severeprotein calorie malnutrition now -In the setting of chronic illness -Follow nutritional service recommendations  Skin pressure injury, stage 1(left armpit) / Stage 2 sacral ulcer -Follow wound care service recommendations  Urinary retention  -Unclear etiology -Patient has failed voiding trial; bladder scan with more than 900 cc urine accumulated on 05/13/17 -foley replaced at that time -no issues and good urine output seen since then. -will require continue foley at discharge and follow up with urology as an outpatient. -Normal PSA  -renal US done on 05/09/17 demonstrated no hydronephrosis or obstructive uropathy.      Discharge Instructions  Discharge Instructions    Ambulatory referral to Cardiology   Complete by:  As directed    New Afib   Ambulatory referral to Interventional Radiology   Complete by:  As directed    S/P Pleurx drain   Ambulatory referral to Oncology   Complete by:  As directed    Recent hospitalization   Ambulatory referral to Pulmonology   Complete by:  As directed    Recent hospitalization for empyema; now has pleurx drain   Call MD for:  difficulty breathing, headache or visual disturbances   Complete by:  As directed    Call MD for:  extreme fatigue   Complete by:  As directed    Call MD for:  hives   Complete by:  As directed    Call MD for:  persistant dizziness or light-headedness   Complete  by:  As directed    Call MD for:  persistant nausea and vomiting   Complete by:  As directed    Call MD for:  severe uncontrolled pain   Complete by:  As directed    Call MD for:  temperature >100.4   Complete by:  As directed    Diet - low sodium heart healthy   Complete by:  As directed    Diet Carb Modified   Complete by:  As directed    Increase activity slowly   Complete by:  As directed      Allergies as of 05/20/2017   No Known Allergies     Medication List    STOP taking these medications   methylPREDNISolone 4 MG Tbpk tablet Commonly known as:  MEDROL DOSEPAK   nystatin 100000 UNIT/ML suspension Commonly known as:  MYCOSTATIN     TAKE these medications   ACCU-CHEK FASTCLIX LANCETS Misc   ACCU-CHEK GUIDE test strip Generic drug:  glucose blood USE TO CHECK BLOOD SUGAR BID   acetaminophen 500 MG tablet Commonly known as:  TYLENOL Take 1,000 mg by mouth every 6 (six) hours as needed for moderate pain.   albuterol 108 (90 Base) MCG/ACT inhaler Commonly known as:  PROVENTIL HFA;VENTOLIN HFA Inhale 1 puff every 6 (six) hours as needed into the lungs for wheezing or shortness of breath.   amiodarone 400 MG tablet Commonly known as:  PACERONE Take 1 tablet (400 mg total) by mouth 2 (two) times daily.   amoxicillin-clavulanate 875-125 MG tablet Commonly known as:  AUGMENTIN Take 1 tablet by mouth every 12 (twelve)  hours for 10 days.   cetirizine 10 MG tablet Commonly known as:  ZYRTEC Take 10 mg daily by mouth. ALLERTEC   furosemide 20 MG tablet Commonly known as:  LASIX Take 1 tablet (20 mg total) by mouth daily.   loperamide 2 MG tablet Commonly known as:  IMODIUM A-D Take 2 mg by mouth 3 (three) times daily as needed for diarrhea or loose stools.   LORazepam 0.5 MG tablet Commonly known as:  ATIVAN Take 1 tablet (0.5 mg total) by mouth every 6 (six) hours as needed for anxiety.   metFORMIN 500 MG tablet Commonly known as:  GLUCOPHAGE Take 500  mg 2 (two) times daily with a meal by mouth.   metoprolol tartrate 25 MG tablet Commonly known as:  LOPRESSOR Take 1 tablet (25 mg total) by mouth 2 (two) times daily.   potassium chloride 20 MEQ/15ML (10%) Soln Take 22.5 mLs (30 mEq total) by mouth daily.   prochlorperazine 10 MG tablet Commonly known as:  COMPAZINE Take 1 tablet (10 mg total) by mouth every 6 (six) hours as needed for nausea or vomiting.      Follow-up Information    [name], MD. Schedule an appointment as soon as possible for a visit in 1 week(s).   Specialty:  Internal Medicine Contact information: [place] [address] [phone]        Minus Breeding, MD Follow up.   Specialty:  Cardiology Contact information: [address] [place] [address] [phone]        [place]. Schedule an appointment as soon as possible for a visit.   Specialty:  Radiology Why:  for issues with pleurx catheter Contact information: [place] [id number] [place] [phone]       Juanito Doom, MD. Schedule an appointment as soon as possible for a visit in 1 week(s).   Specialty:  Pulmonary Disease Contact information: [place] [address] [phone]          No Known Allergies  Consultations:  IR  PCCM  ID   Palliative   Cardiology       Procedures/Studies: Dg Chest 1 View  Result Date: 05/10/2017 CLINICAL DATA:  77 year old male with left-sided chest tube EXAM: CHEST 1 VIEW COMPARISON:  Prior chest x-ray obtained yesterday; CT scan of the chest 05/06/2017 FINDINGS: Stable position of left basilar percutaneous thoracostomy tube. Persistent large left-sided loculated pneumothorax. Perhaps minimal improvement in the basilar component. The entire left lung remains atelectatic. Stable moderate layering right-sided pleural effusion with associated  right basilar atelectasis. Unchanged cardiac mediastinal contours. Atherosclerotic calcifications in the transverse aorta. Background pulmonary emphysema. No acute osseous abnormality. IMPRESSION: 1. Stable chest x-ray with left basilar chest tube in place. Minimal change in the large left-sided loculated pneumothorax which is likely ex vacuo in nature and due to failure of expansion of the atelectatic left lung. 2. Stable moderate layering right pleural effusion. Electronically Signed   By: Jacqulynn Cadet M.D.   On: 05/10/2017 12:33   Dg Chest 1 View  Result Date: 05/06/2017 CLINICAL DATA:  Status post left thoracentesis EXAM: CHEST 1 VIEW COMPARISON:  05/04/2017 FINDINGS: Stable hydropneumothorax is noted on the left. Considerable consolidated lung is again identified and stable from previous exams. The right lung is clear. Cardiac shadow is stable. No bony abnormality is seen. IMPRESSION: Stable left hydropneumothorax. Electronically Signed   By: Inez Catalina M.D.   On: 05/06/2017  15:25   Dg Chest 1 View  Result Date: 04/29/2017 CLINICAL DATA:  History diabetes. History of smoking. Follow-up exam. Recent thoracentesis. EXAM: CHEST 1 VIEW COMPARISON:  None. FINDINGS: Stable cardiomegaly. Diffuse left lung infiltrate. Left pleural effusion appears to have recurred. Residual left apical pneumothorax may remain. IMPRESSION: 1. Large left pleural effusion appears to have recurred. Residual left apical pneumothorax may remain. 2. Persistent left lung infiltrate. 3. Stable cardiomegaly . Electronically Signed   By: Marcello Moores  Register   On: 04/29/2017 14:53   Dg Chest 2 View  Result Date: 04/24/2017 CLINICAL DATA:  Chronic shortness of breath, productive cough EXAM: CHEST  2 VIEW COMPARISON:  04/08/2017 FINDINGS: Large left perihilar mass. Moderate left pleural effusion which appears partially loculated. Right lung is clear. Stable cardiomediastinal silhouette. Thoracic aortic atherosclerosis. No acute  osseous abnormality. IMPRESSION: Left perihilar soft tissue mass consistent with known malignancy. Moderate left pleural effusion improved compared with 04/08/2017. Electronically Signed   By: Kathreen Devoid   On: 04/24/2017 21:43   Ct Chest Wo Contrast  Result Date: 05/06/2017 CLINICAL DATA:  LEFT empyema, stage IV LEFT lung cancer diabetes mellitus EXAM: CT CHEST WITHOUT CONTRAST TECHNIQUE: Multidetector CT imaging of the chest was performed following the standard protocol without IV contrast. Sagittal and coronal MPR images reconstructed from axial data set. COMPARISON:  04/30/2017 ; chest radiographs 05/06/2017, 05/04/2017 and earlier FINDINGS: Cardiovascular: Atherosclerotic calcifications aorta and proximal great vessels. Small pericardial effusion. Aorta normal caliber. Mediastinum/Nodes: Base of cervical region unremarkable. Proximal thoracic esophagus normal appearance with esophagus contiguous with tumor from LEFT lung with which invades the mediastinum. No esophageal dilatation. No definite thoracic adenopathy, though LEFT hilar assessment is severely limited. Lungs/Pleura: Complete collapse of the LEFT lung since prior exam with again identified large LEFT hydropneumothorax. Associated depression of the LEFT diaphragm. Abnormal soft tissue at the medial RIGHT lung extending from upper lobe into mediastinum, abutting the esophagus, LEFT pulmonary artery, and descending thoracic aorta. Abrupt cut off of the LEFT mainstem bronchus by tumor. Small to moderate RIGHT pleural effusion with compressive atelectasis of the RIGHT lower lobe. Underlying emphysematous changes. Scattered respiratory motion artifacts. Central peribronchial thickening. No definite pulmonary mass/nodule or acute infiltrate. Upper Abdomen: Dependent gallstones in gallbladder. Mild collecting system dilatation at upper pole RIGHT kidney. Atrophic pancreas. Remaining visualized upper abdomen unremarkable. Musculoskeletal: No acute osseous  findings IMPRESSION: Large LEFT hydropneumothorax with depression of LEFT diaphragm as noted on prior chest radiographs. Probable large tumor mass at the central LEFT lung invading the mediastinum and abutting the esophagus, descending thoracic aorta, and LEFT pulmonary artery, with associated with abrupt cut off of the LEFT mainstem bronchus and complete collapse of the LEFT lung progressive since prior exam. Small pericardial effusion. Increased RIGHT pleural effusion and compressive atelectasis of the posterior RIGHT lung since prior exam. Question RIGHT hydronephrosis. Cholelithiasis. Aortic Atherosclerosis (ICD10-I70.0). Emphysema (ICD10-J43.9). Electronically Signed   By: Lavonia Dana M.D.   On: 05/06/2017 22:01   Ct Chest W Contrast  Result Date: 04/30/2017 CLINICAL DATA:  Inpatient. 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 Patient admitted with multifocal pneumonia, large left pleural effusion, sepsis and respiratory failure. Status post left thoracentesis earlier today. EXAM: CT CHEST WITH CONTRAST TECHNIQUE: Multidetector CT imaging of the chest was performed during intravenous contrast administration. CONTRAST:  86mL ISOVUE-300 IOPAMIDOL (ISOVUE-300) INJECTION 61% COMPARISON:  Chest radiograph from earlier today. 04/24/2017 chest CT angiogram. FINDINGS: Cardiovascular: Normal heart size. Trace  pericardial effusion/thickening, stable. Atherosclerotic nonaneurysmal thoracic aorta. Normal caliber main pulmonary artery. No central pulmonary emboli. Significant extrinsic narrowing of the central left pulmonary artery is by tumor is not appreciably changed since 04/24/2017. Mediastinum/Nodes: No discrete thyroid nodules. No acute esophageal abnormality. No axillary adenopathy. Asymmetric mildly prominent 0.7 cm left internal mammary node (series 2/ image 58), previously 0.5 cm on 04/24/2017, minimally increased.  Mildly prominent 0.9 cm left pericardiophrenic node (series 2/ image 123), previously 0.5 cm on 04/24/2017, mildly increased. No right hilar adenopathy. Lungs/Pleura: There is a poorly marginated infiltrative partially cavitary central left lung mass measuring approximately 10.9 x 6.6 cm (series 2/ image 74), which is contiguous with the left hilar and left subcarinal nodal territories, which appears mildly increased from 10.6 x 5.7 cm on 04/24/2017 using similar measurement technique. Persistent occluded left upper lobe bronchus with complete left upper lobe atelectasis. Patchy consolidation and volume loss throughout the left lower lobe has worsened since 04/24/2017. Patchy consolidation and ground-glass attenuation in the dependent right upper lobe and dependent right middle lobe and patchy ground-glass centrilobular nodularity in the right lower lobe of fall worsened since 04/24/2017 chest CT. Large loculated left hydropneumothorax with multiple fluid levels, overall stable in size since 04/24/2017 chest CT, with new left pneumothorax component and decreased left pleural fluid component. Trace dependent right pleural effusion with mild dependent right lower lobe atelectasis, new. Underlying moderate centrilobular and paraseptal emphysema with diffuse bronchial wall thickening. Stable calcified subcentimeter dependent basilar right lower lobe granuloma. Upper abdomen: Cholelithiasis. Musculoskeletal:  No aggressive appearing focal osseous lesions. IMPRESSION: 1. Worsening bilateral multilobar pneumonia. 2. Large left hydropneumothorax, overall stable in size since 04/24/2017 chest CT, with new left pneumothorax component and with decreased left pleural fluid component status post interval thoracentesis. 3. Infiltrative poorly marginated partially cavitary central left lung malignancy with left hilar and subcarinal mediastinal invasion, apparently mildly increased in size in the interval. Stable occluded left  upper lobe bronchus with complete left upper lobe atelectasis. 4. Nonspecific mild left internal mammary and left pericardiophrenic mediastinal adenopathy is mildly increased. 5. New trace dependent right pleural effusion. 6. Cholelithiasis. Aortic Atherosclerosis (ICD10-I70.0) and Emphysema (ICD10-J43.9). Electronically Signed   By: Ilona Sorrel M.D.   On: 04/30/2017 12:37   Ct Angio Chest Pe W/cm &/or Wo Cm  Result Date: 04/24/2017 CLINICAL DATA:  Acute onset of shortness of breath, cough and generalized weakness. Known lung cancer, status post radiation therapy. EXAM: CT ANGIOGRAPHY CHEST WITH CONTRAST TECHNIQUE: Multidetector CT imaging of the chest was performed using the standard protocol during bolus administration of intravenous contrast. Multiplanar CT image reconstructions and MIPs were obtained to evaluate the vascular anatomy. CONTRAST:  168mL ISOVUE-370 IOPAMIDOL (ISOVUE-370) INJECTION 76% COMPARISON:  Chest radiograph performed earlier today at 9:11 p.m., and PET/CT performed 03/23/2017 FINDINGS: Cardiovascular: There is no definite evidence of pulmonary embolus. Evaluation for pulmonary embolus is suboptimal due to motion artifact and in areas of airspace consolidation. The heart is normal in size. Scattered calcification is noted along the aortic arch. The great vessels are grossly unremarkable in appearance, aside from mild mural thrombus along the left subclavian artery. Mediastinum/Nodes: A large mass is again noted along the left side of the mediastinum, completely effacing the left mainstem bronchus and markedly compressing the left main pulmonary artery and its branches. The mass is difficult to fully characterize due to the phase of contrast enhancement, but measures at least 6.5 cm size. Trace pericardial fluid remains within normal limits. No additional mediastinal lymphadenopathy is  seen. The thyroid gland is unremarkable. No axillary lymphadenopathy is appreciated. Lungs/Pleura: A  moderate to large left-sided pleural effusion is noted, improved from the prior study. Diffuse airspace opacity is noted within the left upper and lower lobes, compatible with pneumonia. Underlying central cavitary lung mass is noted, with air-fluid levels and nodular extension into the left lung apex. Patchy airspace opacities within the right middle lobe are also concerning for pneumonia. Underlying emphysema is noted at the right upper lobe. No pneumothorax is seen. Upper Abdomen: The visualized portions of the liver and spleen are unremarkable. The visualized portions of the pancreas, adrenal glands and kidneys are within normal limits. Musculoskeletal: No acute osseous abnormalities are identified. The visualized musculature is unremarkable in appearance. Review of the MIP images confirms the above findings. IMPRESSION: 1. No definite evidence of pulmonary embolus. 2. Diffuse airspace opacities within the left upper and lower lung lobes, and mild patchy airspace opacities within the right middle lobe, compatible with multifocal pneumonia. 3. Central cavitary lung mass noted at the left lung, with air-fluid levels and nodular extension into the left lung apex. Associated large mass along the left side of the mediastinum, completely effacing the left mainstem bronchus and markedly compressing the left main pulmonary artery and its branches. This reflects the patient's known lung cancer. 4. Moderate to large left-sided pleural effusion is improved from the prior study. 5. Underlying emphysema noted at the right upper lobe. Electronically Signed   By: Garald Balding M.D.   On: 04/24/2017 23:49   Ir Guided Niel Hummer W Catheter Placement  Result Date: 05/19/2017 INDICATION: Stage IV lung cancer.  Left pleural effusion. EXAM: LEFT PLEURX MEDICATIONS: The patient is currently admitted to the hospital and receiving intravenous antibiotics. The antibiotics were administered within an appropriate time frame prior to the  initiation of the procedure. ANESTHESIA/SEDATION: Fentanyl 100 mcg IV; Versed 2 mg IV Moderate Sedation Time:  17 The patient was continuously monitored during the procedure by the interventional radiology nurse under my direct supervision. COMPLICATIONS: None immediate. PROCEDURE: Informed written consent was obtained from the patient after a thorough discussion of the procedural risks, benefits and alternatives. All questions were addressed. Maximal Sterile Barrier Technique was utilized including caps, mask, sterile gowns, sterile gloves, sterile drape, hand hygiene and skin antiseptic. A timeout was performed prior to the initiation of the procedure. The left chest was prepped and draped in a sterile fashion. 1% lidocaine was utilized for local anesthesia. The existing chest tube was exchanged over an Amplatz wire for the peel-away sheath. A second incision was made 10 cm anterior to the chest tube insertion site. A PleurX drain was then tunneled from the second incision out the chest tube insertion site. It was then fed through the peel-away sheath. The peel-away sheath was removed. The cuff was positioned in the subcutaneous tract. The initial puncture site incision was closed with a for 3 0 Vicryl subcutaneous stitch and 4 0 Vicryl subcuticular stitch. The second incision was closed with an 0 Prolene pursestring knot. FINDINGS: Images demonstrate placement of a left PleurX drain. IMPRESSION: Successful exchange of the left chest tube for a PleurX drain. Electronically Signed   By: Marybelle Killings M.D.   On: 05/19/2017 16:27   US Renal  Result Date: 05/09/2017 CLINICAL DATA:  77 year old male with urinary retention EXAM: RENAL / URINARY TRACT ULTRASOUND COMPLETE COMPARISON:  None. FINDINGS: Right Kidney: Length: 13.7 cm. Echogenicity is within normal limits. Circumscribed anechoic cystic lesions are present in the lower pole  measuring up to 2.5 and 0.9 cm respectively. Findings are consistent with simple  cysts. No evidence of hydronephrosis or nephrolithiasis. Left Kidney: Length: 13.2 cm. Echogenicity is within normal limits. Circumscribed anechoic simple cyst in the lower pole measures 1.5 cm. Bladder: Foley catheter in the collapsed bladder. Other: Multiple mobile echogenic foci with posterior acoustic shadowing in the gallbladder consistent with cholelithiasis. IMPRESSION: 1. No evidence of hydronephrosis. 2. Bilateral simple renal cysts. 3. Cholelithiasis. Electronically Signed   By: Jacqulynn Cadet M.D.   On: 05/09/2017 15:07   Dg Chest Port 1 View  Result Date: 05/13/2017 CLINICAL DATA:  Pleural effusion.  Chest tube. EXAM: PORTABLE CHEST 1 VIEW COMPARISON:  Multiple priors. FINDINGS: Pigtail catheter LEFT hemithorax and associated LEFT pneumothorax appear similar to yesterday's radiograph. There is poor reinflation of the LEFT lung. Suspected RIGHT lower chest wall skin fold, although a tiny lateral pneumothorax cannot be excluded. No concerning extrapleural air over the apex. Unchanged cardiomediastinal silhouette. IMPRESSION: Poor re-expansion of the LEFT lung. Overall stable LEFT hydropneumothorax. Skin fold versus tiny RIGHT lateral pneumothorax, continued surveillance warranted. Electronically Signed   By: Staci Righter M.D.   On: 05/13/2017 07:08   Dg Chest Port 1 View  Result Date: 05/11/2017 CLINICAL DATA:  Follow-up chest tube EXAM: PORTABLE CHEST 1 VIEW COMPARISON:  05/10/2017 FINDINGS: Cardiac shadow is enlarged but stable. Small bore chest tube is again seen on the left. Large left hydropneumothorax is again noted and stable. No significant aeration of the left lung is seen. Right lung is well aerated with evidence of small right pleural effusion. This is stable from the prior exam. No new focal abnormality is seen. IMPRESSION: Overall stable appearance of the chest when compared with the prior study. Electronically Signed   By: Inez Catalina M.D.   On: 05/11/2017 07:31   Dg Chest Port 1  View  Result Date: 05/09/2017 CLINICAL DATA:  Left pneumothorax EXAM: PORTABLE CHEST 1 VIEW COMPARISON:  Chest x-ray 05/08/2017 CT chest 05/06/2017 FINDINGS: Persistent large left hydropneumothorax with partial loculated fluid in the mid portion. Left basilar chest tube in satisfactory position. Diffuse mild right lung interstitial thickening. Small right pleural effusion. No right pneumothorax. Stable cardiomediastinal silhouette. No acute osseous abnormality. IMPRESSION: Stable large left hydropneumothorax with a partially loculated component. Left basilar chest tube in satisfactory unchanged position. 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 On chemotherapy. Now presents with shortness of breath and productive cough. EXAM: PORTABLE CHEST 1 VIEW COMPARISON:  CT chest 05/06/2017.  Chest 05/06/2017. FINDINGS: Large left hydropneumothorax is again demonstrated. There is probable collapse of most to the remaining left lung. Changes could represent malignant effusion or empyema. Since the previous study, there is interval placement of a left chest tube. No significant improvement of aeration of the left lung. Mild cardiac enlargement. Small right pleural effusion. No consolidation on the right. Calcification of the aorta. IMPRESSION: Persistent large left hydropneumothorax with left chest tube in place. Electronically Signed   By: Lucienne Capers M.D.   On: 05/08/2017 18:45   Dg Chest Port 1 View  Result Date: 05/04/2017 CLINICAL DATA:  Patient with history of loculated pleural effusion. EXAM: PORTABLE CHEST 1 VIEW COMPARISON:  Chest radiograph 05/01/2017. FINDINGS: Monitoring leads overlie the patient. Stable cardiomegaly. Re- demonstrated loculated left hydropneumothorax. Residual left lung is  largely obscured. Right basilar opacities favored to  represent atelectasis. Thoracic spine degenerative changes. IMPRESSION: Re- demonstrated large left hydropneumothorax. Residual left parenchymal tissue is largely obscured. Electronically Signed   By: Lovey Newcomer M.D.   On: 05/04/2017 11:44   Dg Chest Port 1 View  Result Date: 05/01/2017 CLINICAL DATA:  Hydropneumothorax. EXAM: PORTABLE CHEST 1 VIEW COMPARISON:  One-view chest x-ray 04/30/2017. CT of the chest 04/30/2017. FINDINGS: A complex left-sided hydropneumothorax is not significantly changed. Complex left-sided airspace disease is again noted. With the patient more upright, the air component is better seen at the apex. The heart size is normal. Interstitial coarsening in the right lung is stable. IMPRESSION: 1. Stable complex left-sided hydropneumothorax and pneumonia. Electronically Signed   By: San Morelle M.D.   On: 05/01/2017 07:09   Dg Chest Port 1 View  Result Date: 04/30/2017 CLINICAL DATA:  Left-sided hydropneumothorax EXAM: PORTABLE CHEST 1 VIEW COMPARISON:  Chest CT April 30, 2016 and chest radiograph April 30, 2016 FINDINGS: Extensive changes of hydropneumothorax on the left remain without apparent change compared to earlier in the day. Cavitary lesion in the left perihilar region is again noted. CT shows this area to represent a cavitary mass. There is airspace consolidation in the left mid lung areas not directly involved with hydropneumothorax. There are patchy areas of opacity in the right mid lung and right base region, better seen on CT. Heart size is normal. The pulmonary vascularity on the right is within normal limits. Pulmonary vascularity on the left is distorted. There is aortic atherosclerosis. No bone lesions. IMPRESSION: Hydropneumothorax remains on the left without appreciable change. Areas of consolidation primarily in the left mid lung remain. Mass with cavitation in the left perihilar region persists without appreciable change compared to earlier in the  day. Patchy infiltrate right mid lung and right base. Stable cardiac silhouette. There is aortic atherosclerosis. Aortic Atherosclerosis (ICD10-I70.0). Electronically Signed   By: Lowella Grip III M.D.   On: 04/30/2017 20:25   Dg Chest Port 1 View  Result Date: 04/30/2017 CLINICAL DATA:  Empyema EXAM: PORTABLE CHEST 1 VIEW COMPARISON:  04/29/2017 FINDINGS: Large left-sided pleural effusion is again identified. There is some lucency noted in the left apex likely representing a component of hydropneumothorax stable from the previous exam. Patchy infiltrative changes are noted throughout the aerated left lung. The right lung is clear. Cardiac shadow is stable. No bony abnormality is noted. IMPRESSION: Stable left pleural effusion and likely component of hydropneumothorax. No new focal abnormality is noted. Stable left-sided infiltrates. Electronically Signed   By: Inez Catalina M.D.   On: 04/30/2017 07:36   Dg Chest Port 1 View  Result Date: 04/26/2017 CLINICAL DATA:  Status post left thoracentesis today. EXAM: PORTABLE CHEST 1 VIEW COMPARISON:  PA and lateral chest 04/24/2017. FINDINGS: Left pleural effusion is decreased after thoracentesis. There is a left pneumothorax estimated at 15%. Extensive opacity throughout the left long persists. The lungs are emphysematous. Cardiac silhouette is obscured. IMPRESSION: Left apical pneumothorax estimated at 15% after thoracentesis. Extensive airspace disease left lung base consistent with pneumonia. Emphysema. Electronically Signed   By: Inge Rise M.D.   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 EXAM: CT-GUIDED PLACEMENT OF LEFT PLEURAL DRAINAGE CATHETER MEDICATIONS: The patient is currently admitted  to the  hospital and receiving intravenous antibiotics. The antibiotics were administered within an appropriate time frame prior to the initiation of the procedure. ANESTHESIA/SEDATION: Fentanyl 50 mcg IV; Versed 0.5 mg IV Moderate Sedation Time:  21 minutes. The patient was continuously monitored during the procedure by the interventional radiology nurse under my direct supervision. COMPLICATIONS: None immediate. PROCEDURE: Informed written consent was obtained from the patient after a thorough discussion of the procedural risks, benefits and alternatives. All questions were addressed. Maximal Sterile Barrier Technique was utilized including caps, mask, sterile gowns, sterile gloves, sterile drape, hand hygiene and skin antiseptic. A timeout was performed prior to the initiation of the procedure. From a left lateral approach, an 18 gauge trocar needle was advanced into the left pleural space. A guidewire was advanced through the needle. The percutaneous tract was dilated and a 12 French pigtail drainage catheter advanced. Catheter positioning was confirmed by CT. The catheter was connected to a [place] Pleur-Evac device. The catheter was secured at the skin with a Prolene retention suture and StatLock device. FINDINGS: Large left pleural effusion with component of hydropneumothorax and completely drowned left lung again noted. There is mass-effect on the mediastinum which is shifted to the right. There was immediate return of amber colored and slightly turbid fluid. After placement of the pigtail drainage catheter, there was abundant return of fluid. IMPRESSION: Placement of 12 French pigtail drainage catheter into the left pleural space to treat empyema and drowned lung. Electronically Signed   By: Aletta Edouard M.D.   On: 05/08/2017 16:17   US Thoracentesis Asp Pleural Space W/img Guide  Result Date: 05/06/2017 INDICATION: Patient with stage IV lung cancer with hydropneumothorax status post thoracentesis x2.  Persistent hypoxia and recurrence of pleural fluid in free space. Request is made for diagnostic therapeutic thoracentesis. EXAM: ULTRASOUND GUIDED DIAGNOSTIC AND THERAPEUTIC THORACENTESIS MEDICATIONS: 1% lidocaine COMPLICATIONS: None immediate. PROCEDURE: An ultrasound guided thoracentesis was thoroughly discussed with the patient and questions answered. The benefits, risks, alternatives and complications were also discussed. The patient understands and wishes to proceed with the procedure. Written consent was obtained. Ultrasound was performed to localize and mark an adequate pocket of fluid in the left chest. The area was then prepped and draped in the normal sterile fashion. 1% Lidocaine was used for local anesthesia. Under ultrasound guidance a Safe-T-Centesis catheter was introduced. Thoracentesis was performed. The catheter was removed and a dressing applied. FINDINGS: A total of approximately 0.5 L of turbid yellow, brown fluid was removed. Samples were sent to the laboratory as requested by the clinical team. IMPRESSION: Successful ultrasound guided left thoracentesis yielding 0.5 L of pleural fluid. The fluid was more complex appearing. After reviewing his imaging with Dr. Annamaria Boots, it was felt that only remove about 500 cc of fluid as if he were to need a drained we still would like fluid to be present. Samples were sent to the lab determine whether he will eventually need a drain or not. Read by: Saverio Danker, PA-C Electronically Signed   By: Jerilynn Mages.  Shick M.D.   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 Request is made for repeat thoracentesis a plan for CT scan of the chest to follow the plan for possible need for drain placement. EXAM: ULTRASOUND GUIDED DIAGNOSTIC AND THERAPEUTIC THORACENTESIS COMPARISON:   Chest radiograph - 04/30/2017; 04/26/2017;  ultrasound-guided thoracentesis - 04/26/2017 MEDICATIONS: 1% lidocaine COMPLICATIONS: SIR Level A - No therapy, no consequence. Procedure complicated by development of an asymptomatic ex vacuo pneumothorax as demonstrated on prior ultrasound-guided thoracentesis performed 04/26/2017. PROCEDURE: An ultrasound guided thoracentesis was thoroughly discussed with the patient and questions answered. The benefits, risks, alternatives and complications were also discussed. The patient understands and wishes to proceed with the procedure. Written consent was obtained. Ultrasound was performed to localize and mark an adequate pocket of fluid in the left chest. The area was then prepped and draped in the normal sterile fashion. 1% Lidocaine was used for local anesthesia. Under ultrasound guidance a Safe-T-Centesis catheter was introduced. Thoracentesis was performed. The catheter was removed and a dressing applied. FINDINGS: A total of approximately 2.3 L of slightly cloudy brown, yellow fluid was removed. Samples were sent to the laboratory as requested by the clinical team. IMPRESSION: Successful ultrasound guided left thoracentesis yielding 2.3 L of pleural fluid. The patient's follow-up CT scan shows a persistent hydropneumothorax that is stable from imaging several days ago to maybe slightly increased. The patient remains asymptomatic. No intervention is required at this time as it appears his lung is trapped. We will await cultures to determine if this fluid is still infected. If it is, he will likely require a drain placement for continued drainage. If his cultures are negative then he will likely require no further intervention. Read by: Saverio Danker, PA-C Electronically Signed   By: Sandi Mariscal M.D.   On: 04/30/2017 14:00   US Thoracentesis Asp Pleural Space W/img Guide  Result Date: 04/26/2017 INDICATION: Lung cancer. Left-sided pleural effusion. Shortness of breath.  Request diagnostic and therapeutic thoracentesis. EXAM: ULTRASOUND GUIDED LEFT THORACENTESIS MEDICATIONS: None. COMPLICATIONS: None immediate. PROCEDURE: An ultrasound guided thoracentesis was thoroughly discussed with the patient and questions answered. The benefits, risks, alternatives and complications were also discussed. The patient understands and wishes to proceed with the procedure. Written consent was obtained. Ultrasound was performed to localize and mark an adequate pocket of fluid in the left chest. The area was then prepped and draped in the normal sterile fashion. 1% Lidocaine was used for local anesthesia. Under ultrasound guidance a Safe-T-Centesis catheter was introduced. Thoracentesis was performed. The catheter was removed and a dressing applied. FINDINGS: A total of approximately 1.4 L of turbid, dark yellow/brown fluid was removed. Samples were sent to the laboratory as requested by the clinical team. IMPRESSION: Successful ultrasound guided left thoracentesis yielding 1.4 L of pleural fluid. Read by: Ascencion Dike PA-C Electronically Signed   By: Aletta Edouard M.D.   On: 04/26/2017 12:24     Thoracentesis 04/26/17 and 04/30/17  Pigtail catheter placement by IR on 05/08/2017  Pleurx catheter placement on 05/19/2017 by IR  Subjective: Patient seen and examined at bedside.  He feels better.  No overnight fever, nausea or vomiting.  Discharge Exam: Vitals:   05/19/17 2145 05/20/17 0513  BP:  104/60  Pulse:  72  Resp:  18  Temp:  97.7 F (36.5 C)  SpO2: 94% 99%   Vitals:   05/19/17 1623 05/19/17 2046 05/19/17 2145 05/20/17 0513  BP: (!) 103/59 (!) 104/55  104/60  Pulse: 83 79  72  Resp:  18  18  Temp: 97.8 F (36.6 C) 97.6 F (36.4 C)  97.7 F (36.5 C)  TempSrc: Oral Oral  Oral  SpO2: 94% 96% 94% 99%  Weight:      Height:        General: Pt is  alert, awake, not in acute distress.  Looks cachectic Cardiovascular: Rate controlled, S1/S2 + Respiratory: Bilateral  decreased breath sounds at bases Abdominal: Soft, NT, ND, bowel sounds + Extremities: no edema, no cyanosis    The results of significant diagnostics from this hospitalization (including imaging, microbiology, ancillary and laboratory) are listed below for reference.     Microbiology: No results found for this or any previous visit (from the past 240 hour(s)).   Labs: BNP (last 3 results) Recent Labs    04/24/17 2227  BNP 654.6*   Basic Metabolic Panel: Recent Labs  Lab 05/14/17 0405 05/16/17 0417 05/18/17 0442 05/19/17 0455 05/20/17 0350  NA 135  --  136 135 134*  K 3.3*  --  4.0 3.7 3.5  CL 91*  --  94* 95* 95*  CO2 36*  --  34* 31 30  GLUCOSE 223*  --  177* 161* 185*  BUN 19  --  19 18 22*  CREATININE 0.49* 0.49* 0.47* 0.48* 0.50*  CALCIUM 7.7*  --  8.2* 8.0* 8.0*  MG  --   --   --  2.0 2.0   Liver Function Tests: Recent Labs  Lab 05/18/17 0442  AST 31  ALT 75*  ALKPHOS 104  BILITOT 0.4  PROT 5.5*  ALBUMIN 2.1*   No results for input(s): LIPASE, AMYLASE in the last 168 hours. No results for input(s): AMMONIA in the last 168 hours. CBC: Recent Labs  Lab 05/14/17 0405 05/18/17 0442 05/19/17 0455 05/20/17 0350  WBC 7.7 6.1 6.4 6.0  NEUTROABS  --   --  5.1 4.8  HGB 9.6* 10.1* 10.4* 10.1*  HCT 30.4* 32.2* 32.3* 31.7*  MCV 94.1 94.2 93.4 93.5  PLT 159 291 306 324   Cardiac Enzymes: No results for input(s): CKTOTAL, CKMB, CKMBINDEX, TROPONINI in the last 168 hours. BNP: Invalid input(s): POCBNP CBG: Recent Labs  Lab 05/19/17 0727 05/19/17 1225 05/19/17 1709 05/19/17 2056 05/20/17 0745  GLUCAP 170* 132* 149* 168* 149*   D-Dimer No results for input(s): DDIMER in the last 72 hours. Hgb A1c No results for input(s): HGBA1C in the last 72 hours. Lipid Profile No results for input(s): CHOL, HDL, LDLCALC, TRIG, CHOLHDL, LDLDIRECT in the last 72 hours. Thyroid function studies No results for input(s): TSH, T4TOTAL, T3FREE, THYROIDAB in the last  72 hours.  Invalid input(s): FREET3 Anemia work up No results for input(s): VITAMINB12, FOLATE, FERRITIN, TIBC, IRON, RETICCTPCT in the last 72 hours. Urinalysis    Component Value Date/Time   COLORURINE AMBER (A) 04/26/2017 0311   APPEARANCEUR HAZY (A) 04/26/2017 0311   LABSPEC >1.046 (H) 04/26/2017 0311   PHURINE 5.0 04/26/2017 0311   GLUCOSEU 50 (A) 04/26/2017 0311   HGBUR NEGATIVE 04/26/2017 0311   BILIRUBINUR NEGATIVE 04/26/2017 0311   KETONESUR 5 (A) 04/26/2017 0311   PROTEINUR 30 (A) 04/26/2017 0311   NITRITE NEGATIVE 04/26/2017 0311   LEUKOCYTESUR NEGATIVE 04/26/2017 0311   Sepsis Labs Invalid input(s): PROCALCITONIN,  WBC,  LACTICIDVEN Microbiology No results found for this or any previous visit (from the past 240 hour(s)).   Time coordinating discharge: 35 minutes  SIGNED:   Aline August, MD  [facility] 05/20/2017, 11:06 AM Pager: [phone]  If 7PM-7AM, please contact night-coverage [email or web address] Password [email or web address]

## 2017-05-20 NOTE — Clinical Social Work Placement (Signed)
Pt discharged with plan to admit to [facility] for rehab- room 311- report [phone] (had arranged to go to [facility] this morning however family decided this afternoon that they preferred [facility] instead- admissions confirmed facility could admit pt to private room today and pt and daughter agreed) [facility] transportation arranged Pt's daughter Alease Medina notified via phone All information provided to facility via the [place] See below for placement details   CLINICAL SOCIAL WORK PLACEMENT  NOTE  Date:  05/20/2017  Patient Details  Name: Maurice Tyler MRN: [id number] Date of Birth: 10/06/1940  Clinical Social Work is seeking post-discharge placement for this patient at the [place] level of care (*CSW will initial, date and re-position this form in  chart as items are completed):  Yes   Patient/family provided with Center Work Department's list of facilities offering this level of care within the geographic area requested by the patient (or if unable, by the patient's family).  Yes   Patient/family informed of their freedom to choose among providers that offer the needed level of care, that participate in Medicare, Medicaid or managed care program needed by the patient, have an available bed and are willing to accept the patient.  Yes   Patient/family informed of [employer] ownership interest in [facility] and [facility], as well as of the fact that they are under no obligation to receive care at these facilities.  PASRR submitted to EDS on 05/10/17     PASRR number received on 05/10/17     Existing PASRR number confirmed on       FL2 transmitted to all facilities in geographic area requested by pt/family on 05/10/17     FL2 transmitted to all facilities within larger geographic area on       Patient informed that his/her managed care company has contracts with or will negotiate with certain facilities, including the  following:        Yes   Patient/family informed of bed offers received.  Patient chooses bed at [facility]      Physician recommends and patient chooses bed at      Patient to be transferred to [facility] on 05/20/17.  Patient to be transferred to facility by [facility]     Patient family notified on 05/20/17 of transfer.  Name of family member notified:  Lillia Abed     PHYSICIAN       Additional Comment:    _______________________________________________ Nila Nephew, LCSW 05/20/2017, 2:28 PM

## 2017-05-20 NOTE — Progress Notes (Signed)
Nutrition Follow-up  DOCUMENTATION CODES:   Non-severe (moderate) malnutrition in context of chronic illness  INTERVENTION:    Magic cup TID with meals, each supplement provides 290 kcal and 9 grams of protein  Boost Breeze po BID, each supplement provides 250 kcal and 9 grams of protein  NUTRITION DIAGNOSIS:   Moderate Malnutrition related to chronic illness, cancer and cancer related treatments as evidenced by percent weight loss, energy intake < or equal to 50% for > or equal to 1 month, moderate fat depletion, moderate muscle depletion.  Ongoing  GOAL:   Patient will meet greater than or equal to 90% of their needs  Meeting  MONITOR:   PO intake, Supplement acceptance, Weight trends, Labs, Skin  REASON FOR ASSESSMENT:   Malnutrition Screening Tool    ASSESSMENT:   Pt with PMH significant for DM and recently diagnosed with non-small cell lung carcinoma. Pt started chemotherapy last week and has had increasing shortness of breath with productive cough since. Admitted for acute respiratory failure with sepsis from multifocal PNA and large left PE.    Pt eating 100% of meals at this time. States his appetite is strong often wanting more food when he is finished eating. Endorses drink two Boost Breeze per day and eating two Magic cups per day. Weight noted to down 11 lb since last RD visit 1/9. Pt noted to be -19800 ml fluid balance. Pt discharging to SNF today. Reminded pt about the importance of protein moving forward.   Medications reviewed and include: 20 mg lasix, SSI, 30 mEq KCl, IV abx Labs reviewed: Na 134 (L)   Diet Order:  Diet regular Room service appropriate? Yes; Fluid consistency: Thin Diet - low sodium heart healthy Diet Carb Modified  EDUCATION NEEDS:   Education needs have been addressed  Skin:  Skin Assessment: Skin Integrity Issues: Skin Integrity Issues:: Stage I Stage I: buttocks  Last BM:  05/11/17  Height:   Ht Readings from Last 1  Encounters:  04/25/17 5\' 11"  (1.803 m)    Weight:   Wt Readings from Last 1 Encounters:  05/19/17 151 lb 14.4 oz (68.9 kg)    Ideal Body Weight:  78.2 kg  BMI:  Body mass index is 21.19 kg/m.  Estimated Nutritional Needs:   Kcal:  2000-2200 kcal/day  Protein:  100-105 g/day  Fluid:  >2 L/day    Mariana Single RD, LDN Clinical Nutrition Pager # - [phone]

## 2017-05-20 NOTE — Progress Notes (Signed)
Representative contacted from [place]) [phone]. Report given, questions, concerns denied. No changes from am assessment. pleuryx cathter remains intact dressing CDI.

## 2017-05-21 ENCOUNTER — Non-Acute Institutional Stay (SKILLED_NURSING_FACILITY): Payer: Medicare Other | Admitting: Internal Medicine

## 2017-05-21 ENCOUNTER — Emergency Department (HOSPITAL_COMMUNITY): Payer: Medicare Other

## 2017-05-21 ENCOUNTER — Emergency Department (HOSPITAL_COMMUNITY)
Admission: EM | Admit: 2017-05-21 | Discharge: 2017-05-21 | Disposition: A | Discharge status: Home / Self Care | Payer: Medicare Other | Attending: Emergency Medicine | Admitting: Emergency Medicine

## 2017-05-21 ENCOUNTER — Encounter: Payer: Self-pay | Admitting: Internal Medicine

## 2017-05-21 ENCOUNTER — Ambulatory Visit: Payer: Medicare Other

## 2017-05-21 ENCOUNTER — Other Ambulatory Visit: Payer: Self-pay

## 2017-05-21 DIAGNOSIS — Z79899 Other long term (current) drug therapy: Secondary | ICD-10-CM | POA: Diagnosis not present

## 2017-05-21 DIAGNOSIS — Z87891 Personal history of nicotine dependence: Secondary | ICD-10-CM | POA: Diagnosis not present

## 2017-05-21 DIAGNOSIS — E119 Type 2 diabetes mellitus without complications: Secondary | ICD-10-CM | POA: Insufficient documentation

## 2017-05-21 DIAGNOSIS — R0902 Hypoxemia: Secondary | ICD-10-CM | POA: Diagnosis not present

## 2017-05-21 DIAGNOSIS — I4891 Unspecified atrial fibrillation: Secondary | ICD-10-CM | POA: Diagnosis not present

## 2017-05-21 DIAGNOSIS — L89899 Pressure ulcer of other site, unspecified stage: Secondary | ICD-10-CM

## 2017-05-21 DIAGNOSIS — Z85118 Personal history of other malignant neoplasm of bronchus and lung: Secondary | ICD-10-CM | POA: Insufficient documentation

## 2017-05-21 DIAGNOSIS — Z7984 Long term (current) use of oral hypoglycemic drugs: Secondary | ICD-10-CM | POA: Insufficient documentation

## 2017-05-21 DIAGNOSIS — C3492 Malignant neoplasm of unspecified part of left bronchus or lung: Secondary | ICD-10-CM

## 2017-05-21 DIAGNOSIS — J9621 Acute and chronic respiratory failure with hypoxia: Secondary | ICD-10-CM

## 2017-05-21 DIAGNOSIS — R0602 Shortness of breath: Secondary | ICD-10-CM | POA: Diagnosis present

## 2017-05-21 DIAGNOSIS — J869 Pyothorax without fistula: Secondary | ICD-10-CM

## 2017-05-21 NOTE — Assessment & Plan Note (Signed)
05/21/17 no significant change on chest x-ray Improvement with increase in nasal oxygen flow from 2-4 L/m Nebulizer treatments as needed for acute rest or stress; may consider low-dose maintenance steroids. Her fullness can be avoided due to his diabetes

## 2017-05-21 NOTE — ED Notes (Signed)
Bed: [facility]37 Expected date:  Expected time:  Means of arrival:  Comments: 77 yo SOB

## 2017-05-21 NOTE — Assessment & Plan Note (Signed)
As per Dr. [name] Palliative care consulted

## 2017-05-21 NOTE — Patient Instructions (Signed)
See assessment and plan under each diagnosis in the problem list and acutely for this visit 

## 2017-05-21 NOTE — ED Notes (Signed)
[facility] called for transport.  

## 2017-05-21 NOTE — Assessment & Plan Note (Signed)
Rate control is goal if A. fib recurs  Continue amiodarone and metoprolol Not candidate for anticoagulation due to comorbidities

## 2017-05-21 NOTE — Assessment & Plan Note (Signed)
Monitor fasting blood glucoses Monday, Wednesday, Friday Initiate Lantus if fasting glucoses over 180

## 2017-05-21 NOTE — Progress Notes (Signed)
NURSING HOME LOCATION:  [facility] ROOM NUMBER:  [id number]  CODE STATUS:  Full Code  PCP:  [name], MD  [place] [id number]    This is a comprehensive admission note to [facility] performed on this date less than 30 days from date of admission. Included are preadmission medical/surgical history;reconciled medication list; family history; social history and comprehensive review of systems.  Corrections and additions to the records were documented . Comprehensive physical exam was also performed. Additionally a clinical summary was entered for each active diagnosis pertinent to this admission in the Problem List to enhance continuity of care.  HPI: Patient was hospitalized 04/24/17-05/20/17 for multifocal streptococcal pneumonia and large left hydropneumothorax.  Admission chest x-ray revealed a large left perihilar soft tissue mass consistent with known metastatic stage IV lung cancer for which he sees Dr. Earlie Server.  On 04/26/17 1.4 L of fluid was removed. 2.3 L was removed on 12/27 and 0.5 L removed on 05/06/17. Empyema was diagnosed based on peural fluid culture growth of strep viridans . ID recommended Rocephin as inpatient with the transition to Augmentin as outpatient for 10 additional days. Pigtail catheter was placed 05/08/17 with high output from chest tube. Initially pulmonary/critical care consultant did not want to place a Pleurx catheter because of fear of residual infection. After discussion with cardiothoracic surgery Pleurx catheter was placed by Dr Maryclare Bean, Interventional Radiology 1/15.  Hospital course was complicated by atrial fibrillation with rapid ventricular response. CHA2DS2-VASc score 2. He did receive amiodarone and metoprolol as per cardiology. Cardiology recommended against anticoagulation for the new atrial fib due to significant comorbidities. Edema was attributed to hypoproteinemia and third spacing. Diabetes was felt to be stable.  Glucoses ranged from 161 up to 185. Skin pressure injuries were present in the left armpit ( stage I) and sacrally ( stage II). Wound care was consulted.  Urinary retention necessitated Foley placement. He failed voiding trial on 05/13/17 as  bladder scan revealed 900 mL of urine residual. Foley was continued at discharge and follow-up with urology recommended as OP. Renal ultrasound on 05/09/17 had revealed no hydronephrosis or obstructive uropathy. At discharge sodium was 134, glucose 185, BUN 22, creatinine 0.5, calcium 8, hemoglobin 10.1 and hematocrit 31.7. Indices were normochromic, normocytic. He was discharged to [facility] 1/16. He return to the ED 1/17 for dyspnea. He awoke short of breath. Pleural fluid was to be drained should he be dyspneic. Staff was unable to perform the drainage procedure and EMS was called. O2 sats on room were 85-88 percent in transport. In the ED Chest x-ray suggested improvement in the left lung changes. There was no obvious fluid collection. Attempt to drain via the Pleurx catheter by the ED physician was unsuccessful. The patient improved with oxygen and was transported back to this facility. No further chemotherapy or treatment recommended for the metastatic stage IV lung cancer, palliative care actively participating. The patient is a DO NOT RESUSCITATE but he hopes to regain enough strength to resume therapy for the cancer.  Past medical and surgical history: include "moderate" protein malnutrition. Video bronchoscopy was performed in 11/18.  Social history:Nondrinker. The patient smoked for 58 years before quitting in July 2018. He smoked a pack a day until he retired and then increased it slightly according to his wife.  Family history: Updated as to negatives   ROS:He denies any acute cardiopulmonary symptoms this morning prior to the shortness of breath. He states that EMS did describes some  wheezing.Wife states he snores, no apnea reported.Despite the current dire  situation he denies depression or anxiety.  Constitutional: No fever,significant weight change  Eyes: No redness, discharge, pain, vision change ENT/mouth: No nasal congestion,  purulent discharge, earache,change in hearing ,sore throat  Cardiovascular: No chest pain, palpitations,paroxysmal nocturnal dyspnea, claudication, edema  Respiratory: No cough, sputum production,hemoptysis Gastrointestinal: No heartburn,dysphagia,abdominal pain, nausea / vomiting,rectal bleeding, melena,change in bowels Genitourinary: No dysuria,hematuria, pyuria,  incontinence, nocturia Musculoskeletal: No joint stiffness, joint swelling, weakness,pain Dermatologic: No rash, pruritus, change in appearance of skin Neurologic: No dizziness,headache,syncope, seizures, numbness , tingling Endocrine: No change in hair/skin/ nails, excessive thirst, excessive hunger, excessive urination  Hematologic/lymphatic: No significant bruising, lymphadenopathy,abnormal bleeding Allergy/immunology: No itchy/ watery eyes, significant sneezing, urticaria, angioedema  Physical exam:  Pertinent or positive findings: He appears markedly wasted and pale. Cheeks are sunken and there is wasting of temporal muscles. Intermittently he has exotropia of the left eye. Slight gallop cadence suggested. On O2 nasally.Breath sounds are decreased. Pleurx is present over the left chest. 1/2+ edema is present at the sock line. Dorsalis pedis pulses are decreased. He has slight clubbing of the nailbeds. Non pitting edema LUE. There is subtle weakness generally. Foley catheter is present.Faintly hyperpigmented leaf shaped lesion R lower shin which blanches with pressure.  General appearance: no acute distress , increased work of breathing is present.   Lymphatic: No lymphadenopathy about the head, neck, axilla . Eyes: No conjunctival inflammation or lid edema is present. There is no scleral icterus. Ears:  External ear exam shows no significant lesions or  deformities.   Nose:  External nasal examination shows no deformity or inflammation. Nasal mucosa are pink and moist without lesions ,exudates Oral exam: lips and gums are healthy appearing.There is no oropharyngeal erythema or exudate . Neck:  No thyromegaly, masses, tenderness noted.    Heart:  No murmur, click, rub .  Lungs:without significant wheezes, rhonchi,rales , rubs. Abdomen:Bowel sounds are normal. Abdomen is soft and nontender with no organomegaly, hernias,masses. GU: deferred  Extremities:  No cyanosis  Neurologic exam : Balance,Rhomberg,finger to nose testing could not be completed due to clinical state Skin: Warm & dry w/o tenting. No significant lesions or rash.  See clinical summary under each active problem in the Problem List with associated updated therapeutic plan

## 2017-05-21 NOTE — ED Provider Notes (Signed)
[facility] DEPT Provider Note   CSN: [id number] Arrival date & time: 05/21/17  0745     History   Chief Complaint Chief Complaint  Patient presents with  . Shortness of Breath    HPI Maurice Tyler is a 77 y.o. male.  HPI Patient presents with dyspnea. Patient has notable history of lung cancer, had a Pleurx catheter placed 2 days ago, with thoracentesis. He is a resident in a nursing facility. He was instructed that when he feels dyspnea he should have his pleural fluid drained. However, today after complaining of dyspnea to staff, they were unable to perform the procedure. With his persistent dyspnea, but no new chest pain, fever, cough, he was sent here for evaluation. Patient notes that he was doing reasonably well between his procedure 2 days ago until this morning when he awoke. He is finished with his current phase of radiation therapy, is currently engaged in physical therapy, preparing for next oncologic session. EMS reports the patient was hypoxic, with room air saturation in the 85/88% range during transport. Patient does not wear home oxygen.  Past Medical History:  Diagnosis Date  . Diabetes mellitus without complication ([place])   . Malignant neoplasm of unspecified part of unspecified bronchus or lung ([place]) 03/30/2017    Patient Active Problem List   Diagnosis Date Noted  . Acute on chronic respiratory failure with hypoxia ([place])   . Chest tube in place   . Empyema ([place])   . Malignant neoplasm of left lung ([place])   . Streptococcal pneumonia ([place])   . New onset a-fib ([place]) 04/30/2017  . AKI (acute kidney injury) ([place]) 04/30/2017  . Non-small cell cancer of left lung ([place])   . Goals of care, counseling/discussion   . Palliative care encounter   . Empyema lung () 04/29/2017  . Malnutrition of moderate degree 04/28/2017  . Pressure injury of skin 04/28/2017  . Diabetes mellitus type 2 in nonobese ([place]) 04/25/2017  .  Acute respiratory failure with hypoxia and hypercapnia ([place]) 04/25/2017  . Severe sepsis ([place]) 04/25/2017  . Multifocal pneumonia 04/25/2017  . Malignant neoplasm of bronchus of left upper lobe ([place]) 03/30/2017  . 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 IR GUIDED DRAIN W CATHETER PLACEMENT  05/19/2017  . IR THORACENTESIS ASP PLEURAL SPACE W/IMG GUIDE  03/16/2017  . VIDEO BRONCHOSCOPY Bilateral 03/25/2017   Procedure: VIDEO BRONCHOSCOPY WITH FLUORO;  Surgeon: Juanito Doom, MD;  Location: [facility];  Service: Cardiopulmonary;  Laterality: Bilateral;       Home Medications    Prior to Admission medications   Medication Sig Start Date End Date Taking? Authorizing Provider  ACCU-CHEK FASTCLIX LANCETS [place]  03/19/17   [provider]  ACCU-CHEK GUIDE test strip USE TO CHECK BLOOD SUGAR BID 03/23/17   [provider]  acetaminophen (TYLENOL) 500 MG tablet Take 1,000 mg by mouth every 6 (six) hours as needed for moderate pain.    [provider]  albuterol (PROVENTIL HFA;VENTOLIN HFA) 108 (90 Base) MCG/ACT inhaler Inhale 1 puff every 6 (six) hours as needed into the lungs for wheezing or shortness of breath.    [provider]  amiodarone (PACERONE) 400 MG tablet Take 1 tablet (400 mg total) by mouth 2 (two) times daily. 05/20/17   Aline August, MD  amoxicillin-clavulanate (AUGMENTIN) 875-125 MG tablet Take 1 tablet by mouth every 12 (twelve) hours for 10  days. 05/20/17 05/30/17  Aline August, MD  cetirizine (ZYRTEC) 10 MG tablet Take 10 mg daily by mouth. ALLERTEC    [provider]  furosemide (LASIX) 20 MG tablet Take 1 tablet (20 mg total) by mouth daily. 05/20/17   Aline August, MD  loperamide (IMODIUM A-D) 2 MG tablet Take 2 mg by mouth 3 (three) times daily as needed for diarrhea or loose stools.    [provider]  LORazepam (ATIVAN) 0.5 MG  tablet Take 1 tablet (0.5 mg total) by mouth every 6 (six) hours as needed for anxiety. 05/20/17   Aline August, MD  metFORMIN (GLUCOPHAGE) 500 MG tablet Take 500 mg 2 (two) times daily with a meal by mouth.     [provider]  metoprolol tartrate (LOPRESSOR) 25 MG tablet Take 1 tablet (25 mg total) by mouth 2 (two) times daily. 05/20/17   Aline August, MD  potassium chloride 20 MEQ/15ML (10%) SOLN Take 22.5 mLs (30 mEq total) by mouth daily. 05/20/17   Aline August, MD  prochlorperazine (COMPAZINE) 10 MG tablet Take 1 tablet (10 mg total) by mouth every 6 (six) hours as needed for nausea or vomiting. 04/24/17   Kyung Rudd, MD    Family History Family History  Problem Relation Age of Onset  . Cancer Mother     Social History Social History   Tobacco Use  . Smoking status: Former Smoker    Years: 58.00    Types: Cigarettes    Last attempt to quit: 11/21/2016    Years since quitting: 0.4  . Smokeless tobacco: Never Used  Substance Use Topics  . Alcohol use: No    Frequency: Never  . Drug use: No     Allergies   Patient has no known allergies.   Review of Systems Review of Systems  Constitutional:       Per HPI, otherwise negative  HENT:       Per HPI, otherwise negative  Respiratory:       Per HPI, otherwise negative  Cardiovascular:       Per HPI, otherwise negative  Gastrointestinal: Negative for vomiting.  Endocrine:       Negative aside from HPI  Genitourinary:       Neg aside from HPI   Musculoskeletal:       Per HPI, otherwise negative  Skin: Negative.   Allergic/Immunologic: Positive for immunocompromised state.  Neurological: Negative for syncope.     Physical Exam Updated Vital Signs BP (!) 111/49   Pulse 85   Temp (!) 97.4 F (36.3 C) (Oral)   Resp 18   Ht 5\' 11"  (1.803 m)   Wt 68.5 kg (151 lb)   SpO2 92%   BMI 21.06 kg/m   Physical Exam  Constitutional: He is oriented to person, place, and time. He has a sickly appearance.  No distress.  HENT:  Head: Normocephalic and atraumatic.  Eyes: Conjunctivae and EOM are normal.  Cardiovascular: Normal rate and regular rhythm.  Pulmonary/Chest: No stridor. He has decreased breath sounds.    Abdominal: He exhibits no distension.  Musculoskeletal: He exhibits no edema.  Neurological: He is alert and oriented to person, place, and time.  Skin: Skin is warm and dry.  Psychiatric: He has a normal mood and affect.  Nursing note and vitals reviewed.    ED Treatments / Results   Radiology Dg Chest 2 View  Result Date: 05/21/2017 CLINICAL DATA:  77 year old male with stage IV lung cancer, left pleural effusion,  left chest tube. Left hydropneumothorax. EXAM: CHEST  2 VIEW COMPARISON:  Pleural catheter exchange 05/19/2017, portable chest 05/13/2017 and earlier FINDINGS: Semi upright AP and lateral views of the chest. The pigtail left pleural catheter seen on 05/13/2017 has been exchanged for a left chest tube, but left pneumothorax persists. On the lateral view the hydropneumothorax appears to be partially loculated and might have separate anterior and posterior components. The chest tube is located in the posterior pleural space (arrow). Overall, left lung ventilation has not improved since 05/13/2017. Stable cardiac size and mediastinal contours. The right lung appears to be clear allowing for portable technique. Visualized tracheal air column is within normal limits. No acute osseous abnormality identified. IMPRESSION: 1. Continued poor left lung re-expansion despite pleural catheter exchange for chest tube on 05/19/2017. Relatively large left hydropneumothorax which appears at least partially loculated. 2. No new cardiopulmonary abnormality. Electronically Signed   By: Genevie Ann M.D.   On: 05/21/2017 09:14   Ir Guided Niel Hummer W Catheter Placement  Result Date: 05/19/2017 INDICATION: Stage IV lung cancer.  Left pleural effusion. EXAM: LEFT PLEURX MEDICATIONS: The patient is  currently admitted to the hospital and receiving intravenous antibiotics. The antibiotics were administered within an appropriate time frame prior to the initiation of the procedure. ANESTHESIA/SEDATION: Fentanyl 100 mcg IV; Versed 2 mg IV Moderate Sedation Time:  17 The patient was continuously monitored during the procedure by the interventional radiology nurse under my direct supervision. COMPLICATIONS: None immediate. PROCEDURE: Informed written consent was obtained from the patient after a thorough discussion of the procedural risks, benefits and alternatives. All questions were addressed. Maximal Sterile Barrier Technique was utilized including caps, mask, sterile gowns, sterile gloves, sterile drape, hand hygiene and skin antiseptic. A timeout was performed prior to the initiation of the procedure. The left chest was prepped and draped in a sterile fashion. 1% lidocaine was utilized for local anesthesia. The existing chest tube was exchanged over an Amplatz wire for the peel-away sheath. A second incision was made 10 cm anterior to the chest tube insertion site. A PleurX drain was then tunneled from the second incision out the chest tube insertion site. It was then fed through the peel-away sheath. The peel-away sheath was removed. The cuff was positioned in the subcutaneous tract. The initial puncture site incision was closed with a for 3 0 Vicryl subcutaneous stitch and 4 0 Vicryl subcuticular stitch. The second incision was closed with an 0 Prolene pursestring knot. FINDINGS: Images demonstrate placement of a left PleurX drain. IMPRESSION: Successful exchange of the left chest tube for a PleurX drain. Electronically Signed   By: Marybelle Killings M.D.   On: 05/19/2017 16:27    Procedures Procedures (including critical care time) With supplemental oxygen the patient has saturation of 95%, via 3 L nasal cannula.  Initial Impression / Assessment and Plan / ED Course  I have reviewed the triage vital signs  and the nursing notes.  Pertinent labs & imaging results that were available during my care of the patient were reviewed by me and considered in my medical decision making (see chart for details).     11:26 AM  I discussed patient's case with his pulmonology team from his recent hospitalization. We reviewed the x-ray, compared to his recent CAT scan. It appears as though markings have actually improved, and there is no obvious fluid collection. After attempt at drainage via the Pleurx catheter, with no production of fluid, low suspicion for quick reaccumulation of pleural fluid. With  his improvement on oxygen, to appropriate saturation, and without other complaints, patient will return to his nursing facility. Absent his other complaints, there is low suspicion for acute new pathology, and he is already receiving therapy for pneumonia, he is also following up with his oncology team.  Patient affirms that though he is DNR he hopes to regain enough strength to resume therapy.  Final Clinical Impressions(s) / ED Diagnoses  Hypoxia   Carmin Muskrat, MD 05/21/17 1128

## 2017-05-21 NOTE — ED Notes (Signed)
Pleurx catheter in place on L chest. Cap opened to drain with nothing coming out. Dr. Vanita Panda aware and will follow up.

## 2017-05-21 NOTE — Discharge Instructions (Signed)
Please be sure to monitor your condition carefully, and follow-up with your oncology team.

## 2017-05-21 NOTE — ED Triage Notes (Signed)
Pt c/o SOB for 2 hours, significant wheezing on EMS arrival but clearing up with breathing treatment. Recently hospitalized 1.5 days ago- had a L side chest tube for fluid build up. Supposed to be draining from the site but the nursing home hasn't been. Only been back in nursing home for a day and a half. Has a foley catheter. Left leg amputated. 0.5 albuterol given. 0.5 atrovent. 125 solumedrol. 18 g R FA. 120/74 BP Hr 88 RR 24 88% on RA, does not wear o2 regularly. 100% with neb treatment. BS =179. Pt at baseline per nursing home.

## 2017-05-21 NOTE — Assessment & Plan Note (Addendum)
Complete Augmentin as ordered; emphasize Augmentin after meal only Add probiotic prophylactically

## 2017-05-21 NOTE — ED Notes (Signed)
Patient transported to X-ray 

## 2017-05-21 NOTE — Assessment & Plan Note (Signed)
Wound care at [facility]

## 2017-05-22 ENCOUNTER — Ambulatory Visit: Payer: Medicare Other

## 2017-05-22 LAB — HEMOGLOBIN A1C: Hemoglobin A1C: 7.4

## 2017-05-25 ENCOUNTER — Ambulatory Visit: Payer: Medicare Other

## 2017-05-26 ENCOUNTER — Ambulatory Visit: Payer: Medicare Other

## 2017-05-26 LAB — FUNGUS CULTURE WITH STAIN

## 2017-05-26 LAB — FUNGAL ORGANISM REFLEX

## 2017-05-26 LAB — FUNGUS CULTURE RESULT

## 2017-05-27 ENCOUNTER — Ambulatory Visit: Payer: TRICARE For Life (TFL)

## 2017-05-27 ENCOUNTER — Ambulatory Visit: Payer: TRICARE For Life (TFL) | Admitting: Internal Medicine

## 2017-05-27 ENCOUNTER — Ambulatory Visit: Payer: Medicare Other

## 2017-05-27 ENCOUNTER — Other Ambulatory Visit: Payer: TRICARE For Life (TFL)

## 2017-05-28 ENCOUNTER — Ambulatory Visit: Payer: Medicare Other

## 2017-05-29 ENCOUNTER — Ambulatory Visit: Payer: Medicare Other

## 2017-05-29 ENCOUNTER — Ambulatory Visit: Payer: TRICARE For Life (TFL)

## 2017-06-03 ENCOUNTER — Other Ambulatory Visit: Payer: TRICARE For Life (TFL)

## 2017-06-03 ENCOUNTER — Non-Acute Institutional Stay (SKILLED_NURSING_FACILITY): Payer: Medicare Other | Admitting: Adult Health

## 2017-06-03 ENCOUNTER — Encounter: Payer: Self-pay | Admitting: Adult Health

## 2017-06-03 DIAGNOSIS — I952 Hypotension due to drugs: Secondary | ICD-10-CM | POA: Diagnosis not present

## 2017-06-03 DIAGNOSIS — I4891 Unspecified atrial fibrillation: Secondary | ICD-10-CM | POA: Diagnosis not present

## 2017-06-03 DIAGNOSIS — C3492 Malignant neoplasm of unspecified part of left bronchus or lung: Secondary | ICD-10-CM

## 2017-06-03 NOTE — Progress Notes (Signed)
Location:  [facility] Room Number: 834-H Place of Service:  SNF (31) Provider:  Durenda Age, NP  Patient Care Team: [name], MD as PCP - General (Internal Medicine) Minus Breeding, MD as PCP - Cardiology (Cardiology)  Extended Emergency Contact Information Primary Emergency Contact: Edmunds,Corinne Address: [address]          [place], [place] [address] Johnnette Litter of [place] Phone: [phone] Relation: Spouse Secondary Emergency Contact: Prudy Feeler Mobile Phone: [phone] Relation: Daughter Preferred language: English Interpreter needed? No  Code Status:  Full Code  Goals of care: Advanced Directive information Advanced Directives 05/21/2017  Does Patient Have a Medical Advance Directive? Yes  Type of Advance Directive Out of facility DNR (pink MOST or yellow form)  Would patient like information on creating a medical advance directive? -     Chief Complaint  Patient presents with  . Acute Visit    Patient has documented low blood pressure    HPI:  Pt is a 77 y.o. male seen today for an acute visit secondary to low blood pressures.  He is a short-term rehabilitation resident at [facility]. He was seen in his room today. BP review showed low BPs - 95/60, 100/61, 115/70, 87/60.   He has been admitted to [facility] on 05/22/17 from the ER. He was sent there due to staff's inability to perform pleural fluid drain. Of note, he was having a short-term rehabilitation @ [facility] before being sent to ER. 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 He underwent Pleurx catheter placement by interventional radiology on 05/19/17. He was on Rocephin in the hospital and was discharged  on Augmentin. He developed atrial fibrillation with rapid ventricular rate and was recommended by cardiology to be on Amiodarone and Metoprolol. He will follow-up with oncology, cardiology and pulmonary and interventional radiology if needed. He has a PMH of DM without complication, malignant neoplasm of lung, AKI, and empyema of lung.     Past Medical History:  Diagnosis Date  . Diabetes mellitus without complication ([place])   . Malignant neoplasm of unspecified part of unspecified bronchus or lung ([place]) 03/30/2017   Past Surgical History:  Procedure Laterality Date  . IR GUIDED DRAIN W CATHETER PLACEMENT  05/19/2017  . IR THORACENTESIS ASP PLEURAL SPACE W/IMG GUIDE  03/16/2017  . VIDEO BRONCHOSCOPY Bilateral 03/25/2017   Procedure: VIDEO BRONCHOSCOPY WITH FLUORO;  Surgeon: Juanito Doom, MD;  Location: [facility];  Service: Cardiopulmonary;  Laterality: Bilateral;    No Known Allergies  Outpatient Encounter Medications as of 06/03/2017  Medication Sig  . acetaminophen (TYLENOL) 500 MG tablet Take 1,000 mg by mouth every 6 (six) hours as needed for moderate pain.  Marland Kitchen albuterol (PROVENTIL HFA;VENTOLIN HFA) 108 (90 Base) MCG/ACT inhaler Inhale 1 puff every 6 (six) hours as needed into the lungs for wheezing or shortness of breath.  Marland Kitchen amiodarone (PACERONE) 400 MG tablet Take 1 tablet (400 mg total) by mouth 2 (two) times daily.  . cetirizine (ZYRTEC) 10 MG tablet Take 10 mg daily by mouth. ALLERTEC  . furosemide (LASIX) 20 MG tablet Take 1 tablet (20 mg total) by mouth daily.  Marland Kitchen loperamide (IMODIUM A-D) 2 MG tablet Take 2 mg by mouth 3 (three) times daily as needed for diarrhea or loose stools.  Marland Kitchen  LORazepam (ATIVAN) 0.5 MG tablet Take 1 tablet (0.5 mg total) by mouth every 6 (six) hours as needed for anxiety.  . metFORMIN (GLUCOPHAGE) 500 MG tablet Take 500 mg 2 (two) times daily with a meal by mouth.   . metoprolol tartrate (LOPRESSOR) 25 MG tablet Take 1 tablet (25 mg total) by mouth 2  (two) times daily.  . potassium chloride 20 MEQ/15ML (10%) SOLN Take 22.5 mLs (30 mEq total) by mouth daily.  . prochlorperazine (COMPAZINE) 10 MG tablet Take 1 tablet (10 mg total) by mouth every 6 (six) hours as needed for nausea or vomiting.  . saccharomyces boulardii (FLORASTOR) 250 MG capsule Take 250 mg by mouth 2 (two) times daily.  Marland Kitchen ACCU-CHEK FASTCLIX LANCETS MISC   . ACCU-CHEK GUIDE test strip USE TO CHECK BLOOD SUGAR BID   No facility-administered encounter medications on file as of 06/03/2017.     Review of Systems  GENERAL: No change in appetite, no fatigue, no weight changes, no fever, chills or weakness MOUTH and THROAT: Denies oral discomfort, gingival pain or bleeding RESPIRATORY: no cough, SOB, DOE, wheezing, hemoptysis CARDIAC: No chest pain, edema or palpitations GI: No abdominal pain, diarrhea, constipation, heart burn, nausea or vomiting PSYCHIATRIC: Denies feelings of depression or anxiety. No report of hallucinations, insomnia, paranoia, or agitation   Immunization History  Administered Date(s) Administered  . Influenza Split 02/08/2016, 02/10/2017  . Influenza,inj,Quad PF,6+ Mos 02/28/2014   Pertinent  Health Maintenance Due  Topic Date Due  . FOOT EXAM  08/05/1950  . OPHTHALMOLOGY EXAM  08/05/1950  . URINE MICROALBUMIN  08/05/1950  . PNA vac Low Risk Adult (1 of 2 - PCV13) 08/05/2004  . HEMOGLOBIN A1C  11/19/2017  . INFLUENZA VACCINE  Completed      Vitals:   06/03/17 0950  BP: 95/60  Pulse: 65  Resp: 18  Temp: (!) 96.3 F (35.7 C)  TempSrc: Oral  SpO2: 99%  Weight: 151 lb (68.5 kg)  Height: 5\' 11"  (1.803 m)   Body mass index is 21.06 kg/m.  Physical Exam  GENERAL APPEARANCE:  In no acute distress. Normal body habitus MOUTH and THROAT: Lips are without lesions. Oral mucosa is moist and without lesions. Tongue is normal in shape, size, and color and without lesions RESPIRATORY: Breathing is even & unlabored, on continuous O2 @ 4L/min via  [place], has left lower chest Pleurx tube, right chest with slight wheeze CARDIAC: Irregular heart rate, no murmur,no extra heart sounds, no edema GI: Abdomen soft, normal BS, no masses, no tenderness EXTREMITIES:  Able to move X 4 extremities PSYCHIATRIC: Alert and oriented X 3. Affect and behavior are appropriate  Labs reviewed: Recent Labs    05/06/17 0316 05/07/17 0411 05/08/17 0422  05/11/17 0451  05/18/17 0442 05/19/17 0455 05/20/17 0350  NA 137  --  139   < > 138   < > 136 135 134*  K 4.1  --  3.6   < > 2.6*   < > 4.0 3.7 3.5  CL 102  --  98*   < > 91*   < > 94* 95* 95*  CO2 28  --  35*   < > 39*   < > 34* 31 30  GLUCOSE 178*  --  139*   < > 220*   < > 177* 161* 185*  BUN 19  --  18   < > 16   < > 19 18 22*  CREATININE 0.38*  --  0.36*   < >  0.39*   < > 0.47* 0.48* 0.50*  CALCIUM 7.8*  --  8.1*   < > 7.8*   < > 8.2* 8.0* 8.0*  MG 1.8 1.8 1.8   < > 1.9  --   --  2.0 2.0  PHOS 2.6 3.0 2.7  --   --   --   --   --   --    < > = values in this interval not displayed.   Recent Labs    05/06/17 0316 05/09/17 0730 05/10/17 0458 05/18/17 0442  AST 16 20  --  31  ALT 32 21  --  75*  ALKPHOS 184* 94  --  104  BILITOT 0.6 0.6  --  0.4  PROT 4.8* 4.2*  --  5.5*  ALBUMIN 1.7* 1.6* 1.8* 2.1*   Recent Labs    05/10/17 0458  05/18/17 0442 05/19/17 0455 05/20/17 0350  WBC 6.7   < > 6.1 6.4 6.0  NEUTROABS 5.8  --   --  5.1 4.8  HGB 11.0*   < > 10.1* 10.4* 10.1*  HCT 36.2*   < > 32.2* 32.3* 31.7*  MCV 94.5   < > 94.2 93.4 93.5  PLT 154   < > 291 306 324   < > = values in this interval not displayed.   Lab Results  Component Value Date   TSH 1.707 04/15/2017   Lab Results  Component Value Date   HGBA1C 7.4 05/22/2017   Significant Diagnostic Results in last 30 days:  Dg Chest 1 View  Result Date: 05/10/2017 CLINICAL DATA:  77 year old male with left-sided chest tube EXAM: CHEST 1 VIEW COMPARISON:  Prior chest x-ray obtained yesterday; CT scan of the chest 05/06/2017  FINDINGS: Stable position of left basilar percutaneous thoracostomy tube. Persistent large left-sided loculated pneumothorax. Perhaps minimal improvement in the basilar component. The entire left lung remains atelectatic. Stable moderate layering right-sided pleural effusion with associated right basilar atelectasis. Unchanged cardiac mediastinal contours. Atherosclerotic calcifications in the transverse aorta. Background pulmonary emphysema. No acute osseous abnormality. IMPRESSION: 1. Stable chest x-ray with left basilar chest tube in place. Minimal change in the large left-sided loculated pneumothorax which is likely ex vacuo in nature and due to failure of expansion of the atelectatic left lung. 2. Stable moderate layering right pleural effusion. Electronically Signed   By: Jacqulynn Cadet M.D.   On: 05/10/2017 12:33   Dg Chest 1 View  Result Date: 05/06/2017 CLINICAL DATA:  Status post left thoracentesis EXAM: CHEST 1 VIEW COMPARISON:  05/04/2017 FINDINGS: Stable hydropneumothorax is noted on the left. Considerable consolidated lung is again identified and stable from previous exams. The right lung is clear. Cardiac shadow is stable. No bony abnormality is seen. IMPRESSION: Stable left hydropneumothorax. Electronically Signed   By: Inez Catalina M.D.   On: 05/06/2017 15:25   Dg Chest 2 View  Result Date: 05/21/2017 CLINICAL DATA:  77 year old male with stage IV lung cancer, left pleural effusion, left chest tube. Left hydropneumothorax. EXAM: CHEST  2 VIEW COMPARISON:  Pleural catheter exchange 05/19/2017, portable chest 05/13/2017 and earlier FINDINGS: Semi upright AP and lateral views of the chest. The pigtail left pleural catheter seen on 05/13/2017 has been exchanged for a left chest tube, but left pneumothorax persists. On the lateral view the hydropneumothorax appears to be partially loculated and might have separate anterior and posterior components. The chest tube is located in the posterior  pleural space (arrow). Overall, left lung ventilation has not improved  since 05/13/2017. Stable cardiac size and mediastinal contours. The right lung appears to be clear allowing for portable technique. Visualized tracheal air column is within normal limits. No acute osseous abnormality identified. IMPRESSION: 1. Continued poor left lung re-expansion despite pleural catheter exchange for chest tube on 05/19/2017. Relatively large left hydropneumothorax which appears at least partially loculated. 2. No new cardiopulmonary abnormality. Electronically Signed   By: Genevie Ann M.D.   On: 05/21/2017 09:14   Ct Chest Wo Contrast  Result Date: 05/06/2017 CLINICAL DATA:  LEFT empyema, stage IV LEFT lung cancer diabetes mellitus EXAM: CT CHEST WITHOUT CONTRAST TECHNIQUE: Multidetector CT imaging of the chest was performed following the standard protocol without IV contrast. Sagittal and coronal MPR images reconstructed from axial data set. COMPARISON:  04/30/2017 ; chest radiographs 05/06/2017, 05/04/2017 and earlier FINDINGS: Cardiovascular: Atherosclerotic calcifications aorta and proximal great vessels. Small pericardial effusion. Aorta normal caliber. Mediastinum/Nodes: Base of cervical region unremarkable. Proximal thoracic esophagus normal appearance with esophagus contiguous with tumor from LEFT lung with which invades the mediastinum. No esophageal dilatation. No definite thoracic adenopathy, though LEFT hilar assessment is severely limited. Lungs/Pleura: Complete collapse of the LEFT lung since prior exam with again identified large LEFT hydropneumothorax. Associated depression of the LEFT diaphragm. Abnormal soft tissue at the medial RIGHT lung extending from upper lobe into mediastinum, abutting the esophagus, LEFT pulmonary artery, and descending thoracic aorta. Abrupt cut off of the LEFT mainstem bronchus by tumor. Small to moderate RIGHT pleural effusion with compressive atelectasis of the RIGHT lower lobe.  Underlying emphysematous changes. Scattered respiratory motion artifacts. Central peribronchial thickening. No definite pulmonary mass/nodule or acute infiltrate. Upper Abdomen: Dependent gallstones in gallbladder. Mild collecting system dilatation at upper pole RIGHT kidney. Atrophic pancreas. Remaining visualized upper abdomen unremarkable. Musculoskeletal: No acute osseous findings IMPRESSION: Large LEFT hydropneumothorax with depression of LEFT diaphragm as noted on prior chest radiographs. Probable large tumor mass at the central LEFT lung invading the mediastinum and abutting the esophagus, descending thoracic aorta, and LEFT pulmonary artery, with associated with abrupt cut off of the LEFT mainstem bronchus and complete collapse of the LEFT lung progressive since prior exam. Small pericardial effusion. Increased RIGHT pleural effusion and compressive atelectasis of the posterior RIGHT lung since prior exam. Question RIGHT hydronephrosis. Cholelithiasis. Aortic Atherosclerosis (ICD10-I70.0). Emphysema (ICD10-J43.9). Electronically Signed   By: Lavonia Dana M.D.   On: 05/06/2017 22:01   Ir Guided Niel Hummer W Catheter Placement  Result Date: 05/19/2017 INDICATION: Stage IV lung cancer.  Left pleural effusion. EXAM: LEFT PLEURX MEDICATIONS: The patient is currently admitted to the hospital and receiving intravenous antibiotics. The antibiotics were administered within an appropriate time frame prior to the initiation of the procedure. ANESTHESIA/SEDATION: Fentanyl 100 mcg IV; Versed 2 mg IV Moderate Sedation Time:  17 The patient was continuously monitored during the procedure by the interventional radiology nurse under my direct supervision. COMPLICATIONS: None immediate. PROCEDURE: Informed written consent was obtained from the patient after a thorough discussion of the procedural risks, benefits and alternatives. All questions were addressed. Maximal Sterile Barrier Technique was utilized including caps, mask,  sterile gowns, sterile gloves, sterile drape, hand hygiene and skin antiseptic. A timeout was performed prior to the initiation of the procedure. The left chest was prepped and draped in a sterile fashion. 1% lidocaine was utilized for local anesthesia. The existing chest tube was exchanged over an Amplatz wire for the peel-away sheath. A second incision was made 10 cm anterior to the chest tube insertion site. A PleurX  drain was then tunneled from the second incision out the chest tube insertion site. It was then fed through the peel-away sheath. The peel-away sheath was removed. The cuff was positioned in the subcutaneous tract. The initial puncture site incision was closed with a for 3 0 Vicryl subcutaneous stitch and 4 0 Vicryl subcuticular stitch. The second incision was closed with an 0 Prolene pursestring knot. FINDINGS: Images demonstrate placement of a left PleurX drain. IMPRESSION: Successful exchange of the left chest tube for a PleurX drain. Electronically Signed   By: Marybelle Killings M.D.   On: 05/19/2017 16:27   US Renal  Result Date: 05/09/2017 CLINICAL DATA:  77 year old male with urinary retention EXAM: RENAL / URINARY TRACT ULTRASOUND COMPLETE COMPARISON:  None. FINDINGS: Right Kidney: Length: 13.7 cm. Echogenicity is within normal limits. Circumscribed anechoic cystic lesions are present in the lower pole measuring up to 2.5 and 0.9 cm respectively. Findings are consistent with simple cysts. No evidence of hydronephrosis or nephrolithiasis. Left Kidney: Length: 13.2 cm. Echogenicity is within normal limits. Circumscribed anechoic simple cyst in the lower pole measures 1.5 cm. Bladder: Foley catheter in the collapsed bladder. Other: Multiple mobile echogenic foci with posterior acoustic shadowing in the gallbladder consistent with cholelithiasis. IMPRESSION: 1. No evidence of hydronephrosis. 2. Bilateral simple renal cysts. 3. Cholelithiasis. Electronically Signed   By: Jacqulynn Cadet M.D.   On:  05/09/2017 15:07   Dg Chest Port 1 View  Result Date: 05/13/2017 CLINICAL DATA:  Pleural effusion.  Chest tube. EXAM: PORTABLE CHEST 1 VIEW COMPARISON:  Multiple priors. FINDINGS: Pigtail catheter LEFT hemithorax and associated LEFT pneumothorax appear similar to yesterday's radiograph. There is poor reinflation of the LEFT lung. Suspected RIGHT lower chest wall skin fold, although a tiny lateral pneumothorax cannot be excluded. No concerning extrapleural air over the apex. Unchanged cardiomediastinal silhouette. IMPRESSION: Poor re-expansion of the LEFT lung. Overall stable LEFT hydropneumothorax. Skin fold versus tiny RIGHT lateral pneumothorax, continued surveillance warranted. Electronically Signed   By: Staci Righter M.D.   On: 05/13/2017 07:08   Dg Chest Port 1 View  Result Date: 05/11/2017 CLINICAL DATA:  Follow-up chest tube EXAM: PORTABLE CHEST 1 VIEW COMPARISON:  05/10/2017 FINDINGS: Cardiac shadow is enlarged but stable. Small bore chest tube is again seen on the left. Large left hydropneumothorax is again noted and stable. No significant aeration of the left lung is seen. Right lung is well aerated with evidence of small right pleural effusion. This is stable from the prior exam. No new focal abnormality is seen. IMPRESSION: Overall stable appearance of the chest when compared with the prior study. Electronically Signed   By: Inez Catalina M.D.   On: 05/11/2017 07:31   Dg Chest Port 1 View  Result Date: 05/09/2017 CLINICAL DATA:  Left pneumothorax EXAM: PORTABLE CHEST 1 VIEW COMPARISON:  Chest x-ray 05/08/2017 CT chest 05/06/2017 FINDINGS: Persistent large left hydropneumothorax with partial loculated fluid in the mid portion. Left basilar chest tube in satisfactory position. Diffuse mild right lung interstitial thickening. Small right pleural effusion. No right pneumothorax. Stable cardiomediastinal silhouette. No acute osseous abnormality. IMPRESSION: Stable large left hydropneumothorax with a  partially loculated component. Left basilar chest tube in satisfactory unchanged position. 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 On chemotherapy. Now presents with shortness of breath and productive cough.  EXAM: PORTABLE CHEST 1 VIEW COMPARISON:  CT chest 05/06/2017.  Chest 05/06/2017. FINDINGS: Large left hydropneumothorax is again demonstrated. There is probable collapse of most to the remaining left lung. Changes could represent malignant effusion or empyema. Since the previous study, there is interval placement of a left chest tube. No significant improvement of aeration of the left lung. Mild cardiac enlargement. Small right pleural effusion. No consolidation on the right. Calcification of the aorta. IMPRESSION: Persistent large left hydropneumothorax with left chest tube in place. Electronically Signed   By: Lucienne Capers M.D.   On: 05/08/2017 18:45   Dg Chest Port 1 View  Result Date: 05/04/2017 CLINICAL DATA:  Patient with history of loculated pleural effusion. EXAM: PORTABLE CHEST 1 VIEW COMPARISON:  Chest radiograph 05/01/2017. FINDINGS: Monitoring leads overlie the patient. Stable cardiomegaly. Re- demonstrated loculated left hydropneumothorax. Residual left lung is largely obscured. Right basilar opacities favored to represent atelectasis. Thoracic spine degenerative changes. IMPRESSION: Re- demonstrated large left hydropneumothorax. Residual left parenchymal tissue is largely obscured. Electronically Signed   By: Lovey Newcomer M.D.   On: 05/04/2017 11:44   Ct Image Guided Drainage By Percutaneous Catheter  Result Date: 05/08/2017 INDICATION: History of squamous carcinoma of the left lung with drowned left lung, hydropneumothorax and evidence of empyema by analysis of previous fluid withdrawn from the left pleural space. EXAM:  CT-GUIDED PLACEMENT OF LEFT PLEURAL DRAINAGE CATHETER MEDICATIONS: The patient is currently admitted to the hospital and receiving intravenous antibiotics. The antibiotics were administered within an appropriate time frame prior to the initiation of the procedure. ANESTHESIA/SEDATION: Fentanyl 50 mcg IV; Versed 0.5 mg IV Moderate Sedation Time:  21 minutes. The patient was continuously monitored during the procedure by the interventional radiology nurse under my direct supervision. COMPLICATIONS: None immediate. PROCEDURE: Informed written consent was obtained from the patient after a thorough discussion of the procedural risks, benefits and alternatives. All questions were addressed. Maximal Sterile Barrier Technique was utilized including caps, mask, sterile gowns, sterile gloves, sterile drape, hand hygiene and skin antiseptic. A timeout was performed prior to the initiation of the procedure. From a left lateral approach, an 18 gauge trocar needle was advanced into the left pleural space. A guidewire was advanced through the needle. The percutaneous tract was dilated and a 12 French pigtail drainage catheter advanced. Catheter positioning was confirmed by CT. The catheter was connected to a [place] Pleur-Evac device. The catheter was secured at the skin with a Prolene retention suture and StatLock device. FINDINGS: Large left pleural effusion with component of hydropneumothorax and completely drowned left lung again noted. There is mass-effect on the mediastinum which is shifted to the right. There was immediate return of amber colored and slightly turbid fluid. After placement of the pigtail drainage catheter, there was abundant return of fluid. IMPRESSION: Placement of 12 French pigtail drainage catheter into the left pleural space to treat empyema and drowned lung. Electronically Signed   By: Aletta Edouard M.D.   On: 05/08/2017 16:17   US Thoracentesis Asp Pleural Space W/img Guide  Result Date:  05/06/2017 INDICATION: Patient with stage IV lung cancer with hydropneumothorax status post thoracentesis x2. Persistent hypoxia and recurrence of pleural fluid in free space. Request is made for diagnostic therapeutic thoracentesis. EXAM: ULTRASOUND GUIDED DIAGNOSTIC AND THERAPEUTIC THORACENTESIS MEDICATIONS: 1% lidocaine COMPLICATIONS: None immediate. PROCEDURE: An ultrasound guided thoracentesis was thoroughly discussed with the patient and questions answered. The benefits, risks, alternatives and complications were also discussed. The patient understands and wishes to proceed with the  procedure. Written consent was obtained. Ultrasound was performed to localize and mark an adequate pocket of fluid in the left chest. The area was then prepped and draped in the normal sterile fashion. 1% Lidocaine was used for local anesthesia. Under ultrasound guidance a Safe-T-Centesis catheter was introduced. Thoracentesis was performed. The catheter was removed and a dressing applied. FINDINGS: A total of approximately 0.5 L of turbid yellow, brown fluid was removed. Samples were sent to the laboratory as requested by the clinical team. IMPRESSION: Successful ultrasound guided left thoracentesis yielding 0.5 L of pleural fluid. The fluid was more complex appearing. After reviewing his imaging with Dr. Annamaria Boots, it was felt that only remove about 500 cc of fluid as if he were to need a drained we still would like fluid to be present. Samples were sent to the lab determine whether he will eventually need a drain or not. Read by: Saverio Danker, PA-C Electronically Signed   By: Jerilynn Mages.  Shick M.D.   On: 05/06/2017 16:13    Assessment/Plan  1. Hypotension due to drugs - will decrease Metoprolol tartrate from 25 mg BID to 1/2 tab = 12.5 mg BID and to hold for SBP <110 and HR <60, BP/HR BID X 1 week, cardiology consult   2. New onset a-fib ([place]) -  will decrease Metoprolol tartrate from 25 mg BID to 1/2 tab = 12.5 mg BID, continue  Amiodarone 400 mg 1 tab PO BID, follow-up with cardiology    3. Stage IV squamous cell carcinoma of left lung (H[facility]) - follows-up with Dr. [name], per patient report, no further chemotherapy or treatment recommended, palliative consult with [facility], follow-up with Dr. Simonne Maffucci, Pulmonary disease     Family/ staff Communication:  Discussed plan of care with patient and charge nurse.  Labs/tests ordered:   None  Goals of care:   Short-term rehabilitation   Durenda Age, NP [facility] [phone] (Monday-Friday 8:00 a.m. - 5:00 p.m.) [phone] (after hours)

## 2017-06-10 ENCOUNTER — Inpatient Hospital Stay: Payer: Medicare Other | Attending: Internal Medicine

## 2017-06-11 ENCOUNTER — Ambulatory Visit: Payer: Medicare Other | Admitting: Cardiology

## 2017-06-12 ENCOUNTER — Ambulatory Visit: Payer: Medicare Other | Admitting: Adult Health

## 2017-06-17 ENCOUNTER — Ambulatory Visit: Payer: TRICARE For Life (TFL) | Admitting: Internal Medicine

## 2017-06-17 ENCOUNTER — Other Ambulatory Visit: Payer: TRICARE For Life (TFL)

## 2017-06-17 ENCOUNTER — Encounter: Payer: Medicare Other | Admitting: Nutrition

## 2017-06-17 ENCOUNTER — Ambulatory Visit: Payer: TRICARE For Life (TFL)

## 2017-06-19 ENCOUNTER — Ambulatory Visit: Payer: TRICARE For Life (TFL)

## 2017-06-19 LAB — ACID FAST CULTURE WITH REFLEXED SENSITIVITIES: ACID FAST CULTURE - AFSCU3: NEGATIVE

## 2017-06-23 ENCOUNTER — Inpatient Hospital Stay
Admit: 2017-06-23 | Discharge: 2017-06-23 | Disposition: A | Discharge status: Home / Self Care | Payer: Self-pay | Attending: Radiation Oncology | Admitting: Radiation Oncology

## 2017-06-24 ENCOUNTER — Ambulatory Visit
Admission: RE | Admit: 2017-06-24 | Discharge: 2017-06-24 | Disposition: A | Discharge status: Home / Self Care | Payer: Medicare Other | Source: Ambulatory Visit | Attending: Radiation Oncology | Admitting: Radiation Oncology

## 2017-06-24 ENCOUNTER — Telehealth: Payer: Self-pay | Admitting: Internal Medicine

## 2017-06-24 ENCOUNTER — Encounter: Payer: Self-pay | Admitting: Radiation Oncology

## 2017-06-24 ENCOUNTER — Other Ambulatory Visit: Payer: Self-pay

## 2017-06-24 ENCOUNTER — Other Ambulatory Visit: Payer: Medicare Other

## 2017-06-24 VITALS — BP 105/58 | HR 81 | Temp 97.7°F | Resp 18 | Ht 71.0 in | Wt 137.0 lb

## 2017-06-24 DIAGNOSIS — Z923 Personal history of irradiation: Secondary | ICD-10-CM | POA: Diagnosis not present

## 2017-06-24 DIAGNOSIS — C3412 Malignant neoplasm of upper lobe, left bronchus or lung: Secondary | ICD-10-CM | POA: Diagnosis present

## 2017-06-24 DIAGNOSIS — C3492 Malignant neoplasm of unspecified part of left bronchus or lung: Secondary | ICD-10-CM

## 2017-06-24 NOTE — Progress Notes (Signed)
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  He tolerated radiotherapy well without difficulty. He has undergone pleurex catheter placement on 05/19/17 and will follow up with Dr. [name] on 07/07/17.                               On review of systems, the patient states he's doing well recovering at his rehab facility and hopes to get home soon. He denies any difficulty with breathing, chest pain, or shortness of breath currently. He notes improvement in breathing though every other day after draining his pleural fluid. No other complaints are noted.   ALLERGIES:  has No Known Allergies.  Meds: Current Outpatient Medications  Medication Sig Dispense Refill  . ACCU-CHEK FASTCLIX LANCETS MISC     . ACCU-CHEK GUIDE test strip USE TO CHECK BLOOD SUGAR BID  3  . amiodarone (PACERONE) 400 MG tablet Take 1 tablet (400 mg total) by mouth 2 (two) times daily. 60 tablet 0  . cetirizine (ZYRTEC) 10 MG tablet Take 10 mg daily by mouth. ALLERTEC    . furosemide (LASIX) 20 MG tablet Take 1 tablet (20 mg total) by mouth daily. 30 tablet 0  . metFORMIN (GLUCOPHAGE) 500 MG tablet Take 500 mg 2 (two) times daily with a meal by mouth.     . metoprolol tartrate (LOPRESSOR) 25 MG tablet Take 1 tablet (25 mg total) by mouth 2 (two) times daily. 60 tablet 0  . potassium chloride 20 MEQ/15ML (10%) SOLN Take 22.5 mLs (30 mEq total) by mouth daily. 450 mL 0  . acetaminophen (TYLENOL) 500 MG tablet Take 1,000 mg by mouth every 6 (six) hours as needed for moderate  pain.    Marland Kitchen albuterol (PROVENTIL HFA;VENTOLIN HFA) 108 (90 Base) MCG/ACT inhaler Inhale 1 puff every 6 (six) hours as needed into the lungs for wheezing or shortness of breath.    . loperamide (IMODIUM A-D) 2 MG tablet Take 2 mg by mouth 3 (three) times daily as needed for diarrhea or loose stools.    Marland Kitchen LORazepam (ATIVAN) 0.5 MG tablet Take 1 tablet (0.5 mg total) by mouth every 6 (six) hours as needed for anxiety. (Patient not taking: Reported on 06/24/2017) 14 tablet 0  . prochlorperazine (COMPAZINE) 10 MG tablet Take 1 tablet (10 mg total) by mouth every 6 (six) hours as needed for nausea or vomiting. (Patient not taking: Reported on 06/24/2017) 30 tablet 0   No current facility-administered medications for this encounter.     Physical Findings:  height is 5\' 11"  (1.803 m) and weight is 137 lb (62.1 kg). His oral temperature is 97.7 F (36.5 C). His blood pressure is 105/58 (abnormal) and his pulse is 81. His respiration is 18 and oxygen saturation is 98%.  Pain Assessment Pain Score: 0-No pain/10 In general this is a chronically ill appearing caucasian male in no acute distress. He's alert and oriented x4 and appropriate throughout the examination. Cardiopulmonary assessment is negative for acute  distress and he exhibits normal effort. Chest is clear to auscultation in the left upper field, and decreased sounds are noted of the mid and low fields of the left lung, and the right lung is clear throughout. His pleurex catheter site is intact as well   Lab Findings: Lab Results  Component Value Date   WBC 6.0 05/20/2017   HGB 10.1 (L) 05/20/2017   HCT 31.7 (L) 05/20/2017   MCV 93.5 05/20/2017   PLT 324 05/20/2017     Radiographic Findings: No results found.  Impression/Plan: 1. 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 The patient has recovered well from the effects of radiotherapy. He continues to have issues with his effusion and drains  about 2 L every other day from his pleurex. He will follow up with Dr. [name] on 07/07/17 to further discuss treatment recommendations for his disease.         Carola Rhine, PAC

## 2017-06-24 NOTE — Telephone Encounter (Signed)
Scheduled appt per 2/19 sch message - contacted [facility] and they are aware of appt date and time.

## 2017-06-30 ENCOUNTER — Telehealth: Payer: Self-pay | Admitting: Medical Oncology

## 2017-06-30 NOTE — Telephone Encounter (Signed)
Returned call to wife I gave her next appointment. She asked what is next plan of care and I told her that will be discussed at his next appt.

## 2017-07-01 ENCOUNTER — Other Ambulatory Visit: Payer: Medicare Other

## 2017-07-02 ENCOUNTER — Non-Acute Institutional Stay (SKILLED_NURSING_FACILITY): Payer: Medicare Other | Admitting: Adult Health

## 2017-07-02 ENCOUNTER — Encounter: Payer: Self-pay | Admitting: Adult Health

## 2017-07-02 DIAGNOSIS — L89150 Pressure ulcer of sacral region, unstageable: Secondary | ICD-10-CM | POA: Diagnosis not present

## 2017-07-02 DIAGNOSIS — E119 Type 2 diabetes mellitus without complications: Secondary | ICD-10-CM | POA: Diagnosis not present

## 2017-07-02 DIAGNOSIS — J9611 Chronic respiratory failure with hypoxia: Secondary | ICD-10-CM | POA: Diagnosis not present

## 2017-07-02 DIAGNOSIS — C3492 Malignant neoplasm of unspecified part of left bronchus or lung: Secondary | ICD-10-CM

## 2017-07-02 DIAGNOSIS — I4891 Unspecified atrial fibrillation: Secondary | ICD-10-CM | POA: Diagnosis not present

## 2017-07-02 NOTE — Progress Notes (Signed)
Location:  [facility] Room Number: 301 Place of Service:  SNF (31) Provider:  Durenda Age, NP  Patient Care Team: [name], MD as PCP - General (Internal Medicine) Minus Breeding, MD as PCP - Cardiology (Cardiology)  Extended Emergency Contact Information Primary Emergency Contact: Chamorro,Corinne Address: [address]          [place],  [address] [place] Phone: [phone] Relation: Spouse Secondary Emergency Contact: Prudy Feeler Mobile Phone: [phone] Relation: Daughter Preferred language: English Interpreter needed? No  Code Status:  Full Code  Goals of care: Advanced Directive information Advanced Directives 06/24/2017  Does Patient Have a Medical Advance Directive? No  Type of Advance Directive -  Would patient like information on creating a medical advance directive? -     Chief Complaint  Patient presents with  . Medical Management of Chronic Issues    Routine [facility] visit    HPI:  Pt is a 77 y.o. male seen today for medical management of chronic diseases.  He is a short-term rehabilitation resident at [facility].  He has a PMH of DM without complication, malignant neoplasm of the lung, AKI, and empyema of the lung. He was seen in his room today. He reported that he is able to walk now from his room to therapy room. He had weight loss of 3 lbs from last month. Uses 3L O2 continuously.    Past Medical History:  Diagnosis Date  . Diabetes mellitus without complication ([place])   . Malignant neoplasm of unspecified part of unspecified bronchus or lung ([place]) 03/30/2017   Past Surgical History:  Procedure Laterality Date  . IR GUIDED DRAIN W CATHETER PLACEMENT  05/19/2017  . IR THORACENTESIS ASP PLEURAL SPACE W/IMG GUIDE  03/16/2017  . VIDEO BRONCHOSCOPY Bilateral 03/25/2017   Procedure: VIDEO BRONCHOSCOPY WITH FLUORO;  Surgeon: Juanito Doom, MD;  Location:  [facility];  Service: Cardiopulmonary;  Laterality: Bilateral;    No Known Allergies  Outpatient Encounter Medications as of 07/02/2017  Medication Sig  . acetaminophen (TYLENOL) 500 MG tablet Take 1,000 mg by mouth every 6 (six) hours as needed for moderate pain.  Marland Kitchen albuterol (PROVENTIL HFA;VENTOLIN HFA) 108 (90 Base) MCG/ACT inhaler Inhale 1 puff every 6 (six) hours as needed into the lungs for wheezing or shortness of breath.  Marland Kitchen amiodarone (PACERONE) 400 MG tablet Take 1 tablet (400 mg total) by mouth 2 (two) times daily.  . cetirizine (ZYRTEC) 10 MG tablet Take 10 mg daily by mouth. ALLERTEC  . furosemide (LASIX) 20 MG tablet Take 1 tablet (20 mg total) by mouth daily.  Marland Kitchen loperamide (IMODIUM A-D) 2 MG tablet Take 2 mg by mouth 3 (three) times daily as needed for diarrhea or loose stools.  Marland Kitchen LORazepam (ATIVAN) 0.5 MG tablet Take 1 tablet (0.5 mg total) by mouth every 6 (six) hours as needed for anxiety.  . metFORMIN (GLUCOPHAGE) 500 MG tablet Take 500 mg 2 (two) times daily with a meal by mouth.   . NUTRITIONAL SUPPLEMENT LIQD Take 120 mLs by mouth daily. MedPass  . potassium chloride 20 MEQ/15ML (10%) SOLN Take 22.5 mLs (30 mEq total) by mouth daily.  . prochlorperazine (COMPAZINE) 10 MG tablet Take 1 tablet (10 mg total) by mouth every 6 (six) hours as needed for nausea or vomiting.  . tamsulosin (FLOMAX) 0.4 MG CAPS capsule Take 0.4 mg by mouth daily.  Marland Kitchen ACCU-CHEK FASTCLIX LANCETS MISC   . ACCU-CHEK GUIDE test strip USE  TO CHECK BLOOD SUGAR BID  . [DISCONTINUED] metoprolol tartrate (LOPRESSOR) 25 MG tablet Take 1 tablet (25 mg total) by mouth 2 (two) times daily.   No facility-administered encounter medications on file as of 07/02/2017.     Review of Systems  GENERAL: No fever, chills or weakness MOUTH and THROAT: Denies oral discomfort, gingival pain  RESPIRATORY: no cough, SOB, DOE, wheezing, hemoptysis CARDIAC: No chest pain, edema or palpitations GI: No abdominal pain,  diarrhea, constipation, heart burn, nausea or vomiting PSYCHIATRIC: Denies feelings of depression or anxiety. No report of hallucinations, insomnia, paranoia, or agitation   Immunization History  Administered Date(s) Administered  . Influenza Split 02/08/2016, 02/10/2017  . Influenza,inj,Quad PF,6+ Mos 02/28/2014   Pertinent  Health Maintenance Due  Topic Date Due  . FOOT EXAM  [birth year]05/1950  . OPHTHALMOLOGY EXAM  [birth year]/05/1950  . URINE MICROALBUMIN  [birth year]/05/1949  . PNA vac Low Risk Adult (1 of 2 - PCV13) 08/05/2004  . HEMOGLOBIN A1C  11/19/2017  . INFLUENZA VACCINE  Completed   Fall Risk  06/24/2017  Falls in the past year? No      Vitals:   07/02/17 1530  BP: 103/62  Pulse: 85  Resp: 18  Temp: (!) 97 F (36.1 C)  TempSrc: Oral  SpO2: 96%  Weight: 136 lb 6.4 oz (61.9 kg)  Height: 5\' 11"  (1.803 m)   Body mass index is 19.02 kg/m.  Physical Exam  GENERAL APPEARANCE:  In no acute distress. SKIN:  Sacral pressure sore stage unstageable, 2 X 2.5 cm pale red, 50% slough, small amount of serous drainage MOUTH and THROAT: Lips are without lesions. Oral mucosa is moist and without lesions. Tongue is normal in shape, size, and color and without lesions RESPIRATORY: Breathing is even & unlabored, BS CTAB CARDIAC: RRR, no murmur,no extra heart sounds, no edema, left lower chest with Pleurx tubing intact GI: Abdomen soft, normal BS, no masses, no tenderness EXTREMITIES:  Able to move X 4 extremities PSYCHIATRIC: Alert and oriented X 3. Affect and behavior are appropriate   Labs reviewed: Recent Labs    05/06/17 0316 05/07/17 0411 05/08/17 0422  05/11/17 0451  05/18/17 0442 05/19/17 0455 05/20/17 0350  NA 137  --  139   < > 138   < > 136 135 134*  K 4.1  --  3.6   < > 2.6*   < > 4.0 3.7 3.5  CL 102  --  98*   < > 91*   < > 94* 95* 95*  CO2 28  --  35*   < > 39*   < > 34* 31 30  GLUCOSE 178*  --  139*   < > 220*   < > 177* 161* 185*  BUN 19  --  18   < > 16   < > 19 18 22*    CREATININE 0.38*  --  0.36*   < > 0.39*   < > 0.47* 0.48* 0.50*  CALCIUM 7.8*  --  8.1*   < > 7.8*   < > 8.2* 8.0* 8.0*  MG 1.8 1.8 1.8   < > 1.9  --   --  2.0 2.0  PHOS 2.6 3.0 2.7  --   --   --   --   --   --    < > = values in this interval not displayed.   Recent Labs    05/06/17 0316 05/09/17 0730 05/10/17 0458 05/18/17 0442  AST 16 20  --  31  ALT 32 21  --  75*  ALKPHOS 184* 94  --  104  BILITOT 0.6 0.6  --  0.4  PROT 4.8* 4.2*  --  5.5*  ALBUMIN 1.7* 1.6* 1.8* 2.1*   Recent Labs    05/10/17 0458  05/18/17 0442 05/19/17 0455 05/20/17 0350  WBC 6.7   < > 6.1 6.4 6.0  NEUTROABS 5.8  --   --  5.1 4.8  HGB 11.0*   < > 10.1* 10.4* 10.1*  HCT 36.2*   < > 32.2* 32.3* 31.7*  MCV 94.5   < > 94.2 93.4 93.5  PLT 154   < > 291 306 324   < > = values in this interval not displayed.   Lab Results  Component Value Date   TSH 1.707 04/15/2017   Lab Results  Component Value Date   HGBA1C 7.4 05/22/2017    Assessment/Plan  1. Stage IV squamous cell carcinoma of left lung (HCC) - follow-up with Dr. [name], Pleurx drainage is attempted Q other day, patient reported that 1L of drainage was obtained today   2. New onset a-fib (HCC) -  Rate-controlled, Metoprolol was recently discontinued due to hypotension, continue Amiodarone 400 mg 1 tab BID   3. Chronic respiratory failure with hypoxia (HCC) - continue O2, Proventil PRN   4. Diabetes mellitus type 2 in nonobese (HCC) - continue Metformin 500 mg BID, CBG Q M_W_F Lab Results  Component Value Date   HGBA1C 7.4 05/22/2017     5. Pressure ulcer of sacral region, unstageable ([place]) - continue Santyl treatment daily by treatment nurse, air bed mattress, keep skin clean and dry       Family/ staff Communication: Discussed plan of care with resident and charge nurse.   Labs/tests ordered:  CBC and BMP  Goals of care:   Short-term care   Durenda Age, NP [facility] [phone] (Monday-Friday 8:00 a.m. - 5:00 p.m.) [phone] (after hours)

## 2017-07-03 LAB — CBC AND DIFFERENTIAL
HEMATOCRIT: 29 — AB (ref 41–53)
Hemoglobin: 9.5 — AB (ref 13.5–17.5)
NEUTROS ABS: 7
Platelets: 279 (ref 150–399)
WBC: 8.6

## 2017-07-03 LAB — BASIC METABOLIC PANEL
BUN: 19 (ref 4–21)
Creatinine: 0.5 — AB (ref 0.6–1.3)
Glucose: 138
POTASSIUM: 4.6 (ref 3.4–5.3)
SODIUM: 138 (ref 137–147)

## 2017-07-07 ENCOUNTER — Inpatient Hospital Stay: Payer: No Typology Code available for payment source | Attending: Internal Medicine | Admitting: Oncology

## 2017-07-07 ENCOUNTER — Inpatient Hospital Stay: Payer: No Typology Code available for payment source

## 2017-07-07 ENCOUNTER — Encounter: Payer: Self-pay | Admitting: Oncology

## 2017-07-07 VITALS — BP 100/50 | HR 85 | Temp 97.6°F | Resp 16 | Ht 71.0 in

## 2017-07-07 DIAGNOSIS — C3412 Malignant neoplasm of upper lobe, left bronchus or lung: Secondary | ICD-10-CM | POA: Diagnosis present

## 2017-07-07 DIAGNOSIS — Z79899 Other long term (current) drug therapy: Secondary | ICD-10-CM | POA: Diagnosis not present

## 2017-07-07 DIAGNOSIS — R5383 Other fatigue: Secondary | ICD-10-CM

## 2017-07-07 DIAGNOSIS — R5382 Chronic fatigue, unspecified: Secondary | ICD-10-CM

## 2017-07-07 DIAGNOSIS — E440 Moderate protein-calorie malnutrition: Secondary | ICD-10-CM | POA: Diagnosis not present

## 2017-07-07 DIAGNOSIS — Z5112 Encounter for antineoplastic immunotherapy: Secondary | ICD-10-CM

## 2017-07-07 DIAGNOSIS — C3492 Malignant neoplasm of unspecified part of left bronchus or lung: Secondary | ICD-10-CM

## 2017-07-07 DIAGNOSIS — Z7189 Other specified counseling: Secondary | ICD-10-CM

## 2017-07-07 LAB — CBC WITH DIFFERENTIAL/PLATELET
Basophils Absolute: 0.1 10*3/uL (ref 0.0–0.1)
Basophils Relative: 1 %
EOS PCT: 0 %
Eosinophils Absolute: 0 10*3/uL (ref 0.0–0.5)
HEMATOCRIT: 33.8 % — AB (ref 38.4–49.9)
Hemoglobin: 10.7 g/dL — ABNORMAL LOW (ref 13.0–17.1)
LYMPHS ABS: 0.5 10*3/uL — AB (ref 0.9–3.3)
LYMPHS PCT: 7 %
MCH: 27.5 pg (ref 27.2–33.4)
MCHC: 31.5 g/dL — ABNORMAL LOW (ref 32.0–36.0)
MCV: 87.3 fL (ref 79.3–98.0)
MONO ABS: 0.6 10*3/uL (ref 0.1–0.9)
Monocytes Relative: 7 %
NEUTROS ABS: 6.7 10*3/uL — AB (ref 1.5–6.5)
Neutrophils Relative %: 85 %
PLATELETS: 287 10*3/uL (ref 140–400)
RBC: 3.87 MIL/uL — ABNORMAL LOW (ref 4.20–5.82)
RDW: 16.6 % — AB (ref 11.0–14.6)
WBC: 7.9 10*3/uL (ref 4.0–10.3)

## 2017-07-07 LAB — COMPREHENSIVE METABOLIC PANEL
ALBUMIN: 2.4 g/dL — AB (ref 3.5–5.0)
ALT: 59 U/L — ABNORMAL HIGH (ref 0–55)
AST: 10 U/L (ref 5–34)
Alkaline Phosphatase: 289 U/L — ABNORMAL HIGH (ref 40–150)
Anion gap: 11 (ref 3–11)
BUN: 18 mg/dL (ref 7–26)
CHLORIDE: 101 mmol/L (ref 98–109)
CO2: 27 mmol/L (ref 22–29)
Calcium: 9.6 mg/dL (ref 8.4–10.4)
Creatinine, Ser: 0.62 mg/dL — ABNORMAL LOW (ref 0.70–1.30)
GFR calc Af Amer: >60 mL/min (ref >60)
GFR calc non Af Amer: >60 mL/min (ref >60)
GLUCOSE: 115 mg/dL (ref 70–140)
POTASSIUM: 4.8 mmol/L (ref 3.5–5.1)
SODIUM: 139 mmol/L (ref 136–145)
Total Bilirubin: 0.6 mg/dL (ref 0.2–1.2)
Total Protein: 6.1 g/dL — ABNORMAL LOW (ref 6.4–8.3)

## 2017-07-07 NOTE — Progress Notes (Signed)
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  PRIOR THERAPY: None  CURRENT THERAPY: Palliative systemic chemotherapy with carboplatin for AUC of 5, paclitaxel 175 mg/M2 and Keytruda 200 mg IV every 3 weeks.  First dose 04/15/2017.  The patient had poor tolerance of his first cycle of chemotherapy and required hospitalization for pneumonia.  Beginning with cycle 2, he will receive Keytruda only.  INTERVAL HISTORY: Ukiah Trawick 77 y.o. male returns for routine follow-up visit accompanied by his wife.  Patient had a lengthy hospitalization due to multifocal pneumonia.  He experienced acute respiratory failure with hypoxia and sepsis.  The patient was discharged to a skilled nursing facility for rehabilitation.  He continues to reside at a skilled nursing facility and is working actively with therapy to increase his stamina.  He reports that he is getting stronger.  He denies fevers and chills.  Denies chest pain, cough, hemoptysis.  He has his baseline shortness of breath and continues to wear home oxygen.  Denies nausea, vomiting, constipation, diarrhea.  The patient still has a Foley catheter in place due to difficulty voiding.  He has a Pleurx catheter in place and is draining approximately 200 cc every other day.  He reports weight loss.  He did not weigh today but reports his recent weight at the skilled facility is being 136 pounds.  This is approximately a 20 pound weight loss since he was last weighed in our office on 04/15/2017.  The patient is here for evaluation and discussion of his treatment options.  MEDICAL HISTORY: Past Medical History:  Diagnosis Date  .  Diabetes mellitus without complication ([place])   . Malignant neoplasm of unspecified part of unspecified bronchus or lung ([place]) 03/30/2017    ALLERGIES:  has No Known Allergies.  MEDICATIONS:  Current Outpatient Medications  Medication Sig Dispense Refill  . ACCU-CHEK FASTCLIX LANCETS MISC     . ACCU-CHEK GUIDE test strip USE TO CHECK BLOOD SUGAR BID  3  . amiodarone (PACERONE) 400 MG tablet Take 1 tablet (400 mg total) by mouth 2 (two) times daily. 60 tablet 0  . cetirizine (ZYRTEC) 10 MG tablet Take 10 mg daily by mouth. ALLERTEC    . furosemide (LASIX) 20 MG tablet Take 1 tablet (20 mg total) by mouth daily. 30 tablet 0  . loperamide (IMODIUM A-D) 2 MG tablet Take 2 mg by mouth 3 (three) times daily as needed for diarrhea or loose stools.    . metFORMIN (GLUCOPHAGE) 500 MG tablet Take 500 mg 2 (two) times daily with a meal by mouth.     . NUTRITIONAL SUPPLEMENT LIQD Take 120 mLs by mouth daily. MedPass    . potassium chloride 20 MEQ/15ML (10%) SOLN Take 22.5 mLs (30 mEq total) by mouth daily. 450 mL 0  . tamsulosin (FLOMAX) 0.4 MG CAPS capsule Take 0.4 mg by mouth daily.    Marland Kitchen acetaminophen (TYLENOL) 500 MG tablet Take 1,000 mg by mouth every 6 (six) hours as needed for moderate pain.    Marland Kitchen albuterol (PROVENTIL HFA;VENTOLIN HFA) 108 (90 Base) MCG/ACT inhaler Inhale 1 puff every 6 (six) hours as needed into the lungs for wheezing or shortness of breath.    Marland Kitchen LORazepam (ATIVAN) 0.5  MG tablet Take 1 tablet (0.5 mg total) by mouth every 6 (six) hours as needed for anxiety. (Patient not taking: Reported on 07/07/2017) 14 tablet 0  . prochlorperazine (COMPAZINE) 10 MG tablet Take 1 tablet (10 mg total) by mouth every 6 (six) hours as needed for nausea or vomiting. (Patient not taking: Reported on 07/07/2017) 30 tablet 0   No current facility-administered medications for this visit.     SURGICAL HISTORY:  Past Surgical History:  Procedure Laterality Date  . IR GUIDED DRAIN W CATHETER PLACEMENT   05/19/2017  . IR THORACENTESIS ASP PLEURAL SPACE W/IMG GUIDE  03/16/2017  . VIDEO BRONCHOSCOPY Bilateral 03/25/2017   Procedure: VIDEO BRONCHOSCOPY WITH FLUORO;  Surgeon: Juanito Doom, MD;  Location: [facility];  Service: Cardiopulmonary;  Laterality: Bilateral;    REVIEW OF SYSTEMS:   Review of Systems  Constitutional: Negative for chills, fever.  Positive for fatigue and weight loss.  HENT:   Negative for mouth sores, nosebleeds, sore throat and trouble swallowing.   Eyes: Negative for eye problems and icterus.  Respiratory: Negative for cough, hemoptysis, and wheezing.  He has his baseline shortness of breath and wears home oxygen.  Cardiovascular: Negative for chest pain and leg swelling.  Gastrointestinal: Negative for abdominal pain, constipation, diarrhea, nausea and vomiting.  Genitourinary: Negative for hematuria.  A Foley catheter is in place.  Reports difficulty voiding. Musculoskeletal: Negative for back pain, neck pain and neck stiffness.  Skin: Negative for itching and rash.  Neurological: Negative for dizziness, extremity weakness, headaches, light-headedness and seizures.  Hematological: Negative for adenopathy. Does not bruise/bleed easily.  Psychiatric/Behavioral: Negative for confusion, depression and sleep disturbance. The patient is not nervous/anxious.     PHYSICAL EXAMINATION:  Blood pressure (!) 100/50, pulse 85, temperature 97.6 F (36.4 C), temperature source Oral, resp. rate 16, height 5\' 11"  (1.803 m), SpO2 94 %.  ECOG PERFORMANCE STATUS: 1 - Symptomatic but completely ambulatory  Physical Exam  Constitutional: Oriented to person, place, and time. No distress.  HENT:  Head: Normocephalic and atraumatic.  Mouth/Throat: Oropharynx is clear and moist. No oropharyngeal exudate.  Eyes: Conjunctivae are normal. Right eye exhibits no discharge. Left eye exhibits no discharge. No scleral icterus.  Neck: Normal range of motion. Neck supple.   Cardiovascular: Normal rate, regular rhythm, normal heart sounds and intact distal pulses.   Pulmonary/Chest: Effort normal.  Diminished breath sounds left base.  No respiratory distress. No wheezes. No rales.  Abdominal: Soft. Bowel sounds are normal. Exhibits no distension and no mass. There is no tenderness.  Musculoskeletal: Normal range of motion. Exhibits no edema.  Lymphadenopathy:    No cervical adenopathy.  Neurological: Alert and oriented to person, place, and time. Exhibits normal muscle tone. Coordination normal.  Skin: Skin is warm and dry. No rash noted. Not diaphoretic. No erythema. No pallor.  Psychiatric: Mood, memory and judgment normal.  Vitals reviewed.  LABORATORY DATA: Lab Results  Component Value Date   WBC 7.9 07/07/2017   HGB 10.7 (L) 07/07/2017   HCT 33.8 (L) 07/07/2017   MCV 87.3 07/07/2017   PLT 287 07/07/2017      Chemistry      Component Value Date/Time   NA 139 07/07/2017 0931   NA 138 07/03/2017   NA 140 04/22/2017 1428   K 4.8 07/07/2017 0931   K 3.9 04/22/2017 1428   CL 101 07/07/2017 0931   CO2 27 07/07/2017 0931   CO2 28 04/22/2017 1428   BUN 18 07/07/2017 0931  BUN 19 07/03/2017   BUN 16.4 04/22/2017 1428   CREATININE 0.62 (L) 07/07/2017 0931   CREATININE 0.7 04/22/2017 1428   GLU 138 07/03/2017      Component Value Date/Time   CALCIUM 9.6 07/07/2017 0931   CALCIUM 9.6 04/22/2017 1428   ALKPHOS 289 (H) 07/07/2017 0931   ALKPHOS 110 04/22/2017 1428   AST 10 07/07/2017 0931   AST 18 04/22/2017 1428   ALT 59 (H) 07/07/2017 0931   ALT 22 04/22/2017 1428   BILITOT 0.6 07/07/2017 0931   BILITOT 0.58 04/22/2017 1428       RADIOGRAPHIC STUDIES:  No results found.   ASSESSMENT/PLAN:  Stage IV squamous cell carcinoma of left lung ([place]) This is a very pleasant 77 year old white male with a stage IV non-small cell lung cancer, squamous cell carcinoma. The patient received 1 cycle of systemic chemotherapy with carboplatin,  paclitaxel and Keytruda.  He had a very difficult time with his treatment and required lengthy hospitalization for multifocal pneumonia with acute respiratory failure and hypoxia.  The patient is now at a skilled nursing facility for rehabilitation and is trying to improve his stamina.  The patient was seen with Dr. [name].  Treatment options were discussed with the patient and his wife.  He had a very difficult time with his first cycle of treatment.  We discussed eliminating the carboplatin and Taxol and proceeding with single agent Keytruda beginning with cycle #2. Adverse effects of this treatment were discussed including but not limited to immune mediated the skin rash, diarrhea, inflammation of the lung, kidney, liver, thyroid or other endocrine dysfunction.  The patient is interested in proceeding with Keytruda only.  We will plan to get him started within the next 10 days.  Prior to resuming treatment, he will have a baseline CT scan of the chest, abdomen, pelvis.  The patient will follow up in 7-10 days to discuss his CT scan results and for evaluation prior to cycle 1 of Keytruda.  For the malnutrition, I have referred him to the dietitian.  He was advised to call immediately if he has any concerning symptoms in the interval. The patient voices understanding of current disease status and treatment options and is in agreement with the current care plan. All questions were answered. The patient knows to call the clinic with any problems, questions or concerns. We can certainly see the patient much sooner if necessary.  Orders Placed This Encounter  Procedures  . CT ABDOMEN PELVIS W CONTRAST    Standing Status:   Future    Standing Expiration Date:   07/08/2018    Order Specific Question:   If indicated for the ordered procedure, I authorize the administration of contrast media per Radiology protocol    Answer:   Yes    Order Specific Question:   Preferred imaging location?    Answer:    [facility]    Order Specific Question:   Radiology Contrast Protocol - do NOT remove file path    Answer:   [email or web address]    Order Specific Question:   Reason for Exam additional comments    Answer:   Stage IV lung cancer. Restaging.  . CT CHEST W CONTRAST    Standing Status:   Future    Standing Expiration Date:   07/08/2018    Order Specific Question:   If indicated for the ordered procedure, I authorize the administration of contrast media per Radiology protocol    Answer:  Yes    Order Specific Question:   Preferred imaging location?    Answer:   [facility]    Order Specific Question:   Radiology Contrast Protocol - do NOT remove file path    Answer:   [email or web address]    Order Specific Question:   Reason for Exam additional comments    Answer:   Stage IV lung cancer. Restaging.  Marland Kitchen CBC with Differential (Cancer Center Only)    Standing Status:   Standing    Number of Occurrences:   20    Standing Expiration Date:   07/08/2018  . CMP ([facility] only)    Standing Status:   Standing    Number of Occurrences:   20    Standing Expiration Date:   07/08/2018  . TSH    Standing Status:   Standing    Number of Occurrences:   20    Standing Expiration Date:   07/08/2018  . Amb Referral to Nutrition and Diabetic E    Referral Priority:   Routine    Referral Type:   Consultation    Referral Reason:   Specialty Services Required    Number of Visits Requested:   1    Mikey Bussing, DNP, AGPCNP-BC, AOCNP 07/07/17  ADDENDUM: Hematology/Oncology Attending: I had a face-to-face encounter with the patient today.  I recommended his care plan.  This is a very pleasant 77 years old white male with a stage IV non-small cell lung cancer, squamous cell carcinoma.  The patient is status post Pleurx catheter placement for drainage of left pleural effusion.  He is also status post 1 cycle of systemic chemotherapy with  carboplatin, paclitaxel and Keytruda.  The first cycle of his treatment was on April 15, 2017.  He has been off treatment since that time because of significant complication and intolerance to the chemotherapy.  He has several weeks of hospitalization and he is currently on a rehabilitation facility for physical therapy. He started feeling a little bit better. He is here today for evaluation and recommendation regarding management of his condition. I had a lengthy discussion with the patient and his wife about his condition.  I recommended for him to have repeat CT scan of the chest, abdomen and pelvis for restaging of his disease before resuming any further treatment. I also discussed with the patient treatment with single agent Keytruda rather than the combination of chemotherapy and Keytruda because of the significant adverse effect and intolerance to the chemotherapy. The patient is interested in this option and he will be treated with Keytruda 200 mg IV every 3 weeks.  First dose will be on July 16, 2017. We will see him back for follow-up visit at that time for evaluation and discussion of his discuss results before the first dose of his treatment. The patient was advised to call immediately if he has any concerning symptoms in the interval.  Disclaimer: This note was dictated with voice recognition software. Similar sounding words can inadvertently be transcribed and may be missed upon review. Eilleen Kempf, MD 07/07/17

## 2017-07-07 NOTE — Patient Instructions (Signed)
Pembrolizumab injection  What is this medicine?  PEMBROLIZUMAB (pem broe liz ue mab) is a monoclonal antibody. It is used to treat melanoma, head and neck cancer, Hodgkin lymphoma, non-small cell lung cancer, urothelial cancer, stomach cancer, and cancers that have a certain genetic condition.  This medicine may be used for other purposes; ask your health care provider or pharmacist if you have questions.  COMMON BRAND NAME(S): Keytruda  What should I tell my health care provider before I take this medicine?  They need to know if you have any of these conditions:  -diabetes  -immune system problems  -inflammatory bowel disease  -liver disease  -lung or breathing disease  -lupus  -organ transplant  -an unusual or allergic reaction to pembrolizumab, other medicines, foods, dyes, or preservatives  -pregnant or trying to get pregnant  -breast-feeding  How should I use this medicine?  This medicine is for infusion into a vein. It is given by a health care professional in a hospital or clinic setting.  A special MedGuide will be given to you before each treatment. Be sure to read this information carefully each time.  Talk to your pediatrician regarding the use of this medicine in children. While this drug may be prescribed for selected conditions, precautions do apply.  Overdosage: If you think you have taken too much of this medicine contact a poison control center or emergency room at once.  NOTE: This medicine is only for you. Do not share this medicine with others.  What if I miss a dose?  It is important not to miss your dose. Call your doctor or health care professional if you are unable to keep an appointment.  What may interact with this medicine?  Interactions have not been studied.  Give your health care provider a list of all the medicines, herbs, non-prescription drugs, or dietary supplements you use. Also tell them if you smoke, drink alcohol, or use illegal drugs. Some items may interact with your  medicine.  This list may not describe all possible interactions. Give your health care provider a list of all the medicines, herbs, non-prescription drugs, or dietary supplements you use. Also tell them if you smoke, drink alcohol, or use illegal drugs. Some items may interact with your medicine.  What should I watch for while using this medicine?  Your condition will be monitored carefully while you are receiving this medicine.  You may need blood work done while you are taking this medicine.  Do not become pregnant while taking this medicine or for 4 months after stopping it. Women should inform their doctor if they wish to become pregnant or think they might be pregnant. There is a potential for serious side effects to an unborn child. Talk to your health care professional or pharmacist for more information. Do not breast-feed an infant while taking this medicine or for 4 months after the last dose.  What side effects may I notice from receiving this medicine?  Side effects that you should report to your doctor or health care professional as soon as possible:  -allergic reactions like skin rash, itching or hives, swelling of the face, lips, or tongue  -bloody or black, tarry  -breathing problems  -changes in vision  -chest pain  -chills  -constipation  -cough  -dizziness or feeling faint or lightheaded  -fast or irregular heartbeat  -fever  -flushing  -hair loss  -low blood counts - this medicine may decrease the number of white blood cells, red blood cells   and platelets. You may be at increased risk for infections and bleeding.  -muscle pain  -muscle weakness  -persistent headache  -signs and symptoms of high blood sugar such as dizziness; dry mouth; dry skin; fruity breath; nausea; stomach pain; increased hunger or thirst; increased urination  -signs and symptoms of kidney injury like trouble passing urine or change in the amount of urine  -signs and symptoms of liver injury like dark urine, light-colored  stools, loss of appetite, nausea, right upper belly pain, yellowing of the eyes or skin  -stomach pain  -sweating  -weight loss  Side effects that usually do not require medical attention (report to your doctor or health care professional if they continue or are bothersome):  -decreased appetite  -diarrhea  -tiredness  This list may not describe all possible side effects. Call your doctor for medical advice about side effects. You may report side effects to FDA at 1-800-FDA-1088.  Where should I keep my medicine?  This drug is given in a hospital or clinic and will not be stored at home.  NOTE: This sheet is a summary. It may not cover all possible information. If you have questions about this medicine, talk to your doctor, pharmacist, or health care provider.   2018 Elsevier/Gold Standard (2016-01-29 12:29:36)

## 2017-07-07 NOTE — Assessment & Plan Note (Signed)
This is a very pleasant 77 year old white male with a stage IV non-small cell lung cancer, squamous cell carcinoma. The patient received 1 cycle of systemic chemotherapy with carboplatin, paclitaxel and Keytruda.  He had a very difficult time with his treatment and required lengthy hospitalization for multifocal pneumonia with acute respiratory failure and hypoxia.  The patient is now at a skilled nursing facility for rehabilitation and is trying to improve his stamina.  The patient was seen with Dr. [name].  Treatment options were discussed with the patient and his wife.  He had a very difficult time with his first cycle of treatment.  We discussed eliminating the carboplatin and Taxol and proceeding with single agent Keytruda beginning with cycle #2. Adverse effects of this treatment were discussed including but not limited to immune mediated the skin rash, diarrhea, inflammation of the lung, kidney, liver, thyroid or other endocrine dysfunction.  The patient is interested in proceeding with Keytruda only.  We will plan to get him started within the next 10 days.  Prior to resuming treatment, he will have a baseline CT scan of the chest, abdomen, pelvis.  The patient will follow up in 7-10 days to discuss his CT scan results and for evaluation prior to cycle 1 of Keytruda.  For the malnutrition, I have referred him to the dietitian.  He was advised to call immediately if he has any concerning symptoms in the interval. The patient voices understanding of current disease status and treatment options and is in agreement with the current care plan. All questions were answered. The patient knows to call the clinic with any problems, questions or concerns. We can certainly see the patient much sooner if necessary.

## 2017-07-08 ENCOUNTER — Ambulatory Visit: Payer: Medicare Other | Admitting: Internal Medicine

## 2017-07-08 ENCOUNTER — Ambulatory Visit: Payer: Medicare Other

## 2017-07-08 ENCOUNTER — Other Ambulatory Visit: Payer: Medicare Other

## 2017-07-08 LAB — TSH: TSH: 3.597 u[IU]/mL (ref 0.320–4.118)

## 2017-07-09 ENCOUNTER — Telehealth: Payer: Self-pay | Admitting: *Deleted

## 2017-07-09 NOTE — Telephone Encounter (Signed)
Oncology Nurse Navigator Documentation  Oncology Nurse Navigator Flowsheets 07/09/2017  Navigator Location [facility]  Navigator Encounter Type Telephone/I followed up on Maurice Tyler's schedule. He needs CT chest/abd/pelvis before his appt with Mikey Bussing next week.  I called patient home and wife states he is at [facility]. I called central scheduling to get a time for scan, then called [facility] to arrange appt with them, I spoke to the nurse at [facility] to give them pre-procedure instructions. I then called the wife and asked her to get the contrast for patient. She verbalized understanding of appt.   Telephone Outgoing Call  Treatment Phase Treatment  Barriers/Navigation Needs Coordination of Care;Education  Education Other  Interventions Coordination of Care;Education  Coordination of Care Appts  Education Method Verbal  Acuity Level 2  Time Spent with Patient 66

## 2017-07-10 ENCOUNTER — Encounter: Payer: Self-pay | Admitting: Adult Health

## 2017-07-10 ENCOUNTER — Ambulatory Visit: Payer: Medicare Other

## 2017-07-10 ENCOUNTER — Non-Acute Institutional Stay (SKILLED_NURSING_FACILITY): Payer: Medicare Other | Admitting: Adult Health

## 2017-07-10 DIAGNOSIS — C3492 Malignant neoplasm of unspecified part of left bronchus or lung: Secondary | ICD-10-CM

## 2017-07-10 DIAGNOSIS — B370 Candidal stomatitis: Secondary | ICD-10-CM

## 2017-07-10 DIAGNOSIS — J9601 Acute respiratory failure with hypoxia: Secondary | ICD-10-CM | POA: Diagnosis not present

## 2017-07-10 DIAGNOSIS — I4891 Unspecified atrial fibrillation: Secondary | ICD-10-CM | POA: Diagnosis not present

## 2017-07-10 NOTE — Progress Notes (Signed)
Location:  [facility] Room Number: 371-G Place of Service:  SNF (31) Provider:  Durenda Age, NP  Patient Care Team: [name], MD as PCP - General (Internal Medicine) Minus Breeding, MD as PCP - Cardiology (Cardiology)  Extended Emergency Contact Information Primary Emergency Contact: Eppolito,Corinne Address: [address]          [place], [place] [address] Johnnette Litter of [place] Phone: [phone] Relation: Spouse Secondary Emergency Contact: Prudy Feeler Mobile Phone: [phone] Relation: Daughter Preferred language: English Interpreter needed? No  Code Status:  Full Code Goals of care: Advanced Directive information Advanced Directives 06/24/2017  Does Patient Have a Medical Advance Directive? No  Type of Advance Directive -  Would patient like information on creating a medical advance directive? -     Chief Complaint  Patient presents with  . Acute Visit    The patient has oral candidiasis    HPI:  Pt is a 77 y.o. male seen today for an acute visit due to oral candidiasis.  He is a short-term rehabilitation resident at [facility].  He has a PMH of DM without complication, malignant neoplasm of the lung, AKI, and empyema of the lung. He was seen in his room today due to complaints of having O2 sat at 87%. He is currently on O2 @ 2L/min via  continuously. Increased O2 to 3L/min and encouraged deep breathing exercises. O2 sat went up to 90%. Wife was at bedside. He has pleurx on his left lower chest and was drained earlier - 150 ml. He denies having SOB nor chest pains. Noted his tongue to have thick white coating. He denies oral pain. He was noted to have difficulty talking ( "losing my voice') and was whispering. No redness noted on the throat. He denies sore throat.   Past Medical History:  Diagnosis Date  . Diabetes mellitus without complication ([place])   . Malignant neoplasm of unspecified part of  unspecified bronchus or lung ([place]) 03/30/2017   Past Surgical History:  Procedure Laterality Date  . IR GUIDED DRAIN W CATHETER PLACEMENT  05/19/2017  . IR THORACENTESIS ASP PLEURAL SPACE W/IMG GUIDE  03/16/2017  . VIDEO BRONCHOSCOPY Bilateral 03/25/2017   Procedure: VIDEO BRONCHOSCOPY WITH FLUORO;  Surgeon: Juanito Doom, MD;  Location: [facility];  Service: Cardiopulmonary;  Laterality: Bilateral;    No Known Allergies  Outpatient Encounter Medications as of 07/10/2017  Medication Sig  . acetaminophen (TYLENOL) 500 MG tablet Take 1,000 mg by mouth every 6 (six) hours as needed for moderate pain.  Marland Kitchen albuterol (PROVENTIL HFA;VENTOLIN HFA) 108 (90 Base) MCG/ACT inhaler Inhale 1 puff every 6 (six) hours as needed into the lungs for wheezing or shortness of breath.  Marland Kitchen amiodarone (PACERONE) 400 MG tablet Take 1 tablet (400 mg total) by mouth 2 (two) times daily.  . cetirizine (ZYRTEC) 10 MG tablet Take 10 mg daily by mouth. ALLERTEC  . furosemide (LASIX) 20 MG tablet Take 1 tablet (20 mg total) by mouth daily.  Marland Kitchen loperamide (IMODIUM A-D) 2 MG tablet Take 2 mg by mouth 3 (three) times daily as needed for diarrhea or loose stools.  Marland Kitchen LORazepam (ATIVAN) 0.5 MG tablet Take 1 tablet (0.5 mg total) by mouth every 6 (six) hours as needed for anxiety.  . metFORMIN (GLUCOPHAGE) 500 MG tablet Take 500 mg 2 (two) times daily with a meal by mouth.   . potassium chloride 20 MEQ/15ML (10%) SOLN Take 22.5 mLs (30 mEq total) by mouth daily.  Marland Kitchen  prochlorperazine (COMPAZINE) 10 MG tablet Take 1 tablet (10 mg total) by mouth every 6 (six) hours as needed for nausea or vomiting.  . tamsulosin (FLOMAX) 0.4 MG CAPS capsule Take 0.4 mg by mouth daily.  Marland Kitchen ACCU-CHEK FASTCLIX LANCETS MISC   . ACCU-CHEK GUIDE test strip USE TO CHECK BLOOD SUGAR BID  . [DISCONTINUED] NUTRITIONAL SUPPLEMENT LIQD Take 120 mLs by mouth daily. MedPass   No facility-administered encounter medications on file as of 07/10/2017.     Review  of Systems  GENERAL: No  fever, chills or weakness MOUTH and THROAT: "losing voice" RESPIRATORY: no cough, SOB, DOE, wheezing, hemoptysis CARDIAC: No chest pain, edema or palpitations GI: No abdominal pain, diarrhea, constipation, heart burn, nausea or vomiting PSYCHIATRIC: Denies feelings of depression or anxiety. No report of hallucinations, insomnia, paranoia, or agitation   Immunization History  Administered Date(s) Administered  . Influenza Split 02/08/2016, 02/10/2017  . Influenza,inj,Quad PF,6+ Mos 02/28/2014   Pertinent  Health Maintenance Due  Topic Date Due  . FOOT EXAM  08/05/1950  . OPHTHALMOLOGY EXAM  08/05/1950  . URINE MICROALBUMIN  [birth year]05/1950  . PNA vac Low Risk Adult (1 of 2 - PCV13) 08/05/2004  . HEMOGLOBIN A1C  11/19/2017  . INFLUENZA VACCINE  Completed   Fall Risk  06/24/2017  Falls in the past year? No      Vitals:   07/10/17 1336  BP: (!) 106/56  Pulse: 76  Resp: 18  Temp: (!) 96 F (35.6 C)  TempSrc: Oral  SpO2: 94%  Weight: 134 lb 9.6 oz (61.1 kg)  Height: 5\' 11"  (1.803 m)   Body mass index is 18.77 kg/m.  Physical Exam  GENERAL APPEARANCE:  In no acute distress. Thin  SKIN:  Sacrum has pressure ulcer with 1.5 X 1 cm with 100% yellow slough, small serous drainage MOUTH and THROAT: Lips are without lesions. Oral mucosa is moist and without lesions. Tongue has thick white coating RESPIRATORY: Breathing is even & unlabored, BS CTAB, O2 @ 3L/min via [place] continuously CARDIAC: RRR, no murmur,no extra heart sounds, no edema GI: Abdomen soft, normal BS, no masses, no tenderness GU:  Has foley catheter draining clear yellow urine EXTREMITIES:  Able to move X 4 extremities PSYCHIATRIC: Alert and oriented X 3. Affect and behavior are appropriate   Labs reviewed: Recent Labs    05/06/17 0316 05/07/17 0411 05/08/17 0422  05/11/17 0451  05/19/17 0455 05/20/17 0350 07/03/17 07/07/17 0931  NA 137  --  139   < > 138   < > 135 134* 138 139  K  4.1  --  3.6   < > 2.6*   < > 3.7 3.5 4.6 4.8  CL 102  --  98*   < > 91*   < > 95* 95*  --  101  CO2 28  --  35*   < > 39*   < > 31 30  --  27  GLUCOSE 178*  --  139*   < > 220*   < > 161* 185*  --  115  BUN 19  --  18   < > 16   < > 18 22* 19 18  CREATININE 0.38*  --  0.36*   < > 0.39*   < > 0.48* 0.50* 0.5* 0.62*  CALCIUM 7.8*  --  8.1*   < > 7.8*   < > 8.0* 8.0*  --  9.6  MG 1.8 1.8 1.8   < > 1.9  --  2.0 2.0  --   --   PHOS 2.6 3.0 2.7  --   --   --   --   --   --   --    < > = values in this interval not displayed.   Recent Labs    05/09/17 0730 05/10/17 0458 05/18/17 0442 07/07/17 0931  AST 20  --  31 10  ALT 21  --  75* 59*  ALKPHOS 94  --  104 289*  BILITOT 0.6  --  0.4 0.6  PROT 4.2*  --  5.5* 6.1*  ALBUMIN 1.6* 1.8* 2.1* 2.4*   Recent Labs    05/19/17 0455 05/20/17 0350 07/03/17 07/07/17 0931  WBC 6.4 6.0 8.6 7.9  NEUTROABS 5.1 4.8 7 6.7*  HGB 10.4* 10.1* 9.5* 10.7*  HCT 32.3* 31.7* 29* 33.8*  MCV 93.4 93.5  --  87.3  PLT 306 324 279 287   Lab Results  Component Value Date   TSH 3.597 07/07/2017   Lab Results  Component Value Date   HGBA1C 7.4 05/22/2017     Assessment/Plan  1. Acute respiratory failure with hypoxia (HCC) - increased O2 to 3L/min via [place], encouraged deep breathing exercises, O2 sat went up to 90%, check O2 sat Q shift X 1 week   2. Non-small cell cancer of left lung ([place]) - follows-up with oncology   3. Oral candida - will start Clotrimazole troche 10 mg 1 tab dissolve slowly in mouth 5X/day X 10 days   4. New onset a-fib [facility]) - review of vitals done and noted BPs to be low, 106/56, 103/52. 131/68, 92/54, and HRs 76, 80, 78, 81, 72, he is currently on Amiodarone 400 mg 1 tab BID, Metoprolol has been discontinued, will need to follow-up with cardiology     Durenda Age, NP [facility] [phone] (Monday-Friday 8:00 a.m. - 5:00 p.m.) [phone] (after hours)

## 2017-07-13 ENCOUNTER — Encounter: Payer: Medicare Other | Admitting: Nutrition

## 2017-07-13 ENCOUNTER — Encounter: Payer: Self-pay | Admitting: Nutrition

## 2017-07-13 NOTE — Progress Notes (Signed)
Patient's wife called and cancelled patient's nutrition appointment. Patient is currently at [facility]. She reports concern with patient's weight loss. States he is drinking 2 oral nutrition supplements daily plus a magic cup. She is concerned with his "mood" and doesn't know if he is depressed. States he was in good spirits when visiting with Dr. [name] last week. I will place a call to the RD at [facility] and ask her to assess patient and ensure he has adequate food choices available. Encouraged patient's wife to contact patient's MD. Wife appreciative of call.

## 2017-07-14 ENCOUNTER — Encounter (HOSPITAL_COMMUNITY): Payer: Self-pay

## 2017-07-14 ENCOUNTER — Telehealth: Payer: Self-pay | Admitting: *Deleted

## 2017-07-14 ENCOUNTER — Ambulatory Visit (HOSPITAL_COMMUNITY)
Admission: RE | Admit: 2017-07-14 | Discharge: 2017-07-14 | Disposition: A | Discharge status: Home / Self Care | Payer: Medicare Other | Source: Ambulatory Visit | Attending: Oncology | Admitting: Oncology

## 2017-07-14 DIAGNOSIS — C3492 Malignant neoplasm of unspecified part of left bronchus or lung: Secondary | ICD-10-CM | POA: Diagnosis present

## 2017-07-14 DIAGNOSIS — K222 Esophageal obstruction: Secondary | ICD-10-CM | POA: Diagnosis not present

## 2017-07-14 DIAGNOSIS — J439 Emphysema, unspecified: Secondary | ICD-10-CM | POA: Diagnosis not present

## 2017-07-14 DIAGNOSIS — I313 Pericardial effusion (noninflammatory): Secondary | ICD-10-CM | POA: Insufficient documentation

## 2017-07-14 DIAGNOSIS — N210 Calculus in bladder: Secondary | ICD-10-CM | POA: Diagnosis not present

## 2017-07-14 DIAGNOSIS — K228 Other specified diseases of esophagus: Secondary | ICD-10-CM | POA: Diagnosis not present

## 2017-07-14 DIAGNOSIS — J948 Other specified pleural conditions: Secondary | ICD-10-CM | POA: Insufficient documentation

## 2017-07-14 DIAGNOSIS — I700 Atherosclerosis of aorta: Secondary | ICD-10-CM | POA: Diagnosis not present

## 2017-07-14 MED ORDER — IOPAMIDOL (ISOVUE-300) INJECTION 61%
100.0000 mL | Freq: Once | INTRAVENOUS | Status: AC | PRN
  Administered 2017-07-14: 80 mL via INTRAVENOUS

## 2017-07-14 MED ORDER — IOPAMIDOL (ISOVUE-300) INJECTION 61%
INTRAVENOUS | Status: AC
  Administered 2017-07-14: 80 mL via INTRAVENOUS
  Filled 2017-07-14: qty 100

## 2017-07-14 MED ORDER — SODIUM CHLORIDE 0.9 % IJ SOLN
INTRAMUSCULAR | Status: AC
  Filled 2017-07-14: qty 50

## 2017-07-14 NOTE — Telephone Encounter (Signed)
Received call from radiology regarding results.  Printed and reviewed with Myrtha Mantis, NP.  She is aware and patient is scheduled for visit tomorrow.

## 2017-07-15 ENCOUNTER — Ambulatory Visit: Payer: Medicare Other

## 2017-07-15 ENCOUNTER — Other Ambulatory Visit: Payer: Medicare Other

## 2017-07-15 ENCOUNTER — Ambulatory Visit: Payer: Medicare Other | Admitting: Oncology

## 2017-07-16 ENCOUNTER — Telehealth: Payer: Self-pay | Admitting: Oncology

## 2017-07-16 NOTE — Telephone Encounter (Signed)
Scheduled appt per 3/13 sch message - patients wife is aware of appt date and time.

## 2017-07-17 ENCOUNTER — Inpatient Hospital Stay (HOSPITAL_COMMUNITY)
Admission: EM | Admit: 2017-07-17 | Discharge: 2017-07-22 | DRG: 189 | Disposition: A | Discharge status: Hospice | Payer: Medicare Other | Attending: Internal Medicine | Admitting: Internal Medicine

## 2017-07-17 ENCOUNTER — Other Ambulatory Visit: Payer: Self-pay

## 2017-07-17 ENCOUNTER — Emergency Department (HOSPITAL_COMMUNITY): Payer: Medicare Other

## 2017-07-17 ENCOUNTER — Encounter: Payer: Self-pay | Admitting: Adult Health

## 2017-07-17 ENCOUNTER — Encounter (HOSPITAL_COMMUNITY): Payer: Self-pay | Admitting: *Deleted

## 2017-07-17 ENCOUNTER — Non-Acute Institutional Stay (SKILLED_NURSING_FACILITY): Payer: Medicare Other | Admitting: Adult Health

## 2017-07-17 ENCOUNTER — Telehealth: Payer: Self-pay | Admitting: Medical Oncology

## 2017-07-17 DIAGNOSIS — E440 Moderate protein-calorie malnutrition: Secondary | ICD-10-CM | POA: Diagnosis not present

## 2017-07-17 DIAGNOSIS — C3492 Malignant neoplasm of unspecified part of left bronchus or lung: Secondary | ICD-10-CM | POA: Diagnosis not present

## 2017-07-17 DIAGNOSIS — Z681 Body mass index (BMI) 19 or less, adult: Secondary | ICD-10-CM

## 2017-07-17 DIAGNOSIS — R0600 Dyspnea, unspecified: Secondary | ICD-10-CM

## 2017-07-17 DIAGNOSIS — Z9689 Presence of other specified functional implants: Secondary | ICD-10-CM

## 2017-07-17 DIAGNOSIS — J9601 Acute respiratory failure with hypoxia: Secondary | ICD-10-CM

## 2017-07-17 DIAGNOSIS — Z515 Encounter for palliative care: Secondary | ICD-10-CM | POA: Diagnosis present

## 2017-07-17 DIAGNOSIS — T380X5A Adverse effect of glucocorticoids and synthetic analogues, initial encounter: Secondary | ICD-10-CM | POA: Diagnosis not present

## 2017-07-17 DIAGNOSIS — Z79899 Other long term (current) drug therapy: Secondary | ICD-10-CM

## 2017-07-17 DIAGNOSIS — I959 Hypotension, unspecified: Secondary | ICD-10-CM | POA: Diagnosis present

## 2017-07-17 DIAGNOSIS — E119 Type 2 diabetes mellitus without complications: Secondary | ICD-10-CM

## 2017-07-17 DIAGNOSIS — R0902 Hypoxemia: Secondary | ICD-10-CM | POA: Diagnosis not present

## 2017-07-17 DIAGNOSIS — J440 Chronic obstructive pulmonary disease with acute lower respiratory infection: Secondary | ICD-10-CM | POA: Diagnosis present

## 2017-07-17 DIAGNOSIS — I4891 Unspecified atrial fibrillation: Secondary | ICD-10-CM

## 2017-07-17 DIAGNOSIS — C3412 Malignant neoplasm of upper lobe, left bronchus or lung: Secondary | ICD-10-CM | POA: Diagnosis present

## 2017-07-17 DIAGNOSIS — I482 Chronic atrial fibrillation, unspecified: Secondary | ICD-10-CM | POA: Diagnosis present

## 2017-07-17 DIAGNOSIS — Z9181 History of falling: Secondary | ICD-10-CM

## 2017-07-17 DIAGNOSIS — J910 Malignant pleural effusion: Secondary | ICD-10-CM | POA: Diagnosis present

## 2017-07-17 DIAGNOSIS — I952 Hypotension due to drugs: Secondary | ICD-10-CM | POA: Diagnosis not present

## 2017-07-17 DIAGNOSIS — J9621 Acute and chronic respiratory failure with hypoxia: Secondary | ICD-10-CM | POA: Diagnosis present

## 2017-07-17 DIAGNOSIS — Z66 Do not resuscitate: Secondary | ICD-10-CM | POA: Diagnosis present

## 2017-07-17 DIAGNOSIS — J441 Chronic obstructive pulmonary disease with (acute) exacerbation: Secondary | ICD-10-CM | POA: Diagnosis present

## 2017-07-17 DIAGNOSIS — C3482 Malignant neoplasm of overlapping sites of left bronchus and lung: Secondary | ICD-10-CM

## 2017-07-17 DIAGNOSIS — J9600 Acute respiratory failure, unspecified whether with hypoxia or hypercapnia: Secondary | ICD-10-CM | POA: Diagnosis present

## 2017-07-17 DIAGNOSIS — Z9221 Personal history of antineoplastic chemotherapy: Secondary | ICD-10-CM

## 2017-07-17 DIAGNOSIS — Z7984 Long term (current) use of oral hypoglycemic drugs: Secondary | ICD-10-CM

## 2017-07-17 DIAGNOSIS — J189 Pneumonia, unspecified organism: Secondary | ICD-10-CM | POA: Diagnosis present

## 2017-07-17 DIAGNOSIS — Z87891 Personal history of nicotine dependence: Secondary | ICD-10-CM

## 2017-07-17 LAB — BLOOD GAS, ARTERIAL
Acid-Base Excess: 5.7 mmol/L — ABNORMAL HIGH (ref 0.0–2.0)
Bicarbonate: 29.1 mmol/L — ABNORMAL HIGH (ref 20.0–28.0)
Drawn by: 308601
O2 Content: 6 L/min
O2 Saturation: 98.6 %
PATIENT TEMPERATURE: 98.6
PO2 ART: 123 mmHg — AB (ref 83.0–108.0)
pCO2 arterial: 39 mmHg (ref 32.0–48.0)
pH, Arterial: 7.486 — ABNORMAL HIGH (ref 7.350–7.450)

## 2017-07-17 LAB — CBC WITH DIFFERENTIAL/PLATELET
BASOS PCT: 0 %
Basophils Absolute: 0 10*3/uL (ref 0.0–0.1)
Eosinophils Absolute: 0.2 10*3/uL (ref 0.0–0.7)
Eosinophils Relative: 2 %
HEMATOCRIT: 33.9 % — AB (ref 39.0–52.0)
HEMOGLOBIN: 10 g/dL — AB (ref 13.0–17.0)
LYMPHS PCT: 6 %
Lymphs Abs: 0.6 10*3/uL — ABNORMAL LOW (ref 0.7–4.0)
MCH: 27.1 pg (ref 26.0–34.0)
MCHC: 29.5 g/dL — AB (ref 30.0–36.0)
MCV: 91.9 fL (ref 78.0–100.0)
Monocytes Absolute: 0.5 10*3/uL (ref 0.1–1.0)
Monocytes Relative: 4 %
NEUTROS ABS: 9.8 10*3/uL — AB (ref 1.7–7.7)
NEUTROS PCT: 88 %
Platelets: 345 10*3/uL (ref 150–400)
RBC: 3.69 MIL/uL — ABNORMAL LOW (ref 4.22–5.81)
RDW: 16 % — ABNORMAL HIGH (ref 11.5–15.5)
WBC: 11.1 10*3/uL — ABNORMAL HIGH (ref 4.0–10.5)

## 2017-07-17 LAB — COMPREHENSIVE METABOLIC PANEL
ALBUMIN: 2.3 g/dL — AB (ref 3.5–5.0)
ALT: 20 U/L (ref 17–63)
ANION GAP: 4 — AB (ref 5–15)
AST: 15 U/L (ref 15–41)
Alkaline Phosphatase: 129 U/L — ABNORMAL HIGH (ref 38–126)
BUN: 24 mg/dL — ABNORMAL HIGH (ref 6–20)
CHLORIDE: 102 mmol/L (ref 101–111)
CO2: 33 mmol/L — AB (ref 22–32)
Calcium: 8.6 mg/dL — ABNORMAL LOW (ref 8.9–10.3)
Creatinine, Ser: 0.64 mg/dL (ref 0.61–1.24)
GFR calc Af Amer: >60 mL/min (ref >60)
GFR calc non Af Amer: >60 mL/min (ref >60)
Glucose, Bld: 124 mg/dL — ABNORMAL HIGH (ref 65–99)
POTASSIUM: 4.4 mmol/L (ref 3.5–5.1)
SODIUM: 139 mmol/L (ref 135–145)
TOTAL PROTEIN: 5.6 g/dL — AB (ref 6.5–8.1)
Total Bilirubin: 0.4 mg/dL (ref 0.3–1.2)

## 2017-07-17 LAB — LIPASE, BLOOD: Lipase: 18 U/L (ref 11–51)

## 2017-07-17 LAB — GLUCOSE, CAPILLARY: Glucose-Capillary: 146 mg/dL — ABNORMAL HIGH (ref 65–99)

## 2017-07-17 LAB — LACTIC ACID, PLASMA: Lactic Acid, Venous: 1 mmol/L (ref 0.5–1.9)

## 2017-07-17 LAB — PROCALCITONIN: Procalcitonin: 0.19 ng/mL

## 2017-07-17 MED ORDER — CHLORHEXIDINE GLUCONATE 0.12 % MT SOLN
15.0000 mL | Freq: Two times a day (BID) | OROMUCOSAL | Status: DC
  Administered 2017-07-17 – 2017-07-22 (×6): 15 mL via OROMUCOSAL
  Filled 2017-07-17 (×8): qty 15

## 2017-07-17 MED ORDER — SULFAMETHOXAZOLE-TRIMETHOPRIM 800-160 MG PO TABS: 1.0000 | ORAL_TABLET | Freq: Two times a day (BID) | ORAL | Status: DC

## 2017-07-17 MED ORDER — LOPERAMIDE HCL 2 MG PO CAPS: 2.0000 mg | ORAL_CAPSULE | Freq: Three times a day (TID) | ORAL | Status: DC | PRN

## 2017-07-17 MED ORDER — ONDANSETRON HCL 4 MG/2ML IJ SOLN: 4.0000 mg | Freq: Three times a day (TID) | INTRAMUSCULAR | Status: DC | PRN

## 2017-07-17 MED ORDER — TAMSULOSIN HCL 0.4 MG PO CAPS
0.4000 mg | ORAL_CAPSULE | Freq: Every day | ORAL | Status: DC
  Administered 2017-07-17 – 2017-07-19 (×3): 0.4 mg via ORAL
  Filled 2017-07-17 (×3): qty 1

## 2017-07-17 MED ORDER — LORATADINE 10 MG PO TABS
10.0000 mg | ORAL_TABLET | Freq: Every day | ORAL | Status: DC
  Administered 2017-07-18 – 2017-07-19 (×2): 10 mg via ORAL
  Filled 2017-07-17 (×2): qty 1

## 2017-07-17 MED ORDER — SODIUM CHLORIDE 0.9 % IV SOLN
INTRAVENOUS | Status: DC
  Administered 2017-07-17 – 2017-07-19 (×4): via INTRAVENOUS

## 2017-07-17 MED ORDER — ALBUTEROL SULFATE (2.5 MG/3ML) 0.083% IN NEBU
2.5000 mg | INHALATION_SOLUTION | RESPIRATORY_TRACT | Status: DC | PRN
  Administered 2017-07-18 (×2): 2.5 mg via RESPIRATORY_TRACT
  Filled 2017-07-17 (×2): qty 3

## 2017-07-17 MED ORDER — IPRATROPIUM-ALBUTEROL 0.5-2.5 (3) MG/3ML IN SOLN
3.0000 mL | Freq: Three times a day (TID) | RESPIRATORY_TRACT | Status: DC
  Filled 2017-07-17: qty 3

## 2017-07-17 MED ORDER — ACETAMINOPHEN 325 MG PO TABS: 650.0000 mg | ORAL_TABLET | Freq: Four times a day (QID) | ORAL | Status: DC | PRN

## 2017-07-17 MED ORDER — INSULIN ASPART 100 UNIT/ML ~~LOC~~ SOLN: 0.0000 [IU] | Freq: Every day | SUBCUTANEOUS | Status: DC

## 2017-07-17 MED ORDER — GUAIFENESIN 100 MG/5ML PO SOLN: 10.0000 mL | ORAL | Status: DC | PRN

## 2017-07-17 MED ORDER — ORAL CARE MOUTH RINSE
15.0000 mL | Freq: Two times a day (BID) | OROMUCOSAL | Status: DC
  Administered 2017-07-18 – 2017-07-19 (×2): 15 mL via OROMUCOSAL

## 2017-07-17 MED ORDER — ENOXAPARIN SODIUM 40 MG/0.4ML ~~LOC~~ SOLN
40.0000 mg | SUBCUTANEOUS | Status: DC
  Administered 2017-07-17 – 2017-07-18 (×2): 40 mg via SUBCUTANEOUS
  Filled 2017-07-17 (×2): qty 0.4

## 2017-07-17 MED ORDER — PROCHLORPERAZINE MALEATE 10 MG PO TABS: 10.0000 mg | ORAL_TABLET | Freq: Four times a day (QID) | ORAL | Status: DC | PRN

## 2017-07-17 MED ORDER — AMIODARONE HCL 200 MG PO TABS
400.0000 mg | ORAL_TABLET | Freq: Two times a day (BID) | ORAL | Status: DC
  Administered 2017-07-17 – 2017-07-19 (×4): 400 mg via ORAL
  Filled 2017-07-17 (×8): qty 2

## 2017-07-17 MED ORDER — MENTHOL 3 MG MT LOZG: 1.0000 | LOZENGE | Freq: Four times a day (QID) | OROMUCOSAL | Status: DC | PRN

## 2017-07-17 MED ORDER — INSULIN ASPART 100 UNIT/ML ~~LOC~~ SOLN
0.0000 [IU] | Freq: Three times a day (TID) | SUBCUTANEOUS | Status: DC
  Administered 2017-07-18: 3 [IU] via SUBCUTANEOUS
  Administered 2017-07-18: 2 [IU] via SUBCUTANEOUS
  Administered 2017-07-19: 1 [IU] via SUBCUTANEOUS

## 2017-07-17 MED ORDER — ZOLPIDEM TARTRATE 5 MG PO TABS: 5.0000 mg | ORAL_TABLET | Freq: Every evening | ORAL | Status: DC | PRN

## 2017-07-17 MED ORDER — CLOTRIMAZOLE 10 MG MT TROC
10.0000 mg | Freq: Every day | OROMUCOSAL | Status: DC
  Administered 2017-07-17 – 2017-07-18 (×4): 10 mg via ORAL
  Filled 2017-07-17 (×28): qty 1

## 2017-07-17 MED ORDER — LORAZEPAM 0.5 MG PO TABS: 0.5000 mg | ORAL_TABLET | Freq: Four times a day (QID) | ORAL | Status: DC | PRN

## 2017-07-17 MED ORDER — IPRATROPIUM BROMIDE 0.02 % IN SOLN
0.5000 mg | RESPIRATORY_TRACT | Status: DC
  Administered 2017-07-17: 0.5 mg via RESPIRATORY_TRACT
  Filled 2017-07-17: qty 2.5

## 2017-07-17 NOTE — ED Notes (Signed)
Bed: WA25 Expected date:  Expected time:  Means of arrival:  Comments: [place]ane c getting leg bag and dressed in room

## 2017-07-17 NOTE — H&P (Signed)
History and Physical    Maurice Tyler ZTI:[id number] DOB: [birth year] DOA: 07/17/2017  Referring MD/NP/PA:   PCP: [name], MD   Patient coming from:  The patient is coming from SNF.  At baseline, pt is dependent for most of ADL.   Chief Complaint: SOB  HPI: Maurice Tyler is a 77 y.o. 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  Patient states that he has been having SOB in the past 3 days, which has been progressively getting worse. Patient denies cough chest pain, fever or chills. Per report from our facility, patient had hypotension (no detail number of Bp was given). His blood pressure is 104/51 in ED.  Patient denies nausea, vomiting, diarrhea, abdominal pain, symptoms of UTI or unilateral weakness. Patient just completed course of a Bactrim in facility.  ED Course: pt was found to have WBC 11.1, electrolytes and renal function okay, temperature normal, no tachycardia, oxygen saturation 83% on room which improved to 98% on 5l  Nasal cannula oxygen.Patient is placed on telemetry bed for observation. Oncology, Dr. Lebron Conners was consulted.  CXR showed 1. Subtotal opacification of the left hemithorax now, with progressive opacification of the small area of residual pneumatized lung since the recent restaging CT 07/14/2017. Given the recent CT appearance, this might reflect a combination of progressive left bronchial compression due to tumor and/or mucous plugging. 2. Stable left pleural drain. 3. Hyperinflated right lung.  Review of Systems:   General: no fevers, chills, no body weight gain, has poor appetite, has fatigue HEENT: no blurry vision, hearing changes or sore throat Respiratory: has dyspnea, no coughing, wheezing CV: no chest pain, no palpitations GI: no nausea, vomiting, abdominal pain, diarrhea,  constipation GU: no dysuria, burning on urination, increased urinary frequency, hematuria  Ext: no leg edema Neuro: no unilateral weakness, numbness, or tingling, no vision change or hearing loss Skin: no rash, no skin tear. MSK: No muscle spasm, no deformity, no limitation of range of movement in spin Heme: No easy bruising.  Travel history: No recent long distant travel.  Allergy: No Known Allergies  Past Medical History:  Diagnosis Date  . Diabetes mellitus without complication ([place])   . Malignant neoplasm of unspecified part of unspecified bronchus or lung ([place]) 03/30/2017    Past Surgical History:  Procedure Laterality Date  . IR GUIDED DRAIN W CATHETER PLACEMENT  05/19/2017  . IR THORACENTESIS ASP PLEURAL SPACE W/IMG GUIDE  03/16/2017  . VIDEO BRONCHOSCOPY Bilateral 03/25/2017   Procedure: VIDEO BRONCHOSCOPY WITH FLUORO;  Surgeon: Juanito Doom, MD;  Location: [facility];  Service: Cardiopulmonary;  Laterality: Bilateral;    Social History:  reports that he quit smoking about 7 months ago. His smoking use included cigarettes. He has a 58.00 pack-year smoking history. he has never used smokeless tobacco. He reports that he does not drink alcohol or use drugs.  Family History:  Family History  Problem Relation Age of Onset  . Cancer Mother   . Diabetes Neg Hx   . Stroke Neg Hx   . Heart disease Neg Hx      Prior to Admission medications   Medication Sig Start Date End Date Taking? Authorizing Provider  acetaminophen (TYLENOL) 500 MG tablet Take 1,000 mg by mouth every 6 (six) hours as needed for moderate pain.   Yes [provider]  amiodarone (PACERONE) 400 MG tablet Take 1  tablet (400 mg total) by mouth 2 (two) times daily. 05/20/17  Yes Aline August, MD  cetirizine (ZYRTEC) 10 MG tablet Take 10 mg daily by mouth. ALLERTEC   Yes [provider]  clotrimazole (MYCELEX) 10 MG troche Take 10 mg by mouth 5 (five) times daily. 07/14/17 07/21/17 Yes  [provider]  furosemide (LASIX) 20 MG tablet Take 1 tablet (20 mg total) by mouth daily. 05/20/17  Yes Aline August, MD  guaiFENesin (ROBITUSSIN) 100 MG/5ML SOLN Take 10 mLs by mouth every 4 (four) hours as needed for cough or to loosen phlegm.   Yes [provider]  menthol-cetylpyridinium (CEPACOL) 3 MG lozenge Take 1 lozenge by mouth every 6 (six) hours as needed for sore throat.   Yes [provider]  metFORMIN (GLUCOPHAGE) 500 MG tablet Take 500 mg 2 (two) times daily with a meal by mouth.    Yes [provider]  potassium chloride 20 MEQ/15ML (10%) SOLN Take 22.5 mLs (30 mEq total) by mouth daily. 05/20/17  Yes Aline August, MD  prochlorperazine (COMPAZINE) 10 MG tablet Take 1 tablet (10 mg total) by mouth every 6 (six) hours as needed for nausea or vomiting. 04/24/17  Yes Kyung Rudd, MD  sulfamethoxazole-trimethoprim (BACTRIM DS,SEPTRA DS) 800-160 MG tablet Take 1 tablet by mouth 2 (two) times daily.   Yes [provider]  tamsulosin (FLOMAX) 0.4 MG CAPS capsule Take 0.4 mg by mouth daily.   Yes [provider]  ACCU-CHEK FASTCLIX LANCETS [place]  03/19/17   [provider]  ACCU-CHEK GUIDE test strip USE TO CHECK BLOOD SUGAR BID 03/23/17   [provider]  albuterol (PROVENTIL HFA;VENTOLIN HFA) 108 (90 Base) MCG/ACT inhaler Inhale 1 puff every 6 (six) hours as needed into the lungs for wheezing or shortness of breath.    [provider]  loperamide (IMODIUM A-D) 2 MG tablet Take 2 mg by mouth 3 (three) times daily as needed for diarrhea or loose stools.    [provider]  LORazepam (ATIVAN) 0.5 MG tablet Take 1 tablet (0.5 mg total) by mouth every 6 (six) hours as needed for anxiety. 05/20/17   Aline August, MD  ondansetron (ZOFRAN-ODT) 4 MG disintegrating tablet Take 4 mg by mouth every 8 (eight) hours as needed for nausea or vomiting.    [provider]    Physical Exam: Vitals:    07/17/17 2000 07/17/17 2030 07/17/17 2100 07/17/17 2130  BP: (!) 103/47 (!) 111/50 (!) 103/50 (!) 107/51  Pulse: 84 83 81 82  Resp: (!) 26 (!) 23 (!) 23 (!) 26  Temp:      TempSrc:      SpO2: 91% 96% 97% 96%  Weight:      Height:       General: Not in acute distress. Cachectic looking. HEENT:       Eyes: PERRL, EOMI, no scleral icterus.       ENT: No discharge from the ears and nose, no pharynx injection, no tonsillar enlargement.        Neck: No JVD, no bruit, no mass felt. Heme: No neck lymph node enlargement. Cardiac: S1/S2, RRR, No murmurs, No gallops or rubs. Respiratory: decreased air movement on the left. No rales, wheezing, rhonchi or rubs. GI: Soft, nondistended, nontender, no rebound pain, no organomegaly, BS present. GU: No hematuria Ext: No pitting leg edema bilaterally. 2+DP/PT pulse bilaterally. Musculoskeletal: No joint deformities, No joint redness or warmth, no limitation of ROM in spin. Skin: No rashes.  Neuro: Alert, oriented X3, cranial nerves II-XII grossly intact, moves all extremities normally. Psych: Patient is not psychotic, no suicidal or hemocidal ideation.  Labs on Admission: I have personally reviewed following labs and imaging studies  CBC: Recent Labs  Lab 07/17/17 1750  WBC 11.1*  NEUTROABS 9.8*  HGB 10.0*  HCT 33.9*  MCV 91.9  PLT 017   Basic Metabolic Panel: Recent Labs  Lab 07/17/17 1750  NA 139  K 4.4  CL 102  CO2 33*  GLUCOSE 124*  BUN 24*  CREATININE 0.64  CALCIUM 8.6*   GFR: Estimated Creatinine Clearance: 66.9 mL/min (by C-G formula based on SCr of 0.64 mg/dL). Liver Function Tests: Recent Labs  Lab 07/17/17 1750  AST 15  ALT 20  ALKPHOS 129*  BILITOT 0.4  PROT 5.6*  ALBUMIN 2.3*   Recent Labs  Lab 07/17/17 1750  LIPASE 18   No results for input(s): AMMONIA in the last 168 hours. Coagulation Profile: No results for input(s): INR, PROTIME in the last 168 hours. Cardiac Enzymes: No results for input(s):  CKTOTAL, CKMB, CKMBINDEX, TROPONINI in the last 168 hours. BNP (last 3 results) No results for input(s): PROBNP in the last 8760 hours. HbA1C: No results for input(s): HGBA1C in the last 72 hours. CBG: No results for input(s): GLUCAP in the last 168 hours. Lipid Profile: No results for input(s): CHOL, HDL, LDLCALC, TRIG, CHOLHDL, LDLDIRECT in the last 72 hours. Thyroid Function Tests: No results for input(s): TSH, T4TOTAL, FREET4, T3FREE, THYROIDAB in the last 72 hours. Anemia Panel: No results for input(s): VITAMINB12, FOLATE, FERRITIN, TIBC, IRON, RETICCTPCT in the last 72 hours. Urine analysis:    Component Value Date/Time   COLORURINE AMBER (A) 04/26/2017 0311   APPEARANCEUR HAZY (A) 04/26/2017 0311   LABSPEC >1.046 (H) 04/26/2017 0311   PHURINE 5.0 04/26/2017 0311   GLUCOSEU 50 (A) 04/26/2017 0311   HGBUR NEGATIVE 04/26/2017 0311   BILIRUBINUR NEGATIVE 04/26/2017 0311   KETONESUR 5 (A) 04/26/2017 0311   PROTEINUR 30 (A) 04/26/2017 0311   NITRITE NEGATIVE 04/26/2017 0311   LEUKOCYTESUR NEGATIVE 04/26/2017 0311   Sepsis Labs: @LABRCNTIP (procalcitonin:4,lacticidven:4) )No results found for this or any previous visit (from the past 240 hour(s)).   Radiological Exams on Admission: Dg Chest Port 1 View  Result Date: 07/17/2017 CLINICAL DATA:  77 year old male with stage IV lung cancer diagnosed November 2018. Shortness of breath. EXAM: PORTABLE CHEST 1 VIEW COMPARISON:  Restaging CT chest abdomen and pelvis 07/14/2017 and earlier. FINDINGS: Two portable AP semi upright views at 1745 hours. Progressive opacification of the left hemithorax since 05/21/2017, although the left lung ventilation appears only mildly worsened since 07/14/2017. There is subtotal opacification of the left hemithorax now. A percutaneous pleural catheter remains in place. Leftward shift of the mediastinum may have increased along with the opacification of the residual left perihilar lung since 07/14/2017. The  left mainstem bronchus is poorly visible. The right lung appears hyperinflated and clear. Visualized tracheal air column is within normal limits. IMPRESSION: 1. Subtotal opacification of the left hemithorax now, with progressive opacification of the small area of residual pneumatized lung since the recent restaging CT 07/14/2017. Given the recent CT appearance, this might reflect a combination of progressive left bronchial compression due to tumor and/or mucous plugging. 2. Stable left pleural drain. 3. Hyperinflated right lung. Electronically Signed   By: Genevie Ann M.D.   On: 07/17/2017 18:32     EKG:  Not done in ED, will get one.   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 Pt has mild leukocytosis with WBC 11.1, but no fever, no tachycardia, clinically nonseptic. Will hold off on antibiotics. Oncology, Dr. Lebron Conners was consulted by EDP.  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 F/u with Dr. Cordella Register and NP, [place]. Per clinic note, pt is supposed to be on Keytrude only, but it is no on his med list. This needs to be  clarified with oncologist see pt for consultation. -f/u ophthalmology oncologist's recommendation. -palliative consult  Diabetes mellitus type 2 in nonobese [facility]): Last A1c 7.4 on 1/81/19, not well controled. Patient is taking metforminat home -SSI  Atrial Fibrillation: CHA2DS2-VASc Score is 3, needs oral anticoagulation, but pt is not on AC, possibly due to end stage of life, and high risk of fall. Heart rate is well controlled. -will continue amiodarone   Hypotension: patient was reported to have hypotension in facility. His blood pressure is normal now. -Hold Lasix Check cortisol level -NS at 75 cc/h  DVT ppx: SQ Lovenox Code Status: Full code (I discussed with patient about code status, he is not ready to make final decision now. Will be full code now). Family Communication: None at bed side.   Disposition Plan:  Anticipate discharge back to previous SNF Consults called:  Oncology, Dr. Almira Bar Admission status: Obs / tele    Date of Service 07/17/2017    Ivor Costa [facility] Pager [phone]  If 7PM-7AM, please contact night-coverage [email or web address] Password [email or web address] 07/17/2017, 9:46 PM

## 2017-07-17 NOTE — ED Notes (Signed)
Hospitalist at bedside 

## 2017-07-17 NOTE — Telephone Encounter (Signed)
dtr wil come to appt 3/21

## 2017-07-17 NOTE — ED Triage Notes (Addendum)
x3 days. Worse today. Drain has been working properly. 150cc of fluid off. 85% on 5L . 98% on nonrebreather

## 2017-07-17 NOTE — Progress Notes (Signed)
Location:  [facility] Room Number: 644-I Place of Service:  SNF (31) Provider:  Durenda Age, NP  Patient Care Team: [name], MD as PCP - General (Internal Medicine) Minus Breeding, MD as PCP - Cardiology (Cardiology)  Extended Emergency Contact Information Primary Emergency Contact: Ahlberg,Corinne Address: [address]          [place], [place] [address] Johnnette Litter of [place] Phone: [phone] Relation: Spouse Secondary Emergency Contact: Prudy Feeler Mobile Phone: [phone] Relation: Daughter Preferred language: English Interpreter needed? No  Code Status:  Full Code  Goals of care: Advanced Directive information Advanced Directives 06/24/2017  Does Patient Have a Medical Advance Directive? No  Type of Advance Directive -  Would patient like information on creating a medical advance directive? -     Chief Complaint  Patient presents with  . Acute Visit    Patient with an O2 saturation of 83 on 3 liters oxygen.    HPI:  Pt is a 77 y.o. male seen today for an acute visit secondary to an oxygen saturation of 83% on 3 liters O2. His O2 sat would go up to 85 and then would go down to 83%. His pleurx catheter was drained 150 ml today. Chest x-ray was done and revealed no acute pulmonary infiltrate and consistent with prior left pneumonectomy. He follows up with oncology for palliative systemic chemotherapy. His BPs has been low and he is currently taking Amiodarone 400 mg BID for new onset a-fib.. Metoprolol was recently discontinued due to hypotension. He is a short-term rehabilitation resident of [facility].  He has a PMH of DM without complication, malignant neoplasm of the lung, AKI, and empyema of the lung.     Past Medical History:  Diagnosis Date  . Diabetes mellitus without complication ([place])   . Malignant neoplasm of unspecified part of unspecified bronchus or lung ([place]) 03/30/2017   Past  Surgical History:  Procedure Laterality Date  . IR GUIDED DRAIN W CATHETER PLACEMENT  05/19/2017  . IR THORACENTESIS ASP PLEURAL SPACE W/IMG GUIDE  03/16/2017  . VIDEO BRONCHOSCOPY Bilateral 03/25/2017   Procedure: VIDEO BRONCHOSCOPY WITH FLUORO;  Surgeon: Juanito Doom, MD;  Location: [facility];  Service: Cardiopulmonary;  Laterality: Bilateral;    No Known Allergies  Outpatient Encounter Medications as of 07/17/2017  Medication Sig  . acetaminophen (TYLENOL) 500 MG tablet Take 1,000 mg by mouth every 6 (six) hours as needed for moderate pain.  Marland Kitchen albuterol (PROVENTIL HFA;VENTOLIN HFA) 108 (90 Base) MCG/ACT inhaler Inhale 1 puff every 6 (six) hours as needed into the lungs for wheezing or shortness of breath.  Marland Kitchen amiodarone (PACERONE) 400 MG tablet Take 1 tablet (400 mg total) by mouth 2 (two) times daily.  . cetirizine (ZYRTEC) 10 MG tablet Take 10 mg daily by mouth. ALLERTEC  . clotrimazole (MYCELEX) 10 MG troche Take 10 mg by mouth 5 (five) times daily.  . furosemide (LASIX) 20 MG tablet Take 1 tablet (20 mg total) by mouth daily.  Marland Kitchen guaiFENesin (ROBITUSSIN) 100 MG/5ML SOLN Take 10 mLs by mouth every 4 (four) hours as needed for cough or to loosen phlegm.  . loperamide (IMODIUM A-D) 2 MG tablet Take 2 mg by mouth 3 (three) times daily as needed for diarrhea or loose stools.  Marland Kitchen LORazepam (ATIVAN) 0.5 MG tablet Take 1 tablet (0.5 mg total) by mouth every 6 (six) hours as needed for anxiety.  . metFORMIN (GLUCOPHAGE) 500 MG tablet Take 500 mg 2 (two)  times daily with a meal by mouth.   . ondansetron (ZOFRAN-ODT) 4 MG disintegrating tablet Take 4 mg by mouth every 8 (eight) hours as needed for nausea or vomiting.  . potassium chloride 20 MEQ/15ML (10%) SOLN Take 22.5 mLs (30 mEq total) by mouth daily.  . prochlorperazine (COMPAZINE) 10 MG tablet Take 1 tablet (10 mg total) by mouth every 6 (six) hours as needed for nausea or vomiting.  . Pseudoephedrine-Acetaminophen (CEPACOL SORE THROAT  PO) Take 1 lozenge by mouth every 6 (six) hours as needed (Sore throat).  . tamsulosin (FLOMAX) 0.4 MG CAPS capsule Take 0.4 mg by mouth daily.  Marland Kitchen ACCU-CHEK FASTCLIX LANCETS MISC   . ACCU-CHEK GUIDE test strip USE TO CHECK BLOOD SUGAR BID   No facility-administered encounter medications on file as of 07/17/2017.     Review of Systems  GENERAL: No change in appetite, no fatigue, no weight changes, no fever, chills or weakness MOUTH and THROAT: Denies oral discomfort, gingival pain or bleeding RESPIRATORY: +SOB CARDIAC: No chest pain, edema or palpitations GI: No abdominal pain, diarrhea, constipation, heart burn PSYCHIATRIC: Denies feelings of depression or anxiety. No report of hallucinations, insomnia, paranoia, or agitation   Immunization History  Administered Date(s) Administered  . Influenza Split 02/08/2016, 02/10/2017  . Influenza,inj,Quad PF,6+ Mos 02/28/2014   Pertinent  Health Maintenance Due  Topic Date Due  . FOOT EXAM  08/05/1950  . OPHTHALMOLOGY EXAM  08/05/1950  . URINE MICROALBUMIN  08/05/1950  . PNA vac Low Risk Adult (1 of 2 - PCV13) 07/18/2018 (Originally 08/05/2004)  . HEMOGLOBIN A1C  11/19/2017  . INFLUENZA VACCINE  Completed   Fall Risk  06/24/2017  Falls in the past year? No      Vitals:   07/17/17 1548  BP: (!) 108/58  Pulse: 86  Resp: (!) 22  Temp: 98.1 F (36.7 C)  TempSrc: Oral  SpO2: (!) 83%  Weight: 134 lb 9.6 oz (61.1 kg)  Height: 5\' 11"  (1.803 m)   Body mass index is 18.77 kg/m.  Physical Exam  GENERAL APPEARANCE: Cachectic SKIN:  Sacrum pressure ulcer with yellow slough MOUTH and THROAT: Lips are without lesions. Oral mucosa is moist and without lesions.  RESPIRATORY: breath sounds are distant, no wheezing, has pleurx catheter intact CARDIAC: RRR, no murmur,no extra heart sounds, no edema EXTREMITIES:  Able to move X 4 extremities PSYCHIATRIC: Alert and oriented X 3. Affect and behavior are appropriate   Labs reviewed: Recent  Labs    05/06/17 0316 05/07/17 0411 05/08/17 0422  05/11/17 0451  05/19/17 0455 05/20/17 0350 07/03/17 07/07/17 0931  NA 137  --  139   < > 138   < > 135 134* 138 139  K 4.1  --  3.6   < > 2.6*   < > 3.7 3.5 4.6 4.8  CL 102  --  98*   < > 91*   < > 95* 95*  --  101  CO2 28  --  35*   < > 39*   < > 31 30  --  27  GLUCOSE 178*  --  139*   < > 220*   < > 161* 185*  --  115  BUN 19  --  18   < > 16   < > 18 22* 19 18  CREATININE 0.38*  --  0.36*   < > 0.39*   < > 0.48* 0.50* 0.5* 0.62*  CALCIUM 7.8*  --  8.1*   < >  7.8*   < > 8.0* 8.0*  --  9.6  MG 1.8 1.8 1.8   < > 1.9  --  2.0 2.0  --   --   PHOS 2.6 3.0 2.7  --   --   --   --   --   --   --    < > = values in this interval not displayed.   Recent Labs    05/09/17 0730 05/10/17 0458 05/18/17 0442 07/07/17 0931  AST 20  --  31 10  ALT 21  --  75* 59*  ALKPHOS 94  --  104 289*  BILITOT 0.6  --  0.4 0.6  PROT 4.2*  --  5.5* 6.1*  ALBUMIN 1.6* 1.8* 2.1* 2.4*   Recent Labs    05/19/17 0455 05/20/17 0350 07/03/17 07/07/17 0931  WBC 6.4 6.0 8.6 7.9  NEUTROABS 5.1 4.8 7 6.7*  HGB 10.4* 10.1* 9.5* 10.7*  HCT 32.3* 31.7* 29* 33.8*  MCV 93.4 93.5  --  87.3  PLT 306 324 279 287   Lab Results  Component Value Date   TSH 3.597 07/07/2017   Lab Results  Component Value Date   HGBA1C 7.4 05/22/2017   Significant Diagnostic Results in last 30 days:  Ct Chest W Contrast  Result Date: 07/14/2017 CLINICAL DATA:  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 Patient underwent first cycle of palliative chemotherapy 04/15/2017 with subsequent hospitalization for multilobar pneumonia. Patient presents for baseline restaging prior to resuming chemotherapy. EXAM: CT CHEST, ABDOMEN, AND PELVIS WITH CONTRAST TECHNIQUE: Multidetector CT imaging of the chest, abdomen and pelvis was performed following the standard protocol during bolus administration of intravenous contrast. CONTRAST:  39mL ISOVUE-300 IOPAMIDOL  (ISOVUE-300) INJECTION 61% COMPARISON:  05/06/2017 unenhanced chest CT. 04/30/2017 IV contrast-enhanced chest CT. 03/23/2017 PET-CT. FINDINGS: CT CHEST FINDINGS Cardiovascular: Normal heart size. Stable small pericardial effusion/thickening. Atherosclerotic nonaneurysmal thoracic aorta. Normal caliber main pulmonary artery. No central pulmonary emboli. Mediastinum/Nodes: No discrete thyroid nodules. The upper thoracic esophagus is newly mildly dilated and filled with oral contrast and debris, with an apparent esophageal caliber transition at the level of the infiltrative central left lung tumor, which appears to encase/invade the thoracic esophagus (series 2/image 34). No axillary adenopathy. No right hilar adenopathy. No discrete pathologically enlarged mediastinal nodes. Lungs/Pleura: There is prominent volume loss in the left hemithorax. There is a moderate left hydropneumothorax with circumferential left pleural thickening and enhancement, with basilar left PleurX catheter well positioned with the tip in posterior mid to upper left pleural space. The left hydropneumothorax is overall significantly decreased in size since 05/06/2017 chest CT. Infiltrative poorly marginated partially necrotic heterogeneously enhancing central left lung mass measures approximately 10.0 x 6.4 cm (series 2/image 36), overall not appreciably changed in size from 04/30/2017 chest CT, where it measured 10.9 x 6.6 cm. The central left lung mass directly invades the left hilum and subcarinal mediastinum. Occlusion of the left upper and left lower lobe bronchi by the mass, with complete left lower lobe atelectasis and limited aeration of the left upper lobe, with bandlike opacities throughout the largely collapsed left upper lobe, compatible with a combination of atelectasis and postinfectious scarring. The necrotic left lower lobe portion of the tumor appears to directly communicate with the left pleural space (series 2/images 41 and 42).  No right pleural effusion. Moderate centrilobular and paraseptal emphysema with diffuse bronchial wall thickening. Stable calcified subcentimeter medial right lower lobe granuloma. No acute consolidative airspace disease or  significant pulmonary nodules in the right lung. Musculoskeletal:  No aggressive appearing focal osseous lesions. CT ABDOMEN PELVIS FINDINGS Hepatobiliary: Normal liver with no liver mass. Cholelithiasis. Stable nonspecific wall thickening in the fundal portion of the gallbladder. No pericholecystic fluid. No biliary ductal dilatation. Pancreas: Normal, with no mass or duct dilation. Spleen: Normal size. No mass. Adrenals/Urinary Tract: No discrete adrenal nodules. No hydronephrosis. Simple 2.4 cm lateral lower right renal cyst. Several subcentimeter hypodense renal cortical lesions in both kidneys are too small to characterize. Bladder is completely collapsed by indwelling Foley catheter. New 18 x 10 mm calcification at the right posterior bladder, presumably a bladder stone. Stomach/Bowel: Normal non-distended stomach. Normal caliber small bowel with no small bowel wall thickening. Normal appendix. Mild sigmoid diverticulosis, with no large bowel wall thickening or pericolonic fat stranding. Vascular/Lymphatic: Atherosclerotic abdominal aorta with stable 3.0 cm infrarenal abdominal aortic aneurysm. Patent portal, splenic, hepatic and renal veins. No pathologically enlarged lymph nodes in the abdomen or pelvis. Reproductive: Stable moderately enlarged prostate. Other: No pneumoperitoneum, ascites or focal fluid collection. Musculoskeletal: No aggressive appearing focal osseous lesions. 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 IMPRESSION: 1. Infiltrative partially necrotic central left lung tumor is overall stable in size since 04/30/2017 chest CT. Persistent occlusion of the central left lung airways with complete left lower lobe atelectasis and near complete left upper lobe  scarring/atelectasis. 2. Moderate left hydropneumothorax is significantly decreased in size since 05/06/2017 chest CT with left PleurX catheter in place. Probable direct communication of the necrotic left lower lobe portion of the tumor with the left pleural space. 3. Circumferential left pleural thickening and enhancement compatible with malignant effusion. 4. Findings worrisome for developing malignant stricture in the midthoracic esophagus, with new dilatation of the upper thoracic esophagus, which is filled with oral contrast and debris. Patient is at high risk of aspiration. 5. Otherwise no evidence of new or progressive metastatic disease in the chest. No evidence of metastatic disease in the abdomen, pelvis or skeleton. 6. Stable small pericardial effusion/thickening. 7. New 18 x 10 mm right posterior bladder calcification adjacent to the indwelling Foley catheter, presumably a bladder stone. No hydronephrosis. 8. Aortic Atherosclerosis (ICD10-I70.0) and Emphysema (ICD10-J43.9). Numerous chronic findings as above. These results will be called to the ordering clinician or representative by the Radiologist Assistant, and communication documented in the PACS or zVision Dashboard. Electronically Signed   By: Ilona Sorrel M.D.   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 Patient underwent first cycle of palliative chemotherapy 04/15/2017 with subsequent hospitalization for multilobar pneumonia. Patient presents for baseline restaging prior to resuming chemotherapy. EXAM: CT CHEST, ABDOMEN, AND PELVIS WITH CONTRAST TECHNIQUE: Multidetector CT imaging of the chest, abdomen and pelvis was performed following the standard protocol during bolus administration of intravenous contrast. CONTRAST:  88mL ISOVUE-300 IOPAMIDOL (ISOVUE-300) INJECTION 61% COMPARISON:  05/06/2017 unenhanced chest CT.  04/30/2017 IV contrast-enhanced chest CT. 03/23/2017 PET-CT. FINDINGS: CT CHEST FINDINGS Cardiovascular: Normal heart size. Stable small pericardial effusion/thickening. Atherosclerotic nonaneurysmal thoracic aorta. Normal caliber main pulmonary artery. No central pulmonary emboli. Mediastinum/Nodes: No discrete thyroid nodules. The upper thoracic esophagus is newly mildly dilated and filled with oral contrast and debris, with an apparent esophageal caliber transition at the level of the infiltrative central left lung tumor, which appears to encase/invade the thoracic esophagus (series 2/image 34). No axillary adenopathy. No right hilar adenopathy. No discrete pathologically  enlarged mediastinal nodes. Lungs/Pleura: There is prominent volume loss in the left hemithorax. There is a moderate left hydropneumothorax with circumferential left pleural thickening and enhancement, with basilar left PleurX catheter well positioned with the tip in posterior mid to upper left pleural space. The left hydropneumothorax is overall significantly decreased in size since 05/06/2017 chest CT. Infiltrative poorly marginated partially necrotic heterogeneously enhancing central left lung mass measures approximately 10.0 x 6.4 cm (series 2/image 36), overall not appreciably changed in size from 04/30/2017 chest CT, where it measured 10.9 x 6.6 cm. The central left lung mass directly invades the left hilum and subcarinal mediastinum. Occlusion of the left upper and left lower lobe bronchi by the mass, with complete left lower lobe atelectasis and limited aeration of the left upper lobe, with bandlike opacities throughout the largely collapsed left upper lobe, compatible with a combination of atelectasis and postinfectious scarring. The necrotic left lower lobe portion of the tumor appears to directly communicate with the left pleural space (series 2/images 41 and 42). No right pleural effusion. Moderate centrilobular and paraseptal  emphysema with diffuse bronchial wall thickening. Stable calcified subcentimeter medial right lower lobe granuloma. No acute consolidative airspace disease or significant pulmonary nodules in the right lung. Musculoskeletal:  No aggressive appearing focal osseous lesions. CT ABDOMEN PELVIS FINDINGS Hepatobiliary: Normal liver with no liver mass. Cholelithiasis. Stable nonspecific wall thickening in the fundal portion of the gallbladder. No pericholecystic fluid. No biliary ductal dilatation. Pancreas: Normal, with no mass or duct dilation. Spleen: Normal size. No mass. Adrenals/Urinary Tract: No discrete adrenal nodules. No hydronephrosis. Simple 2.4 cm lateral lower right renal cyst. Several subcentimeter hypodense renal cortical lesions in both kidneys are too small to characterize. Bladder is completely collapsed by indwelling Foley catheter. New 18 x 10 mm calcification at the right posterior bladder, presumably a bladder stone. Stomach/Bowel: Normal non-distended stomach. Normal caliber small bowel with no small bowel wall thickening. Normal appendix. Mild sigmoid diverticulosis, with no large bowel wall thickening or pericolonic fat stranding. Vascular/Lymphatic: Atherosclerotic abdominal aorta with stable 3.0 cm infrarenal abdominal aortic aneurysm. Patent portal, splenic, hepatic and renal veins. No pathologically enlarged lymph nodes in the abdomen or pelvis. Reproductive: Stable moderately enlarged prostate. Other: No pneumoperitoneum, ascites or focal fluid collection. Musculoskeletal: No aggressive appearing focal osseous lesions. 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 IMPRESSION: 1. Infiltrative partially necrotic central left lung tumor is overall stable in size since 04/30/2017 chest CT. Persistent occlusion of the central left lung airways with complete left lower lobe atelectasis and near complete left upper lobe scarring/atelectasis. 2. Moderate left hydropneumothorax is  significantly decreased in size since 05/06/2017 chest CT with left PleurX catheter in place. Probable direct communication of the necrotic left lower lobe portion of the tumor with the left pleural space. 3. Circumferential left pleural thickening and enhancement compatible with malignant effusion. 4. Findings worrisome for developing malignant stricture in the midthoracic esophagus, with new dilatation of the upper thoracic esophagus, which is filled with oral contrast and debris. Patient is at high risk of aspiration. 5. Otherwise no evidence of new or progressive metastatic disease in the chest. No evidence of metastatic disease in the abdomen, pelvis or skeleton. 6. Stable small pericardial effusion/thickening. 7. New 18 x 10 mm right posterior bladder calcification adjacent to the indwelling Foley catheter, presumably a bladder stone. No hydronephrosis. 8. Aortic Atherosclerosis (ICD10-I70.0) and Emphysema (ICD10-J43.9). Numerous chronic findings as above. These results will be called to the ordering clinician or representative by the  Psychologist, clinical, and communication documented in the PACS or zVision Dashboard. Electronically Signed   By: Ilona Sorrel M.D.   On: 07/14/2017 15:19    Assessment/Plan  1. Acute respiratory failure with hypoxia (HCC) - O2 sat stays in the low 80s, continue O2 @ 3L/min continuously, will need to transfer to hospital for further evaluation   2. Hypotension due to drugs - BP 108/58, probably due to Amiodarone needed for A-fib, Metoprolol was recently discontinued due to hypotension   3. New onset a-fib (HCC) - rate-controlled, on Amiodarone 400 mg BID   4. Non-small cell cancer of left lung ([place]) - currently on Lasix 20 mg daily     Durenda Age, NP [facility] [phone] (Monday-Friday 8:00 a.m. - 5:00 p.m.) [phone] (after hours)

## 2017-07-17 NOTE — ED Notes (Signed)
ED TO INPATIENT HANDOFF REPORT  Name/Age/Gender Maurice Tyler 77 y.o. male  Code Status    Code Status Orders  (From admission, onward)        Start     Ordered   07/17/17 2048  Full code  Continuous     07/17/17 2048    Code Status History    Date Active Date Inactive Code Status Order ID Comments User Context   05/19/2017 09:50 05/20/2017 19:23 DNR [id number]  Aline August, MD Inpatient   04/25/2017 00:09 05/19/2017 09:50 Full Code [id number]  Rise Patience, MD ED      Home/SNF/Other Home     Chief Complaint Shortness of Breath  Level of Care/Admitting Diagnosis ED Disposition    ED Disposition Condition Comment   Admit  Hospital Area: [facility] [[id number]]  Level of Care: Telemetry [5]  Admit to tele based on following criteria: Other see comments  Comments: SOB  Diagnosis: Acute respiratory failure with hypoxia [facility]) [[id number]]  Admitting Physician: Ivor Costa [[id number]]  Attending Physician: Ivor Costa [[id number]]  PT Class (Do Not Modify): Observation [104]  PT Acc Code (Do Not Modify): Observation [10022]       Medical History Past Medical History:  Diagnosis Date  . Diabetes mellitus without complication ([place])   . Malignant neoplasm of unspecified part of unspecified bronchus or lung ([place]) 03/30/2017    Allergies No Known Allergies  IV Location/Drains/Wounds Patient Lines/Drains/Airways Status   Active Line/Drains/Airways    Name:   Placement date:   Placement time:   Site:   Days:   Peripheral IV 07/17/17 Left Hand   07/17/17    -    Hand   less than 1   Peripheral IV 07/17/17 Right Hand   07/17/17    2100    Hand   less than 1   Urethral Catheter amber melton , rn Non-latex 16 Fr.   05/13/17    1630    Non-latex   65          Labs/Imaging Results for orders placed or performed during the hospital encounter of 07/17/17 (from the past 48 hour(s))  Comprehensive metabolic panel     Status: Abnormal   Collection Time:  07/17/17  5:50 PM  Result Value Ref Range   Sodium 139 135 - 145 mmol/L   Potassium 4.4 3.5 - 5.1 mmol/L   Chloride 102 101 - 111 mmol/L   CO2 33 (H) 22 - 32 mmol/L   Glucose, Bld 124 (H) 65 - 99 mg/dL   BUN 24 (H) 6 - 20 mg/dL   Creatinine, Ser 0.64 0.61 - 1.24 mg/dL   Calcium 8.6 (L) 8.9 - 10.3 mg/dL   Total Protein 5.6 (L) 6.5 - 8.1 g/dL   Albumin 2.3 (L) 3.5 - 5.0 g/dL   AST 15 15 - 41 U/L   ALT 20 17 - 63 U/L   Alkaline Phosphatase 129 (H) 38 - 126 U/L   Total Bilirubin 0.4 0.3 - 1.2 mg/dL   GFR calc non Af Amer >60 >60 mL/min   GFR calc Af Amer >60 >60 mL/min    Comment: (NOTE) The eGFR has been calculated using the CKD EPI equation. This calculation has not been validated in all clinical situations. eGFR's persistently <60 mL/min signify possible Chronic Kidney Disease.    Anion gap 4 (L) 5 - 15    Comment: Performed at [facility], [place] [address] [place], [place] [address]  Lipase, blood  Status: None   Collection Time: 07/17/17  5:50 PM  Result Value Ref Range   Lipase 18 11 - 51 U/L    Comment: Performed at [facility], [place] [address], [place], [place] [address]  CBC with Differential/Platelet     Status: Abnormal   Collection Time: 07/17/17  5:50 PM  Result Value Ref Range   WBC 11.1 (H) 4.0 - 10.5 K/uL   RBC 3.69 (L) 4.22 - 5.81 MIL/uL   Hemoglobin 10.0 (L) 13.0 - 17.0 g/dL   HCT 33.9 (L) 39.0 - 52.0 %   MCV 91.9 78.0 - 100.0 fL   MCH 27.1 26.0 - 34.0 pg   MCHC 29.5 (L) 30.0 - 36.0 g/dL   RDW 16.0 (H) 11.5 - 15.5 %   Platelets 345 150 - 400 K/uL   Neutrophils Relative % 88 %   Neutro Abs 9.8 (H) 1.7 - 7.7 K/uL   Lymphocytes Relative 6 %   Lymphs Abs 0.6 (L) 0.7 - 4.0 K/uL   Monocytes Relative 4 %   Monocytes Absolute 0.5 0.1 - 1.0 K/uL   Eosinophils Relative 2 %   Eosinophils Absolute 0.2 0.0 - 0.7 K/uL   Basophils Relative 0 %   Basophils Absolute 0.0 0.0 - 0.1 K/uL    Comment: Performed at [facility], [place] [address], [place], [place] [address]  Blood gas, arterial ([facility] & [facility] ONLY)     Status: Abnormal   Collection Time: 07/17/17  8:05 PM  Result Value Ref Range   O2 Content 6.0 L/min   Delivery systems SIMPLE MASK    pH, Arterial 7.486 (H) 7.350 - 7.450   pCO2 arterial 39.0 32.0 - 48.0 mmHg   pO2, Arterial 123 (H) 83.0 - 108.0 mmHg   Bicarbonate 29.1 (H) 20.0 - 28.0 mmol/L   Acid-Base Excess 5.7 (H) 0.0 - 2.0 mmol/L   O2 Saturation 98.6 %   Patient temperature 98.6    Collection site LEFT RADIAL    Drawn by [id number]    Sample type ARTERIAL DRAW    Allens test (pass/fail) PASS PASS    Comment: Performed at [facility] [address] [place], [place] [address]   Dg Chest Port 1 View  Result Date: 07/17/2017 CLINICAL DATA:  77 year old male with stage IV lung cancer diagnosed November 2018. Shortness of breath. EXAM: PORTABLE CHEST 1 VIEW COMPARISON:  Restaging CT chest abdomen and pelvis 07/14/2017 and earlier. FINDINGS: Two portable AP semi upright views at 1745 hours. Progressive opacification of the left hemithorax since 05/21/2017, although the left lung ventilation appears only mildly worsened since 07/14/2017. There is subtotal opacification of the left hemithorax now. A percutaneous pleural catheter remains in place. Leftward shift of the mediastinum may have increased along with the opacification of the residual left perihilar lung since 07/14/2017. The left mainstem bronchus is poorly visible. The right lung appears hyperinflated and clear. Visualized tracheal air column is within normal limits. IMPRESSION: 1. Subtotal opacification of the left hemithorax now, with progressive opacification of the small area of residual pneumatized lung since the recent restaging CT 07/14/2017. Given the recent CT appearance, this might reflect a combination of progressive left bronchial compression due to tumor and/or mucous plugging. 2. Stable left pleural  drain. 3. Hyperinflated right lung. Electronically Signed   By: Genevie Ann M.D.   On: 07/17/2017 18:32    Pending Labs [employer] (From admission, onward)   Start     Ordered  07/17/17 2047  Strep pneumoniae urinary antigen  Once,   R     07/17/17 2048   07/17/17 2042  Culture, blood (x 2)  BLOOD CULTURE X 2,   STAT    Comments:  INITIATE ANTIBIOTICS WITHIN 1 HOUR AFTER BLOOD CULTURES DRAWN.  If unable to obtain blood cultures, call MD immediately regarding antibiotic instructions.    07/17/17 2042   07/17/17 2042  Lactic acid, plasma  STAT Now then every 3 hours,   STAT     07/17/17 2042   07/17/17 2042  Procalcitonin  STAT,   R     07/17/17 2042      Vitals/Pain Today's Vitals   07/17/17 1800 07/17/17 1800 07/17/17 1830 07/17/17 1900  BP: 101/60  (!) 103/47 (!) 104/51  Pulse: 80  82 83  Resp: 16  (!) 25 (!) 27  Temp:      TempSrc:      SpO2: 94% 95% 95% 98%  Weight:      Height:        Isolation Precautions No active isolations  Medications Medications  amiodarone (PACERONE) tablet 400 mg (not administered)  loratadine (CLARITIN) tablet 10 mg (not administered)  clotrimazole (MYCELEX) troche 10 mg (not administered)  guaiFENesin (ROBITUSSIN) 100 MG/5ML solution 200 mg (not administered)  loperamide (IMODIUM A-D) tablet 2 mg (not administered)  LORazepam (ATIVAN) tablet 0.5 mg (not administered)  menthol-cetylpyridinium (CEPACOL) lozenge 3 mg (not administered)  prochlorperazine (COMPAZINE) tablet 10 mg (not administered)  tamsulosin (FLOMAX) capsule 0.4 mg (not administered)  sulfamethoxazole-trimethoprim (BACTRIM DS,SEPTRA DS) 800-160 MG per tablet 1 tablet (not administered)  ipratropium (ATROVENT) nebulizer solution 0.5 mg (not administered)  albuterol (PROVENTIL) (2.5 MG/3ML) 0.083% nebulizer solution 2.5 mg (not administered)  insulin aspart (novoLOG) injection 0-9 Units (not administered)  insulin aspart (novoLOG) injection 0-5 Units (not administered)   enoxaparin (LOVENOX) injection 40 mg (not administered)  acetaminophen (TYLENOL) tablet 650 mg (not administered)  ondansetron (ZOFRAN) injection 4 mg (not administered)  zolpidem (AMBIEN) tablet 5 mg (not administered)    Mobility

## 2017-07-17 NOTE — ED Notes (Addendum)
Pt asked to be put on a mask again. Pt put on simple mask at 6 L/min. Sats 95%

## 2017-07-17 NOTE — ED Provider Notes (Addendum)
[facility] DEPT Provider Note   CSN: [id number] Arrival date & time: 07/17/17  1630     History   Chief Complaint Chief Complaint  Patient presents with  . Shortness of Breath    HPI Maurice Tyler is a 77 y.o. male.  Patient sent in from nursing facility.  Patient is known to have stage IV lung cancer predominantly involving the left lung area.  Has a pleural drain in that area as well.  Patient followed by hematology oncology here.  Patient last seen by them on March 5.  Followed by Dr. Mena Pauls.  Patient had CT chest abdomen and pelvis on 12 March.  Patient was seen by [facility] I think part of the nursing facility was noted to be hypotensive and noted to have oxygen saturations of 85% on 5 L.  Patient normally on 2-2.5 L of oxygen.  Patient states that he has worsening shortness of breath.  Patient was brought in by EMS on nonrebreather and he was satting at 98%.  Apparently the breathing situation is been worse for the past 3 days.  Patient's current treatment is Keytruda.  According to his hematology oncology notes he is receiving palliative chemotherapy.Marland Kitchen No DNR note.      Past Medical History:  Diagnosis Date  . Diabetes mellitus without complication ([place])   . Malignant neoplasm of unspecified part of unspecified bronchus or lung ([place]) 03/30/2017    Patient Active Problem List   Diagnosis Date Noted  . Encounter for antineoplastic immunotherapy 07/07/2017  . Chest tube in place   . Streptococcal pneumonia ([place])   . New onset a-fib ([place]) 04/30/2017  . AKI (acute kidney injury) ([place]) 04/30/2017  . Non-small cell cancer of left lung ([place])   . Goals of care, counseling/discussion   . Palliative care encounter   . Empyema lung ([place]) 04/29/2017  . Malnutrition of moderate degree 04/28/2017  . Pressure injury of skin 04/28/2017  . Diabetes mellitus type 2 in nonobese ([place]) 04/25/2017  . Acute respiratory failure with hypoxia  and hypercapnia ([place]) 04/25/2017  . Severe sepsis ([place]) 04/25/2017  . Multifocal pneumonia 04/25/2017  . Malignant neoplasm of bronchus of left upper lobe ([place]) 03/30/2017  . 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 IR GUIDED DRAIN W CATHETER PLACEMENT  05/19/2017  . IR THORACENTESIS ASP PLEURAL SPACE W/IMG GUIDE  03/16/2017  . VIDEO BRONCHOSCOPY Bilateral 03/25/2017   Procedure: VIDEO BRONCHOSCOPY WITH FLUORO;  Surgeon: Juanito Doom, MD;  Location: [facility];  Service: Cardiopulmonary;  Laterality: Bilateral;       Home Medications    Prior to Admission medications   Medication Sig Start Date End Date Taking? Authorizing Provider  acetaminophen (TYLENOL) 500 MG tablet Take 1,000 mg by mouth every 6 (six) hours as needed for moderate pain.   Yes [provider]  amiodarone (PACERONE) 400 MG tablet Take 1 tablet (400 mg total) by mouth 2 (two) times daily. 05/20/17  Yes Aline August, MD  cetirizine (ZYRTEC) 10 MG tablet Take 10 mg daily by mouth. ALLERTEC   Yes [provider]  clotrimazole (MYCELEX) 10 MG troche Take 10 mg by mouth 5 (five) times daily. 07/14/17 07/21/17 Yes [provider]  furosemide (LASIX) 20 MG tablet Take 1 tablet (20 mg total) by mouth daily. 05/20/17  Yes Aline August, MD  guaiFENesin (ROBITUSSIN) 100 MG/5ML SOLN Take 10 mLs by mouth  every 4 (four) hours as needed for cough or to loosen phlegm.   Yes [provider]  menthol-cetylpyridinium (CEPACOL) 3 MG lozenge Take 1 lozenge by mouth every 6 (six) hours as needed for sore throat.   Yes [provider]  metFORMIN (GLUCOPHAGE) 500 MG tablet Take 500 mg 2 (two) times daily with a meal by mouth.    Yes [provider]  potassium chloride 20 MEQ/15ML (10%) SOLN Take 22.5 mLs (30 mEq total) by mouth daily. 05/20/17  Yes Aline August, MD  prochlorperazine (COMPAZINE) 10 MG  tablet Take 1 tablet (10 mg total) by mouth every 6 (six) hours as needed for nausea or vomiting. 04/24/17  Yes Kyung Rudd, MD  sulfamethoxazole-trimethoprim (BACTRIM DS,SEPTRA DS) 800-160 MG tablet Take 1 tablet by mouth 2 (two) times daily.   Yes [provider]  tamsulosin (FLOMAX) 0.4 MG CAPS capsule Take 0.4 mg by mouth daily.   Yes [provider]  ACCU-CHEK FASTCLIX LANCETS [facility]  03/19/17   [provider]  ACCU-CHEK GUIDE test strip USE TO CHECK BLOOD SUGAR BID 03/23/17   [provider]  albuterol (PROVENTIL HFA;VENTOLIN HFA) 108 (90 Base) MCG/ACT inhaler Inhale 1 puff every 6 (six) hours as needed into the lungs for wheezing or shortness of breath.    [provider]  loperamide (IMODIUM A-D) 2 MG tablet Take 2 mg by mouth 3 (three) times daily as needed for diarrhea or loose stools.    [provider]  LORazepam (ATIVAN) 0.5 MG tablet Take 1 tablet (0.5 mg total) by mouth every 6 (six) hours as needed for anxiety. 05/20/17   Aline August, MD  ondansetron (ZOFRAN-ODT) 4 MG disintegrating tablet Take 4 mg by mouth every 8 (eight) hours as needed for nausea or vomiting.    [provider]    Family History Family History  Problem Relation Age of Onset  . Cancer Mother   . Diabetes Neg Hx   . Stroke Neg Hx   . Heart disease Neg Hx     Social History Social History   Tobacco Use  . Smoking status: Former Smoker    Packs/day: 1.00    Years: 58.00    Pack years: 58.00    Types: Cigarettes    Last attempt to quit: 11/21/2016    Years since quitting: 0.6  . Smokeless tobacco: Never Used  Substance Use Topics  . Alcohol use: No    Frequency: Never  . Drug use: No     Allergies   Patient has no known allergies.   Review of Systems Review of Systems  Constitutional: Positive for fatigue and unexpected weight change.  HENT: Positive for hearing loss.   Eyes: Negative for redness.  Respiratory: Positive for  shortness of breath.   Gastrointestinal: Negative for abdominal pain.  Musculoskeletal: Negative for back pain.  Allergic/Immunologic: Positive for immunocompromised state.  Neurological: Negative for headaches.  Hematological: Does not bruise/bleed easily.  Psychiatric/Behavioral: Negative for confusion.     Physical Exam Updated Vital Signs BP (!) 104/51   Pulse 83   Temp 98.3 F (36.8 C) (Oral)   Resp (!) 27   Ht 1.803 m (5\' 11" )   Wt 61.2 kg (135 lb)   SpO2 98%   BMI 18.83 kg/m   Physical Exam  Constitutional: He appears distressed.  HENT:  Mucous membranes slightly dry.  Eyes: Conjunctivae and EOM are normal. Pupils are equal, round, and reactive to light.  Neck: Normal range of motion.  Neck supple.  Cardiovascular: Normal rate, regular rhythm and normal heart sounds.  Pulmonary/Chest: He is in respiratory distress.  Decreased breath sounds on the left side.  Patient also has a pleural drain in place that is capped.  Abdominal: Soft. Bowel sounds are normal. There is no tenderness.  Musculoskeletal: Normal range of motion. He exhibits no edema.  Neurological: He is alert.  Nursing note and vitals reviewed.    ED Treatments / Results  Labs (all labs ordered are listed, but only abnormal results are displayed) Labs Reviewed  COMPREHENSIVE METABOLIC PANEL - Abnormal; Notable for the following components:      Result Value   CO2 33 (*)    Glucose, Bld 124 (*)    BUN 24 (*)    Calcium 8.6 (*)    Total Protein 5.6 (*)    Albumin 2.3 (*)    Alkaline Phosphatase 129 (*)    Anion gap 4 (*)    All other components within normal limits  CBC WITH DIFFERENTIAL/PLATELET - Abnormal; Notable for the following components:   WBC 11.1 (*)    RBC 3.69 (*)    Hemoglobin 10.0 (*)    HCT 33.9 (*)    MCHC 29.5 (*)    RDW 16.0 (*)    Neutro Abs 9.8 (*)    Lymphs Abs 0.6 (*)    All other components within normal limits  LIPASE, BLOOD    EKG  EKG Interpretation None         Radiology Dg Chest Port 1 View  Result Date: 07/17/2017 CLINICAL DATA:  77 year old male with stage IV lung cancer diagnosed November 2018. Shortness of breath. EXAM: PORTABLE CHEST 1 VIEW COMPARISON:  Restaging CT chest abdomen and pelvis 07/14/2017 and earlier. FINDINGS: Two portable AP semi upright views at 1745 hours. Progressive opacification of the left hemithorax since 05/21/2017, although the left lung ventilation appears only mildly worsened since 07/14/2017. There is subtotal opacification of the left hemithorax now. A percutaneous pleural catheter remains in place. Leftward shift of the mediastinum may have increased along with the opacification of the residual left perihilar lung since 07/14/2017. The left mainstem bronchus is poorly visible. The right lung appears hyperinflated and clear. Visualized tracheal air column is within normal limits. IMPRESSION: 1. Subtotal opacification of the left hemithorax now, with progressive opacification of the small area of residual pneumatized lung since the recent restaging CT 07/14/2017. Given the recent CT appearance, this might reflect a combination of progressive left bronchial compression due to tumor and/or mucous plugging. 2. Stable left pleural drain. 3. Hyperinflated right lung. Electronically Signed   By: Genevie Ann M.D.   On: 07/17/2017 18:32    Procedures Procedures (including critical care time)  Medications Ordered in ED Medications - No data to display   Initial Impression / Assessment and Plan / ED Course  I have reviewed the triage vital signs and the nursing notes.  Pertinent labs & imaging results that were available during my care of the patient were reviewed by me and considered in my medical decision making (see chart for details).    Upon arrival here patient was tried on 4 L of nasal cannula oxygen.  Initially oxygen sats were in the low 90s but then he would drop down.  So patient then needed 6+ liters of oxygen was  when she switched over to a mass not a nonrebreather and then his sats were up in the upper 90s he felt more comfortable however his respiratory  rate was still up.  Patient without any hypotension here lowest blood pressure systolic was 449 however we have not had patient up and about.  Heart rate was less than 100 no fevers.  Respiratory rate was up around 20 for the most part.  Discussed with on-call hematology oncology Dr. Lebron Conners he agreed with the admission for the respiratory difficulty he will consult on the patient in the morning.  He did request that I send him in a staff message so that will have the patient's name.  That was done through epic.  Discussed with hospitalist they will admit.  Arterial blood gas was done no evidence of any significant metabolic acidosis also patient's lactic acid was not elevated.  And no significant CO2 retention.  Hospitalist will admit.   Final Clinical Impressions(s) / ED Diagnoses   Final diagnoses:  Hypoxia  Malignant neoplasm of overlapping sites of left lung [facility])    ED Discharge Orders    None       Fredia Sorrow, MD 07/17/17 Jeananne Rama    Fredia Sorrow, MD 07/17/17 2143

## 2017-07-18 ENCOUNTER — Observation Stay (HOSPITAL_COMMUNITY): Payer: Medicare Other

## 2017-07-18 DIAGNOSIS — I959 Hypotension, unspecified: Secondary | ICD-10-CM | POA: Diagnosis present

## 2017-07-18 DIAGNOSIS — Z66 Do not resuscitate: Secondary | ICD-10-CM | POA: Diagnosis present

## 2017-07-18 DIAGNOSIS — J910 Malignant pleural effusion: Secondary | ICD-10-CM | POA: Diagnosis present

## 2017-07-18 DIAGNOSIS — J9601 Acute respiratory failure with hypoxia: Secondary | ICD-10-CM | POA: Diagnosis not present

## 2017-07-18 DIAGNOSIS — Z515 Encounter for palliative care: Secondary | ICD-10-CM | POA: Diagnosis present

## 2017-07-18 DIAGNOSIS — J9621 Acute and chronic respiratory failure with hypoxia: Secondary | ICD-10-CM | POA: Diagnosis present

## 2017-07-18 DIAGNOSIS — J440 Chronic obstructive pulmonary disease with acute lower respiratory infection: Secondary | ICD-10-CM | POA: Diagnosis present

## 2017-07-18 DIAGNOSIS — R0600 Dyspnea, unspecified: Secondary | ICD-10-CM | POA: Diagnosis not present

## 2017-07-18 DIAGNOSIS — C3412 Malignant neoplasm of upper lobe, left bronchus or lung: Secondary | ICD-10-CM | POA: Diagnosis not present

## 2017-07-18 DIAGNOSIS — C3492 Malignant neoplasm of unspecified part of left bronchus or lung: Secondary | ICD-10-CM | POA: Diagnosis not present

## 2017-07-18 DIAGNOSIS — Z79899 Other long term (current) drug therapy: Secondary | ICD-10-CM | POA: Diagnosis not present

## 2017-07-18 DIAGNOSIS — I482 Chronic atrial fibrillation: Secondary | ICD-10-CM

## 2017-07-18 DIAGNOSIS — Z7189 Other specified counseling: Secondary | ICD-10-CM | POA: Diagnosis not present

## 2017-07-18 DIAGNOSIS — Z681 Body mass index (BMI) 19 or less, adult: Secondary | ICD-10-CM | POA: Diagnosis not present

## 2017-07-18 DIAGNOSIS — J189 Pneumonia, unspecified organism: Secondary | ICD-10-CM

## 2017-07-18 DIAGNOSIS — Z87891 Personal history of nicotine dependence: Secondary | ICD-10-CM

## 2017-07-18 DIAGNOSIS — E440 Moderate protein-calorie malnutrition: Secondary | ICD-10-CM

## 2017-07-18 DIAGNOSIS — T380X5A Adverse effect of glucocorticoids and synthetic analogues, initial encounter: Secondary | ICD-10-CM | POA: Diagnosis not present

## 2017-07-18 DIAGNOSIS — Z9181 History of falling: Secondary | ICD-10-CM | POA: Diagnosis not present

## 2017-07-18 DIAGNOSIS — Z7984 Long term (current) use of oral hypoglycemic drugs: Secondary | ICD-10-CM | POA: Diagnosis not present

## 2017-07-18 DIAGNOSIS — C3482 Malignant neoplasm of overlapping sites of left bronchus and lung: Secondary | ICD-10-CM | POA: Diagnosis present

## 2017-07-18 DIAGNOSIS — Z9221 Personal history of antineoplastic chemotherapy: Secondary | ICD-10-CM | POA: Diagnosis not present

## 2017-07-18 DIAGNOSIS — R0902 Hypoxemia: Secondary | ICD-10-CM | POA: Diagnosis present

## 2017-07-18 DIAGNOSIS — E119 Type 2 diabetes mellitus without complications: Secondary | ICD-10-CM | POA: Diagnosis present

## 2017-07-18 DIAGNOSIS — J9600 Acute respiratory failure, unspecified whether with hypoxia or hypercapnia: Secondary | ICD-10-CM | POA: Diagnosis present

## 2017-07-18 DIAGNOSIS — J441 Chronic obstructive pulmonary disease with (acute) exacerbation: Secondary | ICD-10-CM | POA: Diagnosis present

## 2017-07-18 LAB — CORTISOL-AM, BLOOD: CORTISOL - AM: 26.5 ug/dL — AB (ref 6.7–22.6)

## 2017-07-18 LAB — BLOOD GAS, ARTERIAL
ACID-BASE EXCESS: 0.8 mmol/L (ref 0.0–2.0)
BICARBONATE: 25 mmol/L (ref 20.0–28.0)
Drawn by: 441261
O2 Content: 15 L/min
O2 SAT: 98.8 %
PATIENT TEMPERATURE: 98.6
PCO2 ART: 40.2 mmHg (ref 32.0–48.0)
PO2 ART: 162 mmHg — AB (ref 83.0–108.0)
pH, Arterial: 7.41 (ref 7.350–7.450)

## 2017-07-18 LAB — GLUCOSE, CAPILLARY
GLUCOSE-CAPILLARY: 136 mg/dL — AB (ref 65–99)
Glucose-Capillary: 163 mg/dL — ABNORMAL HIGH (ref 65–99)
Glucose-Capillary: 185 mg/dL — ABNORMAL HIGH (ref 65–99)
Glucose-Capillary: 177 mg/dL — ABNORMAL HIGH (ref 65–99)

## 2017-07-18 LAB — STREP PNEUMONIAE URINARY ANTIGEN: Strep Pneumo Urinary Antigen: NEGATIVE

## 2017-07-18 LAB — MRSA PCR SCREENING: MRSA by PCR: NEGATIVE

## 2017-07-18 LAB — LACTIC ACID, PLASMA: Lactic Acid, Venous: 1.3 mmol/L (ref 0.5–1.9)

## 2017-07-18 MED ORDER — GUAIFENESIN ER 600 MG PO TB12
1200.0000 mg | ORAL_TABLET | Freq: Two times a day (BID) | ORAL | Status: DC
  Administered 2017-07-18: 1200 mg via ORAL
  Filled 2017-07-18 (×3): qty 2

## 2017-07-18 MED ORDER — BUDESONIDE 0.5 MG/2ML IN SUSP
0.5000 mg | Freq: Two times a day (BID) | RESPIRATORY_TRACT | Status: DC
  Administered 2017-07-18 – 2017-07-20 (×3): 0.5 mg via RESPIRATORY_TRACT
  Filled 2017-07-18 (×5): qty 2

## 2017-07-18 MED ORDER — IPRATROPIUM-ALBUTEROL 0.5-2.5 (3) MG/3ML IN SOLN
3.0000 mL | Freq: Four times a day (QID) | RESPIRATORY_TRACT | Status: DC
  Administered 2017-07-18 – 2017-07-19 (×5): 3 mL via RESPIRATORY_TRACT
  Filled 2017-07-18 (×6): qty 3

## 2017-07-18 MED ORDER — METHYLPREDNISOLONE SODIUM SUCC 125 MG IJ SOLR
60.0000 mg | Freq: Two times a day (BID) | INTRAMUSCULAR | Status: DC
  Administered 2017-07-18 – 2017-07-22 (×8): 60 mg via INTRAVENOUS
  Filled 2017-07-18 (×9): qty 2

## 2017-07-18 MED ORDER — ARFORMOTEROL TARTRATE 15 MCG/2ML IN NEBU
15.0000 ug | INHALATION_SOLUTION | Freq: Two times a day (BID) | RESPIRATORY_TRACT | Status: DC
  Administered 2017-07-18 – 2017-07-19 (×3): 15 ug via RESPIRATORY_TRACT
  Filled 2017-07-18 (×11): qty 2

## 2017-07-18 MED ORDER — SODIUM CHLORIDE 3 % IN NEBU
4.0000 mL | INHALATION_SOLUTION | Freq: Two times a day (BID) | RESPIRATORY_TRACT | Status: DC
  Administered 2017-07-18: 4 mL via RESPIRATORY_TRACT
  Filled 2017-07-18 (×6): qty 4

## 2017-07-18 NOTE — Consult Note (Signed)
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  HPI:  Maurice Tyler is a 78 y.o. 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  Treatment was complicated by pneumonia resulting in prolonged hospitalization.  Patient was planned to receive the second cycle of therapy with pembrolizumab alone on 07/15/17, but medication was not given yet.  Patient was taken to the emergency room from his nursing facility yesterday due to increasing shortness of breath, hypotension, and hypoxia with oxygen saturation of 85% on 5 L by nasal cannula.  During ambulance transfer, patient has been placed on a mask and oxygen saturations rose up to 98%.  Chest x-ray obtained during ER visit demonstrates complete opacification of the left hemithorax, most recent CT scan obtained on 07/14/17 demonstrated persistent left pleural effusion despite presence of the Pleurx catheter, almost complete collapse of the left lung on the imaging.  At the present time, patient is in the stepdown unit, continuing to receive oxygen by mask.  Less short of breath than on admission.  Denies any active pain, fevers, or chills.     Past Medical History:  Diagnosis Date  . Diabetes mellitus without complication ([place])   . Malignant neoplasm of unspecified part of unspecified bronchus or lung ([place]) 03/30/2017  :  Past Surgical History:  Procedure Laterality Date  . IR GUIDED DRAIN W CATHETER PLACEMENT  05/19/2017  . IR THORACENTESIS ASP PLEURAL SPACE W/IMG GUIDE  03/16/2017  . VIDEO  BRONCHOSCOPY Bilateral 03/25/2017   Procedure: VIDEO BRONCHOSCOPY WITH FLUORO;  Surgeon: Juanito Doom, MD;  Location: [facility];  Service: Cardiopulmonary;  Laterality: Bilateral;  :   Current Facility-Administered Medications:  .  0.9 %  sodium chloride infusion, , Intravenous, Continuous, Ivor Costa, MD, Last Rate: 75 mL/hr at 07/17/17 2319 .  acetaminophen (TYLENOL) tablet 650 mg, 650 mg, Oral, Q6H PRN, Ivor Costa, MD .  albuterol (PROVENTIL) (2.5 MG/3ML) 0.083% nebulizer solution 2.5 mg, 2.5 mg, Nebulization, Q4H PRN, Ivor Costa, MD, 2.5 mg at 07/18/17 0515 .  amiodarone (PACERONE) tablet 400 mg, 400 mg, Oral, BID, Ivor Costa, MD, 400 mg at 07/17/17 2318 .  chlorhexidine (PERIDEX) 0.12 % solution 15 mL, 15 mL, Mouth Rinse, BID, Ivor Costa, MD, 15 mL at 07/17/17 2319 .  clotrimazole (MYCELEX) troche 10 mg, 10 mg, Oral, 5 X Daily, Ivor Costa, MD, 10 mg at 07/18/17 0515 .  enoxaparin (LOVENOX) injection 40 mg, 40 mg, Subcutaneous, Q24H, Ivor Costa, MD, 40 mg at 07/17/17 2319 .  guaiFENesin (ROBITUSSIN) 100 MG/5ML solution 200 mg, 10 mL, Oral, Q4H PRN, Ivor Costa, MD .  insulin aspart (novoLOG) injection 0-5 Units, 0-5 Units, Subcutaneous, QHS, Ivor Costa, MD .  insulin aspart (novoLOG) injection 0-9 Units, 0-9 Units, Subcutaneous, TID WC, Ivor Costa, MD .  ipratropium-albuterol (DUONEB) 0.5-2.5 (3) MG/3ML nebulizer solution 3 mL, 3 mL, Nebulization, TID, Ivor Costa, MD .  loperamide (IMODIUM) capsule 2 mg, 2 mg, Oral, TID PRN, Ivor Costa, MD .  loratadine (CLARITIN) tablet 10 mg, 10 mg, Oral, Daily, Ivor Costa, MD .  LORazepam (ATIVAN) tablet 0.5 mg, 0.5 mg, Oral, Q6H PRN, Ivor Costa, MD .  MEDLINE mouth rinse, 15 mL, Mouth Rinse, q12n4p, Ivor Costa, MD .  menthol-cetylpyridinium (CEPACOL) lozenge 3 mg, 1 lozenge, Oral, Q6H PRN, Ivor Costa, MD .  ondansetron [facility]) injection 4 mg, 4 mg, Intravenous, Q8H PRN, Ivor Costa, MD .  prochlorperazine (COMPAZINE) tablet 10 mg, 10 mg, Oral,  Q6H PRN, Ivor Costa, MD .  tamsulosin (FLOMAX) capsule 0.4 mg, 0.4 mg, Oral, Daily, Ivor Costa, MD, 0.4 mg at 07/17/17 2318 .  zolpidem (AMBIEN) tablet 5 mg, 5 mg, Oral, QHS PRN, Ivor Costa, MD:  . amiodarone  400 mg Oral BID  . chlorhexidine  15 mL Mouth Rinse BID  . clotrimazole  10 mg Oral 5 X Daily  . enoxaparin (LOVENOX) injection  40 mg Subcutaneous Q24H  . insulin aspart  0-5 Units Subcutaneous QHS  . insulin aspart  0-9 Units Subcutaneous TID WC  . ipratropium-albuterol  3 mL Nebulization TID  . loratadine  10 mg Oral Daily  . mouth rinse  15 mL Mouth Rinse q12n4p  . tamsulosin  0.4 mg Oral Daily  :  No Known Allergies:  Family History  Problem Relation Age of Onset  . Cancer Mother   . Diabetes Neg Hx   . Stroke Neg Hx   . Heart disease Neg Hx      Social History   Socioeconomic History  . Marital status: Married    Spouse name: Not on file  . Number of children: Not on file  . Years of education: Not on file  . Highest education level: Not on file  Social Needs  . Financial resource strain: Not on file  . Food insecurity - worry: Not on file  . Food insecurity - inability: Not on file  . Transportation needs - medical: Not on file  . Transportation needs - non-medical: Not on file  Occupational History  . Not on file  Tobacco Use  . Smoking status: Former Smoker    Packs/day: 1.00    Years: 58.00    Pack years: 58.00    Types: Cigarettes    Last attempt to quit: 11/21/2016    Years since quitting: 0.6  . Smokeless tobacco: Never Used  Substance and Sexual Activity  . Alcohol use: No    Frequency: Never  . Drug use: No  . Sexual activity: Not on file  Other Topics Concern  . Not on file  Social History Narrative  . Not on file     Review of Systems: Per HPI.  All other systems are negative.   Physical Exam:  Blood pressure (!) 99/44, pulse 84, temperature 97.9 F (36.6 C), temperature source Oral, resp. rate (!) 22, height 5\' 10"  (1.778  m), weight 128 lb 1.4 oz (58.1 kg), SpO2 93 %. Elderly cachectic male.  Somnolent, but easily arousable.  Does not appear to have increased work of breathing at this time. HEENT: Oxygen mask in place, anicteric, moist mucous membranes Lungs: Minimal audible breath sounds on the left.  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 Patient underwent first cycle of palliative chemotherapy 04/15/2017 with subsequent hospitalization for multilobar pneumonia. Patient presents for baseline restaging prior to resuming chemotherapy. EXAM: CT CHEST, ABDOMEN, AND PELVIS WITH CONTRAST TECHNIQUE: Multidetector CT imaging of the chest, abdomen and pelvis was performed following the standard protocol during bolus administration of intravenous contrast. CONTRAST:  49mL ISOVUE-300 IOPAMIDOL (ISOVUE-300) INJECTION 61% COMPARISON:  05/06/2017 unenhanced chest CT. 04/30/2017 IV contrast-enhanced chest CT. 03/23/2017 PET-CT. FINDINGS: CT CHEST FINDINGS Cardiovascular: Normal heart size. Stable small pericardial effusion/thickening. Atherosclerotic nonaneurysmal thoracic aorta. Normal caliber main pulmonary artery. No central pulmonary emboli. Mediastinum/Nodes: No discrete thyroid nodules. The upper thoracic esophagus is newly mildly dilated and filled with oral contrast and debris, with an apparent esophageal caliber transition at the level of the infiltrative central left lung tumor, which  appears to encase/invade the thoracic esophagus (series 2/image 34). No axillary adenopathy. No right hilar adenopathy. No discrete pathologically enlarged mediastinal nodes. Lungs/Pleura: There is prominent volume loss in the left hemithorax. There is a moderate left hydropneumothorax with circumferential left pleural thickening and enhancement, with basilar left PleurX catheter well positioned with the tip in posterior mid to upper left pleural space. The left hydropneumothorax is overall significantly decreased in size since 05/06/2017 chest CT. Infiltrative poorly marginated partially necrotic heterogeneously enhancing central left lung mass measures approximately 10.0 x 6.4 cm (series 2/image 36), overall not appreciably changed in size from 04/30/2017 chest CT, where it measured 10.9 x 6.6 cm. The central left lung mass directly invades the left hilum and subcarinal mediastinum. Occlusion of the left upper and left lower lobe bronchi by the mass, with complete left lower lobe atelectasis and limited aeration of the left upper lobe, with bandlike opacities throughout the largely collapsed left upper lobe, compatible with a combination of atelectasis and postinfectious scarring. The necrotic left lower lobe portion of the tumor appears to directly communicate with the left pleural space (series 2/images 41 and 42). No right pleural effusion. Moderate centrilobular and paraseptal emphysema with diffuse bronchial wall thickening. Stable calcified subcentimeter medial right lower lobe granuloma. No acute consolidative airspace disease or significant pulmonary nodules in the right lung. Musculoskeletal:  No aggressive appearing focal osseous lesions. CT ABDOMEN PELVIS FINDINGS Hepatobiliary: Normal liver with no liver mass. Cholelithiasis. Stable nonspecific wall thickening in the fundal portion of the gallbladder. No pericholecystic fluid. No biliary ductal dilatation. Pancreas: Normal, with no mass or duct  dilation. Spleen: Normal size. No mass. Adrenals/Urinary Tract: No discrete adrenal nodules. No hydronephrosis. Simple 2.4 cm lateral lower right renal cyst. Several subcentimeter hypodense renal cortical lesions in both kidneys are too small to characterize. Bladder is completely collapsed by indwelling Foley catheter. New 18 x 10 mm calcification at the right posterior bladder, presumably a bladder stone. Stomach/Bowel: Normal non-distended stomach. Normal caliber small bowel with no small bowel wall thickening. Normal appendix. Mild sigmoid diverticulosis, with no large bowel wall thickening or pericolonic fat stranding. Vascular/Lymphatic: Atherosclerotic abdominal aorta with stable 3.0 cm infrarenal abdominal aortic aneurysm. Patent portal, splenic, hepatic and renal veins. No pathologically enlarged lymph nodes in the abdomen or pelvis. Reproductive: Stable moderately enlarged prostate. Other: No pneumoperitoneum, ascites or focal fluid collection. Musculoskeletal: No aggressive appearing focal osseous lesions. 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 IMPRESSION: 1. Infiltrative partially necrotic central left lung tumor is overall stable in size since 04/30/2017 chest CT. Persistent  occlusion of the central left lung airways with complete left lower lobe atelectasis and near complete left upper lobe scarring/atelectasis. 2. Moderate left hydropneumothorax is significantly decreased in size since 05/06/2017 chest CT with left PleurX catheter in place. Probable direct communication of the necrotic left lower lobe portion of the tumor with the left pleural space. 3. Circumferential left pleural thickening and enhancement compatible with malignant effusion. 4. Findings worrisome for developing malignant stricture in the midthoracic esophagus, with new dilatation of the upper thoracic esophagus, which is filled with oral contrast and debris. Patient is at high risk of aspiration. 5. Otherwise no  evidence of new or progressive metastatic disease in the chest. No evidence of metastatic disease in the abdomen, pelvis or skeleton. 6. Stable small pericardial effusion/thickening. 7. New 18 x 10 mm right posterior bladder calcification adjacent to the indwelling Foley catheter, presumably a bladder stone. No hydronephrosis. 8. Aortic Atherosclerosis (ICD10-I70.0) and Emphysema (ICD10-J43.9). Numerous chronic findings as above. These results will be called to the ordering clinician or representative by the Radiologist Assistant, and communication documented in the PACS or zVision Dashboard. Electronically Signed   By: Ilona Sorrel M.D.   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 Patient underwent first cycle of palliative chemotherapy 04/15/2017 with subsequent hospitalization for multilobar pneumonia. Patient presents for baseline restaging prior to resuming chemotherapy. EXAM: CT CHEST, ABDOMEN, AND PELVIS WITH CONTRAST TECHNIQUE: Multidetector CT imaging of the chest, abdomen and pelvis was performed following the standard protocol during bolus administration of intravenous contrast. CONTRAST:  18mL ISOVUE-300 IOPAMIDOL (ISOVUE-300) INJECTION 61% COMPARISON:  05/06/2017 unenhanced chest CT. 04/30/2017 IV contrast-enhanced chest CT. 03/23/2017 PET-CT. FINDINGS: CT CHEST FINDINGS Cardiovascular: Normal heart size. Stable small pericardial effusion/thickening. Atherosclerotic nonaneurysmal thoracic aorta. Normal caliber main pulmonary artery. No central pulmonary emboli. Mediastinum/Nodes: No discrete thyroid nodules. The upper thoracic esophagus is newly mildly dilated and filled with oral contrast and debris, with an apparent esophageal caliber transition at the level of the infiltrative central left lung tumor, which appears to encase/invade the thoracic esophagus (series 2/image 34). No  axillary adenopathy. No right hilar adenopathy. No discrete pathologically enlarged mediastinal nodes. Lungs/Pleura: There is prominent volume loss in the left hemithorax. There is a moderate left hydropneumothorax with circumferential left pleural thickening and enhancement, with basilar left PleurX catheter well positioned with the tip in posterior mid to upper left pleural space. The left hydropneumothorax is overall significantly decreased in size since 05/06/2017 chest CT. Infiltrative poorly marginated partially necrotic heterogeneously enhancing central left lung mass measures approximately 10.0 x 6.4 cm (series 2/image 36), overall not appreciably changed in size from 04/30/2017 chest CT, where it measured 10.9 x 6.6 cm. The central left lung mass directly invades the left hilum and subcarinal mediastinum. Occlusion of the left upper and left lower lobe bronchi by the mass, with complete left lower lobe atelectasis and limited aeration of the left upper lobe, with bandlike opacities throughout the largely collapsed left upper lobe, compatible with a combination of atelectasis and postinfectious scarring. The necrotic left lower lobe portion of the tumor appears to directly communicate with the left pleural space (series 2/images 41 and 42). No right pleural effusion. Moderate centrilobular and paraseptal emphysema with diffuse bronchial wall thickening. Stable calcified subcentimeter medial right lower lobe granuloma. No acute consolidative airspace disease or significant pulmonary nodules in the right lung. Musculoskeletal:  No aggressive appearing  focal osseous lesions. CT ABDOMEN PELVIS FINDINGS Hepatobiliary: Normal liver with no liver mass. Cholelithiasis. Stable nonspecific wall thickening in the fundal portion of the gallbladder. No pericholecystic fluid. No biliary ductal dilatation. Pancreas: Normal, with no mass or duct dilation. Spleen: Normal size. No mass. Adrenals/Urinary Tract: No discrete  adrenal nodules. No hydronephrosis. Simple 2.4 cm lateral lower right renal cyst. Several subcentimeter hypodense renal cortical lesions in both kidneys are too small to characterize. Bladder is completely collapsed by indwelling Foley catheter. New 18 x 10 mm calcification at the right posterior bladder, presumably a bladder stone. Stomach/Bowel: Normal non-distended stomach. Normal caliber small bowel with no small bowel wall thickening. Normal appendix. Mild sigmoid diverticulosis, with no large bowel wall thickening or pericolonic fat stranding. Vascular/Lymphatic: Atherosclerotic abdominal aorta with stable 3.0 cm infrarenal abdominal aortic aneurysm. Patent portal, splenic, hepatic and renal veins. No pathologically enlarged lymph nodes in the abdomen or pelvis. Reproductive: Stable moderately enlarged prostate. Other: No pneumoperitoneum, ascites or focal fluid collection. Musculoskeletal: No aggressive appearing focal osseous lesions. 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 IMPRESSION: 1. Infiltrative partially necrotic central left lung tumor is overall stable in size since 04/30/2017 chest CT. Persistent occlusion of the central left lung airways with complete left lower lobe atelectasis and near complete left upper lobe scarring/atelectasis. 2. Moderate left hydropneumothorax is significantly decreased in size since 05/06/2017 chest CT with left PleurX catheter in place. Probable direct communication of the necrotic left lower lobe portion of the tumor with the left pleural space. 3. Circumferential left pleural thickening and enhancement compatible with malignant effusion. 4. Findings worrisome for developing malignant stricture in the midthoracic esophagus, with new dilatation of the upper thoracic esophagus, which is filled with oral contrast and debris. Patient is at high risk of aspiration. 5. Otherwise no evidence of new or progressive metastatic disease in the chest. No evidence  of metastatic disease in the abdomen, pelvis or skeleton. 6. Stable small pericardial effusion/thickening. 7. New 18 x 10 mm right posterior bladder calcification adjacent to the indwelling Foley catheter, presumably a bladder stone. No hydronephrosis. 8. Aortic Atherosclerosis (ICD10-I70.0) and Emphysema (ICD10-J43.9). Numerous chronic findings as above. These results will be called to the ordering clinician or representative by the Radiologist Assistant, and communication documented in the PACS or zVision Dashboard. Electronically Signed   By: Ilona Sorrel M.D.   On: 07/14/2017 15:19   Dg Chest Port 1 View  Result Date: 07/17/2017 CLINICAL DATA:  77 year old male with stage IV lung cancer diagnosed November 2018. Shortness of breath. EXAM: PORTABLE CHEST 1 VIEW COMPARISON:  Restaging CT chest abdomen and pelvis 07/14/2017 and earlier. FINDINGS: Two portable AP semi upright views at 1745 hours. Progressive opacification of the left hemithorax since 05/21/2017, although the left lung ventilation appears only mildly worsened since 07/14/2017. There is subtotal opacification of the left hemithorax now. A percutaneous pleural catheter remains in place. Leftward shift of the mediastinum may have increased along with the opacification of the residual left perihilar lung since 07/14/2017. The left mainstem bronchus is poorly visible. The right lung appears hyperinflated and clear. Visualized tracheal air column is within normal limits. IMPRESSION: 1. Subtotal opacification of the left hemithorax now, with progressive opacification of the small area of residual pneumatized lung since the recent restaging CT 07/14/2017. Given the recent CT appearance, this might reflect a combination of progressive left bronchial compression due to tumor and/or mucous plugging. 2. Stable left pleural drain. 3. Hyperinflated right lung. Electronically Signed   By:  Genevie Ann M.D.   On: 07/17/2017 18:32    Assessment and Plan:  77 y.o.  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  Very poor tolerance of initial chemoimmunotherapy implicated by severe pneumonia.  Persistent and progressive decompensation with declining performance status.  Repeat imaging demonstrating likely reaccumulation of the left pleural effusion.  In the past, patient has derived symptomatic improvement from evacuation of the fluid.  He does have Pleurx in place.  Worsening symptoms could be attributable to an infection as patient is very likely to develop recurrent pneumonia in the lung does not open appropriately.  At this time, patient's performance status is ECOG 4, it is unlikely to improve significantly without cancer-directed therapy, but even there improvement likelihood is likely not very significant.  Nevertheless, I would defer this conversation to the expertise of Dr. [name] who is the primary treating oncologist for the patient.  Recommendations: -Agree with the current plan by pulmonology to evacuate some of the left pleural effusion - Continue supportive care -I will defer goals of care conversation to Dr. [name] he returns to clinic on Monday.    Ardath Sax, MD 07/18/2017, 9:39 AM

## 2017-07-18 NOTE — Progress Notes (Signed)
RT called to bedside to assess patient. Patient was on NRB when RT arrived. SOB, labored, and O2 sats 89%. Rapid response RN called and attending MD paged. Patient was given PRN breathing tx and denied any improvement in SOB. Patient ultimately transferred to ICU and ABG ordered. Patient O2 sats at this time 96% on NRB. RT will continue to monitor patient.

## 2017-07-18 NOTE — Consult Note (Addendum)
PULMONARY / CRITICAL CARE MEDICINE   Name: Maurice Tyler MRN: [id number] DOB: July 15, 1940    ADMISSION DATE:  07/17/2017 CONSULTATION DATE:  07/18/2017  REFERRING MD:  Denton Brick  CHIEF COMPLAINT: Shortness of breath  HISTORY OF PRESENT ILLNESS:   This is a pleasant 77 year old male whom I know very well from his original diagnosis of lung cancer.  He first saw me in clinic and I am the one who performed his bronchoscopy.  After his diagnosis he started treatment for his malignancy but this was complicated by pneumonia and an empyema.  From December through January 2019 he required a lengthy hospitalization with high flow oxygen, continuous drainage of the left pleural space and IV antibiotics for the empyema.  Thoracic surgery was aware of his case and after lengthy discussion we determined that he had lung entrapment and there is no good option for managing the chronic left-sided pleural effusion at this point.  A Pleurx catheter was placed for comfort.  During that hospitalization he appeared significantly more emaciated than when I first met him in the fall 2018.  He expressed to me that his desire was to take his RV with his wife out [place] and then die.  However, several times during the hospitalization he struggled with his goals of care and would request aggressive management for his malignancy but then other times would express that he was more interested in quality of life.  Since his hospitalization he was discharged to a nursing facility where his oxygen dose was weaned down.  He developed A. fib there.  He last saw oncology on March 5 and they said that he needed to have another staging CT scan before considering other treatment.  He was admitted yesterday with increasing shortness of breath and hypoxemia.  They have been draining approximately 200 cc of fluid from the left chest twice a week from his Pleurx catheter.  He says that he has not had fevers chills or cough.  He does not feel  congested.  He was admitted overnight and was brought down emergently this morning in the setting of worsening hypoxemia.  He is asking to have the left pleural fluid drained.  This is not been done yet.  PAST MEDICAL HISTORY :  He  has a past medical history of Diabetes mellitus without complication ([place]) and Malignant neoplasm of unspecified part of unspecified bronchus or lung ([place]) (03/30/2017).  PAST SURGICAL HISTORY: He  has a past surgical history that includes IR THORACENTESIS ASP PLEURAL SPACE W/IMG GUIDE (03/16/2017); Video bronchoscopy (Bilateral, 03/25/2017); and IR Guided Drain W Catheter Placement (05/19/2017).  No Known Allergies  No current facility-administered medications on file prior to encounter.    Current Outpatient Medications on File Prior to Encounter  Medication Sig  . acetaminophen (TYLENOL) 500 MG tablet Take 1,000 mg by mouth every 6 (six) hours as needed for moderate pain.  Marland Kitchen amiodarone (PACERONE) 400 MG tablet Take 1 tablet (400 mg total) by mouth 2 (two) times daily.  . cetirizine (ZYRTEC) 10 MG tablet Take 10 mg daily by mouth. ALLERTEC  . clotrimazole (MYCELEX) 10 MG troche Take 10 mg by mouth 5 (five) times daily.  . furosemide (LASIX) 20 MG tablet Take 1 tablet (20 mg total) by mouth daily.  Marland Kitchen guaiFENesin (ROBITUSSIN) 100 MG/5ML SOLN Take 10 mLs by mouth every 4 (four) hours as needed for cough or to loosen phlegm.  . menthol-cetylpyridinium (CEPACOL) 3 MG lozenge Take 1 lozenge by mouth every 6 (six) hours as  needed for sore throat.  . metFORMIN (GLUCOPHAGE) 500 MG tablet Take 500 mg 2 (two) times daily with a meal by mouth.   . potassium chloride 20 MEQ/15ML (10%) SOLN Take 22.5 mLs (30 mEq total) by mouth daily.  . prochlorperazine (COMPAZINE) 10 MG tablet Take 1 tablet (10 mg total) by mouth every 6 (six) hours as needed for nausea or vomiting.  . sulfamethoxazole-trimethoprim (BACTRIM DS,SEPTRA DS) 800-160 MG tablet Take 1 tablet by mouth 2 (two) times  daily.  . tamsulosin (FLOMAX) 0.4 MG CAPS capsule Take 0.4 mg by mouth daily.  Marland Kitchen ACCU-CHEK FASTCLIX LANCETS MISC   . ACCU-CHEK GUIDE test strip USE TO CHECK BLOOD SUGAR BID  . albuterol (PROVENTIL HFA;VENTOLIN HFA) 108 (90 Base) MCG/ACT inhaler Inhale 1 puff every 6 (six) hours as needed into the lungs for wheezing or shortness of breath.  . loperamide (IMODIUM A-D) 2 MG tablet Take 2 mg by mouth 3 (three) times daily as needed for diarrhea or loose stools.  Marland Kitchen LORazepam (ATIVAN) 0.5 MG tablet Take 1 tablet (0.5 mg total) by mouth every 6 (six) hours as needed for anxiety.  . ondansetron (ZOFRAN-ODT) 4 MG disintegrating tablet Take 4 mg by mouth every 8 (eight) hours as needed for nausea or vomiting.    FAMILY HISTORY:  His indicated that his mother is deceased. He indicated that his father is deceased. He indicated that his sister is deceased. He indicated that his brother is deceased. He indicated that the status of his neg hx is unknown.   SOCIAL HISTORY: He  reports that he quit smoking about 7 months ago. His smoking use included cigarettes. He has a 58.00 pack-year smoking history. he has never used smokeless tobacco. He reports that he does not drink alcohol or use drugs.  REVIEW OF SYSTEMS:   Gen: Denies fever, chills, + weight loss, fatigue, night sweats HEENT: Denies blurred vision, double vision, hearing loss, tinnitus, sinus congestion, rhinorrhea, sore throat, neck stiffness, dysphagia PULM: as per HPI CV: Denies chest pain, edema, orthopnea, paroxysmal nocturnal dyspnea, palpitations GI: Denies abdominal pain, nausea, vomiting, diarrhea, hematochezia, melena, constipation, change in bowel habits GU: Denies dysuria, hematuria, polyuria, oliguria, urethral discharge Endocrine: Denies hot or cold intolerance, polyuria, polyphagia or appetite change Derm: Denies rash, dry skin, scaling or peeling skin change Heme: Denies easy bruising, bleeding, bleeding gums Neuro: Denies  headache, numbness, weakness, slurred speech, loss of memory or consciousness   SUBJECTIVE:  As above  VITAL SIGNS: BP (!) 99/44 (BP Location: Right Arm)   Pulse 84   Temp 97.9 F (36.6 C) (Oral)   Resp (!) 22   Ht 5' 10" (1.778 m)   Wt 128 lb 1.4 oz (58.1 kg)   SpO2 93%   BMI 18.38 kg/m   HEMODYNAMICS:    VENTILATOR SETTINGS:    INTAKE / OUTPUT: I/O last 3 completed shifts: In: 501.3 [I.V.:501.3] Out: 500 [Urine:500]  PHYSICAL EXAMINATION:  General:  Tachypnea in bed HENT: NCAT OP clear PULM: Diminished left side, wheezing on right , normal effort CV: RRR, no mgr GI: BS+, soft, nontender MSK: normal bulk and tone Neuro: awake, alert, no distress, MAEW   LABS:  BMET Recent Labs  Lab 07/17/17 1750  NA 139  K 4.4  CL 102  CO2 33*  BUN 24*  CREATININE 0.64  GLUCOSE 124*    Electrolytes Recent Labs  Lab 07/17/17 1750  CALCIUM 8.6*    CBC Recent Labs  Lab 07/17/17 1750  WBC 11.1*  HGB 10.0*  HCT 33.9*  PLT 345    Coag's No results for input(s): APTT, INR in the last 168 hours.  Sepsis Markers Recent Labs  Lab 07/17/17 2042 07/17/17 2101 07/17/17 2356  LATICACIDVEN 1.0  --  1.3  PROCALCITON  --  0.19  --     ABG Recent Labs  Lab 07/17/17 2005  PHART 7.486*  PCO2ART 39.0  PO2ART 123*    Liver Enzymes Recent Labs  Lab 07/17/17 1750  AST 15  ALT 20  ALKPHOS 129*  BILITOT 0.4  ALBUMIN 2.3*    Cardiac Enzymes No results for input(s): TROPONINI, PROBNP in the last 168 hours.  Glucose Recent Labs  Lab 07/17/17 2245 07/18/17 0749  GLUCAP 146* 136*    Imaging Dg Chest Port 1 View  Result Date: 07/17/2017 CLINICAL DATA:  77 year old male with stage IV lung cancer diagnosed November 2018. Shortness of breath. EXAM: PORTABLE CHEST 1 VIEW COMPARISON:  Restaging CT chest abdomen and pelvis 07/14/2017 and earlier. FINDINGS: Two portable AP semi upright views at 1745 hours. Progressive opacification of the left  hemithorax since 05/21/2017, although the left lung ventilation appears only mildly worsened since 07/14/2017. There is subtotal opacification of the left hemithorax now. A percutaneous pleural catheter remains in place. Leftward shift of the mediastinum may have increased along with the opacification of the residual left perihilar lung since 07/14/2017. The left mainstem bronchus is poorly visible. The right lung appears hyperinflated and clear. Visualized tracheal air column is within normal limits. IMPRESSION: 1. Subtotal opacification of the left hemithorax now, with progressive opacification of the small area of residual pneumatized lung since the recent restaging CT 07/14/2017. Given the recent CT appearance, this might reflect a combination of progressive left bronchial compression due to tumor and/or mucous plugging. 2. Stable left pleural drain. 3. Hyperinflated right lung. Electronically Signed   By: Genevie Ann M.D.   On: 07/17/2017 18:32     STUDIES:  Chest x-ray from July 17, 2017 images independently reviewed showing complete opacification of the left lung, clear right lung.   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  He likely has a worsening pleural effusion on the left and may have pneumonia on the left as well.  Overall, he has continued to lose weight and is even more emaciated now than when I last saw him in January 2019 and he told me that he just wanted to drive out [place] and die.  We may be able to help him here by adding treatment for mucous plugging and mucociliary clearance, rating the left-sided pleural effusion, and adding some Solu-Medrol because he is wheezing now.  However, his overall prognosis is grim.  He and I had a frank conversation about  CPR mechanical ventilation.  I explained to him that these measures would cause more harm than good considering the advanced nature of his disease and his overall poor prognosis.  He told me that he did not want either 1 of these.  ASSESSMENT / PLAN:  PULMONARY A: Recent left empyema Likely left pneumonia Increasing left-sided pleural effusion Pleurx catheter in the left side Emphysema COPD exacerbation  P:   Add Solu-Medrol Start drainage with  [place] device in the left side Continue bronchodilators Preferentially keep the right side down Use O2 to  maintain O2 saturation greater than 90% Made DNR per his wish, his wife tells me that he did not want to go through any more cancer treatment and he didn't want advanced life support  If he gets worse today despite the changes I've made then we should use morphine to relieve his dyspnea   My cc time managing his respiratory failure and discussion with he and his wife regarding end of life issues: Roy, MD [facility] Pager: [phone] Cell: [phone] After 3pm or if no response, call [phone]   07/18/2017, 9:54 AM

## 2017-07-18 NOTE — Progress Notes (Signed)
Patient placed on [place] with aerosol mask during breathing txs. Patient unable to maintain O2 saturation w/o [place]. Patient returned to NRB; currently 93% on NRB. RT will continue to monitor patient.

## 2017-07-18 NOTE — Progress Notes (Signed)
Patient Demographics:    Maurice Tyler, is a 77 y.o. male, DOB - 12-20-1940, WRU:[id number]  Admit date - 07/17/2017   Admitting Physician Ivor Costa, MD  Outpatient Primary MD for the patient is [name], MD  LOS - 0   Chief Complaint  Patient presents with  . 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 Subtotal opacification of the left hemithorax now, with progressive opacification of the small area of residual pneumatized lung since the recent restaging CT 07/14/2017. Given the recent CT appearance, this might reflect a combination of progressive left bronchial compression due to tumor and/or mucous plugging. 2. Stable left pleural drain. 3. Hyperinflated right lung.    Plan:- 1) acute on chronic hypoxic respiratory failure-patient with near total opacification of the left lung, please see chest x-ray report above, discussed with Dr. Lake Bells from [facility] service who is familiar with patient, official pulmonology consult appreciated, recommended IV Solu-Medrol, drainage of Lt Pleurx Catheter with [place] device, continue bronchodilators.  Patient's respiratory status decompensated on 07/18/2017, patient was transferred to stepdown unit  2)Social/Ethics-patient  is now a DNR/DNI, please see consult note dated 07/18/2017 from Dr. Simonne Maffucci.  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 Earlie Server, discussed with Dr. Lebron Conners on 07/18/2017, official oncology consult appreciated  4)DM-anticipate worsening glycemic control due to steroids last A1c was 7.4, Use Novolog/Humalog Sliding scale insulin with Accu-Cheks/Fingersticks as ordered   5)Afib-  chadsvasc score is 3, patient is currently not on anticoagulation due to complex comorbid condition and poor prognosis, continue amiodarone  Code Status : DNR/DNI  Disposition Plan  : TBD  Consults  :  PCCM/Palliative  DVT Prophylaxis  :  Lovenox   Lab Results  Component Value Date   PLT 345 07/17/2017    Inpatient Medications  Scheduled Meds: . amiodarone  400 mg Oral BID  . arformoterol  15 mcg Nebulization BID  . budesonide (PULMICORT) nebulizer solution  0.5 mg Nebulization BID  . chlorhexidine  15 mL Mouth Rinse BID  . clotrimazole  10 mg Oral 5 X Daily  . enoxaparin (LOVENOX) injection  40 mg Subcutaneous Q24H  . guaiFENesin  1,200 mg Oral BID  . insulin aspart  0-5 Units Subcutaneous QHS  . insulin aspart  0-9 Units Subcutaneous TID WC  . ipratropium-albuterol  3 mL Nebulization Q6H  .  loratadine  10 mg Oral Daily  . mouth rinse  15 mL Mouth Rinse q12n4p  . methylPREDNISolone (SOLU-MEDROL) injection  60 mg Intravenous Q12H  . sodium chloride HYPERTONIC  4 mL Nebulization BID  . tamsulosin  0.4 mg Oral Daily   Continuous Infusions: . sodium chloride 75 mL/hr at 07/18/17 1129   PRN Meds:.acetaminophen, albuterol, loperamide, LORazepam, menthol-cetylpyridinium, ondansetron, prochlorperazine, zolpidem    Anti-infectives (From admission, onward)   Start      Dose/Rate Route Frequency Ordered Stop   07/17/17 2200  sulfamethoxazole-trimethoprim (BACTRIM DS,SEPTRA DS) 800-160 MG per tablet 1 tablet  Status:  Discontinued     1 tablet Oral 2 times daily 07/17/17 2040 07/17/17 2128        Objective:   Vitals:   07/18/17 0934 07/18/17 1017 07/18/17 1200 07/18/17 1400  BP:  120/66 121/73 132/63  Pulse:  (!) 101 95 (!) 104  Resp: (!) 30 (!) 27 (!) 26 (!) 28  Temp:  98.5 F (36.9 C)    TempSrc:  Oral    SpO2: (!) 89% 95% 100% 98%  Weight:      Height:        Wt Readings from Last 3 Encounters:  07/17/17 58.1 kg (128 lb 1.4 oz)  07/17/17 61.1 kg (134 lb 9.6 oz)  07/10/17 61.1 kg (134 lb 9.6 oz)     Intake/Output Summary (Last 24 hours) at 07/18/2017 1816 Last data filed at 07/18/2017 0911 Gross per 24 hour  Intake 723.25 ml  Output 500 ml  Net 223.25 ml     Physical Exam  Gen:- Awake Alert, acute distress, increased work of breathing HEENT:- [place].AT, No sclera icterus Nose- NRB Neck-Supple Neck,No JVD,.  Lungs- very diminished on the left, Pleurx catheter noted  CV- S1, S2 normal Abd-  +ve B.Sounds, Abd Soft, No tenderness,    Extremity/Skin:- No  edema,  warm Psych-affect is appropriate, oriented x3 Neuro-no new focal deficits, no tremors   Data Review:   Micro Results Recent Results (from the past 240 hour(s))  Culture, blood (x 2)     Status: None (Preliminary result)   Collection Time: 07/17/17  9:02 PM  Result Value Ref Range Status   Specimen Description   Final    BLOOD RIGHT HAND Performed at [facility] [address] [place], [place] [address]    Special Requests   Final    BOTTLES DRAWN AEROBIC AND ANAEROBIC Blood Culture adequate volume Performed at [facility] [address] [place], [place] [address]    Culture   Final    NO GROWTH < 24 HOURS Performed at [facility] [address], [place], [place] [address]    Report Status PENDING  Incomplete    Culture, blood (x 2)     Status: None (Preliminary result)   Collection Time: 07/17/17  9:09 PM  Result Value Ref Range Status   Specimen Description   Final    BLOOD LEFT HAND Performed at [facility] [address], [place], [place] [address]    Special Requests   Final    BOTTLES DRAWN AEROBIC AND ANAEROBIC Blood Culture adequate volume Performed at [facility] [address] [place], [place] [address]    Culture   Final    NO GROWTH < 24 HOURS Performed at [facility] [address], [place], [place] [address]    Report Status PENDING  Incomplete  MRSA PCR Screening  Status: None   Collection Time: 07/17/17 10:48 PM  Result Value Ref Range Status   MRSA by PCR NEGATIVE NEGATIVE Final    Comment:        The GeneXpert MRSA Assay (FDA approved for NASAL specimens only), is one component of a comprehensive MRSA colonization surveillance program. It is not intended to diagnose MRSA infection nor to guide or monitor treatment for MRSA infections. Performed at [facility] [place] [address], [place], [place] [address]     Radiology Reports Ct Chest W Contrast  Result Date: 07/14/2017 CLINICAL DATA:  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 Patient underwent first cycle of palliative chemotherapy 04/15/2017 with subsequent hospitalization for multilobar pneumonia. Patient presents for baseline restaging prior to resuming chemotherapy. EXAM: CT CHEST, ABDOMEN, AND PELVIS WITH CONTRAST TECHNIQUE: Multidetector CT imaging of the chest, abdomen and pelvis was performed following the standard protocol during bolus administration of intravenous contrast. CONTRAST:  67mL ISOVUE-300 IOPAMIDOL (ISOVUE-300) INJECTION 61% COMPARISON:  05/06/2017 unenhanced chest CT. 04/30/2017 IV contrast-enhanced chest CT. 03/23/2017 PET-CT. FINDINGS: CT CHEST FINDINGS Cardiovascular: Normal heart size. Stable  small pericardial effusion/thickening. Atherosclerotic nonaneurysmal thoracic aorta. Normal caliber main pulmonary artery. No central pulmonary emboli. Mediastinum/Nodes: No discrete thyroid nodules. The upper thoracic esophagus is newly mildly dilated and filled with oral contrast and debris, with an apparent esophageal caliber transition at the level of the infiltrative central left lung tumor, which appears to encase/invade the thoracic esophagus (series 2/image 34). No axillary adenopathy. No right hilar adenopathy. No discrete pathologically enlarged mediastinal nodes. Lungs/Pleura: There is prominent volume loss in the left hemithorax. There is a moderate left hydropneumothorax with circumferential left pleural thickening and enhancement, with basilar left PleurX catheter well positioned with the tip in posterior mid to upper left pleural space. The left hydropneumothorax is overall significantly decreased in size since 05/06/2017 chest CT. Infiltrative poorly marginated partially necrotic heterogeneously enhancing central left lung mass measures approximately 10.0 x 6.4 cm (series 2/image 36), overall not appreciably changed in size from 04/30/2017 chest CT, where it measured 10.9 x 6.6 cm. The central left lung mass directly invades the left hilum and subcarinal mediastinum. Occlusion of the left upper and left lower lobe bronchi by the mass, with complete left lower lobe atelectasis and limited aeration of the left upper lobe, with bandlike opacities throughout the largely collapsed left upper lobe, compatible with a combination of atelectasis and postinfectious scarring. The necrotic left lower lobe portion of the tumor appears to directly communicate with the left pleural space (series 2/images 41 and 42). No right pleural effusion. Moderate centrilobular and paraseptal emphysema with diffuse bronchial wall thickening. Stable calcified subcentimeter medial right lower lobe granuloma. No acute  consolidative airspace disease or significant pulmonary nodules in the right lung. Musculoskeletal:  No aggressive appearing focal osseous lesions. CT ABDOMEN PELVIS FINDINGS Hepatobiliary: Normal liver with no liver mass. Cholelithiasis. Stable nonspecific wall thickening in the fundal portion of the gallbladder. No pericholecystic fluid. No biliary ductal dilatation. Pancreas: Normal, with no mass or duct dilation. Spleen: Normal size. No mass. Adrenals/Urinary Tract: No discrete adrenal nodules. No hydronephrosis. Simple 2.4 cm lateral lower right renal cyst. Several subcentimeter hypodense renal cortical lesions in both kidneys are too small to characterize. Bladder is completely collapsed by indwelling Foley catheter. New 18 x 10 mm calcification at the right posterior bladder, presumably a bladder stone. Stomach/Bowel: Normal non-distended stomach. Normal caliber small bowel with no small bowel wall thickening. Normal appendix. Mild  sigmoid diverticulosis, with no large bowel wall thickening or pericolonic fat stranding. Vascular/Lymphatic: Atherosclerotic abdominal aorta with stable 3.0 cm infrarenal abdominal aortic aneurysm. Patent portal, splenic, hepatic and renal veins. No pathologically enlarged lymph nodes in the abdomen or pelvis. Reproductive: Stable moderately enlarged prostate. Other: No pneumoperitoneum, ascites or focal fluid collection. Musculoskeletal: No aggressive appearing focal osseous lesions. 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 IMPRESSION: 1. Infiltrative partially necrotic central left lung tumor is overall stable in size since 04/30/2017 chest CT. Persistent occlusion of the central left lung airways with complete left lower lobe atelectasis and near complete left upper lobe scarring/atelectasis. 2. Moderate left hydropneumothorax is significantly decreased in size since 05/06/2017 chest CT with left PleurX catheter in place. Probable direct communication of the  necrotic left lower lobe portion of the tumor with the left pleural space. 3. Circumferential left pleural thickening and enhancement compatible with malignant effusion. 4. Findings worrisome for developing malignant stricture in the midthoracic esophagus, with new dilatation of the upper thoracic esophagus, which is filled with oral contrast and debris. Patient is at high risk of aspiration. 5. Otherwise no evidence of new or progressive metastatic disease in the chest. No evidence of metastatic disease in the abdomen, pelvis or skeleton. 6. Stable small pericardial effusion/thickening. 7. New 18 x 10 mm right posterior bladder calcification adjacent to the indwelling Foley catheter, presumably a bladder stone. No hydronephrosis. 8. Aortic Atherosclerosis (ICD10-I70.0) and Emphysema (ICD10-J43.9). Numerous chronic findings as above. These results will be called to the ordering clinician or representative by the Radiologist Assistant, and communication documented in the PACS or zVision Dashboard. Electronically Signed   By: Ilona Sorrel M.D.   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 Patient underwent first cycle of palliative chemotherapy 04/15/2017 with subsequent hospitalization for multilobar pneumonia. Patient presents for baseline restaging prior to resuming chemotherapy. EXAM: CT CHEST, ABDOMEN, AND PELVIS WITH CONTRAST TECHNIQUE: Multidetector CT imaging of the chest, abdomen and pelvis was performed following the standard protocol during bolus administration of intravenous contrast. CONTRAST:  22mL ISOVUE-300 IOPAMIDOL (ISOVUE-300) INJECTION 61% COMPARISON:  05/06/2017 unenhanced chest CT. 04/30/2017 IV contrast-enhanced chest CT. 03/23/2017 PET-CT. FINDINGS: CT CHEST FINDINGS Cardiovascular: Normal heart size. Stable small pericardial effusion/thickening. Atherosclerotic nonaneurysmal  thoracic aorta. Normal caliber main pulmonary artery. No central pulmonary emboli. Mediastinum/Nodes: No discrete thyroid nodules. The upper thoracic esophagus is newly mildly dilated and filled with oral contrast and debris, with an apparent esophageal caliber transition at the level of the infiltrative central left lung tumor, which appears to encase/invade the thoracic esophagus (series 2/image 34). No axillary adenopathy. No right hilar adenopathy. No discrete pathologically enlarged mediastinal nodes. Lungs/Pleura: There is prominent volume loss in the left hemithorax. There is a moderate left hydropneumothorax with circumferential left pleural thickening and enhancement, with basilar left PleurX catheter well positioned with the tip in posterior mid to upper left pleural space. The left hydropneumothorax is overall significantly decreased in size since 05/06/2017 chest CT. Infiltrative poorly marginated partially necrotic heterogeneously enhancing central left lung mass measures approximately 10.0 x 6.4 cm (series 2/image 36), overall not appreciably changed in size from 04/30/2017 chest CT, where it measured 10.9 x 6.6 cm. The central left lung mass directly invades the left hilum and subcarinal mediastinum. Occlusion of the left upper and left lower lobe bronchi by the mass, with complete left lower lobe atelectasis and limited  aeration of the left upper lobe, with bandlike opacities throughout the largely collapsed left upper lobe, compatible with a combination of atelectasis and postinfectious scarring. The necrotic left lower lobe portion of the tumor appears to directly communicate with the left pleural space (series 2/images 41 and 42). No right pleural effusion. Moderate centrilobular and paraseptal emphysema with diffuse bronchial wall thickening. Stable calcified subcentimeter medial right lower lobe granuloma. No acute consolidative airspace disease or significant pulmonary nodules in the right  lung. Musculoskeletal:  No aggressive appearing focal osseous lesions. CT ABDOMEN PELVIS FINDINGS Hepatobiliary: Normal liver with no liver mass. Cholelithiasis. Stable nonspecific wall thickening in the fundal portion of the gallbladder. No pericholecystic fluid. No biliary ductal dilatation. Pancreas: Normal, with no mass or duct dilation. Spleen: Normal size. No mass. Adrenals/Urinary Tract: No discrete adrenal nodules. No hydronephrosis. Simple 2.4 cm lateral lower right renal cyst. Several subcentimeter hypodense renal cortical lesions in both kidneys are too small to characterize. Bladder is completely collapsed by indwelling Foley catheter. New 18 x 10 mm calcification at the right posterior bladder, presumably a bladder stone. Stomach/Bowel: Normal non-distended stomach. Normal caliber small bowel with no small bowel wall thickening. Normal appendix. Mild sigmoid diverticulosis, with no large bowel wall thickening or pericolonic fat stranding. Vascular/Lymphatic: Atherosclerotic abdominal aorta with stable 3.0 cm infrarenal abdominal aortic aneurysm. Patent portal, splenic, hepatic and renal veins. No pathologically enlarged lymph nodes in the abdomen or pelvis. Reproductive: Stable moderately enlarged prostate. Other: No pneumoperitoneum, ascites or focal fluid collection. Musculoskeletal: No aggressive appearing focal osseous lesions. 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 IMPRESSION: 1. Infiltrative partially necrotic central left lung tumor is overall stable in size since 04/30/2017 chest CT. Persistent occlusion of the central left lung airways with complete left lower lobe atelectasis and near complete left upper lobe scarring/atelectasis. 2. Moderate left hydropneumothorax is significantly decreased in size since 05/06/2017 chest CT with left PleurX catheter in place. Probable direct communication of the necrotic left lower lobe portion of the tumor with the left pleural space. 3.  Circumferential left pleural thickening and enhancement compatible with malignant effusion. 4. Findings worrisome for developing malignant stricture in the midthoracic esophagus, with new dilatation of the upper thoracic esophagus, which is filled with oral contrast and debris. Patient is at high risk of aspiration. 5. Otherwise no evidence of new or progressive metastatic disease in the chest. No evidence of metastatic disease in the abdomen, pelvis or skeleton. 6. Stable small pericardial effusion/thickening. 7. New 18 x 10 mm right posterior bladder calcification adjacent to the indwelling Foley catheter, presumably a bladder stone. No hydronephrosis. 8. Aortic Atherosclerosis (ICD10-I70.0) and Emphysema (ICD10-J43.9). Numerous chronic findings as above. These results will be called to the ordering clinician or representative by the Radiologist Assistant, and communication documented in the PACS or zVision Dashboard. Electronically Signed   By: Ilona Sorrel M.D.   On: 07/14/2017 15:19   Dg Chest Port 1 View  Result Date: 07/18/2017 CLINICAL DATA:  Acute onset shortness of breath. Current history of left lung cancer. EXAM: PORTABLE CHEST 1 VIEW COMPARISON:  07/17/2017, 05/21/2017 and earlier, including CT chest 07/14/2017 and earlier. FINDINGS: Near complete opacification of the left hemithorax due to a combination of previously identified central lung cancer, obstructive atelectasis/pneumonitis involving the left lung and a large loculated pleural effusion (as seen on the prior CT). PleurX catheter in the medial left pleural space. Right lung hyperinflated with prominent bronchovascular markings diffusely and central peribronchial thickening, unchanged. No new abnormalities in  the right lung. IMPRESSION: 1. Stable appearance of the nearly completely opacified left hemithorax due to a combination of central malignancy, postobstructive atelectasis/pneumonitis and loculated pleural effusion. PleurX catheter  remains in place medially. 2. No acute abnormality involving the right lung.  COPD/emphysema. Electronically Signed   By: Evangeline Dakin M.D.   On: 07/18/2017 11:41   Dg Chest Port 1 View  Result Date: 07/17/2017 CLINICAL DATA:  77 year old male with stage IV lung cancer diagnosed November 2018. Shortness of breath. EXAM: PORTABLE CHEST 1 VIEW COMPARISON:  Restaging CT chest abdomen and pelvis 07/14/2017 and earlier. FINDINGS: Two portable AP semi upright views at 1745 hours. Progressive opacification of the left hemithorax since 05/21/2017, although the left lung ventilation appears only mildly worsened since 07/14/2017. There is subtotal opacification of the left hemithorax now. A percutaneous pleural catheter remains in place. Leftward shift of the mediastinum may have increased along with the opacification of the residual left perihilar lung since 07/14/2017. The left mainstem bronchus is poorly visible. The right lung appears hyperinflated and clear. Visualized tracheal air column is within normal limits. IMPRESSION: 1. Subtotal opacification of the left hemithorax now, with progressive opacification of the small area of residual pneumatized lung since the recent restaging CT 07/14/2017. Given the recent CT appearance, this might reflect a combination of progressive left bronchial compression due to tumor and/or mucous plugging. 2. Stable left pleural drain. 3. Hyperinflated right lung. Electronically Signed   By: Genevie Ann M.D.   On: 07/17/2017 18:32     CBC Recent Labs  Lab 07/17/17 1750  WBC 11.1*  HGB 10.0*  HCT 33.9*  PLT 345  MCV 91.9  MCH 27.1  MCHC 29.5*  RDW 16.0*  LYMPHSABS 0.6*  MONOABS 0.5  EOSABS 0.2  BASOSABS 0.0    Chemistries  Recent Labs  Lab 07/17/17 1750  NA 139  K 4.4  CL 102  CO2 33*  GLUCOSE 124*  BUN 24*  CREATININE 0.64  CALCIUM 8.6*  AST 15  ALT 20  ALKPHOS 129*  BILITOT 0.4    ------------------------------------------------------------------------------------------------------------------ No results for input(s): CHOL, HDL, LDLCALC, TRIG, CHOLHDL, LDLDIRECT in the last 72 hours.  Lab Results  Component Value Date   HGBA1C 7.4 05/22/2017   ------------------------------------------------------------------------------------------------------------------ No results for input(s): TSH, T4TOTAL, T3FREE, THYROIDAB in the last 72 hours.  Invalid input(s): FREET3 ------------------------------------------------------------------------------------------------------------------ No results for input(s): VITAMINB12, FOLATE, FERRITIN, TIBC, IRON, RETICCTPCT in the last 72 hours.  Coagulation profile No results for input(s): INR, PROTIME in the last 168 hours.  No results for input(s): DDIMER in the last 72 hours.  Cardiac Enzymes No results for input(s): CKMB, TROPONINI, MYOGLOBIN in the last 168 hours.  Invalid input(s): CK ------------------------------------------------------------------------------------------------------------------    Component Value Date/Time   BNP 440.6 (H) 04/24/2017 2227     Roxan Hockey M.D on 07/18/2017 at 6:16 PM  Between 7am to 7pm - Pager - [phone]  After 7pm go to [email or web address] - password [email or web address]  [facility] -  Office  [phone]   Voice Recognition Viviann Spare dictation system was used to create this note, attempts have been made to correct errors. Please contact the author with questions and/or clarifications.

## 2017-07-18 NOTE — Significant Event (Addendum)
Rapid Response Event Note  Overview: Time Called: 0930 Arrival Time: 0932 Event Type: Respiratory  Notified by Respiratory Therapist Megan in regards to patient respiratory status declining. Upon arrival, patient was sitting in chair, utilizing accessory muscles, increase work of breathing. Patient is alert and able to follow commands.   Initial Focused Assessment: Neuro: Patient is alert and oriented x4, pupils 2+ equal and reactive to light. Patient has purposeful movements in all extremities. No pain at this time noted. Patient able to follow commands.  Cardiac: ST, S1 and S2 heard upon auscultation, pulses 1+ in radial and pedal locations bilaterally.  Pulmonary: breath sounds rhonchus in all lung fields, diminished on left more so than right side. Patient has pleurx catheter but not accessed on left side. Patient utilizing accessory muscles to breathe. Megan J. RRT placed patient on NRB. Patient desaturating on 6L [place] prior to into the 70s. Patient currently saturating 85-89% on NRB. RR 30s-40s.   Interventions: MD Paged to come to bedside Placed order for ABG.  Placed patient back from chair to bed. MD came to bedside- recommended MD to transfer to SDU and order for chest x-ray and critical care consult.  Patient transferred to ICU/SD.  Plan of Care (if not transferred):       Maurice Tyler

## 2017-07-19 DIAGNOSIS — Z7189 Other specified counseling: Secondary | ICD-10-CM

## 2017-07-19 DIAGNOSIS — Z515 Encounter for palliative care: Secondary | ICD-10-CM

## 2017-07-19 LAB — BASIC METABOLIC PANEL
Anion gap: 9 (ref 5–15)
BUN: 21 mg/dL — AB (ref 6–20)
CO2: 28 mmol/L (ref 22–32)
CREATININE: 0.59 mg/dL — AB (ref 0.61–1.24)
Calcium: 8.5 mg/dL — ABNORMAL LOW (ref 8.9–10.3)
Chloride: 104 mmol/L (ref 101–111)
Glucose, Bld: 152 mg/dL — ABNORMAL HIGH (ref 65–99)
POTASSIUM: 4.4 mmol/L (ref 3.5–5.1)
SODIUM: 141 mmol/L (ref 135–145)

## 2017-07-19 LAB — GLUCOSE, CAPILLARY
GLUCOSE-CAPILLARY: 136 mg/dL — AB (ref 65–99)
GLUCOSE-CAPILLARY: 122 mg/dL — AB (ref 65–99)

## 2017-07-19 LAB — CBC
HCT: 32.5 % — ABNORMAL LOW (ref 39.0–52.0)
HEMOGLOBIN: 9.3 g/dL — AB (ref 13.0–17.0)
MCH: 26.6 pg (ref 26.0–34.0)
MCHC: 28.6 g/dL — AB (ref 30.0–36.0)
MCV: 93.1 fL (ref 78.0–100.0)
PLATELETS: 344 10*3/uL (ref 150–400)
RBC: 3.49 MIL/uL — AB (ref 4.22–5.81)
RDW: 16.2 % — ABNORMAL HIGH (ref 11.5–15.5)
WBC: 10 10*3/uL (ref 4.0–10.5)

## 2017-07-19 MED ORDER — BOOST / RESOURCE BREEZE PO LIQD CUSTOM
1.0000 | Freq: Three times a day (TID) | ORAL | Status: DC
  Administered 2017-07-19: 1 via ORAL

## 2017-07-19 MED ORDER — HALOPERIDOL 0.5 MG PO TABS: 0.5000 mg | ORAL_TABLET | ORAL | Status: DC | PRN

## 2017-07-19 MED ORDER — IPRATROPIUM-ALBUTEROL 0.5-2.5 (3) MG/3ML IN SOLN
3.0000 mL | Freq: Three times a day (TID) | RESPIRATORY_TRACT | Status: DC
  Administered 2017-07-20: 3 mL via RESPIRATORY_TRACT
  Filled 2017-07-19: qty 3

## 2017-07-19 MED ORDER — MORPHINE SULFATE (PF) 4 MG/ML IV SOLN: 1.0000 mg | INTRAVENOUS | Status: DC | PRN

## 2017-07-19 MED ORDER — GLYCOPYRROLATE 1 MG PO TABS: 1.0000 mg | ORAL_TABLET | ORAL | Status: DC | PRN

## 2017-07-19 MED ORDER — HALOPERIDOL LACTATE 2 MG/ML PO CONC
0.5000 mg | ORAL | Status: DC | PRN
  Filled 2017-07-19: qty 0.3

## 2017-07-19 MED ORDER — LORAZEPAM 1 MG PO TABS: 1.0000 mg | ORAL_TABLET | ORAL | Status: DC | PRN

## 2017-07-19 MED ORDER — LORAZEPAM 2 MG/ML IJ SOLN: 1.0000 mg | INTRAMUSCULAR | Status: DC | PRN

## 2017-07-19 MED ORDER — HALOPERIDOL LACTATE 5 MG/ML IJ SOLN: 0.5000 mg | INTRAMUSCULAR | Status: DC | PRN

## 2017-07-19 MED ORDER — GLYCOPYRROLATE 0.2 MG/ML IJ SOLN: 0.2000 mg | INTRAMUSCULAR | Status: DC | PRN

## 2017-07-19 MED ORDER — LORAZEPAM 2 MG/ML PO CONC: 1.0000 mg | ORAL | Status: DC | PRN

## 2017-07-19 MED ORDER — DEXTROSE-NACL 5-0.45 % IV SOLN
INTRAVENOUS | Status: DC
  Administered 2017-07-19: 18:00:00 via INTRAVENOUS
  Administered 2017-07-20: 1000 mL via INTRAVENOUS
  Administered 2017-07-22: 10:00:00 via INTRAVENOUS

## 2017-07-19 MED ORDER — POLYVINYL ALCOHOL 1.4 % OP SOLN
1.0000 [drp] | Freq: Four times a day (QID) | OPHTHALMIC | Status: DC | PRN
  Filled 2017-07-19: qty 15

## 2017-07-19 MED ORDER — GLYCOPYRROLATE 0.2 MG/ML IJ SOLN
0.2000 mg | INTRAMUSCULAR | Status: DC | PRN
Start: 2017-07-19 — End: 2017-07-23

## 2017-07-19 MED ORDER — BIOTENE DRY MOUTH MT LIQD: 15.0000 mL | OROMUCOSAL | Status: DC | PRN

## 2017-07-19 NOTE — Consult Note (Signed)
Palliative care consult note  Reason for consult: Goals of care in light of non-small cell lung cancer   Chart reviewed and discussed case with Dr. Lake Bells, Dr. Denton Brick, and bedside RN.  Briefly, Maurice Tyler is a 77 year old male with non-small cell lung cancer further complicated by pneumonia with empyema.  He has subsequently had chest tube placed for lung entrapment.  Dr. Lake Bells met with him earlier today and they discussed the fact that he is not likely to survive this hospitalization.  I met today with Maurice Tyler.  He talked with me about his disease course and the fact that he understands that we are approaching the end of his life.  He states to me that he did not want to suffer as he approaches the end of his life, however, it makes him nervous whenever he thinks about the fact that he is dying.  He therefore requested to focus only on things that we can do rather than focus on things that cannot be changed.  We discussed that overall goal is for comfort, however, he is concerned about stopping all interventions suddenly without having a chance for me to discuss further with his wife.  We agreed with the plan to begin to discontinue interventions that are not related to his comfort (we specifically discussed no for the lab work, no further glucose checks, no further insulin , and no further injections for DVT prophylaxis).  We also discussed the goal being to be free of pain, anxiety, shortness of breath, agitation, nausea, and being able to sleep well.  I discussed with him plan to add medications for this as needed and we discussed that they can be increased in both frequency and dose if needed to ensure that he is comfortable as he approaches end of life.  He would like me to come back tomorrow and meet when his wife is also present.  He will asked nurse to page me when she arrives in the morning.  Total time: 60 minutes Greater than 50%  of this time was spent counseling and  coordinating care related to the above assessment and plan.  Micheline Rough, MD [facility] [phone]

## 2017-07-19 NOTE — Progress Notes (Signed)
Patient Demographics:    Maurice Tyler, is a 77 y.o. male, DOB - 1941-02-13, ZOX:[id number]  Admit date - 07/17/2017   Admitting Physician Ivor Costa, MD  Outpatient Primary MD for the patient is [name], MD  LOS - 1   Chief Complaint  Patient presents with  . 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)    CXR showed 1. Subtotal opacification of the left hemithorax now, with progressive opacification of the small area of residual pneumatized lung since the recent restaging CT 07/14/2017. Given the recent CT appearance, this might reflect a combination of progressive left bronchial compression due to tumor and/or mucous plugging. 2. Stable left pleural drain. 3. Hyperinflated right lung.    Plan:- 1) acute on chronic hypoxic respiratory failure- patient with near total opacification of the left lung, please see chest x-ray report above, discussed with Dr. Lake Bells from [facility] service who is familiar with patient, official pulmonology consult appreciated, on 07/19/17 after conversations with Dr. Lake Bells patient is a decided to be comfort measures only please see progress note from Dr. Simonne Maffucci dated 07/19/2017 , c/n IV Solu-Medrol, STOP drainage of Lt Pleurx Catheter with Conception device for now, may resume drainage  as needed if respiratory distress not relieved by morphine sulfate continue bronchodilators.     2)Social/Ethics-patient is now a DNR/DNI, fter conversations with Dr. Lake Bells patient is a decided to be comfort measures only please see progress note from Dr. 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 Earlie Server, discussed with Dr. Lebron Conners on 07/18/2017, official oncology consult appreciated, fter conversations with Dr. Lake Bells patient is a decided to be comfort measures only please see progress note from Dr. Simonne Maffucci dated 07/19/2017   4)DM-anticipate worsening glycemic control due to steroids last A1c was 7.4, Use Novolog/Humalog Sliding scale insulin with Accu-Cheks/Fingersticks as ordered   5)Afib-  chadsvasc score is 3, patient is currently not on anticoagulation due to complex comorbid condition and poor prognosis, continue amiodarone  Code Status : DNR/DNI  Disposition Plan  : fter conversations with Dr. Lake Bells patient is a decided to be comfort measures only please see progress note from Dr. Simonne Maffucci dated 07/19/2017 , possible transfer to [facility]  Consults  :  PCCM/Palliative   Lab Results  Component Value Date   PLT 344 07/19/2017    Inpatient Medications  Scheduled Meds: .  amiodarone  400 mg Oral BID  . arformoterol  15 mcg Nebulization BID  . budesonide (PULMICORT) nebulizer solution  0.5 mg Nebulization BID  . chlorhexidine  15 mL Mouth Rinse BID  . clotrimazole  10 mg Oral 5 X Daily  . feeding supplement  1 Container Oral TID BM  . guaiFENesin  1,200 mg Oral BID  . ipratropium-albuterol  3 mL Nebulization Q6H  . loratadine  10 mg Oral Daily  . mouth rinse  15 mL Mouth Rinse q12n4p  . methylPREDNISolone (SOLU-MEDROL) injection  60 mg  Intravenous Q12H  . sodium chloride HYPERTONIC  4 mL Nebulization BID  . tamsulosin  0.4 mg Oral Daily   Continuous Infusions: . sodium chloride 75 mL/hr at 07/19/17 1510   PRN Meds:.acetaminophen, albuterol, antiseptic oral rinse, glycopyrrolate **OR** glycopyrrolate **OR** glycopyrrolate, haloperidol **OR** haloperidol **OR** haloperidol lactate, loperamide, LORazepam **OR** LORazepam **OR** LORazepam, menthol-cetylpyridinium, morphine injection, ondansetron, polyvinyl alcohol, prochlorperazine, zolpidem    Anti-infectives (From admission, onward)   Start     Dose/Rate Route Frequency Ordered Stop   07/17/17 2200  sulfamethoxazole-trimethoprim (BACTRIM DS,SEPTRA DS) 800-160 MG per tablet 1 tablet  Status:  Discontinued     1 tablet Oral 2 times daily 07/17/17 2040 07/17/17 2128        Objective:   Vitals:   07/19/17 0900 07/19/17 0910 07/19/17 1200 07/19/17 1238  BP: (!) 92/50  (!) 86/49   Pulse: (!) 102 99 94 (!) 109  Resp: (!) 24 20 (!) 23 (!) 22  Temp:   (!) 97.4 F (36.3 C)   TempSrc:   Oral   SpO2: 94% 95% 95% 92%  Weight:      Height:        Wt Readings from Last 3 Encounters:  07/17/17 58.1 kg (128 lb 1.4 oz)  07/17/17 61.1 kg (134 lb 9.6 oz)  07/10/17 61.1 kg (134 lb 9.6 oz)     Intake/Output Summary (Last 24 hours) at 07/19/2017 1720 Last data filed at 07/19/2017 0854 Gross per 24 hour  Intake 1920 ml  Output 985 ml  Net 935 ml     Physical Exam  Gen:- Awake Alert, acute distress,  HEENT:- [place].AT, No sclera icterus Nose- [place] 4 L/min Neck-Supple Neck,No JVD,.  Lungs- very diminished on the left, Pleurx catheter noted  CV- S1, S2 normal Abd-  +ve B.Sounds, Abd Soft, No tenderness,    Extremity/Skin:- No  edema,  warm Psych-affect is appropriate, oriented x3 Neuro-no new focal deficits, no tremors   Data Review:   Micro Results Recent Results (from the past 240 hour(s))  Culture, blood (x 2)     Status: None (Preliminary result)   Collection  Time: 07/17/17  9:02 PM  Result Value Ref Range Status   Specimen Description   Final    BLOOD RIGHT HAND Performed at [facility] [address], [place], [place] [address]    Special Requests   Final    BOTTLES DRAWN AEROBIC AND ANAEROBIC Blood Culture adequate volume Performed at [facility] [address] [place], [place] [address]    Culture   Final    NO GROWTH 2 DAYS Performed at [facility] [address], [place], [place] [address]    Report Status PENDING  Incomplete  Culture, blood (x 2)     Status: None (Preliminary result)   Collection Time: 07/17/17  9:09 PM  Result Value Ref Range Status   Specimen Description   Final  BLOOD LEFT HAND Performed at [facility] [address], [place], [place] [address]    Special Requests   Final    BOTTLES DRAWN AEROBIC AND ANAEROBIC Blood Culture adequate volume Performed at [facility] [address], [place], [place] [address]    Culture   Final    NO GROWTH 2 DAYS Performed at [facility] [address] [place], [place] [address]    Report Status PENDING  Incomplete  MRSA PCR Screening     Status: None   Collection Time: 07/17/17 10:48 PM  Result Value Ref Range Status   MRSA by PCR NEGATIVE NEGATIVE Final    Comment:        The GeneXpert MRSA Assay (FDA approved for NASAL specimens only), is one component of a comprehensive MRSA colonization surveillance program. It is not intended to diagnose MRSA infection nor to guide or monitor treatment for MRSA infections. Performed at [facility], [place] [address] [place], [place] [address]     Radiology Reports Ct Chest W Contrast  Result Date: 07/14/2017 CLINICAL DATA:  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 Patient underwent first cycle of palliative chemotherapy 04/15/2017 with subsequent hospitalization for multilobar  pneumonia. Patient presents for baseline restaging prior to resuming chemotherapy. EXAM: CT CHEST, ABDOMEN, AND PELVIS WITH CONTRAST TECHNIQUE: Multidetector CT imaging of the chest, abdomen and pelvis was performed following the standard protocol during bolus administration of intravenous contrast. CONTRAST:  88mL ISOVUE-300 IOPAMIDOL (ISOVUE-300) INJECTION 61% COMPARISON:  05/06/2017 unenhanced chest CT. 04/30/2017 IV contrast-enhanced chest CT. 03/23/2017 PET-CT. FINDINGS: CT CHEST FINDINGS Cardiovascular: Normal heart size. Stable small pericardial effusion/thickening. Atherosclerotic nonaneurysmal thoracic aorta. Normal caliber main pulmonary artery. No central pulmonary emboli. Mediastinum/Nodes: No discrete thyroid nodules. The upper thoracic esophagus is newly mildly dilated and filled with oral contrast and debris, with an apparent esophageal caliber transition at the level of the infiltrative central left lung tumor, which appears to encase/invade the thoracic esophagus (series 2/image 34). No axillary adenopathy. No right hilar adenopathy. No discrete pathologically enlarged mediastinal nodes. Lungs/Pleura: There is prominent volume loss in the left hemithorax. There is a moderate left hydropneumothorax with circumferential left pleural thickening and enhancement, with basilar left PleurX catheter well positioned with the tip in posterior mid to upper left pleural space. The left hydropneumothorax is overall significantly decreased in size since 05/06/2017 chest CT. Infiltrative poorly marginated partially necrotic heterogeneously enhancing central left lung mass measures approximately 10.0 x 6.4 cm (series 2/image 36), overall not appreciably changed in size from 04/30/2017 chest CT, where it measured 10.9 x 6.6 cm. The central left lung mass directly invades the left hilum and subcarinal mediastinum. Occlusion of the left upper and left lower lobe bronchi by the mass, with complete left lower lobe  atelectasis and limited aeration of the left upper lobe, with bandlike opacities throughout the largely collapsed left upper lobe, compatible with a combination of atelectasis and postinfectious scarring. The necrotic left lower lobe portion of the tumor appears to directly communicate with the left pleural space (series 2/images 41 and 42). No right pleural effusion. Moderate centrilobular and paraseptal emphysema with diffuse bronchial wall thickening. Stable calcified subcentimeter medial right lower lobe granuloma. No acute consolidative airspace disease or significant pulmonary nodules in the right lung. Musculoskeletal:  No aggressive appearing focal osseous lesions. CT ABDOMEN PELVIS FINDINGS Hepatobiliary: Normal liver with no liver mass. Cholelithiasis. Stable nonspecific wall thickening in the fundal portion of  the gallbladder. No pericholecystic fluid. No biliary ductal dilatation. Pancreas: Normal, with no mass or duct dilation. Spleen: Normal size. No mass. Adrenals/Urinary Tract: No discrete adrenal nodules. No hydronephrosis. Simple 2.4 cm lateral lower right renal cyst. Several subcentimeter hypodense renal cortical lesions in both kidneys are too small to characterize. Bladder is completely collapsed by indwelling Foley catheter. New 18 x 10 mm calcification at the right posterior bladder, presumably a bladder stone. Stomach/Bowel: Normal non-distended stomach. Normal caliber small bowel with no small bowel wall thickening. Normal appendix. Mild sigmoid diverticulosis, with no large bowel wall thickening or pericolonic fat stranding. Vascular/Lymphatic: Atherosclerotic abdominal aorta with stable 3.0 cm infrarenal abdominal aortic aneurysm. Patent portal, splenic, hepatic and renal veins. No pathologically enlarged lymph nodes in the abdomen or pelvis. Reproductive: Stable moderately enlarged prostate. Other: No pneumoperitoneum, ascites or focal fluid collection. Musculoskeletal: No aggressive  appearing focal osseous lesions. 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 IMPRESSION: 1. Infiltrative partially necrotic central left lung tumor is overall stable in size since 04/30/2017 chest CT. Persistent occlusion of the central left lung airways with complete left lower lobe atelectasis and near complete left upper lobe scarring/atelectasis. 2. Moderate left hydropneumothorax is significantly decreased in size since 05/06/2017 chest CT with left PleurX catheter in place. Probable direct communication of the necrotic left lower lobe portion of the tumor with the left pleural space. 3. Circumferential left pleural thickening and enhancement compatible with malignant effusion. 4. Findings worrisome for developing malignant stricture in the midthoracic esophagus, with new dilatation of the upper thoracic esophagus, which is filled with oral contrast and debris. Patient is at high risk of aspiration. 5. Otherwise no evidence of new or progressive metastatic disease in the chest. No evidence of metastatic disease in the abdomen, pelvis or skeleton. 6. Stable small pericardial effusion/thickening. 7. New 18 x 10 mm right posterior bladder calcification adjacent to the indwelling Foley catheter, presumably a bladder stone. No hydronephrosis. 8. Aortic Atherosclerosis (ICD10-I70.0) and Emphysema (ICD10-J43.9). Numerous chronic findings as above. These results will be called to the ordering clinician or representative by the Radiologist Assistant, and communication documented in the PACS or zVision Dashboard. Electronically Signed   By: Ilona Sorrel M.D.   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 Patient underwent first cycle of palliative chemotherapy 04/15/2017 with subsequent hospitalization for multilobar pneumonia. Patient presents for baseline restaging prior to resuming  chemotherapy. EXAM: CT CHEST, ABDOMEN, AND PELVIS WITH CONTRAST TECHNIQUE: Multidetector CT imaging of the chest, abdomen and pelvis was performed following the standard protocol during bolus administration of intravenous contrast. CONTRAST:  46mL ISOVUE-300 IOPAMIDOL (ISOVUE-300) INJECTION 61% COMPARISON:  05/06/2017 unenhanced chest CT. 04/30/2017 IV contrast-enhanced chest CT. 03/23/2017 PET-CT. FINDINGS: CT CHEST FINDINGS Cardiovascular: Normal heart size. Stable small pericardial effusion/thickening. Atherosclerotic nonaneurysmal thoracic aorta. Normal caliber main pulmonary artery. No central pulmonary emboli. Mediastinum/Nodes: No discrete thyroid nodules. The upper thoracic esophagus is newly mildly dilated and filled with oral contrast and debris, with an apparent esophageal caliber transition at the level of the infiltrative central left lung tumor, which appears to encase/invade the thoracic esophagus (series 2/image 34). No axillary adenopathy. No right hilar adenopathy. No discrete pathologically enlarged mediastinal nodes. Lungs/Pleura: There is prominent volume loss in the left hemithorax. There is a moderate left hydropneumothorax with circumferential left pleural thickening and enhancement, with basilar left PleurX catheter well positioned with the tip  in posterior mid to upper left pleural space. The left hydropneumothorax is overall significantly decreased in size since 05/06/2017 chest CT. Infiltrative poorly marginated partially necrotic heterogeneously enhancing central left lung mass measures approximately 10.0 x 6.4 cm (series 2/image 36), overall not appreciably changed in size from 04/30/2017 chest CT, where it measured 10.9 x 6.6 cm. The central left lung mass directly invades the left hilum and subcarinal mediastinum. Occlusion of the left upper and left lower lobe bronchi by the mass, with complete left lower lobe atelectasis and limited aeration of the left upper lobe, with bandlike  opacities throughout the largely collapsed left upper lobe, compatible with a combination of atelectasis and postinfectious scarring. The necrotic left lower lobe portion of the tumor appears to directly communicate with the left pleural space (series 2/images 41 and 42). No right pleural effusion. Moderate centrilobular and paraseptal emphysema with diffuse bronchial wall thickening. Stable calcified subcentimeter medial right lower lobe granuloma. No acute consolidative airspace disease or significant pulmonary nodules in the right lung. Musculoskeletal:  No aggressive appearing focal osseous lesions. CT ABDOMEN PELVIS FINDINGS Hepatobiliary: Normal liver with no liver mass. Cholelithiasis. Stable nonspecific wall thickening in the fundal portion of the gallbladder. No pericholecystic fluid. No biliary ductal dilatation. Pancreas: Normal, with no mass or duct dilation. Spleen: Normal size. No mass. Adrenals/Urinary Tract: No discrete adrenal nodules. No hydronephrosis. Simple 2.4 cm lateral lower right renal cyst. Several subcentimeter hypodense renal cortical lesions in both kidneys are too small to characterize. Bladder is completely collapsed by indwelling Foley catheter. New 18 x 10 mm calcification at the right posterior bladder, presumably a bladder stone. Stomach/Bowel: Normal non-distended stomach. Normal caliber small bowel with no small bowel wall thickening. Normal appendix. Mild sigmoid diverticulosis, with no large bowel wall thickening or pericolonic fat stranding. Vascular/Lymphatic: Atherosclerotic abdominal aorta with stable 3.0 cm infrarenal abdominal aortic aneurysm. Patent portal, splenic, hepatic and renal veins. No pathologically enlarged lymph nodes in the abdomen or pelvis. Reproductive: Stable moderately enlarged prostate. Other: No pneumoperitoneum, ascites or focal fluid collection. Musculoskeletal: No aggressive appearing focal osseous lesions. Marked lumbar spondylosis with bilateral  L5 pars defects. IMPRESSION: 1. Infiltrative partially necrotic central left lung tumor is overall stable in size since 04/30/2017 chest CT. Persistent occlusion of the central left lung airways with complete left lower lobe atelectasis and near complete left upper lobe scarring/atelectasis. 2. Moderate left hydropneumothorax is significantly decreased in size since 05/06/2017 chest CT with left PleurX catheter in place. Probable direct communication of the necrotic left lower lobe portion of the tumor with the left pleural space. 3. Circumferential left pleural thickening and enhancement compatible with malignant effusion. 4. Findings worrisome for developing malignant stricture in the midthoracic esophagus, with new dilatation of the upper thoracic esophagus, which is filled with oral contrast and debris. Patient is at high risk of aspiration. 5. Otherwise no evidence of new or progressive metastatic disease in the chest. No evidence of metastatic disease in the abdomen, pelvis or skeleton. 6. Stable small pericardial effusion/thickening. 7. New 18 x 10 mm right posterior bladder calcification adjacent to the indwelling Foley catheter, presumably a bladder stone. No hydronephrosis. 8. Aortic Atherosclerosis (ICD10-I70.0) and Emphysema (ICD10-J43.9). Numerous chronic findings as above. These results will be called to the ordering clinician or representative by the Radiologist Assistant, and communication documented in the PACS or zVision Dashboard. Electronically Signed   By: Ilona Sorrel M.D.   On: 07/14/2017 15:19   Dg Chest Port 1 View  Result Date: 07/18/2017  CLINICAL DATA:  Acute onset shortness of breath. Current history of left lung cancer. EXAM: PORTABLE CHEST 1 VIEW COMPARISON:  07/17/2017, 05/21/2017 and earlier, including CT chest 07/14/2017 and earlier. FINDINGS: Near complete opacification of the left hemithorax due to a combination of previously identified central lung cancer, obstructive  atelectasis/pneumonitis involving the left lung and a large loculated pleural effusion (as seen on the prior CT). PleurX catheter in the medial left pleural space. Right lung hyperinflated with prominent bronchovascular markings diffusely and central peribronchial thickening, unchanged. No new abnormalities in the right lung. IMPRESSION: 1. Stable appearance of the nearly completely opacified left hemithorax due to a combination of central malignancy, postobstructive atelectasis/pneumonitis and loculated pleural effusion. PleurX catheter remains in place medially. 2. No acute abnormality involving the right lung.  COPD/emphysema. Electronically Signed   By: Evangeline Dakin M.D.   On: 07/18/2017 11:41   Dg Chest Port 1 View  Result Date: 07/17/2017 CLINICAL DATA:  77 year old male with stage IV lung cancer diagnosed November 2018. Shortness of breath. EXAM: PORTABLE CHEST 1 VIEW COMPARISON:  Restaging CT chest abdomen and pelvis 07/14/2017 and earlier. FINDINGS: Two portable AP semi upright views at 1745 hours. Progressive opacification of the left hemithorax since 05/21/2017, although the left lung ventilation appears only mildly worsened since 07/14/2017. There is subtotal opacification of the left hemithorax now. A percutaneous pleural catheter remains in place. Leftward shift of the mediastinum may have increased along with the opacification of the residual left perihilar lung since 07/14/2017. The left mainstem bronchus is poorly visible. The right lung appears hyperinflated and clear. Visualized tracheal air column is within normal limits. IMPRESSION: 1. Subtotal opacification of the left hemithorax now, with progressive opacification of the small area of residual pneumatized lung since the recent restaging CT 07/14/2017. Given the recent CT appearance, this might reflect a combination of progressive left bronchial compression due to tumor and/or mucous plugging. 2. Stable left pleural drain. 3.  Hyperinflated right lung. Electronically Signed   By: Genevie Ann M.D.   On: 07/17/2017 18:32     CBC Recent Labs  Lab 07/17/17 1750 07/19/17 0822  WBC 11.1* 10.0  HGB 10.0* 9.3*  HCT 33.9* 32.5*  PLT 345 344  MCV 91.9 93.1  MCH 27.1 26.6  MCHC 29.5* 28.6*  RDW 16.0* 16.2*  LYMPHSABS 0.6*  --   MONOABS 0.5  --   EOSABS 0.2  --   BASOSABS 0.0  --     Chemistries  Recent Labs  Lab 07/17/17 1750 07/19/17 0822  NA 139 141  K 4.4 4.4  CL 102 104  CO2 33* 28  GLUCOSE 124* 152*  BUN 24* 21*  CREATININE 0.64 0.59*  CALCIUM 8.6* 8.5*  AST 15  --   ALT 20  --   ALKPHOS 129*  --   BILITOT 0.4  --    ------------------------------------------------------------------------------------------------------------------ No results for input(s): CHOL, HDL, LDLCALC, TRIG, CHOLHDL, LDLDIRECT in the last 72 hours.  Lab Results  Component Value Date   HGBA1C 7.4 05/22/2017   ------------------------------------------------------------------------------------------------------------------ No results for input(s): TSH, T4TOTAL, T3FREE, THYROIDAB in the last 72 hours.  Invalid input(s): FREET3 ------------------------------------------------------------------------------------------------------------------ No results for input(s): VITAMINB12, FOLATE, FERRITIN, TIBC, IRON, RETICCTPCT in the last 72 hours.  Coagulation profile No results for input(s): INR, PROTIME in the last 168 hours.  No results for input(s): DDIMER in the last 72 hours.  Cardiac Enzymes No results for input(s): CKMB, TROPONINI, MYOGLOBIN in the last 168 hours.  Invalid input(s): CK ------------------------------------------------------------------------------------------------------------------  Component Value Date/Time   BNP 440.6 (H) 04/24/2017 2227     Roxan Hockey M.D on 07/19/2017 at 5:20 PM  Between 7am to 7pm - Pager - [phone]  After 7pm go to [email or web address] - password [email or web address]  [facility] -  Office  [phone]   Voice Recognition Viviann Spare dictation system was used to create this note, attempts have been made to correct errors. Please contact the author with questions and/or clarifications.

## 2017-07-19 NOTE — Progress Notes (Signed)
Pt arrived on [facility] from ICU, wearing a rebreather mask @ 15%. Pt alert and oriented. Bed alarm on. Asked if he wanted to order something for dinner, he refused. Repositioned off back. Two foam dressings on  Sacrum and Left buttocks. Pt holding call light.

## 2017-07-19 NOTE — Progress Notes (Signed)
Patient requesting  to have chest tube removed from suction. Notified Dr. Lake Bells of patients request Dr. Lake Bells stated it was okay to remove chest tube from suction.

## 2017-07-19 NOTE — Progress Notes (Signed)
[facility]  Subjective: May be feels a little better today Did not get much fluid out with drainage  Objective: Vitals:   07/19/17 0900 07/19/17 0910 07/19/17 1200 07/19/17 1238  BP: (!) 92/50  (!) 86/49   Pulse: (!) 102 99 94 (!) 109  Resp: (!) 24 20 (!) 23 (!) 22  Temp:   (!) 97.4 F (36.3 C)   TempSrc:   Oral   SpO2: 94% 95% 95% 92%  Weight:      Height:       100% NRB  General:  Emaciated, resting comfortably in bed HENT: NCAT OP clear PULM: no breath sounds on L, clear R, normal effort CV: RRR, no mgr GI: BS+, soft, nontender MSK: normal bulk and tone Neuro: awake, alert, no distress, MAEW  Impression: 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 Holt asked me today how long he has got left.  I told him he will not survive this illness and I didn't think he would leave the hospital.  He asked me if we could do a pneumonectomy and I explained to him that he would die if we tried that.  He says that given the fact that he will not survive this he agrees with comfort measures.  He would like for [place] to make sure that he is not aware that he is dying any does not want to feel short of breath.  I assured him that we could do this.  Plan: Case discussed with Dr. Domingo Cocking of palliative medicine We will use morphine as needed for dyspnea Comfort measures only at this point If his illness is prolonged despite using narcotics for relief of dyspnea then the primary team could resume intermittent drainage of the left pleural effusion every third day.  Roselie Awkward, MD [facility] Pager: [phone] Cell: [phone] After 3pm or if no response, call [phone]

## 2017-07-19 NOTE — Progress Notes (Signed)
Chest removed from suction by Nunzio Cory, RN

## 2017-07-19 NOTE — Progress Notes (Signed)
Nutrition Brief Note  RD consulted for nutritional assessment.  Chart reviewed. Pt now transitioning to comfort care.  No further nutrition interventions warranted at this time.    Clayton Bibles, MS, RD, [facility] Pager: [phone] After Hours Pager: [phone]

## 2017-07-20 DIAGNOSIS — R0600 Dyspnea, unspecified: Secondary | ICD-10-CM

## 2017-07-20 NOTE — Progress Notes (Signed)
Patient Demographics:    Maurice Tyler, is a 77 y.o. male, DOB - [birth year], SMO:[id number]  Admit date - 07/17/2017   Admitting Physician Ivor Costa, MD  Outpatient Primary MD for the patient is Maurice Redwood, MD  LOS - 2   Chief Complaint  Patient presents with  . 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 Subtotal opacification of the left hemithorax now, with progressive opacification of the small area of residual pneumatized lung since the recent restaging CT 07/14/2017. Given the recent CT appearance, this might reflect a combination of progressive left bronchial compression due to tumor and/or mucous plugging. 2. Stable left pleural drain. 3. Hyperinflated right lung.    Plan:- 1) acute on chronic hypoxic respiratory failure- patient with near total opacification of the left lung, please see chest x-ray report above, discussed with Dr. Lake Bells from [facility] service who is familiar with patient, official pulmonology consult appreciated, on 07/19/17 after conversations with Dr. Lake Bells patient is a decided to be comfort measures only please see progress note from Dr. Simonne Maffucci dated 07/19/2017 , c/n IV Solu-Medrol, STOP drainage of Lt Pleurx Catheter with [place] device for now, may resume drainage as needed if respiratory  distress not relieved by morphine sulfate continue bronchodilators.     2)Social/Ethics-patient is now a DNR/DNI, after conversations with Dr. Lake Bells and further conversations with Dr. Domingo Cocking and patient's wife,  Patient and his wife has decided to be comfort measures only please see progress note from Dr. Simonne Maffucci dated 07/19/2017 and Dr Domingo Cocking on 07/20/17.   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 Earlie Server, discussed with Dr. Lebron Conners on 07/18/2017, official oncology consult appreciated, pt is comfort measures only   4)DM-anticipate worsening glycemic control due to steroids last A1c was 7.4, Use Novolog/Humalog Sliding scale insulin with Accu-Cheks/Fingersticks as ordered   5)Afib-  chadsvasc score is 3, patient is currently not on anticoagulation due to complex comorbid condition and poor prognosis, continue amiodarone, pt is comfort measures only    Code Status : DNR/DNI  Disposition Plan  :  Patient and his wife has decided to be comfort measures only please see progress note from Dr. Simonne Maffucci dated 07/19/2017 and Dr Domingo Cocking on 07/20/17.   Overall prognosis is poor/grave, patient may be a candidate for [facility]  Consults  :  PCCM/Palliative   Lab Results  Component  Value Date   PLT 344 07/19/2017    Inpatient Medications  Scheduled Meds: . amiodarone  400 mg Oral BID  . arformoterol  15 mcg Nebulization BID  . budesonide (PULMICORT) nebulizer solution  0.5 mg Nebulization BID  . chlorhexidine  15 mL Mouth Rinse BID  . clotrimazole  10 mg Oral 5 X Daily  . feeding supplement  1 Container Oral TID BM  . ipratropium-albuterol  3 mL Nebulization TID  . mouth rinse  15 mL Mouth Rinse q12n4p  . methylPREDNISolone  (SOLU-MEDROL) injection  60 mg Intravenous Q12H  . sodium chloride HYPERTONIC  4 mL Nebulization BID   Continuous Infusions: . dextrose 5 % and 0.45% NaCl 1,000 mL (07/20/17 1418)   PRN Meds:.acetaminophen, albuterol, antiseptic oral rinse, glycopyrrolate **OR** glycopyrrolate **OR** glycopyrrolate, haloperidol **OR** haloperidol **OR** haloperidol lactate, loperamide, LORazepam **OR** LORazepam **OR** LORazepam, menthol-cetylpyridinium, morphine injection, ondansetron, polyvinyl alcohol    Anti-infectives (From admission, onward)   Start     Dose/Rate Route Frequency Ordered Stop   07/17/17 2200  sulfamethoxazole-trimethoprim (BACTRIM DS,SEPTRA DS) 800-160 MG per tablet 1 tablet  Status:  Discontinued     1 tablet Oral 2 times daily 07/17/17 2040 07/17/17 2128        Objective:   Vitals:   07/19/17 1600 07/19/17 1832 07/19/17 2217 07/20/17 0532  BP: (!) 96/41 (!) 98/56 95/61 96/67   Pulse: 98 (!) 102 96 100  Resp: (!) 25 (!) 22 (!) 24 (!) 24  Temp:  98.1 F (36.7 C) (!) 97.5 F (36.4 C) 97.6 F (36.4 C)  TempSrc:  Axillary Axillary Axillary  SpO2: 100% 96% 100% 100%  Weight:      Height:        Wt Readings from Last 3 Encounters:  07/17/17 58.1 kg (128 lb 1.4 oz)  07/17/17 61.1 kg (134 lb 9.6 oz)  07/10/17 61.1 kg (134 lb 9.6 oz)     Intake/Output Summary (Last 24 hours) at 07/20/2017 1843 Last data filed at 07/20/2017 1037 Gross per 24 hour  Intake 1033.33 ml  Output 300 ml  Net 733.33 ml     Physical Exam  Gen:- Awake Alert, resting, looks chronically ill HEENT:- [place].AT, No sclera icterus Nose- NRB Neck-Supple Neck,No JVD,.  Lungs- very diminished on the left, Pleurx catheter noted  CV- S1, S2 normal Abd-  +ve B.Sounds, Abd Soft, No tenderness,    Extremity/Skin:- No  edema,  warm Psych-affect is appropriate, oriented x3 Neuro-no new focal deficits, no tremors   Data Review:   Micro Results Recent Results (from the past 240 hour(s))  Culture, blood (x  2)     Status: None (Preliminary result)   Collection Time: 07/17/17  9:02 PM  Result Value Ref Range Status   Specimen Description   Final    BLOOD RIGHT HAND Performed at [facility] [address] [place], [place] [address]    Special Requests   Final    BOTTLES DRAWN AEROBIC AND ANAEROBIC Blood Culture adequate volume Performed at [facility] [address], [place], [place] [address]    Culture   Final    NO GROWTH 3 DAYS Performed at [facility], [place] [address] [place],  [address]    Report Status PENDING  Incomplete  Culture, blood (x 2)     Status: None (Preliminary result)   Collection Time: 07/17/17  9:09 PM  Result Value Ref Range Status   Specimen Description   Final  BLOOD LEFT HAND Performed at [facility] [address], [place], [place] [address]    Special Requests   Final    BOTTLES DRAWN AEROBIC AND ANAEROBIC Blood Culture adequate volume Performed at [facility] [address], [place], [place] [address]    Culture   Final    NO GROWTH 3 DAYS Performed at [facility], [place] [address], [place], [place] [address]    Report Status PENDING  Incomplete  MRSA PCR Screening     Status: None   Collection Time: 07/17/17 10:48 PM  Result Value Ref Range Status   MRSA by PCR NEGATIVE NEGATIVE Final    Comment:        The GeneXpert MRSA Assay (FDA approved for NASAL specimens only), is one component of a comprehensive MRSA colonization surveillance program. It is not intended to diagnose MRSA infection nor to guide or monitor treatment for MRSA infections. Performed at [facility], [place] [address], [place], [place] [address]     Radiology Reports Ct Chest W Contrast  Result Date: 07/14/2017 CLINICAL DATA:  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 Patient underwent first cycle of palliative chemotherapy  04/15/2017 with subsequent hospitalization for multilobar pneumonia. Patient presents for baseline restaging prior to resuming chemotherapy. EXAM: CT CHEST, ABDOMEN, AND PELVIS WITH CONTRAST TECHNIQUE: Multidetector CT imaging of the chest, abdomen and pelvis was performed following the standard protocol during bolus administration of intravenous contrast. CONTRAST:  43mL ISOVUE-300 IOPAMIDOL (ISOVUE-300) INJECTION 61% COMPARISON:  05/06/2017 unenhanced chest CT. 04/30/2017 IV contrast-enhanced chest CT. 03/23/2017 PET-CT. FINDINGS: CT CHEST FINDINGS Cardiovascular: Normal heart size. Stable small pericardial effusion/thickening. Atherosclerotic nonaneurysmal thoracic aorta. Normal caliber main pulmonary artery. No central pulmonary emboli. Mediastinum/Nodes: No discrete thyroid nodules. The upper thoracic esophagus is newly mildly dilated and filled with oral contrast and debris, with an apparent esophageal caliber transition at the level of the infiltrative central left lung tumor, which appears to encase/invade the thoracic esophagus (series 2/image 34). No axillary adenopathy. No right hilar adenopathy. No discrete pathologically enlarged mediastinal nodes. Lungs/Pleura: There is prominent volume loss in the left hemithorax. There is a moderate left hydropneumothorax with circumferential left pleural thickening and enhancement, with basilar left PleurX catheter well positioned with the tip in posterior mid to upper left pleural space. The left hydropneumothorax is overall significantly decreased in size since 05/06/2017 chest CT. Infiltrative poorly marginated partially necrotic heterogeneously enhancing central left lung mass measures approximately 10.0 x 6.4 cm (series 2/image 36), overall not appreciably changed in size from 04/30/2017 chest CT, where it measured 10.9 x 6.6 cm. The central left lung mass directly invades the left hilum and subcarinal mediastinum. Occlusion of the left upper and left lower  lobe bronchi by the mass, with complete left lower lobe atelectasis and limited aeration of the left upper lobe, with bandlike opacities throughout the largely collapsed left upper lobe, compatible with a combination of atelectasis and postinfectious scarring. The necrotic left lower lobe portion of the tumor appears to directly communicate with the left pleural space (series 2/images 41 and 42). No right pleural effusion. Moderate centrilobular and paraseptal emphysema with diffuse bronchial wall thickening. Stable calcified subcentimeter medial right lower lobe granuloma. No acute consolidative airspace disease or significant pulmonary nodules in the right lung. Musculoskeletal:  No aggressive appearing focal osseous lesions. CT ABDOMEN PELVIS FINDINGS Hepatobiliary: Normal liver with no liver mass. Cholelithiasis. Stable nonspecific wall thickening in the fundal portion of  the gallbladder. No pericholecystic fluid. No biliary ductal dilatation. Pancreas: Normal, with no mass or duct dilation. Spleen: Normal size. No mass. Adrenals/Urinary Tract: No discrete adrenal nodules. No hydronephrosis. Simple 2.4 cm lateral lower right renal cyst. Several subcentimeter hypodense renal cortical lesions in both kidneys are too small to characterize. Bladder is completely collapsed by indwelling Foley catheter. New 18 x 10 mm calcification at the right posterior bladder, presumably a bladder stone. Stomach/Bowel: Normal non-distended stomach. Normal caliber small bowel with no small bowel wall thickening. Normal appendix. Mild sigmoid diverticulosis, with no large bowel wall thickening or pericolonic fat stranding. Vascular/Lymphatic: Atherosclerotic abdominal aorta with stable 3.0 cm infrarenal abdominal aortic aneurysm. Patent portal, splenic, hepatic and renal veins. No pathologically enlarged lymph nodes in the abdomen or pelvis. Reproductive: Stable moderately enlarged prostate. Other: No pneumoperitoneum, ascites or  focal fluid collection. Musculoskeletal: No aggressive appearing focal osseous lesions. 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 IMPRESSION: 1. Infiltrative partially necrotic central left lung tumor is overall stable in size since 04/30/2017 chest CT. Persistent occlusion of the central left lung airways with complete left lower lobe atelectasis and near complete left upper lobe scarring/atelectasis. 2. Moderate left hydropneumothorax is significantly decreased in size since 05/06/2017 chest CT with left PleurX catheter in place. Probable direct communication of the necrotic left lower lobe portion of the tumor with the left pleural space. 3. Circumferential left pleural thickening and enhancement compatible with malignant effusion. 4. Findings worrisome for developing malignant stricture in the midthoracic esophagus, with new dilatation of the upper thoracic esophagus, which is filled with oral contrast and debris. Patient is at high risk of aspiration. 5. Otherwise no evidence of new or progressive metastatic disease in the chest. No evidence of metastatic disease in the abdomen, pelvis or skeleton. 6. Stable small pericardial effusion/thickening. 7. New 18 x 10 mm right posterior bladder calcification adjacent to the indwelling Foley catheter, presumably a bladder stone. No hydronephrosis. 8. Aortic Atherosclerosis (ICD10-I70.0) and Emphysema (ICD10-J43.9). Numerous chronic findings as above. These results will be called to the ordering clinician or representative by the Radiologist Assistant, and communication documented in the PACS or zVision Dashboard. Electronically Signed   By: Ilona Sorrel M.D.   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 Patient underwent first cycle of palliative chemotherapy 04/15/2017 with subsequent hospitalization for multilobar pneumonia. Patient  presents for baseline restaging prior to resuming chemotherapy. EXAM: CT CHEST, ABDOMEN, AND PELVIS WITH CONTRAST TECHNIQUE: Multidetector CT imaging of the chest, abdomen and pelvis was performed following the standard protocol during bolus administration of intravenous contrast. CONTRAST:  44mL ISOVUE-300 IOPAMIDOL (ISOVUE-300) INJECTION 61% COMPARISON:  05/06/2017 unenhanced chest CT. 04/30/2017 IV contrast-enhanced chest CT. 03/23/2017 PET-CT. FINDINGS: CT CHEST FINDINGS Cardiovascular: Normal heart size. Stable small pericardial effusion/thickening. Atherosclerotic nonaneurysmal thoracic aorta. Normal caliber main pulmonary artery. No central pulmonary emboli. Mediastinum/Nodes: No discrete thyroid nodules. The upper thoracic esophagus is newly mildly dilated and filled with oral contrast and debris, with an apparent esophageal caliber transition at the level of the infiltrative central left lung tumor, which appears to encase/invade the thoracic esophagus (series 2/image 34). No axillary adenopathy. No right hilar adenopathy. No discrete pathologically enlarged mediastinal nodes. Lungs/Pleura: There is prominent volume loss in the left hemithorax. There is a moderate left hydropneumothorax with circumferential left pleural thickening and enhancement, with basilar left PleurX catheter well positioned with the tip  in posterior mid to upper left pleural space. The left hydropneumothorax is overall significantly decreased in size since 05/06/2017 chest CT. Infiltrative poorly marginated partially necrotic heterogeneously enhancing central left lung mass measures approximately 10.0 x 6.4 cm (series 2/image 36), overall not appreciably changed in size from 04/30/2017 chest CT, where it measured 10.9 x 6.6 cm. The central left lung mass directly invades the left hilum and subcarinal mediastinum. Occlusion of the left upper and left lower lobe bronchi by the mass, with complete left lower lobe atelectasis and limited  aeration of the left upper lobe, with bandlike opacities throughout the largely collapsed left upper lobe, compatible with a combination of atelectasis and postinfectious scarring. The necrotic left lower lobe portion of the tumor appears to directly communicate with the left pleural space (series 2/images 41 and 42). No right pleural effusion. Moderate centrilobular and paraseptal emphysema with diffuse bronchial wall thickening. Stable calcified subcentimeter medial right lower lobe granuloma. No acute consolidative airspace disease or significant pulmonary nodules in the right lung. Musculoskeletal:  No aggressive appearing focal osseous lesions. CT ABDOMEN PELVIS FINDINGS Hepatobiliary: Normal liver with no liver mass. Cholelithiasis. Stable nonspecific wall thickening in the fundal portion of the gallbladder. No pericholecystic fluid. No biliary ductal dilatation. Pancreas: Normal, with no mass or duct dilation. Spleen: Normal size. No mass. Adrenals/Urinary Tract: No discrete adrenal nodules. No hydronephrosis. Simple 2.4 cm lateral lower right renal cyst. Several subcentimeter hypodense renal cortical lesions in both kidneys are too small to characterize. Bladder is completely collapsed by indwelling Foley catheter. New 18 x 10 mm calcification at the right posterior bladder, presumably a bladder stone. Stomach/Bowel: Normal non-distended stomach. Normal caliber small bowel with no small bowel wall thickening. Normal appendix. Mild sigmoid diverticulosis, with no large bowel wall thickening or pericolonic fat stranding. Vascular/Lymphatic: Atherosclerotic abdominal aorta with stable 3.0 cm infrarenal abdominal aortic aneurysm. Patent portal, splenic, hepatic and renal veins. No pathologically enlarged lymph nodes in the abdomen or pelvis. Reproductive: Stable moderately enlarged prostate. Other: No pneumoperitoneum, ascites or focal fluid collection. Musculoskeletal: No aggressive appearing focal osseous  lesions. 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 IMPRESSION: 1. Infiltrative partially necrotic central left lung tumor is overall stable in size since 04/30/2017 chest CT. Persistent occlusion of the central left lung airways with complete left lower lobe atelectasis and near complete left upper lobe scarring/atelectasis. 2. Moderate left hydropneumothorax is significantly decreased in size since 05/06/2017 chest CT with left PleurX catheter in place. Probable direct communication of the necrotic left lower lobe portion of the tumor with the left pleural space. 3. Circumferential left pleural thickening and enhancement compatible with malignant effusion. 4. Findings worrisome for developing malignant stricture in the midthoracic esophagus, with new dilatation of the upper thoracic esophagus, which is filled with oral contrast and debris. Patient is at high risk of aspiration. 5. Otherwise no evidence of new or progressive metastatic disease in the chest. No evidence of metastatic disease in the abdomen, pelvis or skeleton. 6. Stable small pericardial effusion/thickening. 7. New 18 x 10 mm right posterior bladder calcification adjacent to the indwelling Foley catheter, presumably a bladder stone. No hydronephrosis. 8. Aortic Atherosclerosis (ICD10-I70.0) and Emphysema (ICD10-J43.9). Numerous chronic findings as above. These results will be called to the ordering clinician or representative by the Radiologist Assistant, and communication documented in the PACS or zVision Dashboard. Electronically Signed   By: Ilona Sorrel M.D.   On: 07/14/2017 15:19   Dg Chest Port 1 View  Result Date: 07/18/2017  CLINICAL DATA:  Acute onset shortness of breath. Current history of left lung cancer. EXAM: PORTABLE CHEST 1 VIEW COMPARISON:  07/17/2017, 05/21/2017 and earlier, including CT chest 07/14/2017 and earlier. FINDINGS: Near complete opacification of the left hemithorax due to a combination of previously  identified central lung cancer, obstructive atelectasis/pneumonitis involving the left lung and a large loculated pleural effusion (as seen on the prior CT). PleurX catheter in the medial left pleural space. Right lung hyperinflated with prominent bronchovascular markings diffusely and central peribronchial thickening, unchanged. No new abnormalities in the right lung. IMPRESSION: 1. Stable appearance of the nearly completely opacified left hemithorax due to a combination of central malignancy, postobstructive atelectasis/pneumonitis and loculated pleural effusion. PleurX catheter remains in place medially. 2. No acute abnormality involving the right lung.  COPD/emphysema. Electronically Signed   By: Evangeline Dakin M.D.   On: 07/18/2017 11:41   Dg Chest Port 1 View  Result Date: 07/17/2017 CLINICAL DATA:  77 year old male with stage IV lung cancer diagnosed November 2018. Shortness of breath. EXAM: PORTABLE CHEST 1 VIEW COMPARISON:  Restaging CT chest abdomen and pelvis 07/14/2017 and earlier. FINDINGS: Two portable AP semi upright views at 1745 hours. Progressive opacification of the left hemithorax since 05/21/2017, although the left lung ventilation appears only mildly worsened since 07/14/2017. There is subtotal opacification of the left hemithorax now. A percutaneous pleural catheter remains in place. Leftward shift of the mediastinum may have increased along with the opacification of the residual left perihilar lung since 07/14/2017. The left mainstem bronchus is poorly visible. The right lung appears hyperinflated and clear. Visualized tracheal air column is within normal limits. IMPRESSION: 1. Subtotal opacification of the left hemithorax now, with progressive opacification of the small area of residual pneumatized lung since the recent restaging CT 07/14/2017. Given the recent CT appearance, this might reflect a combination of progressive left bronchial compression due to tumor and/or mucous plugging.  2. Stable left pleural drain. 3. Hyperinflated right lung. Electronically Signed   By: Genevie Ann M.D.   On: 07/17/2017 18:32     CBC Recent Labs  Lab 07/17/17 1750 07/19/17 0822  WBC 11.1* 10.0  HGB 10.0* 9.3*  HCT 33.9* 32.5*  PLT 345 344  MCV 91.9 93.1  MCH 27.1 26.6  MCHC 29.5* 28.6*  RDW 16.0* 16.2*  LYMPHSABS 0.6*  --   MONOABS 0.5  --   EOSABS 0.2  --   BASOSABS 0.0  --     Chemistries  Recent Labs  Lab 07/17/17 1750 07/19/17 0822  NA 139 141  K 4.4 4.4  CL 102 104  CO2 33* 28  GLUCOSE 124* 152*  BUN 24* 21*  CREATININE 0.64 0.59*  CALCIUM 8.6* 8.5*  AST 15  --   ALT 20  --   ALKPHOS 129*  --   BILITOT 0.4  --    ------------------------------------------------------------------------------------------------------------------ No results for input(s): CHOL, HDL, LDLCALC, TRIG, CHOLHDL, LDLDIRECT in the last 72 hours.  Lab Results  Component Value Date   HGBA1C 7.4 05/22/2017   ------------------------------------------------------------------------------------------------------------------ No results for input(s): TSH, T4TOTAL, T3FREE, THYROIDAB in the last 72 hours.  Invalid input(s): FREET3 ------------------------------------------------------------------------------------------------------------------ No results for input(s): VITAMINB12, FOLATE, FERRITIN, TIBC, IRON, RETICCTPCT in the last 72 hours.  Coagulation profile No results for input(s): INR, PROTIME in the last 168 hours.  No results for input(s): DDIMER in the last 72 hours.  Cardiac Enzymes No results for input(s): CKMB, TROPONINI, MYOGLOBIN in the last 168 hours.  Invalid input(s): CK ------------------------------------------------------------------------------------------------------------------  Component Value Date/Time   BNP 440.6 (H) 04/24/2017 2227     Roxan Hockey M.D on 07/20/2017 at 6:43 PM  Between 7am to 7pm - Pager - [phone]  After 7pm go to  [email or web address] - password [email or web address]  [facility] -  Office  [phone]   Voice Recognition Viviann Spare dictation system was used to create this note, attempts have been made to correct errors. Please contact the author with questions and/or clarifications.

## 2017-07-20 NOTE — Consult Note (Signed)
[facility] Nurse wound consult note Reason for Consult: Skin changes at life's end (SCALE) in the sacral areas.  Deep purple circular discolorations scattered in the sacral plan and at the left ischial tuberosity that are not pressure related.  Wound type: ischemic infarcts Pressure Injury POA: NA Measurement: 7 x 7 cm area with 5 circular lesions present. Wound bed:N/A Drainage (amount, consistency, odor) None Periwound:intact, blanchable Dressing procedure/placement/frequency: Patient is awaiting placement at a [facility]; no bed is currently available so I will go ahead and place the mattress replacement for comfort and pressure redistribution.  Heel boots for pressure redistribution of heels. Sacral silicone foam dressings are appropriate for topical care.  [facility] nursing team will not follow, but will remain available to this patient, the nursing and medical teams.  Please re-consult if needed. Thanks, Maudie Flakes, MSN, RN, [place], Ar[facility]  Pager# [phone]

## 2017-07-20 NOTE — Progress Notes (Signed)
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, his wife, and his daughter.  I discussed with his wife and daughter our conversation yesterday regarding goals of care moving forward to be to ensure that he feels as well as he can for whatever time he has left.  They understand that he has incurable illness and is going to progress in symptoms.  He talk with me again today about his concerns of suffering as he approaches the end of his life and states that someone was telling him something about getting medication to "knock him out".  I discussed with him the goal of care being to ensure that his symptoms are managed to the best of our abilities.  We specifically discussed his concerns about shortness of breath and anxiety.  He has medications ordered as needed but has not been receiving them.  In talking with him today, he reports that he is not currently feeling short of breath nor anxious.  I assured him that moving forward he will have medications as needed and if it comes to a point where he is receiving comfort medications consistently, we could always consider initiating continuous infusion if needed to ensure that he is comfortable.  We also discussed that the goal of our therapies is to ensure that he is feeling as well as possible and can have potential side effect of sedation but the goal of giving medication would not be to shorten his life.  He appeared to be much more relaxed and agreeable after assurance that focus will be on his comfort and ensuring that he does not suffer as he approaches end of life.  We also discussed the fact that he is still been having a lot of medications offered to him by mouth and this is been burdensome for him.  He and his family are in agreement with plan to continue to work to come off of interventions that  are not strictly related to comfort while ensuring that he has medications available as needed to treat any symptoms that arise.  I have been concerned that he is going to acutely decompensate, however, he appears to be similar in appearance clinically as to what he was yesterday.  While I am going off service, I will ask another member of the palliative medicine team to check in on him for symptom management and to progress conversation of disposition it appears he may become stable enough to consider transition from the hospital.  He may be a good candidate for residential hospice.  I did not discuss this with family today as focus was on making a shift to comfort care and ensuring that all their questions were answered about care plan for the day and prognosis.  While he denies anxiety, I think that he is anxious overall about next steps moving forward and I do not want to pressure him while we are working to transition him to a regimen that would ensure his symptoms are well managed moving forward.  Total time: 45 minutes Greater than 50%  of this time was spent counseling and coordinating care related to the above assessment and plan.  Micheline Rough, MD [facility] Team [phone]

## 2017-07-21 NOTE — Progress Notes (Signed)
Patient Demographics:    Maurice Tyler, is a 77 y.o. male, DOB - April 01, 1941, STM:[id number]  Admit date - 07/17/2017   Admitting Physician Ivor Costa, MD  Outpatient Primary MD for the patient is Maurice Redwood, MD  LOS - 3   Chief Complaint  Patient presents with  . 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 Subtotal opacification of the left hemithorax now, with progressive opacification of the small area of residual pneumatized lung since the recent restaging CT 07/14/2017. Given the recent CT appearance, this might reflect a combination of progressive left bronchial compression due to tumor and/or mucous plugging. 2. Stable left pleural drain. 3. Hyperinflated right lung.    Plan:- 1)Acute on Chronic Hypoxic Respiratory Failure- patient with near total opacification of the left lung, please see chest x-ray report above, discussed with Dr. Lake Bells from [facility] service who is familiar with patient, official pulmonology consult appreciated, on 07/19/17 after conversations with Dr. Lake Bells patient is a decided to be comfort measures only please see progress note from Dr. Simonne Maffucci dated 07/19/2017 , c/n IV Solu-Medrol, STOP drainage of Lt Pleurx Catheter with [place] device for now, may resume drainage as needed if  respiratory distress not relieved by morphine sulfate continue bronchodilators.     2)Social/Ethics-patient is now a DNR/DNI, after conversations with Dr. Lake Bells and further conversations with Dr. Domingo Cocking and patient's wife,    Patient and his wife has decided to be comfort measures only please see progress note from Dr. Simonne Maffucci dated 07/19/2017 and Dr Domingo Cocking on 07/20/17.   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 Earlie Server, discussed with Dr. Lebron Conners on 07/18/2017, official oncology consult appreciated, pt is comfort measures only   4)DM-anticipate worsening glycemic control due to steroids last A1c was 7.4, Use Novolog/Humalog Sliding scale insulin with Accu-Cheks/Fingersticks as ordered   5)Afib-  chadsvasc score is 3, patient is currently not on anticoagulation due to complex comorbid condition and poor prognosis, continue amiodarone, pt is comfort measures only    Code Status : DNR/DNI  Disposition Plan  :  Patient and his wife has decided to be comfort measures only please see progress note from Dr. Simonne Maffucci dated 07/19/2017 and Dr Domingo Cocking on 07/20/17.   Overall prognosis  is poor/grave, patient is awaiting bed at [facility], currently requiring 15 L of oxygen/nonrebreather bag so unable to go home to his house with hospice due to high oxygen requirement  Consults  :  PCCM/Palliative   Lab Results  Component Value Date   PLT 344 07/19/2017    Inpatient Medications  Scheduled Meds: . amiodarone  400 mg Oral BID  . arformoterol  15 mcg Nebulization BID  . budesonide (PULMICORT) nebulizer solution  0.5 mg  Nebulization BID  . chlorhexidine  15 mL Mouth Rinse BID  . clotrimazole  10 mg Oral 5 X Daily  . feeding supplement  1 Container Oral TID BM  . ipratropium-albuterol  3 mL Nebulization TID  . mouth rinse  15 mL Mouth Rinse q12n4p  . methylPREDNISolone (SOLU-MEDROL) injection  60 mg Intravenous Q12H   Continuous Infusions: . dextrose 5 % and 0.45% NaCl 1,000 mL (07/20/17 1418)   PRN Meds:.acetaminophen, albuterol, antiseptic oral rinse, glycopyrrolate **OR** glycopyrrolate **OR** glycopyrrolate, haloperidol **OR** haloperidol **OR** haloperidol lactate, loperamide, LORazepam **OR** LORazepam **OR** LORazepam, menthol-cetylpyridinium, morphine injection, ondansetron, polyvinyl alcohol    Anti-infectives (From admission, onward)   Start     Dose/Rate Route Frequency Ordered Stop   07/17/17 2200  sulfamethoxazole-trimethoprim (BACTRIM DS,SEPTRA DS) 800-160 MG per tablet 1 tablet  Status:  Discontinued     1 tablet Oral 2 times daily 07/17/17 2040 07/17/17 2128        Objective:   Vitals:   07/20/17 0532 07/20/17 1941 07/21/17 0459 07/21/17 1531  BP: 96/67  95/66 100/75  Pulse: 100  (!) 105 (!) 107  Resp: (!) 24  (!) 24 20  Temp: 97.6 F (36.4 C)  98.3 F (36.8 C) 98.1 F (36.7 C)  TempSrc: Axillary  Oral Oral  SpO2: 100% 90% 100% 99%  Weight:   61.3 kg (135 lb 2.3 oz)   Height:        Wt Readings from Last 3 Encounters:  07/21/17 61.3 kg (135 lb 2.3 oz)  07/17/17 61.1 kg (134 lb 9.6 oz)  07/10/17 61.1 kg (134 lb 9.6 oz)     Intake/Output Summary (Last 24 hours) at 07/21/2017 1850 Last data filed at 07/21/2017 0502 Gross per 24 hour  Intake -  Output 1525 ml  Net -1525 ml     Physical Exam  Gen:- Awake Alert, resting, looks chronically ill HEENT:- [place].AT, No sclera icterus Nose- NRB Neck-Supple Neck,No JVD,.  Lungs- very diminished on the left, Pleurx catheter noted  CV- S1, S2 normal Abd-  +ve B.Sounds, Abd Soft, No tenderness,    Extremity/Skin:- No  edema,   warm Psych-affect is appropriate, oriented x3 Neuro-no new focal deficits, no tremors   Data Review:   Micro Results Recent Results (from the past 240 hour(s))  Culture, blood (x 2)     Status: None (Preliminary result)   Collection Time: 07/17/17  9:02 PM  Result Value Ref Range Status   Specimen Description   Final    BLOOD RIGHT HAND Performed at [facility] [address], [place], [place] [address]    Special Requests   Final    BOTTLES DRAWN AEROBIC AND ANAEROBIC Blood Culture adequate volume Performed at [facility] [address], [place], [place] [address]    Culture   Final    NO GROWTH 4 DAYS Performed at [facility], [place] [address], [place], [place] [address]    Report Status PENDING  Incomplete  Culture, blood (x 2)     Status: None (Preliminary result)   Collection Time: 07/17/17  9:09 PM  Result Value Ref Range Status   Specimen Description   Final    BLOOD LEFT HAND Performed at [facility] [address], [place], [place] [address]    Special Requests   Final    BOTTLES DRAWN AEROBIC AND ANAEROBIC Blood Culture adequate volume Performed at [facility] [address] [place], [place] [address]    Culture   Final    NO GROWTH 4 DAYS Performed at [facility], [place] [address] [place], [place] [address]    Report Status PENDING  Incomplete  MRSA PCR Screening     Status: None   Collection Time: 07/17/17 10:48 PM  Result Value Ref Range Status   MRSA by PCR NEGATIVE NEGATIVE Final    Comment:        The GeneXpert MRSA Assay (FDA approved for NASAL specimens only), is one component of a comprehensive MRSA colonization surveillance program. It is not intended to diagnose MRSA infection nor to guide or monitor treatment for MRSA infections. Performed at [facility], [place] [address], [place], [place] [address]     Radiology Reports Ct  Chest W Contrast  Result Date: 07/14/2017 CLINICAL DATA:  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 Patient underwent first cycle of palliative chemotherapy 04/15/2017 with subsequent hospitalization for multilobar pneumonia. Patient presents for baseline restaging prior to resuming chemotherapy. EXAM: CT CHEST, ABDOMEN, AND PELVIS WITH CONTRAST TECHNIQUE: Multidetector CT imaging of the chest, abdomen and pelvis was performed following the standard protocol during bolus administration of intravenous contrast. CONTRAST:  43mL ISOVUE-300 IOPAMIDOL (ISOVUE-300) INJECTION 61% COMPARISON:  05/06/2017 unenhanced chest CT. 04/30/2017 IV contrast-enhanced chest CT. 03/23/2017 PET-CT. FINDINGS: CT CHEST FINDINGS Cardiovascular: Normal heart size. Stable small pericardial effusion/thickening. Atherosclerotic nonaneurysmal thoracic aorta. Normal caliber main pulmonary artery. No central pulmonary emboli. Mediastinum/Nodes: No discrete thyroid nodules. The upper thoracic esophagus is newly mildly dilated and filled with oral contrast and debris, with an apparent esophageal caliber transition at the level of the infiltrative central left lung tumor, which appears to encase/invade the thoracic esophagus (series 2/image 34). No axillary adenopathy. No right hilar adenopathy. No discrete pathologically enlarged mediastinal nodes. Lungs/Pleura: There is prominent volume loss in the left hemithorax. There is a moderate left hydropneumothorax with circumferential left pleural thickening and enhancement, with basilar left PleurX catheter well positioned with the tip in posterior mid to upper left pleural space. The left hydropneumothorax is overall significantly decreased in size since 05/06/2017 chest CT. Infiltrative poorly marginated partially necrotic heterogeneously enhancing central left lung mass measures approximately 10.0 x 6.4 cm (series 2/image 36), overall not appreciably changed in size from  04/30/2017 chest CT, where it measured 10.9 x 6.6 cm. The central left lung mass directly invades the left hilum and subcarinal mediastinum. Occlusion of the left upper and left lower lobe bronchi by the mass, with complete left lower lobe atelectasis and limited aeration of the left upper lobe, with bandlike opacities throughout the largely collapsed left upper lobe, compatible with a combination of atelectasis and postinfectious scarring. The necrotic left lower lobe portion of the tumor appears to directly communicate with the left pleural space (series 2/images 41 and 42). No right pleural effusion. Moderate centrilobular and paraseptal emphysema with diffuse bronchial wall thickening. Stable calcified subcentimeter medial right lower lobe granuloma. No acute consolidative airspace disease or  significant pulmonary nodules in the right lung. Musculoskeletal:  No aggressive appearing focal osseous lesions. CT ABDOMEN PELVIS FINDINGS Hepatobiliary: Normal liver with no liver mass. Cholelithiasis. Stable nonspecific wall thickening in the fundal portion of the gallbladder. No pericholecystic fluid. No biliary ductal dilatation. Pancreas: Normal, with no mass or duct dilation. Spleen: Normal size. No mass. Adrenals/Urinary Tract: No discrete adrenal nodules. No hydronephrosis. Simple 2.4 cm lateral lower right renal cyst. Several subcentimeter hypodense renal cortical lesions in both kidneys are too small to characterize. Bladder is completely collapsed by indwelling Foley catheter. New 18 x 10 mm calcification at the right posterior bladder, presumably a bladder stone. Stomach/Bowel: Normal non-distended stomach. Normal caliber small bowel with no small bowel wall thickening. Normal appendix. Mild sigmoid diverticulosis, with no large bowel wall thickening or pericolonic fat stranding. Vascular/Lymphatic: Atherosclerotic abdominal aorta with stable 3.0 cm infrarenal abdominal aortic aneurysm. Patent portal, splenic,  hepatic and renal veins. No pathologically enlarged lymph nodes in the abdomen or pelvis. Reproductive: Stable moderately enlarged prostate. Other: No pneumoperitoneum, ascites or focal fluid collection. Musculoskeletal: No aggressive appearing focal osseous lesions. 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 IMPRESSION: 1. Infiltrative partially necrotic central left lung tumor is overall stable in size since 04/30/2017 chest CT. Persistent occlusion of the central left lung airways with complete left lower lobe atelectasis and near complete left upper lobe scarring/atelectasis. 2. Moderate left hydropneumothorax is significantly decreased in size since 05/06/2017 chest CT with left PleurX catheter in place. Probable direct communication of the necrotic left lower lobe portion of the tumor with the left pleural space. 3. Circumferential left pleural thickening and enhancement compatible with malignant effusion. 4. Findings worrisome for developing malignant stricture in the midthoracic esophagus, with new dilatation of the upper thoracic esophagus, which is filled with oral contrast and debris. Patient is at high risk of aspiration. 5. Otherwise no evidence of new or progressive metastatic disease in the chest. No evidence of metastatic disease in the abdomen, pelvis or skeleton. 6. Stable small pericardial effusion/thickening. 7. New 18 x 10 mm right posterior bladder calcification adjacent to the indwelling Foley catheter, presumably a bladder stone. No hydronephrosis. 8. Aortic Atherosclerosis (ICD10-I70.0) and Emphysema (ICD10-J43.9). Numerous chronic findings as above. These results will be called to the ordering clinician or representative by the Radiologist Assistant, and communication documented in the PACS or zVision Dashboard. Electronically Signed   By: Ilona Sorrel M.D.   On: 07/14/2017 15:19   Ct Abdomen Pelvis W Contrast  Result Date: 07/14/2017 CLINICAL DATA:  Stage IV squamous  cell left lung carcinoma diagnosed November 2018. Patient underwent first cycle of palliative chemotherapy 04/15/2017 with subsequent hospitalization for multilobar pneumonia. Patient presents for baseline restaging prior to resuming chemotherapy. EXAM: CT CHEST, ABDOMEN, AND PELVIS WITH CONTRAST TECHNIQUE: Multidetector CT imaging of the chest, abdomen and pelvis was performed following the standard protocol during bolus administration of intravenous contrast. CONTRAST:  51mL ISOVUE-300 IOPAMIDOL (ISOVUE-300) INJECTION 61% COMPARISON:  05/06/2017 unenhanced chest CT. 04/30/2017 IV contrast-enhanced chest CT. 03/23/2017 PET-CT. FINDINGS: CT CHEST FINDINGS Cardiovascular: Normal heart size. Stable small pericardial effusion/thickening. Atherosclerotic nonaneurysmal thoracic aorta. Normal caliber main pulmonary artery. No central pulmonary emboli. Mediastinum/Nodes: No discrete thyroid nodules. The upper thoracic esophagus is newly mildly dilated and filled with oral contrast and debris, with an apparent esophageal caliber transition at the level of the infiltrative central left lung tumor, which appears to encase/invade the thoracic esophagus (series 2/image 34). No axillary adenopathy. No right hilar adenopathy. No discrete pathologically  enlarged mediastinal nodes. Lungs/Pleura: There is prominent volume loss in the left hemithorax. There is a moderate left hydropneumothorax with circumferential left pleural thickening and enhancement, with basilar left PleurX catheter well positioned with the tip in posterior mid to upper left pleural space. The left hydropneumothorax is overall significantly decreased in size since 05/06/2017 chest CT. Infiltrative poorly marginated partially necrotic heterogeneously enhancing central left lung mass measures approximately 10.0 x 6.4 cm (series 2/image 36), overall not appreciably changed in size from 04/30/2017 chest CT, where it measured 10.9 x 6.6 cm. The central left lung mass  directly invades the left hilum and subcarinal mediastinum. Occlusion of the left upper and left lower lobe bronchi by the mass, with complete left lower lobe atelectasis and limited aeration of the left upper lobe, with bandlike opacities throughout the largely collapsed left upper lobe, compatible with a combination of atelectasis and postinfectious scarring. The necrotic left lower lobe portion of the tumor appears to directly communicate with the left pleural space (series 2/images 41 and 42). No right pleural effusion. Moderate centrilobular and paraseptal emphysema with diffuse bronchial wall thickening. Stable calcified subcentimeter medial right lower lobe granuloma. No acute consolidative airspace disease or significant pulmonary nodules in the right lung. Musculoskeletal:  No aggressive appearing focal osseous lesions. CT ABDOMEN PELVIS FINDINGS Hepatobiliary: Normal liver with no liver mass. Cholelithiasis. Stable nonspecific wall thickening in the fundal portion of the gallbladder. No pericholecystic fluid. No biliary ductal dilatation. Pancreas: Normal, with no mass or duct dilation. Spleen: Normal size. No mass. Adrenals/Urinary Tract: No discrete adrenal nodules. No hydronephrosis. Simple 2.4 cm lateral lower right renal cyst. Several subcentimeter hypodense renal cortical lesions in both kidneys are too small to characterize. Bladder is completely collapsed by indwelling Foley catheter. New 18 x 10 mm calcification at the right posterior bladder, presumably a bladder stone. Stomach/Bowel: Normal non-distended stomach. Normal caliber small bowel with no small bowel wall thickening. Normal appendix. Mild sigmoid diverticulosis, with no large bowel wall thickening or pericolonic fat stranding. Vascular/Lymphatic: Atherosclerotic abdominal aorta with stable 3.0 cm infrarenal abdominal aortic aneurysm. Patent portal, splenic, hepatic and renal veins. No pathologically enlarged lymph nodes in the abdomen  or pelvis. Reproductive: Stable moderately enlarged prostate. Other: No pneumoperitoneum, ascites or focal fluid collection. Musculoskeletal: No aggressive appearing focal osseous lesions. 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 IMPRESSION: 1. Infiltrative partially necrotic central left lung tumor is overall stable in size since 04/30/2017 chest CT. Persistent occlusion of the central left lung airways with complete left lower lobe atelectasis and near complete left upper lobe scarring/atelectasis. 2. Moderate left hydropneumothorax is significantly decreased in size since 05/06/2017 chest CT with left PleurX catheter in place. Probable direct communication of the necrotic left lower lobe portion of the tumor with the left pleural space. 3. Circumferential left pleural thickening and enhancement compatible with malignant effusion. 4. Findings worrisome for developing malignant stricture in the midthoracic esophagus, with new dilatation of the upper thoracic esophagus, which is filled with oral contrast and debris. Patient is at high risk of aspiration. 5. Otherwise no evidence of new or progressive metastatic disease in the chest. No evidence of metastatic disease in the abdomen, pelvis or skeleton. 6. Stable small pericardial effusion/thickening. 7. New 18 x 10 mm right posterior bladder calcification adjacent to the indwelling Foley catheter, presumably a bladder stone. No hydronephrosis. 8. Aortic Atherosclerosis (ICD10-I70.0) and Emphysema (ICD10-J43.9). Numerous chronic findings as above. These results will be called to the ordering clinician or representative by the  Psychologist, clinical, and communication documented in the PACS or zVision Dashboard. Electronically Signed   By: Ilona Sorrel M.D.   On: 07/14/2017 15:19   Dg Chest Port 1 View  Result Date: 07/18/2017 CLINICAL DATA:  Acute onset shortness of breath. Current history of left lung cancer. EXAM: PORTABLE CHEST 1 VIEW  COMPARISON:  07/17/2017, 05/21/2017 and earlier, including CT chest 07/14/2017 and earlier. FINDINGS: Near complete opacification of the left hemithorax due to a combination of previously identified central lung cancer, obstructive atelectasis/pneumonitis involving the left lung and a large loculated pleural effusion (as seen on the prior CT). PleurX catheter in the medial left pleural space. Right lung hyperinflated with prominent bronchovascular markings diffusely and central peribronchial thickening, unchanged. No new abnormalities in the right lung. IMPRESSION: 1. Stable appearance of the nearly completely opacified left hemithorax due to a combination of central malignancy, postobstructive atelectasis/pneumonitis and loculated pleural effusion. PleurX catheter remains in place medially. 2. No acute abnormality involving the right lung.  COPD/emphysema. Electronically Signed   By: Evangeline Dakin M.D.   On: 07/18/2017 11:41   Dg Chest Port 1 View  Result Date: 07/17/2017 CLINICAL DATA:  77 year old male with stage IV lung cancer diagnosed November 2018. Shortness of breath. EXAM: PORTABLE CHEST 1 VIEW COMPARISON:  Restaging CT chest abdomen and pelvis 07/14/2017 and earlier. FINDINGS: Two portable AP semi upright views at 1745 hours. Progressive opacification of the left hemithorax since 05/21/2017, although the left lung ventilation appears only mildly worsened since 07/14/2017. There is subtotal opacification of the left hemithorax now. A percutaneous pleural catheter remains in place. Leftward shift of the mediastinum may have increased along with the opacification of the residual left perihilar lung since 07/14/2017. The left mainstem bronchus is poorly visible. The right lung appears hyperinflated and clear. Visualized tracheal air column is within normal limits. IMPRESSION: 1. Subtotal opacification of the left hemithorax now, with progressive opacification of the small area of residual pneumatized  lung since the recent restaging CT 07/14/2017. Given the recent CT appearance, this might reflect a combination of progressive left bronchial compression due to tumor and/or mucous plugging. 2. Stable left pleural drain. 3. Hyperinflated right lung. Electronically Signed   By: Genevie Ann M.D.   On: 07/17/2017 18:32     CBC Recent Labs  Lab 07/17/17 1750 07/19/17 0822  WBC 11.1* 10.0  HGB 10.0* 9.3*  HCT 33.9* 32.5*  PLT 345 344  MCV 91.9 93.1  MCH 27.1 26.6  MCHC 29.5* 28.6*  RDW 16.0* 16.2*  LYMPHSABS 0.6*  --   MONOABS 0.5  --   EOSABS 0.2  --   BASOSABS 0.0  --     Chemistries  Recent Labs  Lab 07/17/17 1750 07/19/17 0822  NA 139 141  K 4.4 4.4  CL 102 104  CO2 33* 28  GLUCOSE 124* 152*  BUN 24* 21*  CREATININE 0.64 0.59*  CALCIUM 8.6* 8.5*  AST 15  --   ALT 20  --   ALKPHOS 129*  --   BILITOT 0.4  --    ------------------------------------------------------------------------------------------------------------------ No results for input(s): CHOL, HDL, LDLCALC, TRIG, CHOLHDL, LDLDIRECT in the last 72 hours.  Lab Results  Component Value Date   HGBA1C 7.4 05/22/2017   ------------------------------------------------------------------------------------------------------------------ No results for input(s): TSH, T4TOTAL, T3FREE, THYROIDAB in the last 72 hours.  Invalid input(s): FREET3 ------------------------------------------------------------------------------------------------------------------ No results for input(s): VITAMINB12, FOLATE, FERRITIN, TIBC, IRON, RETICCTPCT in the last 72 hours.  Coagulation profile No results for input(s): INR, PROTIME  in the last 168 hours.  No results for input(s): DDIMER in the last 72 hours.  Cardiac Enzymes No results for input(s): CKMB, TROPONINI, MYOGLOBIN in the last 168 hours.  Invalid input(s): CK ------------------------------------------------------------------------------------------------------------------     Component Value Date/Time   BNP 440.6 (H) 04/24/2017 2227   patient is awaiting bed at [facility]  Roxan Hockey M.D on 07/21/2017 at 6:50 PM  Between 7am to 7pm - Pager - [phone]  After 7pm go to [email or web address] - password [email or web address]  [facility] -  Office  [phone]   Voice Recognition Viviann Spare dictation system was used to create this note, attempts have been made to correct errors. Please contact the author with questions and/or clarifications.

## 2017-07-21 NOTE — Care Management Important Message (Signed)
Important Message  Patient Details  Name: Maurice Tyler MRN: [id number] Date of Birth: August 07, 1940   Medicare Important Message Given:  Yes    Kerin Salen 07/21/2017, 12:17 [place] Message  Patient Details  Name: Maurice Tyler MRN: [id number] Date of Birth: May 29, 1940   Medicare Important Message Given:  Yes    Kerin Salen 07/21/2017, 12:16 PM

## 2017-07-21 NOTE — Progress Notes (Signed)
PMT progress note  Maurice Tyler was seen earlier this am, resting in bed, on high O2 requirements, did not appear to be in acute distress. No family in room.   BP 100/75 (BP Location: Left Arm)   Pulse (!) 107   Temp 98.1 F (36.7 C) (Oral)   Resp 20   Ht 5\' 10"  (1.778 m)   Wt 61.3 kg (135 lb 2.3 oz)   SpO2 99%   BMI 19.39 kg/m  Labs and imaging noted  Appears weak and frail [place] regular work of breathing Thin cachectic weak chronically ill appearing No edema Abdomen not distended  Agree with recommendations to proceed with residential hospice, agree with establishing comfort measures as singular goal. Appreciate CSW following up with family.  Continue current mode of care Agree with DNR 15 minutes spent Loistine Chance MD [facility] health palliative medicine team [phone]

## 2017-07-21 NOTE — Progress Notes (Signed)
This CM received message from [facility] that daughter Lambert Keto had called with interest in pt receiving home services through them. Per staff RN, pt is on 15L of 02 at this time. [facility] home hospice are only able to acomodate home 02 up to 10L. This CM contacted pt daughter Lambert Keto to inform of 02 barrier. Kellie then said pt would be interested in residential hospice. Kellie to go visit [facility] today and follow up with CM/SW about visit and whether they are interested. CM will continue to follow. Marney Doctor RN,BSN,NCM [phone]

## 2017-07-22 LAB — CULTURE, BLOOD (ROUTINE X 2)
CULTURE: NO GROWTH
Culture: NO GROWTH
SPECIAL REQUESTS: ADEQUATE
SPECIAL REQUESTS: ADEQUATE

## 2017-07-22 MED ORDER — MORPHINE SULFATE 20 MG/5ML PO SOLN: 5.0000 mg | ORAL | 0 refills | Status: AC | PRN

## 2017-07-22 MED ORDER — CLOTRIMAZOLE 10 MG MT TROC: 10.0000 mg | Freq: Every day | OROMUCOSAL | 0 refills | Status: AC

## 2017-07-22 MED ORDER — LORAZEPAM 2 MG/ML PO CONC: 1.0000 mg | ORAL | 0 refills | Status: AC | PRN

## 2017-07-22 NOTE — Progress Notes (Signed)
[facility] Liaison: RN visit  Notified by Arcelia Jew of patient/family request for [facility] services at home after discharge. Chart and patient information under review by [facility] physician. Hospice eligibility approved.   Writer spoke with patient, wife- Corrine and daughterLambert Keto at bedside to initiate education related to hospice philosophy, services and team approach to care. verbalized understanding of information given. Per discussion, plan is for discharge to home by [facility] today.  Please send signed and completed DNR form home with patient/family. Patient will need prescriptions for discharge comfort medications.  DME needs have been discussed, patient currently has the following equipment in the home:none. Patient/family requests the following DME for delivery to the home: O2 for 15 high flow non-rebreather, hospital bed, OBT, 3N1, nebulizer, W/C, walker, suction . [facility] equipment manager has been notified and will contact [facility] to arrange delivery to the home. Home address has been verified and is correct in the chart. Prudy Feeler is the family member to contact to arrange time of delivery.  [facility] aware of the above. Please notify [facility] when patient is ready to leave the unit at discharge. (Call [phone] or [phone] after 5pm.) [facility] information and contact numbers given to at time of visit. Above information shared with Alinda Sierras Clements,CMRN.  Please call with any hospice related questions.  Thank you for this referral.  Farrel Gordon, RN, [facility] Liaison [phone] ? [facility] liaisons are on [place].

## 2017-07-22 NOTE — Progress Notes (Signed)
L pleurx catheter drained. 263mL purulent fluid. Pt tolerated well, no c/o pain or discomfort.

## 2017-07-22 NOTE — Discharge Summary (Addendum)
[facility]  Physician Discharge Summary   Patient ID: Maurice Tyler MRN: [id number] DOB/AGE: 10-02-1940 77 y.o.  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 Patient to be discharged home with hospice   DISCHARGE CONDITION: fair  Diet recommendation: Comfort feeds as tolerated  Filed Weights   07/17/17 1709 07/17/17 2216 07/21/17 0459  Weight: 61.2 kg (135 lb) 58.1 kg (128 lb 1.4 oz) 61.3 kg (135 lb 2.3 oz)    INITIAL HISTORY: 77 y.o. male with medical history significant of stage IV NSCLC (s/p of Left chest tube placement), diabetes mellitus, anxiety, BPH, atrial fibrillation not on anticoagulants, who presented with shortness of breath.  Imaging study showed subtotal opacification of the left hemithorax.  He was hospitalized for further management.   Consultations:  Pulmonology  Palliative medicine   HOSPITAL COURSE:   Acute on Chronic Hypoxic Respiratory Failure Patient with near total opacification of the left lung.  Patient seen by Dr. Lake Bells with pulmonology who was familiar with the patient.  Patient was started on steroids.  After further discussions with pulmonology and palliative medicine was felt that patient would most benefit from comfort measures only.  Patient agreeable to hospice services.  Patient wants to go home today if possible.  Discussed with case management and hospice.  Okay for discharge today.  He will need  to be transported by ambulance.  He will be given prescriptions for oral morphine and oral Ativan for comfort purposes.  No need to continue steroids at this time.   He is requiring 15 L of oxygen by nonrebreather.  Hospice can arrange this at home.  Goals of care Patient is now a DNR/DNI, after conversations with Dr. Lake Bells and further conversations with Dr. Domingo Cocking and patient's wife. Patient and his wife has decided to be comfort measures only.  Overall prognosis is poor/grave.  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  Drain as needed mainly for comfort.  DM2 Stable.  Afib Chadsvasc score is 3. Patient is currently not on anticoagulation due to complex comorbid condition and poor prognosis. Continue amiodarone.  Overall stable.  Okay for discharge home with hospice services.   PERTINENT LABS:  The results of significant diagnostics from this hospitalization (including imaging, microbiology, ancillary and laboratory) are listed below for reference.    Microbiology: Recent Results (from the past 240 hour(s))  Culture, blood (x 2)     Status: None   Collection Time: 07/17/17  9:02 PM  Result Value Ref Range Status   Specimen Description   Final    BLOOD RIGHT HAND Performed at [facility] [address] [place], [place] [address]    Special Requests   Final    BOTTLES DRAWN AEROBIC AND ANAEROBIC Blood Culture adequate volume Performed at [facility] [address] [place],  [address]  Culture   Final    NO GROWTH 5 DAYS Performed at [facility], [place] [address], [place], [place] [address]    Report Status 07/22/2017 FINAL  Final  Culture, blood (x 2)     Status: None   Collection Time: 07/17/17  9:09 PM  Result Value Ref Range Status   Specimen Description   Final     BLOOD LEFT HAND Performed at [facility] [address], [place], [place] [address]    Special Requests   Final    BOTTLES DRAWN AEROBIC AND ANAEROBIC Blood Culture adequate volume Performed at [facility] [address] [place], [place] [address]    Culture   Final    NO GROWTH 5 DAYS Performed at [facility], [place] [address] [place], [place] [address]    Report Status 07/22/2017 FINAL  Final  MRSA PCR Screening     Status: None   Collection Time: 07/17/17 10:48 PM  Result Value Ref Range Status   MRSA by PCR NEGATIVE NEGATIVE Final    Comment:        The GeneXpert MRSA Assay (FDA approved for NASAL specimens only), is one component of a comprehensive MRSA colonization surveillance program. It is not intended to diagnose MRSA infection nor to guide or monitor treatment for MRSA infections. Performed at [facility], [place] [address], [place], [place] [address]      Labs: Basic Metabolic Panel: Recent Labs  Lab 07/17/17 1750 07/19/17 0822  NA 139 141  K 4.4 4.4  CL 102 104  CO2 33* 28  GLUCOSE 124* 152*  BUN 24* 21*  CREATININE 0.64 0.59*  CALCIUM 8.6* 8.5*   Liver Function Tests: Recent Labs  Lab 07/17/17 1750  AST 15  ALT 20  ALKPHOS 129*  BILITOT 0.4  PROT 5.6*  ALBUMIN 2.3*   Recent Labs  Lab 07/17/17 1750  LIPASE 18   CBC: Recent Labs  Lab 07/17/17 1750 07/19/17 0822  WBC 11.1* 10.0  NEUTROABS 9.8*  --   HGB 10.0* 9.3*  HCT 33.9* 32.5*  MCV 91.9 93.1  PLT 345 344    CBG: Recent Labs  Lab 07/18/17 1250 07/18/17 1615 07/18/17 2140 07/19/17 0803 07/19/17 1153  GLUCAP 163* 185* 177* 136* 122*     IMAGING STUDIES Ct Chest W Contrast  Result Date: 07/14/2017 CLINICAL DATA:  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 Patient underwent first cycle of palliative chemotherapy 04/15/2017 with subsequent hospitalization for multilobar pneumonia.  Patient presents for baseline restaging prior to resuming chemotherapy. EXAM: CT CHEST, ABDOMEN, AND PELVIS WITH CONTRAST TECHNIQUE: Multidetector CT imaging of the chest, abdomen and pelvis was performed following the standard protocol during bolus administration of intravenous contrast. CONTRAST:  54mL ISOVUE-300 IOPAMIDOL (ISOVUE-300) INJECTION 61% COMPARISON:  05/06/2017 unenhanced chest CT. 04/30/2017 IV contrast-enhanced chest CT. 03/23/2017 PET-CT. FINDINGS: CT CHEST FINDINGS Cardiovascular: Normal heart size. Stable small pericardial effusion/thickening. Atherosclerotic nonaneurysmal thoracic aorta. Normal caliber main pulmonary artery. No central pulmonary emboli. Mediastinum/Nodes: No discrete thyroid nodules. The upper thoracic esophagus is newly mildly dilated and filled with oral contrast and debris, with an apparent esophageal caliber transition at the level of the infiltrative central left lung tumor, which appears to encase/invade the thoracic esophagus (series 2/image 34). No axillary adenopathy. No right hilar adenopathy. No discrete pathologically enlarged mediastinal nodes. Lungs/Pleura: There is prominent volume loss in the left hemithorax. There is a moderate left hydropneumothorax with  circumferential left pleural thickening and enhancement, with basilar left PleurX catheter well positioned with the tip in posterior mid to upper left pleural space. The left hydropneumothorax is overall significantly decreased in size since 05/06/2017 chest CT. Infiltrative poorly marginated partially necrotic heterogeneously enhancing central left lung mass measures approximately 10.0 x 6.4 cm (series 2/image 36), overall not appreciably changed in size from 04/30/2017 chest CT, where it measured 10.9 x 6.6 cm. The central left lung mass directly invades the left hilum and subcarinal mediastinum. Occlusion of the left upper and left lower lobe bronchi by the mass, with complete left lower lobe atelectasis and  limited aeration of the left upper lobe, with bandlike opacities throughout the largely collapsed left upper lobe, compatible with a combination of atelectasis and postinfectious scarring. The necrotic left lower lobe portion of the tumor appears to directly communicate with the left pleural space (series 2/images 41 and 42). No right pleural effusion. Moderate centrilobular and paraseptal emphysema with diffuse bronchial wall thickening. Stable calcified subcentimeter medial right lower lobe granuloma. No acute consolidative airspace disease or significant pulmonary nodules in the right lung. Musculoskeletal:  No aggressive appearing focal osseous lesions. CT ABDOMEN PELVIS FINDINGS Hepatobiliary: Normal liver with no liver mass. Cholelithiasis. Stable nonspecific wall thickening in the fundal portion of the gallbladder. No pericholecystic fluid. No biliary ductal dilatation. Pancreas: Normal, with no mass or duct dilation. Spleen: Normal size. No mass. Adrenals/Urinary Tract: No discrete adrenal nodules. No hydronephrosis. Simple 2.4 cm lateral lower right renal cyst. Several subcentimeter hypodense renal cortical lesions in both kidneys are too small to characterize. Bladder is completely collapsed by indwelling Foley catheter. New 18 x 10 mm calcification at the right posterior bladder, presumably a bladder stone. Stomach/Bowel: Normal non-distended stomach. Normal caliber small bowel with no small bowel wall thickening. Normal appendix. Mild sigmoid diverticulosis, with no large bowel wall thickening or pericolonic fat stranding. Vascular/Lymphatic: Atherosclerotic abdominal aorta with stable 3.0 cm infrarenal abdominal aortic aneurysm. Patent portal, splenic, hepatic and renal veins. No pathologically enlarged lymph nodes in the abdomen or pelvis. Reproductive: Stable moderately enlarged prostate. Other: No pneumoperitoneum, ascites or focal fluid collection. Musculoskeletal: No aggressive appearing focal  osseous lesions. 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 IMPRESSION: 1. Infiltrative partially necrotic central left lung tumor is overall stable in size since 04/30/2017 chest CT. Persistent occlusion of the central left lung airways with complete left lower lobe atelectasis and near complete left upper lobe scarring/atelectasis. 2. Moderate left hydropneumothorax is significantly decreased in size since 05/06/2017 chest CT with left PleurX catheter in place. Probable direct communication of the necrotic left lower lobe portion of the tumor with the left pleural space. 3. Circumferential left pleural thickening and enhancement compatible with malignant effusion. 4. Findings worrisome for developing malignant stricture in the midthoracic esophagus, with new dilatation of the upper thoracic esophagus, which is filled with oral contrast and debris. Patient is at high risk of aspiration. 5. Otherwise no evidence of new or progressive metastatic disease in the chest. No evidence of metastatic disease in the abdomen, pelvis or skeleton. 6. Stable small pericardial effusion/thickening. 7. New 18 x 10 mm right posterior bladder calcification adjacent to the indwelling Foley catheter, presumably a bladder stone. No hydronephrosis. 8. Aortic Atherosclerosis (ICD10-I70.0) and Emphysema (ICD10-J43.9). Numerous chronic findings as above. These results will be called to the ordering clinician or representative by the Radiologist Assistant, and communication documented in the PACS or zVision Dashboard. Electronically Signed   By: Janina Mayo.D.  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 Patient underwent first cycle of palliative chemotherapy 04/15/2017 with subsequent hospitalization for multilobar pneumonia. Patient presents for baseline restaging prior to resuming chemotherapy. EXAM:  CT CHEST, ABDOMEN, AND PELVIS WITH CONTRAST TECHNIQUE: Multidetector CT imaging of the chest, abdomen and pelvis was performed following the standard protocol during bolus administration of intravenous contrast. CONTRAST:  61mL ISOVUE-300 IOPAMIDOL (ISOVUE-300) INJECTION 61% COMPARISON:  05/06/2017 unenhanced chest CT. 04/30/2017 IV contrast-enhanced chest CT. 03/23/2017 PET-CT. FINDINGS: CT CHEST FINDINGS Cardiovascular: Normal heart size. Stable small pericardial effusion/thickening. Atherosclerotic nonaneurysmal thoracic aorta. Normal caliber main pulmonary artery. No central pulmonary emboli. Mediastinum/Nodes: No discrete thyroid nodules. The upper thoracic esophagus is newly mildly dilated and filled with oral contrast and debris, with an apparent esophageal caliber transition at the level of the infiltrative central left lung tumor, which appears to encase/invade the thoracic esophagus (series 2/image 34). No axillary adenopathy. No right hilar adenopathy. No discrete pathologically enlarged mediastinal nodes. Lungs/Pleura: There is prominent volume loss in the left hemithorax. There is a moderate left hydropneumothorax with circumferential left pleural thickening and enhancement, with basilar left PleurX catheter well positioned with the tip in posterior mid to upper left pleural space. The left hydropneumothorax is overall significantly decreased in size since 05/06/2017 chest CT. Infiltrative poorly marginated partially necrotic heterogeneously enhancing central left lung mass measures approximately 10.0 x 6.4 cm (series 2/image 36), overall not appreciably changed in size from 04/30/2017 chest CT, where it measured 10.9 x 6.6 cm. The central left lung mass directly invades the left hilum and subcarinal mediastinum. Occlusion of the left upper and left lower lobe bronchi by the mass, with complete left lower lobe atelectasis and limited aeration of the left upper lobe, with bandlike opacities throughout  the largely collapsed left upper lobe, compatible with a combination of atelectasis and postinfectious scarring. The necrotic left lower lobe portion of the tumor appears to directly communicate with the left pleural space (series 2/images 41 and 42). No right pleural effusion. Moderate centrilobular and paraseptal emphysema with diffuse bronchial wall thickening. Stable calcified subcentimeter medial right lower lobe granuloma. No acute consolidative airspace disease or significant pulmonary nodules in the right lung. Musculoskeletal:  No aggressive appearing focal osseous lesions. CT ABDOMEN PELVIS FINDINGS Hepatobiliary: Normal liver with no liver mass. Cholelithiasis. Stable nonspecific wall thickening in the fundal portion of the gallbladder. No pericholecystic fluid. No biliary ductal dilatation. Pancreas: Normal, with no mass or duct dilation. Spleen: Normal size. No mass. Adrenals/Urinary Tract: No discrete adrenal nodules. No hydronephrosis. Simple 2.4 cm lateral lower right renal cyst. Several subcentimeter hypodense renal cortical lesions in both kidneys are too small to characterize. Bladder is completely collapsed by indwelling Foley catheter. New 18 x 10 mm calcification at the right posterior bladder, presumably a bladder stone. Stomach/Bowel: Normal non-distended stomach. Normal caliber small bowel with no small bowel wall thickening. Normal appendix. Mild sigmoid diverticulosis, with no large bowel wall thickening or pericolonic fat stranding. Vascular/Lymphatic: Atherosclerotic abdominal aorta with stable 3.0 cm infrarenal abdominal aortic aneurysm. Patent portal, splenic, hepatic and renal veins. No pathologically enlarged lymph nodes in the abdomen or pelvis. Reproductive: Stable moderately enlarged prostate. Other: No pneumoperitoneum, ascites or focal fluid collection. Musculoskeletal: No aggressive appearing focal osseous lesions. 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  IMPRESSION: 1. Infiltrative partially necrotic central left lung tumor is overall stable in size  since 04/30/2017 chest CT. Persistent occlusion of the central left lung airways with complete left lower lobe atelectasis and near complete left upper lobe scarring/atelectasis. 2. Moderate left hydropneumothorax is significantly decreased in size since 05/06/2017 chest CT with left PleurX catheter in place. Probable direct communication of the necrotic left lower lobe portion of the tumor with the left pleural space. 3. Circumferential left pleural thickening and enhancement compatible with malignant effusion. 4. Findings worrisome for developing malignant stricture in the midthoracic esophagus, with new dilatation of the upper thoracic esophagus, which is filled with oral contrast and debris. Patient is at high risk of aspiration. 5. Otherwise no evidence of new or progressive metastatic disease in the chest. No evidence of metastatic disease in the abdomen, pelvis or skeleton. 6. Stable small pericardial effusion/thickening. 7. New 18 x 10 mm right posterior bladder calcification adjacent to the indwelling Foley catheter, presumably a bladder stone. No hydronephrosis. 8. Aortic Atherosclerosis (ICD10-I70.0) and Emphysema (ICD10-J43.9). Numerous chronic findings as above. These results will be called to the ordering clinician or representative by the Radiologist Assistant, and communication documented in the PACS or zVision Dashboard. Electronically Signed   By: Ilona Sorrel M.D.   On: 07/14/2017 15:19   Dg Chest Port 1 View  Result Date: 07/18/2017 CLINICAL DATA:  Acute onset shortness of breath. Current history of left lung cancer. EXAM: PORTABLE CHEST 1 VIEW COMPARISON:  07/17/2017, 05/21/2017 and earlier, including CT chest 07/14/2017 and earlier. FINDINGS: Near complete opacification of the left hemithorax due to a combination of previously identified central lung cancer, obstructive atelectasis/pneumonitis  involving the left lung and a large loculated pleural effusion (as seen on the prior CT). PleurX catheter in the medial left pleural space. Right lung hyperinflated with prominent bronchovascular markings diffusely and central peribronchial thickening, unchanged. No new abnormalities in the right lung. IMPRESSION: 1. Stable appearance of the nearly completely opacified left hemithorax due to a combination of central malignancy, postobstructive atelectasis/pneumonitis and loculated pleural effusion. PleurX catheter remains in place medially. 2. No acute abnormality involving the right lung.  COPD/emphysema. Electronically Signed   By: Evangeline Dakin M.D.   On: 07/18/2017 11:41   Dg Chest Port 1 View  Result Date: 07/17/2017 CLINICAL DATA:  77 year old male with stage IV lung cancer diagnosed November 2018. Shortness of breath. EXAM: PORTABLE CHEST 1 VIEW COMPARISON:  Restaging CT chest abdomen and pelvis 07/14/2017 and earlier. FINDINGS: Two portable AP semi upright views at 1745 hours. Progressive opacification of the left hemithorax since 05/21/2017, although the left lung ventilation appears only mildly worsened since 07/14/2017. There is subtotal opacification of the left hemithorax now. A percutaneous pleural catheter remains in place. Leftward shift of the mediastinum may have increased along with the opacification of the residual left perihilar lung since 07/14/2017. The left mainstem bronchus is poorly visible. The right lung appears hyperinflated and clear. Visualized tracheal air column is within normal limits. IMPRESSION: 1. Subtotal opacification of the left hemithorax now, with progressive opacification of the small area of residual pneumatized lung since the recent restaging CT 07/14/2017. Given the recent CT appearance, this might reflect a combination of progressive left bronchial compression due to tumor and/or mucous plugging. 2. Stable left pleural drain. 3. Hyperinflated right lung.  Electronically Signed   By: Genevie Ann M.D.   On: 07/17/2017 18:32    DISCHARGE EXAMINATION: Vitals:   07/21/17 0459 07/21/17 1531 07/21/17 2100 07/22/17 0441  BP: 95/66 100/75 94/70 93/63   Pulse: (!) 105 (!) 107 (!) 109 Marland Kitchen)  107  Resp: (!) 24 20 (!) 22 20  Temp: 98.3 F (36.8 C) 98.1 F (36.7 C) 97.8 F (36.6 C) (!) 97 F (36.1 C)  TempSrc: Oral Oral Axillary Axillary  SpO2: 100% 99% 100% 100%  Weight: 61.3 kg (135 lb 2.3 oz)     Height:       General appearance: alert, cooperative, appears stated age and no distress Resp: Diminished air entry at the left base.  Crackles. Cardio: regular rate and rhythm, S1, S2 normal, no murmur, click, rub or gallop GI: soft, non-tender; bowel sounds normal; no masses,  no organomegaly  DISPOSITION: Home with hospice services    Allergies as of 07/22/2017   No Known Allergies     Medication List    STOP taking these medications   ACCU-CHEK FASTCLIX LANCETS Misc   ACCU-CHEK GUIDE test strip Generic drug:  glucose blood   cetirizine 10 MG tablet Commonly known as:  ZYRTEC   furosemide 20 MG tablet Commonly known as:  LASIX   guaiFENesin 100 MG/5ML Soln Commonly known as:  ROBITUSSIN   LORazepam 0.5 MG tablet Commonly known as:  ATIVAN Replaced by:  LORazepam 2 MG/ML concentrated solution   metFORMIN 500 MG tablet Commonly known as:  GLUCOPHAGE   potassium chloride 20 MEQ/15ML (10%) Soln   sulfamethoxazole-trimethoprim 800-160 MG tablet Commonly known as:  BACTRIM DS,SEPTRA DS   tamsulosin 0.4 MG Caps capsule Commonly known as:  FLOMAX     TAKE these medications   acetaminophen 500 MG tablet Commonly known as:  TYLENOL Take 1,000 mg by mouth every 6 (six) hours as needed for moderate pain.   albuterol 108 (90 Base) MCG/ACT inhaler Commonly known as:  PROVENTIL HFA;VENTOLIN HFA Inhale 1 puff every 6 (six) hours as needed into the lungs for wheezing or shortness of breath.   amiodarone 400 MG tablet Commonly known  as:  PACERONE Take 1 tablet (400 mg total) by mouth 2 (two) times daily.   clotrimazole 10 MG troche Commonly known as:  MYCELEX Take 1 tablet (10 mg total) by mouth 5 (five) times daily.   loperamide 2 MG tablet Commonly known as:  IMODIUM A-D Take 2 mg by mouth 3 (three) times daily as needed for diarrhea or loose stools.   LORazepam 2 MG/ML concentrated solution Commonly known as:  ATIVAN Place 0.5 mLs (1 mg total) under the tongue every 4 (four) hours as needed for anxiety. Replaces:  LORazepam 0.5 MG tablet   menthol-cetylpyridinium 3 MG lozenge Commonly known as:  CEPACOL Take 1 lozenge by mouth every 6 (six) hours as needed for sore throat.   morphine 20 MG/5ML solution Take 1.3 mLs (5.2 mg total) by mouth every 2 (two) hours as needed for pain.   ondansetron 4 MG disintegrating tablet Commonly known as:  ZOFRAN-ODT Take 4 mg by mouth every 8 (eight) hours as needed for nausea or vomiting.   prochlorperazine 10 MG tablet Commonly known as:  COMPAZINE Take 1 tablet (10 mg total) by mouth every 6 (six) hours as needed for nausea or vomiting.        Follow-up Information    [facility] Follow up.   Specialty:  Hospice and Palliative Medicine Contact information: [place] [id number] [phone]           TOTAL DISCHARGE TIME: 35 mins  [facility] Pager [phone]  07/22/2017, 3:45 PM

## 2017-07-22 NOTE — Care Management Note (Addendum)
Case Management Note  Patient Details  Name: Maurice Tyler MRN: [id number] Date of Birth: 1940-10-04   This CM confirmed with [facility] that they would be able to provide home 02 at 15L/minute for the hospice company if pt wants to go home. This CM met with pt and daughter Lambert Keto at bedside to give new information and pt was very clear that he would rather go home with hospice. Choice offered and [facility] chosen. Referral called to [facility] office. Pt will need hospital bed for home as well. Medical Necessity form filled out and printed for nursing staff to call [facility] for transport. Pt has a Pleurex catheter in place that is not new. Nursing staff has ordered a box of drainage containers to send home with pt.   Lynnell Catalan, RN 07/22/2017, 10:49 AM [phone]

## 2017-07-23 ENCOUNTER — Ambulatory Visit: Payer: Medicare Other

## 2017-07-23 ENCOUNTER — Ambulatory Visit: Payer: Medicare Other | Admitting: Oncology

## 2017-07-23 ENCOUNTER — Other Ambulatory Visit: Payer: Medicare Other

## 2017-08-03 DEATH — deceased

## 2017-08-04 ENCOUNTER — Ambulatory Visit: Payer: Medicare Other | Admitting: Physician Assistant

## 2017-08-05 ENCOUNTER — Ambulatory Visit: Payer: Medicare Other

## 2017-08-05 ENCOUNTER — Other Ambulatory Visit: Payer: Medicare Other

## 2017-08-05 ENCOUNTER — Ambulatory Visit: Payer: Medicare Other | Admitting: Oncology

## 2017-08-12 ENCOUNTER — Other Ambulatory Visit: Payer: Medicare Other

## 2017-08-12 ENCOUNTER — Ambulatory Visit: Payer: Medicare Other | Admitting: Oncology

## 2017-08-12 ENCOUNTER — Ambulatory Visit: Payer: Medicare Other

## 2017-08-26 ENCOUNTER — Other Ambulatory Visit: Payer: Medicare Other

## 2017-08-26 ENCOUNTER — Ambulatory Visit: Payer: Medicare Other

## 2017-08-26 ENCOUNTER — Ambulatory Visit: Payer: Medicare Other | Admitting: Oncology

## 2017-08-28 LAB — BLOOD GAS, ARTERIAL
ACID-BASE DEFICIT: 0.5 mmol/L (ref 0.0–2.0)
BICARBONATE: 25.4 mmol/L (ref 20.0–28.0)
DELIVERY SYSTEMS: POSITIVE
Drawn by: 51425
EXPIRATORY PAP: 5
FIO2: 50
INSPIRATORY PAP: 10
LHR: 10 {breaths}/min
MODE: POSITIVE
O2 SAT: 97.6 %
Patient temperature: 98.6
pCO2 arterial: 48.3 mmHg — ABNORMAL HIGH (ref 32.0–48.0)
pH, Arterial: 7.34 — ABNORMAL LOW (ref 7.350–7.450)
pO2, Arterial: 119 mmHg — ABNORMAL HIGH (ref 83.0–108.0)

## 2017-09-02 ENCOUNTER — Ambulatory Visit: Payer: Medicare Other | Admitting: Oncology

## 2017-09-02 ENCOUNTER — Ambulatory Visit: Payer: Medicare Other

## 2017-09-02 ENCOUNTER — Other Ambulatory Visit: Payer: Medicare Other

## 2017-09-16 ENCOUNTER — Ambulatory Visit: Payer: Medicare Other | Admitting: Internal Medicine

## 2017-09-16 ENCOUNTER — Other Ambulatory Visit: Payer: Medicare Other

## 2017-09-16 ENCOUNTER — Ambulatory Visit: Payer: Medicare Other

## 2017-09-23 ENCOUNTER — Other Ambulatory Visit: Payer: Medicare Other

## 2017-09-23 ENCOUNTER — Ambulatory Visit: Payer: Medicare Other | Admitting: Internal Medicine

## 2017-09-23 ENCOUNTER — Ambulatory Visit: Payer: Medicare Other

## 2018-05-15 IMAGING — CT CT ANGIO CHEST
2 of 6 series · 18 of 46 positions shown · IV contrast (ISOVUE)
Comparison: Chest radiograph performed earlier today at [DATE] p.m.,
and PET/CT performed 03/23/2017

CLINICAL DATA: Acute onset of shortness of breath, cough and
generalized weakness. Known lung cancer, status post radiation
therapy.

EXAM:
CT ANGIOGRAPHY CHEST WITH CONTRAST
TECHNIQUE: Multidetector CT imaging of the chest was performed using the
standard protocol during bolus administration of intravenous
contrast. Multiplanar CT image reconstructions and MIPs were
obtained to evaluate the vascular anatomy.
CONTRAST:  100mL [id number] IOPAMIDOL ([id number]) INJECTION 76%

[Series 5: thins · axial · 0.83mm/px · z∈[-472,-160]mm · 15 of 342 slices shown]
[im 15/342  lung]
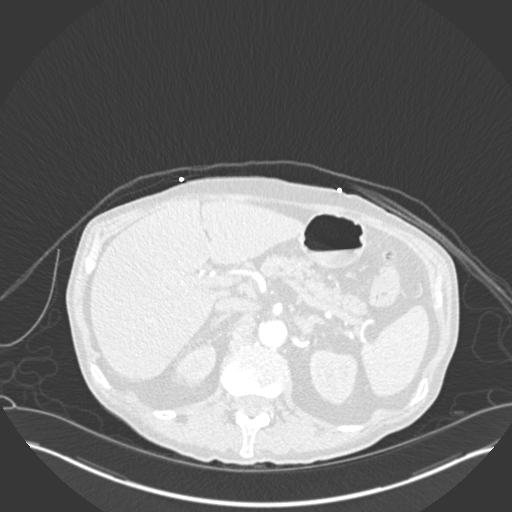
[im 45/342  soft-tissue]
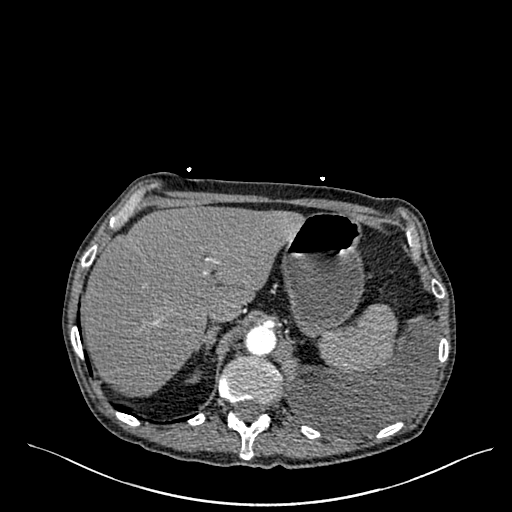
[im 60/342  lung]
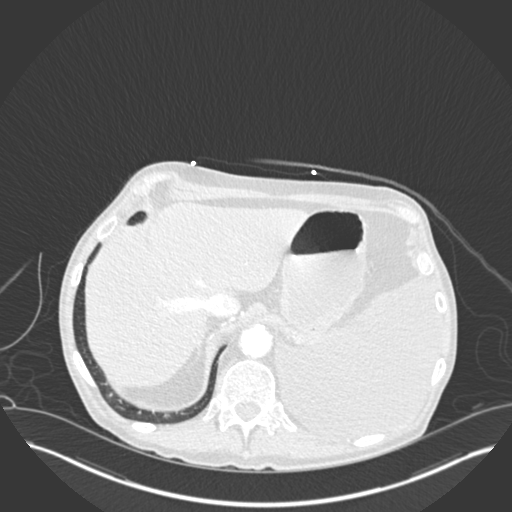
[im 89/342  soft-tissue]
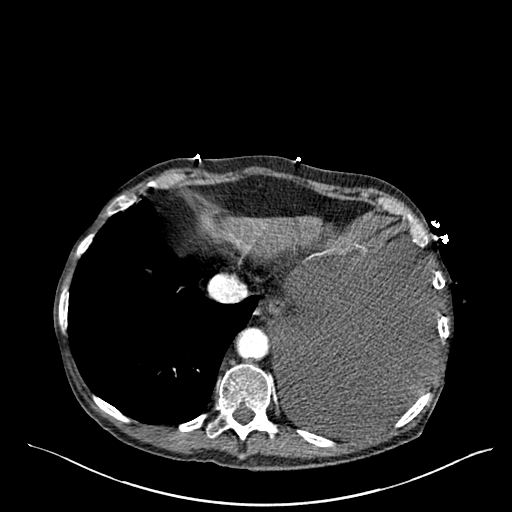
[im 104/342  lung]
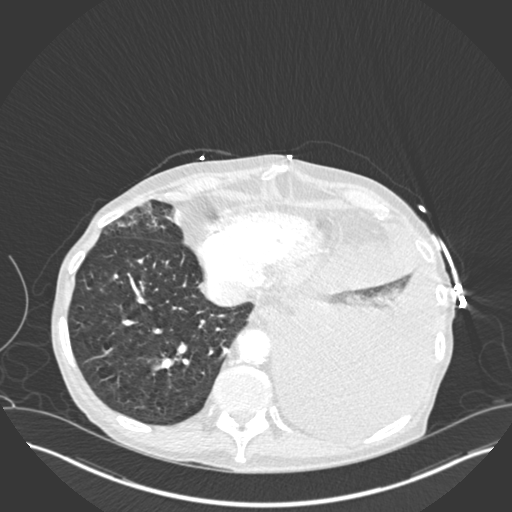
[im 134/342  soft-tissue]
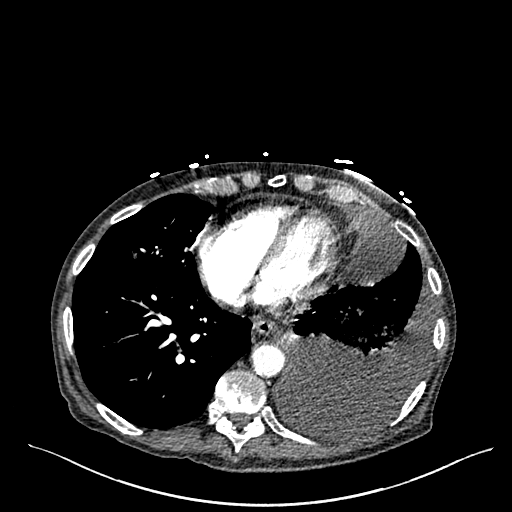
[im 149/342  lung]
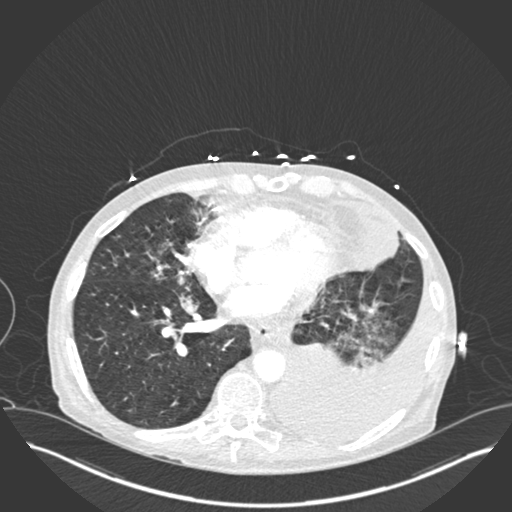
[im 178/342  soft-tissue]
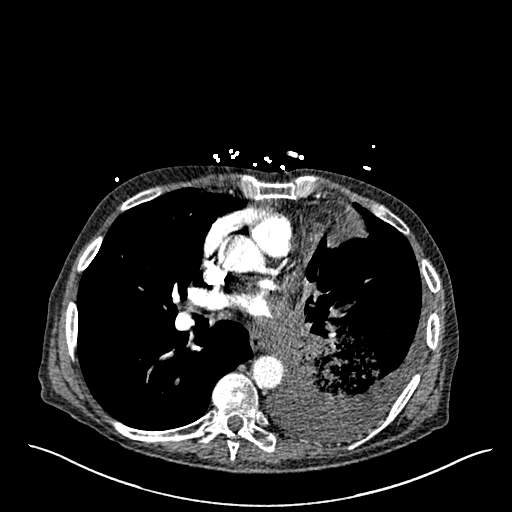
[im 193/342  lung]
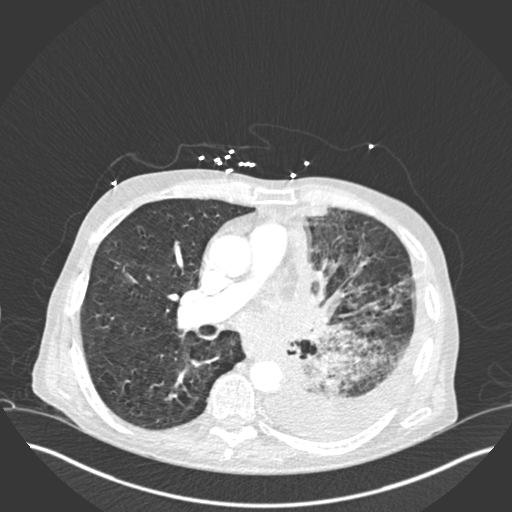
[im 208/342  soft-tissue]
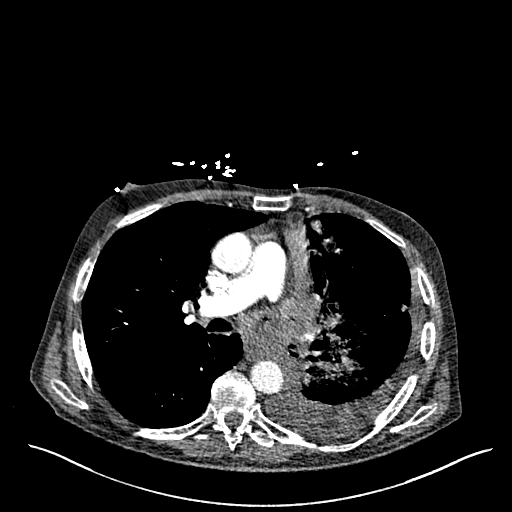
[im 238/342  lung]
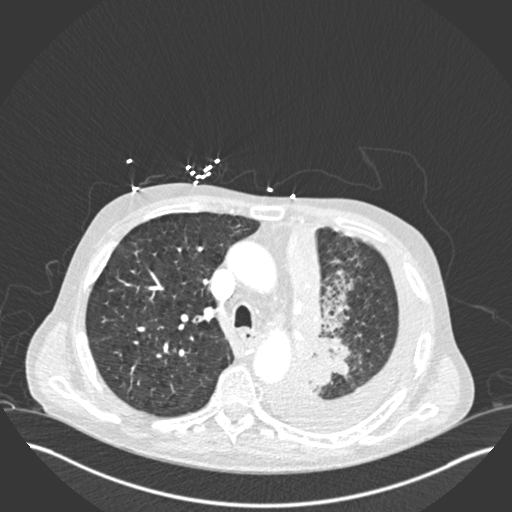
[im 253/342  soft-tissue]
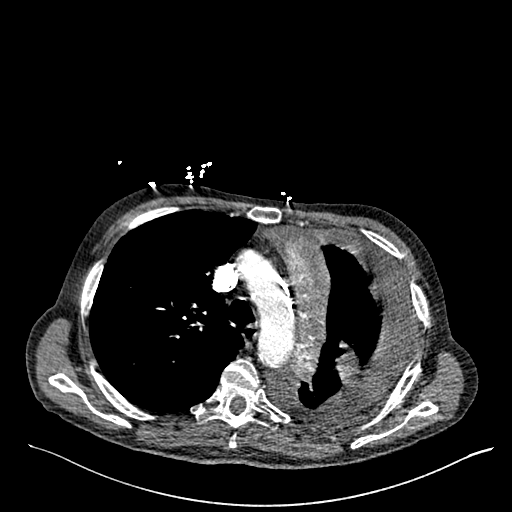
[im 282/342  lung]
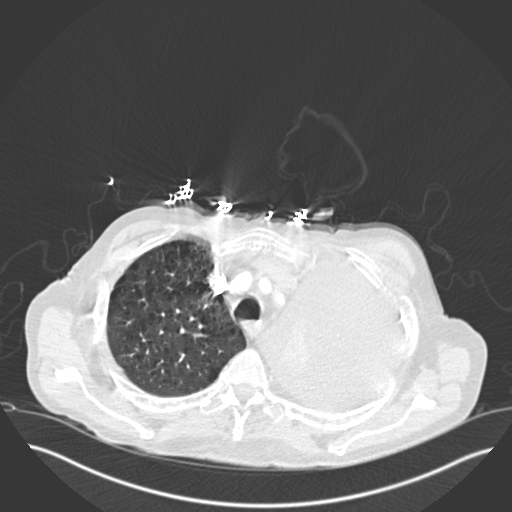
[im 297/342  soft-tissue]
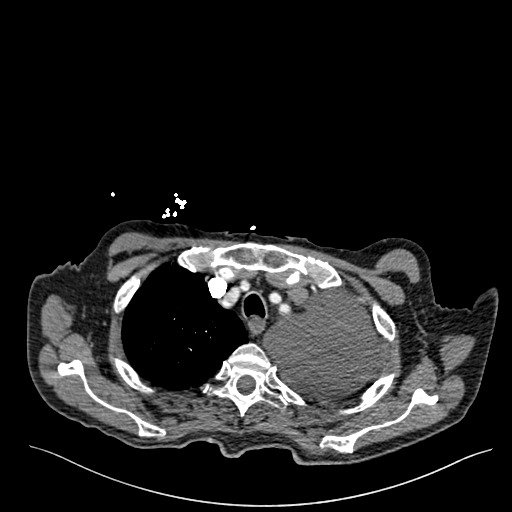
[im 327/342  lung]
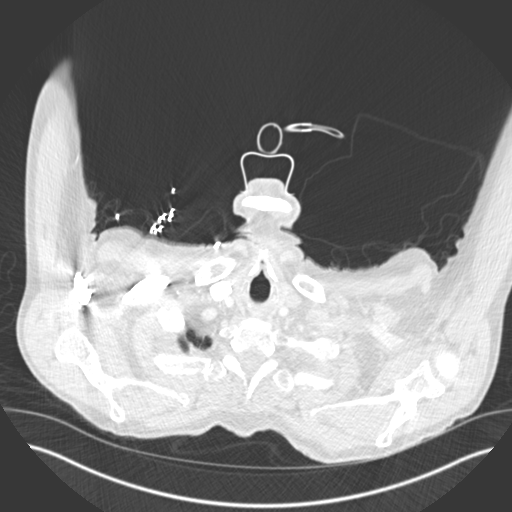

[Series 6: coronal mpr · coronal · 0.67mm/px · 3 of 137 slices shown]
[im 35/137  soft-tissue]
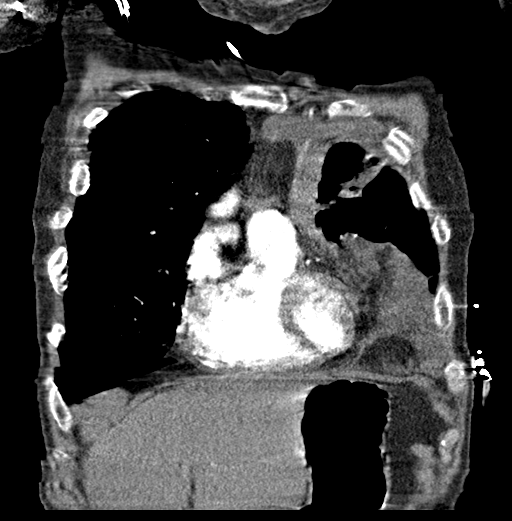
[im 69/137  soft-tissue]
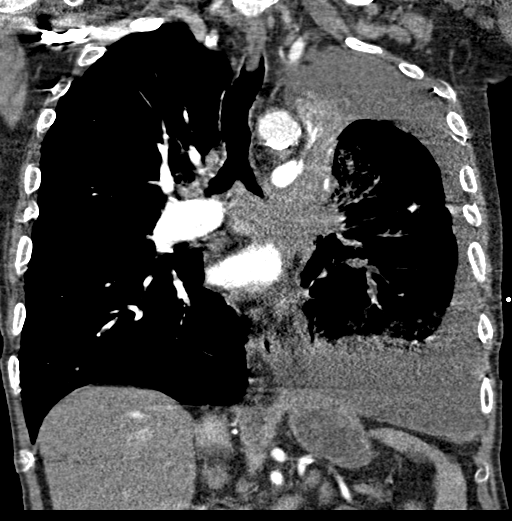
[im 103/137  soft-tissue]
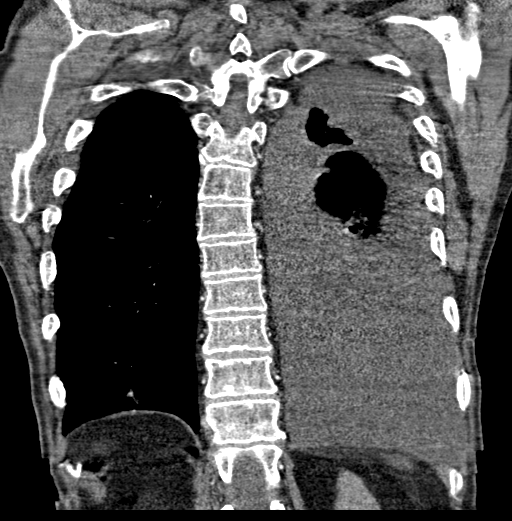

[18 of 46 positions shown; findings below may reference images not displayed]

FINDINGS: Cardiovascular: There is no definite evidence of pulmonary embolus.
Evaluation for pulmonary embolus is suboptimal due to motion
artifact and in areas of airspace consolidation.

The heart is normal in size. Scattered calcification is noted along
the aortic arch. The great vessels are grossly unremarkable in
appearance, aside from mild mural thrombus along the left subclavian
artery.

Mediastinum/Nodes: A large mass is again noted along the left side
of the mediastinum, completely effacing the left mainstem bronchus
and markedly compressing the left main pulmonary artery and its
branches. The mass is difficult to fully characterize due to the
phase of contrast enhancement, but measures at least 6.5 cm size.

Trace pericardial fluid remains within normal limits. No additional
mediastinal lymphadenopathy is seen. The thyroid gland is
unremarkable. No axillary lymphadenopathy is appreciated.

Lungs/Pleura: A moderate to large left-sided pleural effusion is
noted, improved from the prior study. Diffuse airspace opacity is
noted within the left upper and lower lobes, compatible with
pneumonia. Underlying central cavitary lung mass is noted, with
air-fluid levels and nodular extension into the left lung apex.

Patchy airspace opacities within the right middle lobe are also
concerning for pneumonia. Underlying emphysema is noted at the right
upper lobe. No pneumothorax is seen.

Upper Abdomen: The visualized portions of the liver and spleen are
unremarkable. The visualized portions of the pancreas, adrenal
glands and kidneys are within normal limits.

Musculoskeletal: No acute osseous abnormalities are identified. The
visualized musculature is unremarkable in appearance.

Review of the MIP images confirms the above findings.
IMPRESSION: 1. No definite evidence of pulmonary embolus.
2. Diffuse airspace opacities within the left upper and lower lung
lobes, and mild patchy airspace opacities within the right middle
lobe, compatible with multifocal pneumonia.
3. Central cavitary lung mass noted at the left lung, with air-fluid
levels and nodular extension into the left lung apex. Associated
large mass along the left side of the mediastinum, completely
effacing the left mainstem bronchus and markedly compressing the
left main pulmonary artery and its branches. This reflects the
patient's known lung cancer.
4. Moderate to large left-sided pleural effusion is improved from
the prior study.
5. Underlying emphysema noted at the right upper lobe.

## 2018-05-15 IMAGING — CR DG CHEST 2V
2 series · 2 of 2 positions shown · non-contrast
Comparison: 04/08/2017

CLINICAL DATA: Chronic shortness of breath, productive cough

EXAM:
CHEST  2 VIEW

[w chest lat]
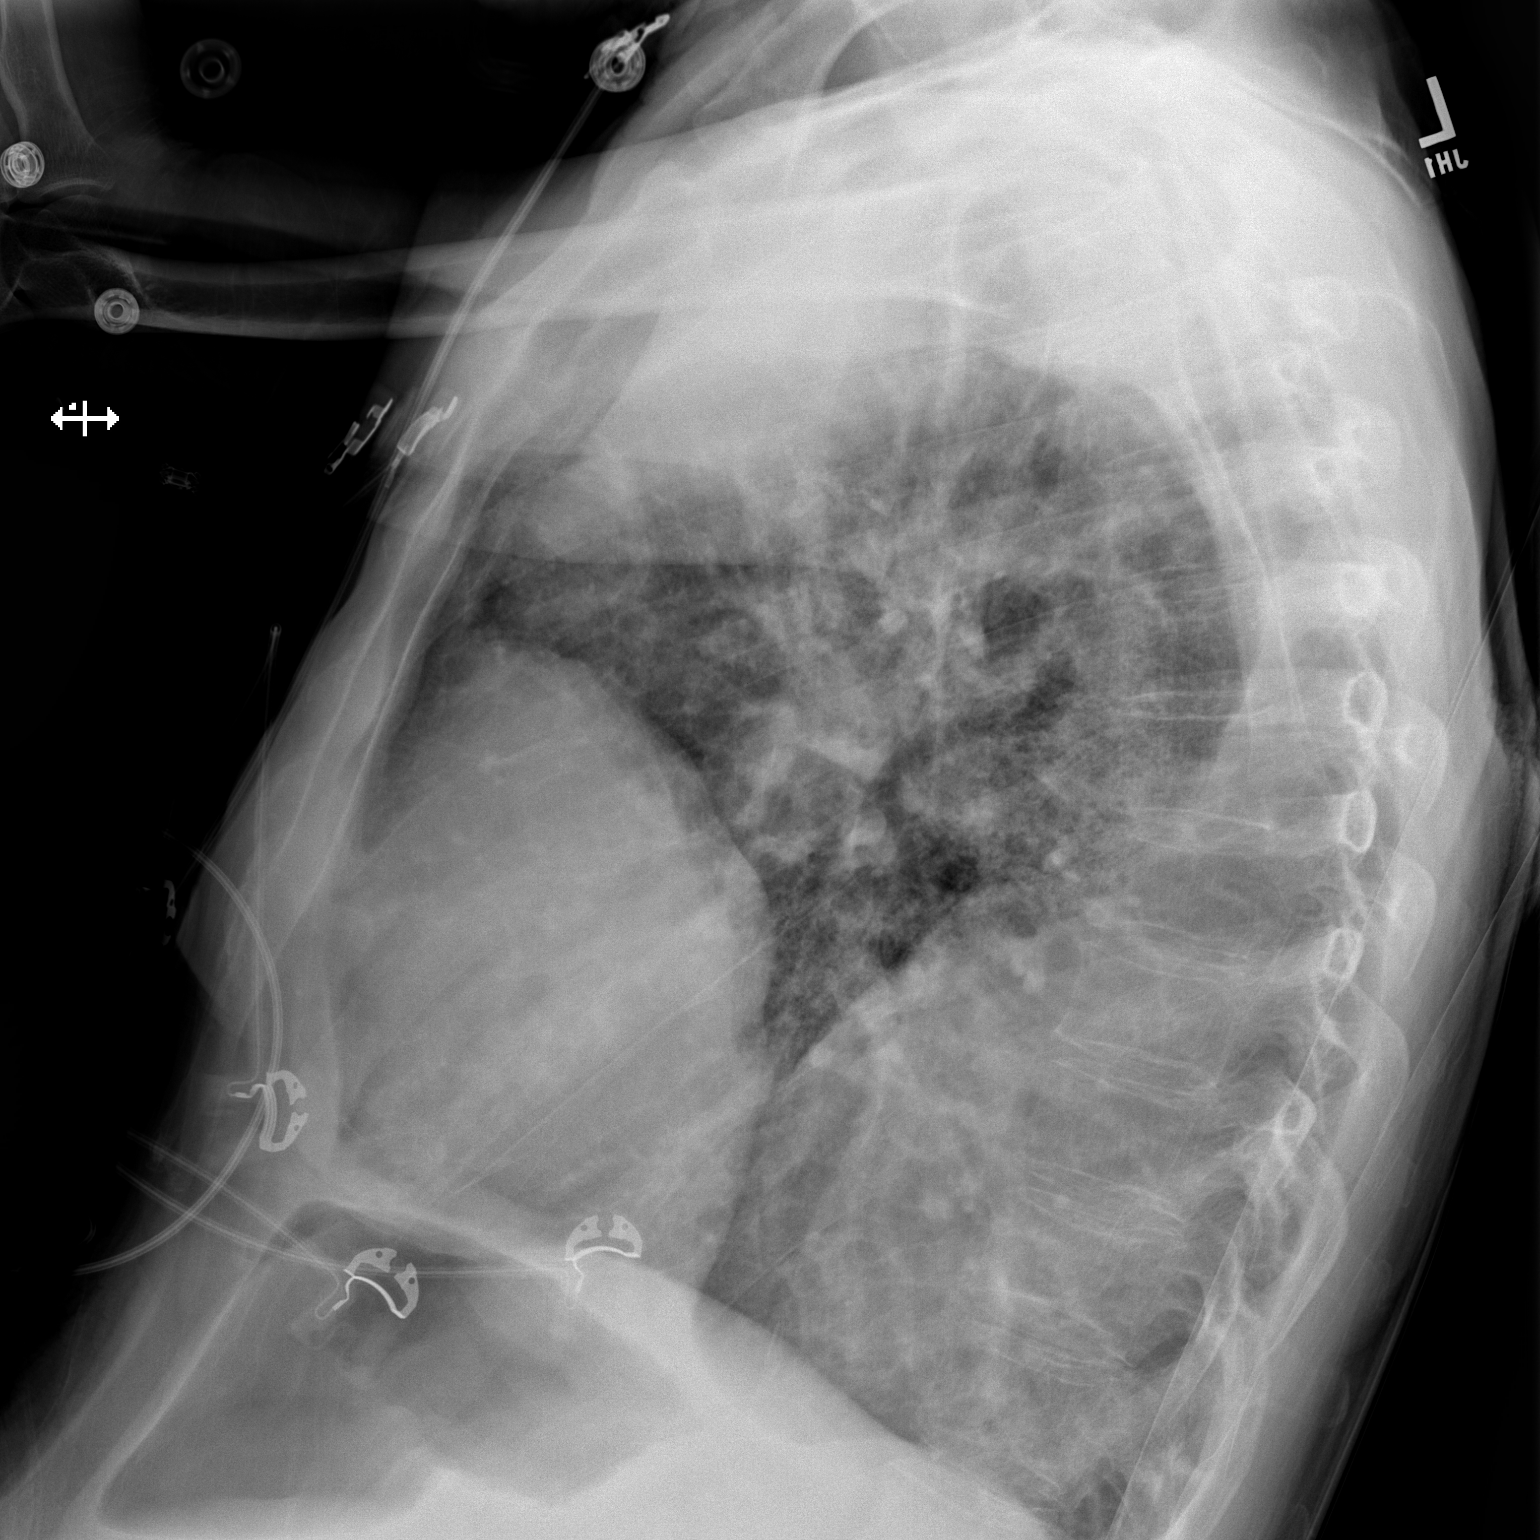

[x chest ap]
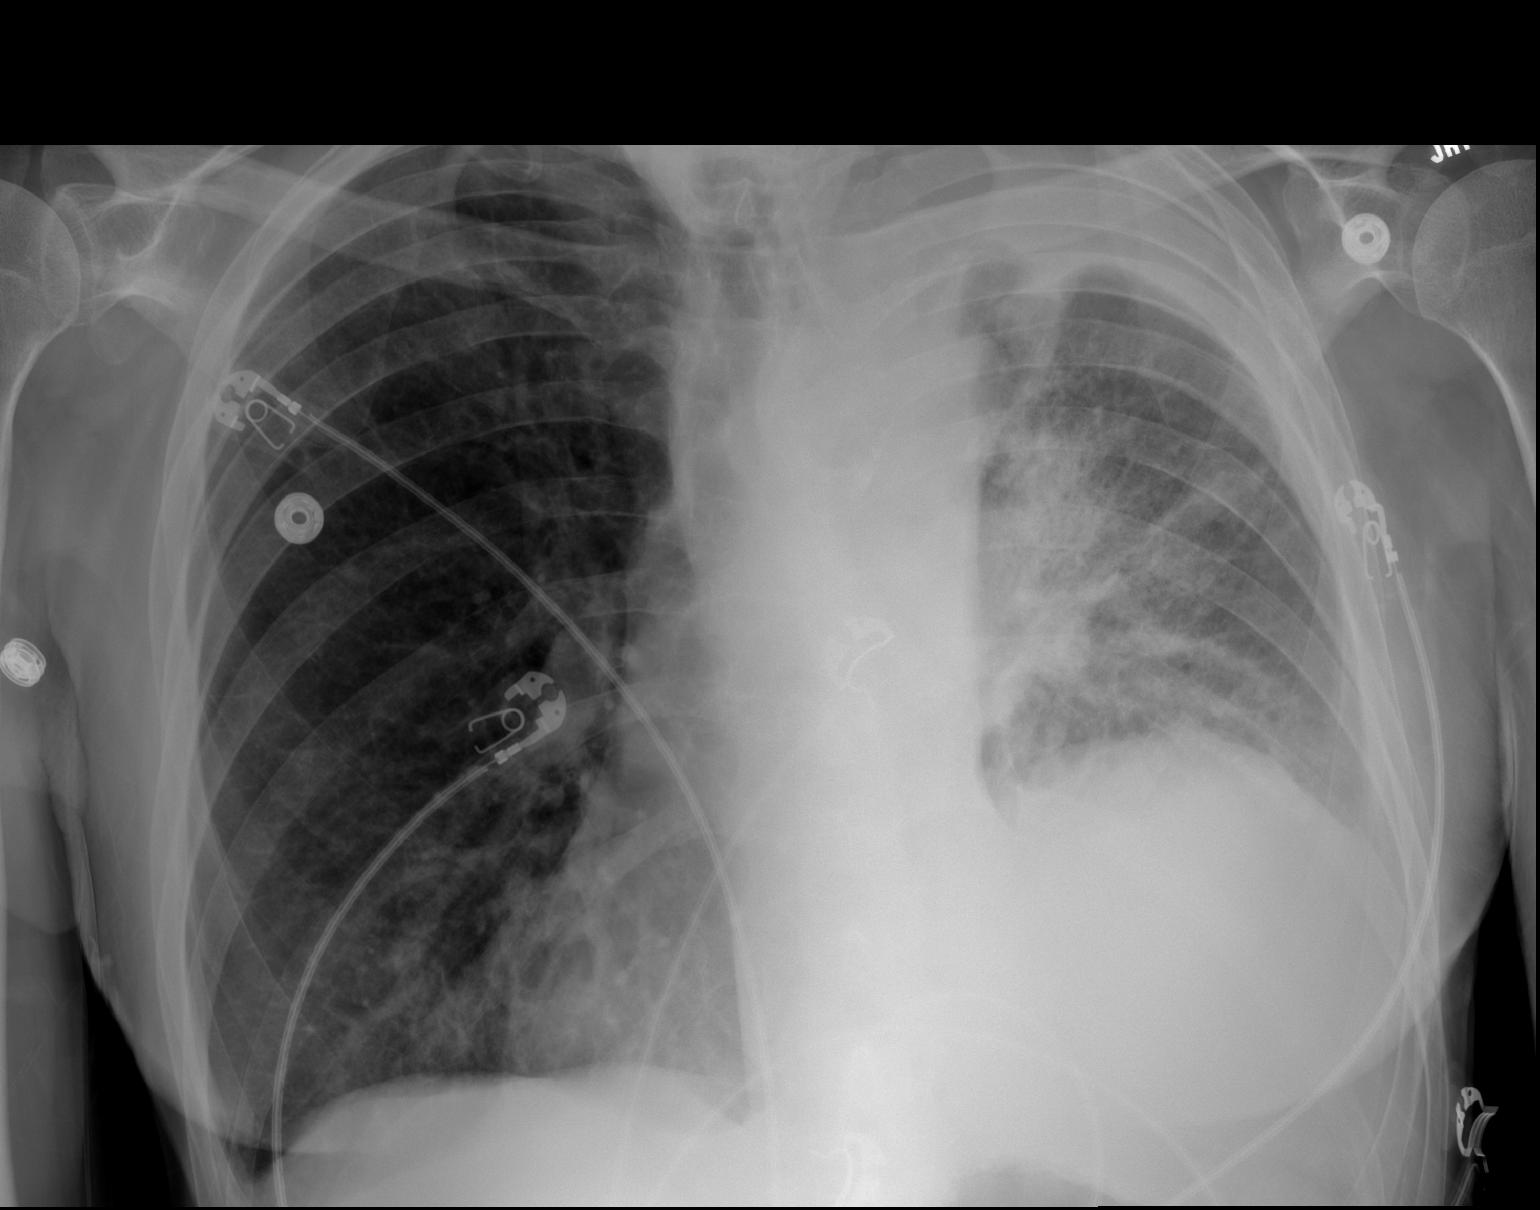

[2 of 2 positions shown; findings below may reference images not displayed]

FINDINGS: Large left perihilar mass. Moderate left pleural effusion which
appears partially loculated. Right lung is clear. Stable
cardiomediastinal silhouette. Thoracic aortic atherosclerosis. No
acute osseous abnormality.
IMPRESSION: Left perihilar soft tissue mass consistent with known malignancy.
Moderate left pleural effusion improved compared with 04/08/2017.

## 2018-05-31 IMAGING — DX DG CHEST 1V
1 series · 1 of 1 positions shown · non-contrast
Comparison: Prior chest x-ray obtained yesterday; CT scan of the
chest 05/06/2017

CLINICAL DATA: 76-year-old male with left-sided chest tube

EXAM:
CHEST 1 VIEW

[chest ap]
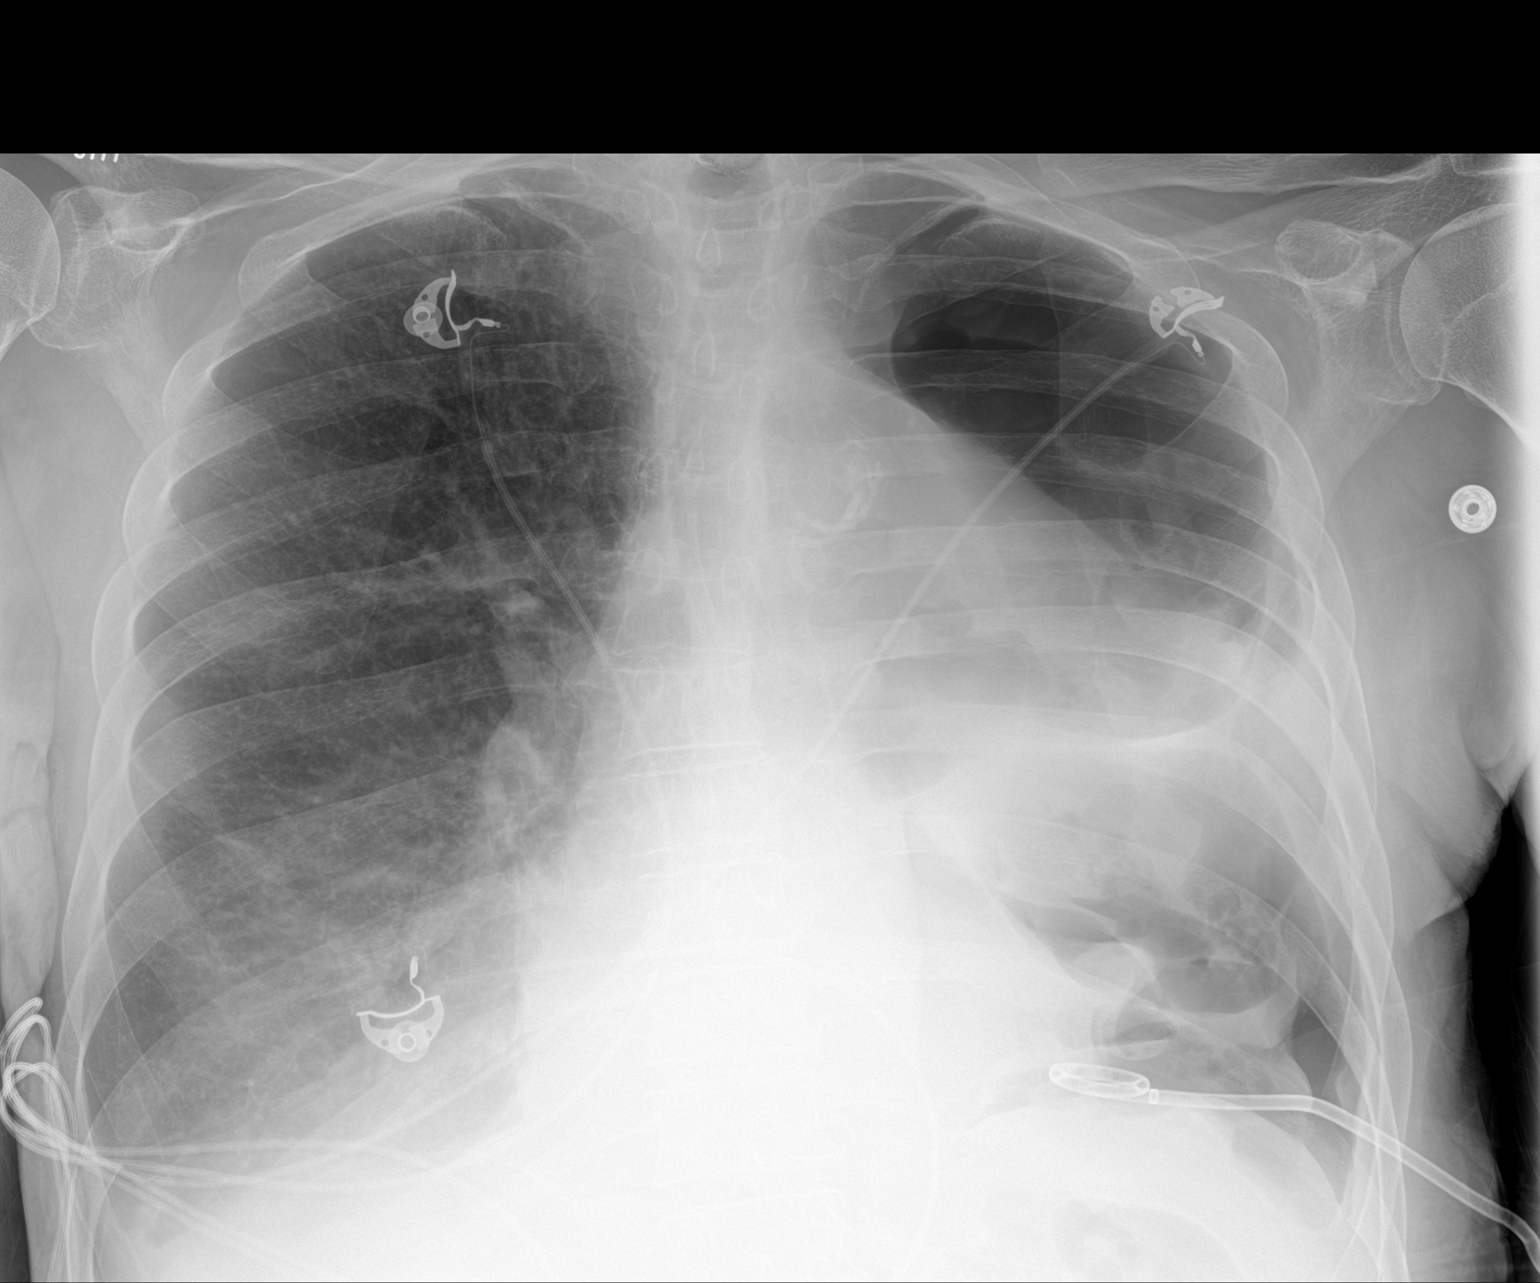

[1 of 1 positions shown; findings below may reference images not displayed]

FINDINGS: Stable position of left basilar percutaneous thoracostomy tube.
Persistent large left-sided loculated pneumothorax. Perhaps minimal
improvement in the basilar component. The entire left lung remains
atelectatic. Stable moderate layering right-sided pleural effusion
with associated right basilar atelectasis. Unchanged cardiac
mediastinal contours. Atherosclerotic calcifications in the
transverse aorta. Background pulmonary emphysema. No acute osseous
abnormality.
IMPRESSION: 1. Stable chest x-ray with left basilar chest tube in place. Minimal
change in the large left-sided loculated pneumothorax which is
likely ex vacuo in nature and due to failure of expansion of the
atelectatic left lung.
2. Stable moderate layering right pleural effusion.
